# Patient Record
Sex: Female | Born: 1990 | Race: Black or African American | Hispanic: No | Marital: Married | Smoking: Former smoker
Health system: Southern US, Community
[De-identification: ages and names within clinical notes are randomized; demographics above are authoritative.]

## PROBLEM LIST (undated history)

## (undated) DIAGNOSIS — R42 Dizziness and giddiness: Secondary | ICD-10-CM

## (undated) DIAGNOSIS — O165 Unspecified maternal hypertension, complicating the puerperium: Secondary | ICD-10-CM

## (undated) DIAGNOSIS — A599 Trichomoniasis, unspecified: Secondary | ICD-10-CM

## (undated) DIAGNOSIS — IMO0001 Reserved for inherently not codable concepts without codable children: Secondary | ICD-10-CM

## (undated) DIAGNOSIS — Z87442 Personal history of urinary calculi: Secondary | ICD-10-CM

## (undated) DIAGNOSIS — R87629 Unspecified abnormal cytological findings in specimens from vagina: Secondary | ICD-10-CM

## (undated) DIAGNOSIS — F329 Major depressive disorder, single episode, unspecified: Secondary | ICD-10-CM

## (undated) DIAGNOSIS — K219 Gastro-esophageal reflux disease without esophagitis: Secondary | ICD-10-CM

## (undated) DIAGNOSIS — O23592 Infection of other part of genital tract in pregnancy, second trimester: Secondary | ICD-10-CM

## (undated) DIAGNOSIS — J45909 Unspecified asthma, uncomplicated: Secondary | ICD-10-CM

## (undated) DIAGNOSIS — D649 Anemia, unspecified: Secondary | ICD-10-CM

## (undated) DIAGNOSIS — O1405 Mild to moderate pre-eclampsia, complicating the puerperium: Secondary | ICD-10-CM

## (undated) DIAGNOSIS — R110 Nausea: Secondary | ICD-10-CM

## (undated) DIAGNOSIS — Z8619 Personal history of other infectious and parasitic diseases: Secondary | ICD-10-CM

## (undated) DIAGNOSIS — F17200 Nicotine dependence, unspecified, uncomplicated: Secondary | ICD-10-CM

## (undated) DIAGNOSIS — F32A Depression, unspecified: Secondary | ICD-10-CM

## (undated) DIAGNOSIS — A5901 Trichomonal vulvovaginitis: Secondary | ICD-10-CM

## (undated) DIAGNOSIS — I10 Essential (primary) hypertension: Secondary | ICD-10-CM

## (undated) HISTORY — DX: Unspecified maternal hypertension, complicating the puerperium: O16.5

## (undated) HISTORY — PX: ESOPHAGOGASTRODUODENOSCOPY ENDOSCOPY: SHX5814

## (undated) HISTORY — DX: Nicotine dependence, unspecified, uncomplicated: F17.200

## (undated) HISTORY — PX: DILATION AND CURETTAGE OF UTERUS: SHX78

## (undated) HISTORY — DX: Nausea: R11.0

## (undated) HISTORY — DX: Essential (primary) hypertension: I10

---
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
## 2008-06-05 ENCOUNTER — Emergency Department (HOSPITAL_COMMUNITY): Admission: EM | Admit: 2008-06-05 | Discharge: 2008-06-05 | Payer: Self-pay | Admitting: Emergency Medicine

## 2008-09-13 ENCOUNTER — Emergency Department (HOSPITAL_COMMUNITY): Admission: EM | Admit: 2008-09-13 | Discharge: 2008-09-13 | Payer: Self-pay | Admitting: Emergency Medicine

## 2008-11-13 ENCOUNTER — Emergency Department (HOSPITAL_COMMUNITY): Admission: EM | Admit: 2008-11-13 | Discharge: 2008-11-13 | Payer: Self-pay | Admitting: Emergency Medicine

## 2009-04-29 ENCOUNTER — Emergency Department (HOSPITAL_COMMUNITY): Admission: EM | Admit: 2009-04-29 | Discharge: 2009-04-30 | Payer: Self-pay | Admitting: Emergency Medicine

## 2009-07-18 ENCOUNTER — Emergency Department (HOSPITAL_COMMUNITY): Admission: EM | Admit: 2009-07-18 | Discharge: 2009-07-18 | Payer: Self-pay | Admitting: Emergency Medicine

## 2009-09-10 ENCOUNTER — Emergency Department (HOSPITAL_COMMUNITY): Admission: EM | Admit: 2009-09-10 | Discharge: 2009-09-11 | Payer: Self-pay | Admitting: Emergency Medicine

## 2010-01-09 ENCOUNTER — Emergency Department (HOSPITAL_COMMUNITY): Admission: EM | Admit: 2010-01-09 | Discharge: 2010-01-09 | Payer: Self-pay | Admitting: Emergency Medicine

## 2010-04-15 ENCOUNTER — Emergency Department (HOSPITAL_COMMUNITY): Admission: EM | Admit: 2010-04-15 | Discharge: 2010-04-15 | Payer: Self-pay | Admitting: Emergency Medicine

## 2010-04-22 ENCOUNTER — Emergency Department (HOSPITAL_COMMUNITY): Admission: EM | Admit: 2010-04-22 | Discharge: 2010-04-23 | Payer: Self-pay | Admitting: Emergency Medicine

## 2010-07-01 ENCOUNTER — Emergency Department (HOSPITAL_COMMUNITY): Admission: EM | Admit: 2010-07-01 | Discharge: 2010-07-01 | Payer: Self-pay | Admitting: Emergency Medicine

## 2010-08-12 ENCOUNTER — Emergency Department (HOSPITAL_COMMUNITY): Admission: EM | Admit: 2010-08-12 | Discharge: 2010-08-13 | Payer: Self-pay | Admitting: Emergency Medicine

## 2010-09-19 ENCOUNTER — Emergency Department (HOSPITAL_COMMUNITY)
Admission: EM | Admit: 2010-09-19 | Discharge: 2010-09-19 | Payer: Self-pay | Source: Home / Self Care | Admitting: Emergency Medicine

## 2010-10-29 ENCOUNTER — Emergency Department (HOSPITAL_COMMUNITY)
Admission: EM | Admit: 2010-10-29 | Discharge: 2010-10-29 | Payer: Self-pay | Source: Home / Self Care | Admitting: Emergency Medicine

## 2010-12-19 LAB — URINALYSIS, ROUTINE W REFLEX MICROSCOPIC
Hgb urine dipstick: NEGATIVE
Ketones, ur: NEGATIVE mg/dL
Nitrite: NEGATIVE
Specific Gravity, Urine: >1.03 — ABNORMAL HIGH (ref 1.005–1.030)
Urobilinogen, UA: 1 mg/dL (ref 0.0–1.0)
pH: 6.5 (ref 5.0–8.0)

## 2010-12-21 LAB — PREGNANCY, URINE: Preg Test, Ur: NEGATIVE

## 2010-12-21 LAB — URINE MICROSCOPIC-ADD ON

## 2010-12-21 LAB — URINALYSIS, ROUTINE W REFLEX MICROSCOPIC: Bilirubin Urine: NEGATIVE

## 2010-12-25 LAB — WET PREP, GENITAL: Yeast Wet Prep HPF POC: NONE SEEN

## 2010-12-25 LAB — URINALYSIS, ROUTINE W REFLEX MICROSCOPIC
Hgb urine dipstick: NEGATIVE
Protein, ur: NEGATIVE mg/dL
Urobilinogen, UA: 0.2 mg/dL (ref 0.0–1.0)

## 2010-12-25 LAB — GC/CHLAMYDIA PROBE AMP, GENITAL
Chlamydia, DNA Probe: NEGATIVE
GC Probe Amp, Genital: NEGATIVE

## 2010-12-28 ENCOUNTER — Emergency Department (HOSPITAL_COMMUNITY): Payer: Medicaid Other

## 2010-12-28 ENCOUNTER — Emergency Department (HOSPITAL_COMMUNITY)
Admission: EM | Admit: 2010-12-28 | Discharge: 2010-12-28 | Disposition: A | Discharge status: Home / Self Care | Payer: Medicaid Other | Attending: Emergency Medicine | Admitting: Emergency Medicine

## 2010-12-28 DIAGNOSIS — R51 Headache: Secondary | ICD-10-CM | POA: Insufficient documentation

## 2010-12-28 DIAGNOSIS — S0990XA Unspecified injury of head, initial encounter: Secondary | ICD-10-CM | POA: Insufficient documentation

## 2010-12-28 DIAGNOSIS — R071 Chest pain on breathing: Secondary | ICD-10-CM | POA: Insufficient documentation

## 2011-01-12 LAB — BASIC METABOLIC PANEL
BUN: 7 mg/dL (ref 6–23)
CO2: 27 mEq/L (ref 19–32)
Glucose, Bld: 94 mg/dL (ref 70–99)
Sodium: 141 mEq/L (ref 135–145)

## 2011-01-12 LAB — CBC
HCT: 24.3 % — ABNORMAL LOW (ref 36.0–46.0)
MCHC: 34.7 g/dL (ref 30.0–36.0)
Platelets: 279 10*3/uL (ref 150–400)
RDW: 14.9 % (ref 11.5–15.5)

## 2011-01-12 LAB — URINALYSIS, ROUTINE W REFLEX MICROSCOPIC
Ketones, ur: NEGATIVE mg/dL
Protein, ur: NEGATIVE mg/dL
Specific Gravity, Urine: 1.015 (ref 1.005–1.030)
Urobilinogen, UA: 1 mg/dL (ref 0.0–1.0)
pH: 7 (ref 5.0–8.0)

## 2011-01-12 LAB — DIFFERENTIAL
Basophils Absolute: 0 10*3/uL (ref 0.0–0.1)
Basophils Relative: 0 % (ref 0–1)
Eosinophils Absolute: 0 10*3/uL (ref 0.0–0.7)
Eosinophils Relative: 1 % (ref 0–5)
Monocytes Absolute: 0.5 10*3/uL (ref 0.1–1.0)

## 2011-01-21 LAB — CBC
Platelets: 197 10*3/uL (ref 150–400)
RDW: 14.1 % (ref 11.5–15.5)
WBC: 7.2 10*3/uL (ref 4.0–10.5)

## 2011-01-21 LAB — URINALYSIS, ROUTINE W REFLEX MICROSCOPIC
Ketones, ur: NEGATIVE mg/dL
Nitrite: NEGATIVE
Protein, ur: NEGATIVE mg/dL
Urobilinogen, UA: 0.2 mg/dL (ref 0.0–1.0)
pH: 6 (ref 5.0–8.0)

## 2011-01-21 LAB — DIFFERENTIAL
Basophils Absolute: 0 10*3/uL (ref 0.0–0.1)
Lymphocytes Relative: 11 % — ABNORMAL LOW (ref 12–46)
Lymphs Abs: 0.8 10*3/uL (ref 0.7–4.0)
Neutro Abs: 5.9 10*3/uL (ref 1.7–7.7)
Neutrophils Relative %: 82 % — ABNORMAL HIGH (ref 43–77)

## 2011-01-21 LAB — BASIC METABOLIC PANEL
BUN: 6 mg/dL (ref 6–23)
Calcium: 9.1 mg/dL (ref 8.4–10.5)
Creatinine, Ser: 0.47 mg/dL (ref 0.4–1.2)
GFR calc non Af Amer: >60 mL/min (ref >60)
Glucose, Bld: 88 mg/dL (ref 70–99)
Potassium: 3.4 mEq/L — ABNORMAL LOW (ref 3.5–5.1)

## 2011-02-20 ENCOUNTER — Ambulatory Visit (INDEPENDENT_AMBULATORY_CARE_PROVIDER_SITE_OTHER): Payer: Medicaid Other | Admitting: Gastroenterology

## 2011-02-20 ENCOUNTER — Encounter: Payer: Self-pay | Admitting: Gastroenterology

## 2011-02-20 DIAGNOSIS — K920 Hematemesis: Secondary | ICD-10-CM

## 2011-02-20 DIAGNOSIS — R1013 Epigastric pain: Secondary | ICD-10-CM

## 2011-02-20 MED ORDER — ESOMEPRAZOLE MAGNESIUM 40 MG PO CPDR: 40.0000 mg | DELAYED_RELEASE_CAPSULE | Freq: Every day | ORAL | Status: DC

## 2011-02-20 NOTE — Progress Notes (Signed)
Primary Care Physician:  Purcell Nails, MD  Primary Gastroenterologist:  Roetta Sessions, MD  Chief Complaint  Patient presents with  . Hematemesis    HPI:  Monica Cox is a 20 y.o. female here for further evaluation of intermittent hematemesis and epigastric pain. She states she had N/V throughout pregnancy and for four months afterwards. Son turns two in July. Last episode of hematemesis was 3 weeks ago. Moderate volume bright red. Epigastric burning frequently and worse with spicey foods. No dysphagia. BM about 1-2 per week (chronically). No melena, brbpr. Nexium for three days seemed to help stomach.  Current Outpatient Prescriptions  Medication Sig Dispense Refill  . Albuterol Sulfate (VENTOLIN HFA IN) Inhale into the lungs as needed.        Marland Kitchen amoxicillin (AMOXIL) 875 MG tablet Take 875 mg by mouth 2 (two) times daily. 875-125 mg       . Fluticasone-Salmeterol (ADVAIR DISKUS) 250-50 MCG/DOSE AEPB Inhale 1 puff into the lungs as needed.        Marland Kitchen esomeprazole (NEXIUM) 40 MG capsule Take 1 capsule (40 mg total) by mouth daily before breakfast.  30 capsule  3      Allergies as of 02/20/2011  . (No Known Allergies)    Past Medical History  Diagnosis Date  . Asthma     Past Surgical History  Procedure Date  . None     Family History  Problem Relation Age of Onset  . Ulcers Father   . Ulcers Paternal Grandmother   . Ulcers Paternal Aunt   . Liver disease Neg Hx   . Colon cancer Neg Hx     History   Social History  . Marital Status: Married    Spouse Name: N/A    Number of Children: 1  . Years of Education: N/A   Occupational History  . [employer]     respiratory therapy   Social History Main Topics  . Smoking status: Never Smoker   . Smokeless tobacco: Not on file  . Alcohol Use: No  . Drug Use: No  . Sexually Active: Not on file      ROS:  General: Negative for anorexia, weight loss, fever, chills, fatigue, weakness. Eyes: Negative for vision  changes.  ENT: Negative for hoarseness, difficulty swallowing , nasal congestion. CV: Negative for chest pain, angina, palpitations, dyspnea on exertion, peripheral edema.  Respiratory: Negative for dyspnea at rest, dyspnea on exertion, cough, sputum, wheezing.  GI: See history of present illness. GU:  Negative for dysuria, hematuria, urinary incontinence, urinary frequency, nocturnal urination.  MS: Negative for joint pain, low back pain.  Derm: Negative for rash or itching.  Neuro: Negative for weakness, abnormal sensation, seizure, frequent headaches, memory loss, confusion.  Psych: Negative for anxiety, depression, suicidal ideation, hallucinations.  Endo: Negative for unusual weight change.  Heme: Negative for bruising or bleeding. Allergy: Negative for rash or hives.    Physical Examination:  BP 111/72  Pulse 81  Temp(Src) 98.4 F (36.9 C) (Temporal)  Ht 5\' 3"  (1.6 m)  Wt 168 lb (76.204 kg)  BMI 29.76 kg/m2   General: Well-nourished, well-developed in no acute distress.  Head: Normocephalic, atraumatic.   Eyes: Conjunctiva pink, no icterus. Mouth: Oropharyngeal mucosa moist and pink , no lesions erythema or exudate. Neck: Supple without thyromegaly, masses, or lymphadenopathy.  Lungs: Clear to auscultation bilaterally.  Heart: Regular rate and rhythm, no murmurs rubs or gallops.  Abdomen: Bowel sounds are normal, moderate epigastric tenderness, nondistended, no hepatosplenomegaly or  masses, no abdominal bruits or    hernia , no rebound or guarding.   Extremities: No lower extremity edema.  Neuro: Alert and oriented x 4 , grossly normal neurologically.  Skin: Warm and dry, no rash or jaundice.   Psych: Alert and cooperative, normal mood and affect.

## 2011-02-20 NOTE — Progress Notes (Signed)
Cc to PCP 

## 2011-02-20 NOTE — Assessment & Plan Note (Signed)
Moderate volume hematemesis intermittently. Last time 3 weeks ago. Patient states her current hemoglobin was 12.8. She complains of epigastric pain associated with vomiting related to foods. Worse with spicy or greasy foods. Denies typical heartburn. Her symptoms are likely due to this resolved her reflux disease, gastritis, less likely peptic ulcer disease. Likely has hematemesis due to Mallory-Weiss tear. Cannot exclude underlying gallbladder disease as a cause of her intermittent nausea and vomiting however.   Recommend EGD for further evaluation. I have discussed the risks, alternatives, benefits with regards to but not limited to the risk of reaction to medication, bleeding, infection, perforation and the patient is agreeable to proceed. Written consent to be obtained. Will begin Nexium 40 mg daily. #20 samples provided as well as a prescription was sent to [facility].  Will request most recent labs done through Dr. Isidoro Donning office.

## 2011-02-21 NOTE — Op Note (Signed)
NAMEJYRA, LAGARES              ACCOUNT NO.:  [id number]   MEDICAL RECORD NO.:  [id number]          PATIENT TYPE:  AMB   LOCATION:  DAY                           FACILITY:  [facility]   PHYSICIAN:  Lazaro Arms, M.D.   DATE OF BIRTH:  12/07/1990   DATE OF PROCEDURE:  11/26/2006  DATE OF DISCHARGE:  11/26/2006                               OPERATIVE REPORT   PREOPERATIVE DIAGNOSIS:  1. Intrauterine pregnancy at nine weeks.  2. Missed abortion.   POSTOPERATIVE DIAGNOSIS:  1. Intrauterine pregnancy at nine weeks.  2. Missed abortion.   PROCEDURE:  Cervical dilation with uterine evacuation of a nine week  missed abortion.   SURGEON:  Lazaro Arms, M.D.   ANESTHESIA:  General endotracheal anesthesia.   FINDINGS:  The patient was seen in the office, had a nine weeks size  intrauterine pole, had had positive fetal heart activity before, but no  fetal heart rate today by ultrasound.  She requested a D&C.   DESCRIPTION OF PROCEDURE:  The patient was taken to the operating room  and placed in the supine position where she underwent general  endotracheal anesthesia.  She was then placed in the dorsal lithotomy  position and prepped and draped in the usual sterile fashion.  A  speculum was placed, the cervix was grasped, it was dilated serially to  allow passes of 9 curved suction curet.  Several passes were made.  A  good uterine cry was obtained.  All tissue was removed.  Bleeding was  appropriate.  She was awakened from anesthesia and taken to the recovery  room in good, stable condition.  All counts were correct x3.      Lazaro Arms, M.D.  Electronically Signed     LHE/MEDQ  D:  12/29/2006  T:  12/29/2006  Job:  [id number]

## 2011-02-25 ENCOUNTER — Ambulatory Visit (HOSPITAL_COMMUNITY)
Admission: RE | Admit: 2011-02-25 | Discharge: 2011-02-25 | Disposition: A | Discharge status: Home / Self Care | Payer: Medicaid Other | Source: Ambulatory Visit | Attending: Internal Medicine | Admitting: Internal Medicine

## 2011-02-25 ENCOUNTER — Encounter: Payer: Medicaid Other | Admitting: Internal Medicine

## 2011-02-25 DIAGNOSIS — K920 Hematemesis: Secondary | ICD-10-CM | POA: Insufficient documentation

## 2011-02-25 DIAGNOSIS — Z331 Pregnant state, incidental: Secondary | ICD-10-CM

## 2011-02-25 DIAGNOSIS — R1013 Epigastric pain: Secondary | ICD-10-CM | POA: Insufficient documentation

## 2011-02-25 LAB — PREGNANCY, URINE: Preg Test, Ur: NEGATIVE

## 2011-02-28 NOTE — Op Note (Signed)
  Monica Cox, Monica Cox             ACCOUNT NO.:  [id number]  MEDICAL RECORD NO.:  [id number]           PATIENT TYPE:  O  LOCATION:  DAYP                          FACILITY:  [facility]  PHYSICIAN:  R. Roetta Sessions, M.D. DATE OF BIRTH:  02/14/1991  DATE OF PROCEDURE:  02/25/2011 DATE OF DISCHARGE:                              OPERATIVE REPORT   INDICATIONS FOR PROCEDURE:  A 20 year old lady with intermittent epigastric pain and hematemesis and nausea and vomiting in pregnancy several months afterward and has continued intermittently, not much in the way being typical reflux symptoms.  She was started on Nexium 40 mg orally daily recently which has been associated with resolution of her symptoms.  She had normal hemoglobin recently.  No dysphagia, no nausea, vomiting, no abdominal pain whatsoever.  No odynophagia.  She feels well.  EGD is now being done to further evaluate her reported hematemesis.  Risks, benefits, limitations, alternatives, and imponderables have been reviewed and questions have been answered. Please see the documentation in the medical record.  PROCEDURE NOTE:  O2 saturation, blood pressure, pulse, and respirations were monitored throughout the entirety of the procedure.  CONSCIOUS SEDATION:  Versed 7 mg IV, Demerol 100 mg IV in divided doses.  INSTRUMENT:  Pentax video chip system.  FINDINGS:  Examination of the tubular esophagus revealed normal mucosa. EG junction easily traversed. Stomach:  Gastric cavity was emptied and insufflated well with air. Thorough examination of the gastric mucosa including retroflexed proximal stomach, esophagogastric junction demonstrated no abnormalities.  Pylorus was patent, easily traversed.  Examination of the bulb second, third portion revealed no abnormalities.  THERAPEUTIC/DIAGNOSTIC MANEUVERS PERFORMED:  None.  The patient tolerated the procedure well, was reactive in endoscopy.  IMPRESSION: 1. Normal esophagus and  stomach, D1 through D3. 2. Suspect trivial hematemesis.  Symptoms have resolved, at least temporally associated with a course of acid suppression therapy empirically.  I suspect her recent symptoms have been a somewhat atypical manifestation of GERD.  At any rate, findings on today's exam are very, very reassuring.  RECOMMENDATIONS: 1. Reflux literature provided Mr. Karl Ito. 2. Continue Nexium 40 mg orally daily for the next 3 months and then     attempt to taper.  No further GI evaluation warranted at this time.     Jonathon Bellows, M.D.     RMR/MEDQ  D:  02/25/2011  T:  02/26/2011  Job:  [id number]  cc:   Purcell Nails, MD Fax: [phone]  Electronically Signed by Lorrin Goodell M.D. on 02/28/2011 01:48:15 PM

## 2011-03-07 NOTE — Progress Notes (Signed)
agree

## 2011-03-10 LAB — CBC
HCT: 39 %
WBC: 6.1
platelet count: 266

## 2011-03-26 ENCOUNTER — Telehealth: Payer: Self-pay | Admitting: Gastroenterology

## 2011-03-26 NOTE — Telephone Encounter (Signed)
LMOM to call.

## 2011-03-26 NOTE — Telephone Encounter (Signed)
Pt informed

## 2011-03-26 NOTE — Telephone Encounter (Signed)
Message copied by Tiffany Kocher on Wed Mar 26, 2011  8:58 AM ------      Message from: Lavena Bullion      Created: Tue Mar 25, 2011  3:29 PM       Verlon Au, pt called and i informed of results and was going to forward to Crystal to schedule EGD (pt said it had not been scheduled). Then i saw she was seen here on 5/17/. Just she just need triage now? thx

## 2011-03-26 NOTE — Telephone Encounter (Signed)
I'm sorry. She already had her EGD on 02/25/2011. Per RMR, no further w/u needed. Continue Nexium.

## 2011-06-30 LAB — PREGNANCY, URINE: Preg Test, Ur: NEGATIVE

## 2011-07-10 LAB — URINALYSIS, ROUTINE W REFLEX MICROSCOPIC
Nitrite: NEGATIVE
Specific Gravity, Urine: >1.03 — ABNORMAL HIGH (ref 1.005–1.030)
Urobilinogen, UA: 0.2 mg/dL (ref 0.0–1.0)
pH: 5.5 (ref 5.0–8.0)

## 2011-07-10 LAB — CBC
MCHC: 34.4 g/dL (ref 30.0–36.0)
MCV: 88.3 fL (ref 78.0–100.0)
Platelets: 231 10*3/uL (ref 150–400)
RBC: 4.22 MIL/uL (ref 3.87–5.11)

## 2011-07-10 LAB — ABO/RH: ABO/RH(D): O NEG

## 2011-07-10 LAB — DIFFERENTIAL
Basophils Absolute: 0 10*3/uL (ref 0.0–0.1)
Basophils Relative: 1 % (ref 0–1)
Eosinophils Absolute: 0 10*3/uL (ref 0.0–0.7)
Neutro Abs: 3.6 10*3/uL (ref 1.7–7.7)
Neutrophils Relative %: 71 % (ref 43–77)

## 2011-07-10 LAB — RH IMMUNE GLOBULIN WORKUP (NOT WOMEN'S HOSP)
ABO/RH(D): O NEG
Antibody Screen: NEGATIVE

## 2011-07-10 LAB — URINE MICROSCOPIC-ADD ON

## 2011-08-13 ENCOUNTER — Emergency Department (HOSPITAL_COMMUNITY)
Admission: EM | Admit: 2011-08-13 | Discharge: 2011-08-13 | Disposition: A | Discharge status: Home Health | Payer: Medicaid Other | Attending: Emergency Medicine | Admitting: Emergency Medicine

## 2011-08-13 ENCOUNTER — Encounter (HOSPITAL_COMMUNITY): Payer: Self-pay | Admitting: Emergency Medicine

## 2011-08-13 DIAGNOSIS — R112 Nausea with vomiting, unspecified: Secondary | ICD-10-CM | POA: Insufficient documentation

## 2011-08-13 DIAGNOSIS — R05 Cough: Secondary | ICD-10-CM | POA: Insufficient documentation

## 2011-08-13 DIAGNOSIS — R059 Cough, unspecified: Secondary | ICD-10-CM | POA: Insufficient documentation

## 2011-08-13 DIAGNOSIS — R197 Diarrhea, unspecified: Secondary | ICD-10-CM | POA: Insufficient documentation

## 2011-08-13 MED ORDER — MORPHINE SULFATE 4 MG/ML IJ SOLN
4.0000 mg | Freq: Once | INTRAMUSCULAR | Status: AC
  Administered 2011-08-13: 4 mg via INTRAVENOUS
  Filled 2011-08-13: qty 1

## 2011-08-13 MED ORDER — SODIUM CHLORIDE 0.9 % IV BOLUS (SEPSIS)
1000.0000 mL | Freq: Once | INTRAVENOUS | Status: AC
  Administered 2011-08-13: 1000 mL via INTRAVENOUS

## 2011-08-13 MED ORDER — OXYCODONE-ACETAMINOPHEN 5-325 MG PO TABS
1.0000 | ORAL_TABLET | Freq: Once | ORAL | Status: AC
  Administered 2011-08-13: 1 via ORAL
  Filled 2011-08-13: qty 1

## 2011-08-13 MED ORDER — ONDANSETRON 8 MG PO TBDP
8.0000 mg | ORAL_TABLET | Freq: Once | ORAL | Status: AC
  Administered 2011-08-13: 8 mg via ORAL
  Filled 2011-08-13: qty 1

## 2011-08-13 MED ORDER — ONDANSETRON 8 MG PO TBDP: 8.0000 mg | ORAL_TABLET | Freq: Three times a day (TID) | ORAL | Status: AC | PRN

## 2011-08-13 MED ORDER — ONDANSETRON HCL 4 MG/2ML IJ SOLN
4.0000 mg | Freq: Once | INTRAMUSCULAR | Status: AC
  Administered 2011-08-13: 4 mg via INTRAVENOUS
  Filled 2011-08-13: qty 2

## 2011-08-13 NOTE — ED Notes (Signed)
Pt c/o pain in upper mid and RUQ since this am with n/v/d.

## 2011-08-13 NOTE — ED Notes (Signed)
Pt c/o flu-like sx since 0800 this am.

## 2011-08-13 NOTE — ED Provider Notes (Signed)
Scribed for Monica Gaskins, MD, the patient was seen in room [facility]. This chart was scribed by [employer]. The patient's care started at 18:03  CSN: [id number] Arrival date & time: 08/13/2011  5:57 PM   First MD Initiated Contact with Patient 08/13/11 1803      Chief Complaint  Patient presents with  . Weakness  . Chills  . Emesis  . Diarrhea   Patient is a 20 y.o. female presenting with vomiting and diarrhea. The history is provided by the patient.  Emesis  This is a new problem. The current episode started 6 to 12 hours ago. The problem occurs 2 to 4 times per day. The problem has been gradually improving. The emesis has an appearance of stomach contents. The maximum temperature recorded prior to her arrival was 102 to 102.9 F. Associated symptoms include abdominal pain, chills, cough, diarrhea, a fever and myalgias.  Diarrhea The primary symptoms include fever, abdominal pain, vomiting, diarrhea and myalgias.  The illness is also significant for chills.   Monica Cox is a 20 y.o. female who presents to the Emergency Department complaining of Emesis and diarrhea with associated weakness and chills, onset 08:00 today. Patient states that she woke up this morning, had diarrhea, vomiting and cold chills. Reports 6 episodes of vomiting with 4 episodes of diarrhea. Patient reports "a dime sized" amount of blood in her vomit. Patient reports abdominal cramping. Denies any long trips. She also reports some cough this morning and rhinorrhea. She denies taking any tylenol or Ibuprofen for alleviation of symptoms.  No recent abx   Past Medical History  Diagnosis Date  . Asthma     Past Surgical History  Procedure Date  . None     Family History  Problem Relation Age of Onset  . Ulcers Father   . Ulcers Paternal Grandmother   . Ulcers Paternal Aunt   . Liver disease Neg Hx   . Colon cancer Neg Hx     History  Substance Use Topics  . Smoking status: Never Smoker     . Smokeless tobacco: Not on file  . Alcohol Use: No    OB History    Grav Para Term Preterm Abortions TAB SAB Ect Mult Living                  Review of Systems  Constitutional: Positive for fever and chills.  Respiratory: Positive for cough.   Gastrointestinal: Positive for vomiting, abdominal pain and diarrhea.  Musculoskeletal: Positive for myalgias.  All other systems reviewed and are negative.    Allergies  Review of patient's allergies indicates no known allergies.  Home Medications   Current Outpatient Rx  Name Route Sig Dispense Refill  . VENTOLIN HFA IN Inhalation Inhale into the lungs as needed.      . AMOXICILLIN 875 MG PO TABS Oral Take 875 mg by mouth 2 (two) times daily. 875-125 mg     . ESOMEPRAZOLE MAGNESIUM 40 MG PO CPDR Oral Take 1 capsule (40 mg total) by mouth daily before breakfast. 30 capsule 3  . FLUTICASONE-SALMETEROL 250-50 MCG/DOSE IN AEPB Inhalation Inhale 1 puff into the lungs as needed.        BP 115/59  Pulse 118  Temp(Src) 98.7 F (37.1 C) (Oral)  Resp 20  Ht 5\' 3"  (1.6 m)  Wt 185 lb (83.915 kg)  BMI 32.77 kg/m2  SpO2 100%  LMP 06/30/2011  Physical Exam CONSTITUTIONAL: Well developed/well nourished HEAD AND FACE: Normocephalic/atraumatic  EYES: EOMI/PERRL ENMT: Mucous membranes moist NECK: supple no meningeal signs CV: S1/S2 noted, no murmurs/rubs/gallops noted LUNGS: Lungs are clear to auscultation bilaterally, no apparent distress ABDOMEN: soft, nontender, no rebound or guarding GU:no cva tenderness NEURO: Pt is awake/alert, moves all extremitiesx4 EXTREMITIES: pulses normal, full ROM SKIN: warm, color normal PSYCH: no abnormalities of mood noted   ED Course  Procedures  DIAGNOSTIC STUDIES: Oxygen Saturation is 100% on room air, normal by my interpretation.    COORDINATION OF CARE: 18:12 - EDP examined patient at bedside and ordered the following  Pt improved after IV fluids, talking on phone, no distress, taking  PO Stable for d/c Suspicion for acute abd process is low      MDM: Nursing notes reviewed and considered in documentation    Scribe Attestation I personally performed the services described in this documentation, which was scribed in my presence. The recorded information has been reviewed and considered.        Monica Gaskins, MD 08/14/11 Lyda Jester

## 2011-08-13 NOTE — ED Notes (Signed)
Pt states nausea is better Pain is in head and R upper quad

## 2011-08-13 NOTE — ED Notes (Signed)
Received report; Upon assessment pt reports vomiting at 1850 along with diarrhea episode Will notify MD

## 2011-09-15 ENCOUNTER — Encounter (HOSPITAL_COMMUNITY): Payer: Self-pay | Admitting: *Deleted

## 2011-09-15 ENCOUNTER — Emergency Department (HOSPITAL_COMMUNITY)
Admission: EM | Admit: 2011-09-15 | Discharge: 2011-09-15 | Disposition: A | Discharge status: Home / Self Care | Payer: Medicaid Other | Attending: Emergency Medicine | Admitting: Emergency Medicine

## 2011-09-15 ENCOUNTER — Other Ambulatory Visit: Payer: Self-pay

## 2011-09-15 ENCOUNTER — Emergency Department (HOSPITAL_COMMUNITY): Payer: Medicaid Other

## 2011-09-15 DIAGNOSIS — J45909 Unspecified asthma, uncomplicated: Secondary | ICD-10-CM | POA: Insufficient documentation

## 2011-09-15 MED ORDER — OXYCODONE-ACETAMINOPHEN 5-325 MG PO TABS
2.0000 | ORAL_TABLET | Freq: Once | ORAL | Status: AC
  Administered 2011-09-15: 2 via ORAL
  Filled 2011-09-15: qty 2

## 2011-09-15 MED ORDER — ALBUTEROL SULFATE (5 MG/ML) 0.5% IN NEBU
5.0000 mg | INHALATION_SOLUTION | Freq: Once | RESPIRATORY_TRACT | Status: AC
  Administered 2011-09-15: 5 mg via RESPIRATORY_TRACT
  Filled 2011-09-15: qty 1

## 2011-09-15 MED ORDER — IPRATROPIUM BROMIDE 0.02 % IN SOLN
0.5000 mg | Freq: Once | RESPIRATORY_TRACT | Status: AC
  Administered 2011-09-15: 0.5 mg via RESPIRATORY_TRACT
  Filled 2011-09-15: qty 2.5

## 2011-09-15 NOTE — ED Notes (Signed)
Pt stable at discharge with no pain

## 2011-09-15 NOTE — ED Provider Notes (Signed)
History     CSN: [id number] Arrival date & time: 09/15/2011  2:05 AM   First MD Initiated Contact with Patient 09/15/11 [phone]      Chief Complaint  Patient presents with  . Shortness of Breath     Patient is a 20 y.o. female presenting with shortness of breath. The history is provided by the patient.  Shortness of Breath  Associated symptoms include shortness of breath.   the patient reports she became short of breath with chest tightness right immediately after a severe argument with her boyfriend.  She continues to complain of chest tightness at this time.  She does have an albuterol inhaler at home which she has had for bronchitis before in the past report she tried to take this without improvement and thus was brought to the emergency department for evaluation.  Prior history of DVT or pulmonary embolus.  No recent travel or surgery.  She denies smoking.  She denies estrogen use.  She was tearful in triage.  There is a family history of what sounds like reactive airway disease and the patient does have a history of eczema.  She is otherwise a healthy 20 year old female.  She's had no recent cough or congestion.  She denies fever and chills.  She denies abdominal pain nausea vomiting diarrhea.  Nothing worsens her symptoms.  Nothing improves her symptoms.  Her symptoms are constant  Past Medical History  Diagnosis Date  . Asthma     Past Surgical History  Procedure Date  . None     Family History  Problem Relation Age of Onset  . Ulcers Father   . Ulcers Paternal Grandmother   . Ulcers Paternal Aunt   . Liver disease Neg Hx   . Colon cancer Neg Hx     History  Substance Use Topics  . Smoking status: Never Smoker   . Smokeless tobacco: Not on file  . Alcohol Use: No    OB History    Grav Para Term Preterm Abortions TAB SAB Ect Mult Living                  Review of Systems  Respiratory: Positive for shortness of breath.   All other systems reviewed and are  negative.    Allergies  Review of patient's allergies indicates no known allergies.  Home Medications   Current Outpatient Rx  Name Route Sig Dispense Refill  . VENTOLIN HFA IN Inhalation Inhale 2 puffs into the lungs 2 (two) times daily as needed. For shortness of breath    . ESOMEPRAZOLE MAGNESIUM 40 MG PO CPDR Oral Take 1 capsule (40 mg total) by mouth daily before breakfast. 30 capsule 3  . FLUTICASONE-SALMETEROL 250-50 MCG/DOSE IN AEPB Inhalation Inhale 1 puff into the lungs as needed.        BP 116/74  Pulse 99  Temp 98.4 F (36.9 C)  Resp 28  Ht 5\' 3"  (1.6 m)  Wt 180 lb (81.647 kg)  BMI 31.89 kg/m2  SpO2 99%  LMP 09/15/2011  Physical Exam  Nursing note and vitals reviewed. Constitutional: She is oriented to person, place, and time. She appears well-developed and well-nourished. No distress.  HENT:  Head: Normocephalic and atraumatic.  Eyes: EOM are normal.  Neck: Normal range of motion.  Cardiovascular: Normal rate, regular rhythm and normal heart sounds.   Pulmonary/Chest: Effort normal.       Mild decreased breath sounds bilaterally without overt wheezing  Abdominal: Soft. She exhibits no distension.  There is no tenderness.  Musculoskeletal: Normal range of motion.  Neurological: She is alert and oriented to person, place, and time.  Skin: Skin is warm and dry.  Psychiatric: She has a normal mood and affect. Judgment normal.    ED Course  Procedures (including critical care time)   Date: 09/15/2011  Rate: 81  Rhythm: normal sinus rhythm  QRS Axis: normal  Intervals: normal  ST/T Wave abnormalities: normal  Conduction Disutrbances:none  Narrative Interpretation:   Old EKG Reviewed: No significant changes noted     Labs Reviewed - No data to display Dg Chest 2 View  09/15/2011  *RADIOLOGY REPORT*  Clinical Data: Chest pain and shortness of breath  CHEST - 2 VIEW  Comparison: 12/28/2010  Findings: The heart size and pulmonary vascularity are  normal. The lungs appear clear and expanded without focal air space disease or consolidation. No blunting of the costophrenic angles.  Scattered calcified granulomas.  No pneumothorax.  No significant change since prior study.  IMPRESSION: No  evidence of active pulmonary disease.  Original Report Authenticated By: Marlon Pel, M.D.     1. Reactive airway disease       MDM  EKG and chest x-ray to be obtained.  We'll give the patient a dose of albuterol nebulized to see if that helps.  Her symptoms sound more suggestive of reactive airway disease and negative straight panic attack  6:06 AM The patient feels much better at this time.  She has albuterol at home which she will use 2 puffs every 4 hours x2 days and every 4 hours when necessary        Lyanne Co, MD 09/15/11 [phone]

## 2011-09-15 NOTE — ED Notes (Signed)
Pt was arguing with a friend and began feeling like she was having a panic attack. Pt tearful and sob in triage resp 28

## 2011-09-15 NOTE — ED Notes (Signed)
Tremors noted; pt teary eyed

## 2011-10-22 ENCOUNTER — Encounter (HOSPITAL_COMMUNITY): Payer: Self-pay | Admitting: *Deleted

## 2011-10-22 ENCOUNTER — Emergency Department (HOSPITAL_COMMUNITY)
Admission: EM | Admit: 2011-10-22 | Discharge: 2011-10-22 | Disposition: A | Discharge status: Home / Self Care | Payer: Medicaid Other | Attending: Emergency Medicine | Admitting: Emergency Medicine

## 2011-10-22 DIAGNOSIS — R220 Localized swelling, mass and lump, head: Secondary | ICD-10-CM | POA: Insufficient documentation

## 2011-10-22 DIAGNOSIS — J3489 Other specified disorders of nose and nasal sinuses: Secondary | ICD-10-CM | POA: Insufficient documentation

## 2011-10-22 DIAGNOSIS — R070 Pain in throat: Secondary | ICD-10-CM | POA: Insufficient documentation

## 2011-10-22 DIAGNOSIS — J019 Acute sinusitis, unspecified: Secondary | ICD-10-CM | POA: Insufficient documentation

## 2011-10-22 DIAGNOSIS — R040 Epistaxis: Secondary | ICD-10-CM | POA: Insufficient documentation

## 2011-10-22 DIAGNOSIS — J45909 Unspecified asthma, uncomplicated: Secondary | ICD-10-CM | POA: Insufficient documentation

## 2011-10-22 DIAGNOSIS — K219 Gastro-esophageal reflux disease without esophagitis: Secondary | ICD-10-CM | POA: Insufficient documentation

## 2011-10-22 DIAGNOSIS — R51 Headache: Secondary | ICD-10-CM | POA: Insufficient documentation

## 2011-10-22 DIAGNOSIS — R221 Localized swelling, mass and lump, neck: Secondary | ICD-10-CM | POA: Insufficient documentation

## 2011-10-22 HISTORY — DX: Reserved for inherently not codable concepts without codable children: IMO0001

## 2011-10-22 HISTORY — DX: Gastro-esophageal reflux disease without esophagitis: K21.9

## 2011-10-22 MED ORDER — AMOXICILLIN 500 MG PO CAPS: 500.0000 mg | ORAL_CAPSULE | Freq: Three times a day (TID) | ORAL | Status: AC

## 2011-10-22 MED ORDER — AMOXICILLIN 250 MG PO CAPS
500.0000 mg | ORAL_CAPSULE | Freq: Once | ORAL | Status: AC
  Administered 2011-10-22: 500 mg via ORAL
  Filled 2011-10-22: qty 2

## 2011-10-22 MED ORDER — PSEUDOEPHEDRINE HCL 60 MG PO TABS: 60.0000 mg | ORAL_TABLET | ORAL | Status: AC | PRN

## 2011-10-22 NOTE — ED Notes (Signed)
Sore throat ,fever,Seen by Dr Fransico Him today and dx with ?viral infection

## 2011-10-22 NOTE — ED Notes (Signed)
Pt states woke with sore throat this morning, has had fever throughout the day and pain in her rt temple.  Pt reports seeing Dr Fransico Him this afternoon and was prescribed a steroid. Pt states did not feel better after taking 1 dose of steroids and "the family aunt is a Engineer, civil (consulting) and told me steroids was not appropriate for my symptoms so I decided to come here". Pt's 23 yr old son has same symptoms with a drs appt tomorrow. Pt denies having a flu shot.

## 2011-10-23 NOTE — ED Provider Notes (Signed)
History     CSN: [id number]  Arrival date & time 10/22/11  1953   First MD Initiated Contact with Patient 10/22/11 2036      Chief Complaint  Patient presents with  . Sore Throat    (Consider location/radiation/quality/duration/timing/severity/associated sxs/prior treatment) Patient is a 21 y.o. female presenting with URI. The history is provided by the patient.  URI The primary symptoms include headaches and sore throat. Primary symptoms do not include fever, cough, abdominal pain, nausea, arthralgias or rash. Primary symptoms comment: nasal congestion,  bloody nasal discharge and facial pain.  Also reports her cheeks were swollen earlier today The current episode started yesterday. This is a new problem. The problem has not changed (Patient saw her pcp today and was placed on prednisone 10 mg daily,  has taken todays dose with no improvementt) since onset. The headache is not associated with weakness.  The sore throat is not accompanied by trouble swallowing.  Associated with: Has been around several childen with colds. Symptoms associated with the illness include congestion and rhinorrhea. The following treatments were addressed: Acetaminophen was not tried. A decongestant was not tried. Aspirin was not tried.    Past Medical History  Diagnosis Date  . Asthma   . Reflux     Past Surgical History  Procedure Date  . None     Family History  Problem Relation Age of Onset  . Ulcers Father   . Ulcers Paternal Grandmother   . Ulcers Paternal Aunt   . Liver disease Neg Hx   . Colon cancer Neg Hx     History  Substance Use Topics  . Smoking status: Never Smoker   . Smokeless tobacco: Not on file  . Alcohol Use: No    OB History    Grav Para Term Preterm Abortions TAB SAB Ect Mult Living                  Review of Systems  Constitutional: Negative for fever.  HENT: Positive for nosebleeds, congestion, sore throat, facial swelling and rhinorrhea. Negative for  trouble swallowing and neck pain.   Eyes: Negative.   Respiratory: Negative for cough, chest tightness and shortness of breath.   Cardiovascular: Negative for chest pain.  Gastrointestinal: Negative for nausea and abdominal pain.  Genitourinary: Negative.   Musculoskeletal: Negative for joint swelling and arthralgias.  Skin: Negative.  Negative for rash and wound.  Neurological: Positive for headaches. Negative for dizziness, weakness, light-headedness and numbness.  Hematological: Negative.   Psychiatric/Behavioral: Negative.     Allergies  Review of patient's allergies indicates no known allergies.  Home Medications   Current Outpatient Rx  Name Route Sig Dispense Refill  . VENTOLIN HFA IN Inhalation Inhale 2 puffs into the lungs 2 (two) times daily as needed. For shortness of breath    . ESOMEPRAZOLE MAGNESIUM 40 MG PO CPDR Oral Take 1 capsule (40 mg total) by mouth daily before breakfast. 30 capsule 3  . FLUTICASONE-SALMETEROL 250-50 MCG/DOSE IN AEPB Inhalation Inhale 1 puff into the lungs as needed.      . AMOXICILLIN 500 MG PO CAPS Oral Take 1 capsule (500 mg total) by mouth 3 (three) times daily. 30 capsule 0  . PSEUDOEPHEDRINE HCL 60 MG PO TABS Oral Take 1 tablet (60 mg total) by mouth every 4 (four) hours as needed for congestion. 30 tablet 0    BP 117/59  Pulse 106  Temp 100.1 F (37.8 C)  Resp 18  Wt 179 lb (81.194  kg)  SpO2 100%  LMP 10/16/2011  Physical Exam  Nursing note and vitals reviewed. Constitutional: She is oriented to person, place, and time. She appears well-developed and well-nourished.  HENT:  Head: Normocephalic and atraumatic.  Right Ear: External ear normal.  Left Ear: External ear normal.  Nose: Mucosal edema and rhinorrhea present. Right sinus exhibits maxillary sinus tenderness. Left sinus exhibits maxillary sinus tenderness.  Eyes: Conjunctivae are normal.  Neck: Normal range of motion. No thyromegaly present.  Cardiovascular: Normal rate,  regular rhythm, normal heart sounds and intact distal pulses.   Pulmonary/Chest: Effort normal and breath sounds normal. No stridor. She has no wheezes.  Abdominal: Soft. Bowel sounds are normal. There is no tenderness.  Musculoskeletal: Normal range of motion.  Lymphadenopathy:    She has no cervical adenopathy.  Neurological: She is alert and oriented to person, place, and time.  Skin: Skin is warm and dry.  Psychiatric: She has a normal mood and affect.    ED Course  Procedures (including critical care time)  Labs Reviewed - No data to display No results found.   1. Sinusitis acute       MDM          Candis Musa, PA 10/23/11 1212

## 2011-10-23 NOTE — ED Provider Notes (Signed)
Medical screening examination/treatment/procedure(s) were performed by non-physician practitioner and as supervising physician I was immediately available for consultation/collaboration.   Stephanie Mcglone, MD 10/23/11 1222 

## 2012-01-18 ENCOUNTER — Encounter (HOSPITAL_COMMUNITY): Payer: Self-pay

## 2012-01-18 ENCOUNTER — Emergency Department (HOSPITAL_COMMUNITY)
Admission: EM | Admit: 2012-01-18 | Discharge: 2012-01-18 | Disposition: A | Discharge status: Home / Self Care | Payer: Medicaid Other | Attending: Emergency Medicine | Admitting: Emergency Medicine

## 2012-01-18 ENCOUNTER — Emergency Department (HOSPITAL_COMMUNITY): Payer: Medicaid Other

## 2012-01-18 DIAGNOSIS — R509 Fever, unspecified: Secondary | ICD-10-CM | POA: Insufficient documentation

## 2012-01-18 DIAGNOSIS — R51 Headache: Secondary | ICD-10-CM | POA: Insufficient documentation

## 2012-01-18 DIAGNOSIS — IMO0001 Reserved for inherently not codable concepts without codable children: Secondary | ICD-10-CM | POA: Insufficient documentation

## 2012-01-18 DIAGNOSIS — J40 Bronchitis, not specified as acute or chronic: Secondary | ICD-10-CM

## 2012-01-18 DIAGNOSIS — J029 Acute pharyngitis, unspecified: Secondary | ICD-10-CM | POA: Insufficient documentation

## 2012-01-18 DIAGNOSIS — R059 Cough, unspecified: Secondary | ICD-10-CM | POA: Insufficient documentation

## 2012-01-18 DIAGNOSIS — J45909 Unspecified asthma, uncomplicated: Secondary | ICD-10-CM | POA: Insufficient documentation

## 2012-01-18 DIAGNOSIS — J3489 Other specified disorders of nose and nasal sinuses: Secondary | ICD-10-CM | POA: Insufficient documentation

## 2012-01-18 DIAGNOSIS — R1110 Vomiting, unspecified: Secondary | ICD-10-CM | POA: Insufficient documentation

## 2012-01-18 DIAGNOSIS — R05 Cough: Secondary | ICD-10-CM | POA: Insufficient documentation

## 2012-01-18 MED ORDER — ALBUTEROL SULFATE HFA 108 (90 BASE) MCG/ACT IN AERS: 2.0000 | INHALATION_SPRAY | RESPIRATORY_TRACT | Status: DC | PRN

## 2012-01-18 MED ORDER — IBUPROFEN 800 MG PO TABS
800.0000 mg | ORAL_TABLET | Freq: Once | ORAL | Status: AC
  Administered 2012-01-18: 800 mg via ORAL
  Filled 2012-01-18: qty 1

## 2012-01-18 MED ORDER — AZITHROMYCIN 250 MG PO TABS: 250.0000 mg | ORAL_TABLET | Freq: Every day | ORAL | Status: AC

## 2012-01-18 NOTE — ED Notes (Signed)
Pt presents with fever, sore throat, headache, sweating, and pt vomited x 1 yesterday.

## 2012-01-18 NOTE — Discharge Instructions (Signed)

## 2012-01-18 NOTE — ED Provider Notes (Signed)
History   This chart was scribed for Glynn Octave, MD scribed by Magnus Sinning. The patient was seen in room APA10/[facility]10 seen at 13:47.     CSN: [id number]  Arrival date & time 01/18/12  1316   First MD Initiated Contact with Patient 01/18/12 1337      Chief Complaint  Patient presents with  . Fever  . Sore Throat  . Headache  . Nasal Congestion    (Consider location/radiation/quality/duration/timing/severity/associated sxs/prior treatment) HPI Monica Cox is a 21 y.o. female who presents to the Emergency Department complaining of constant moderate subjective fever with associated mylagias, ST,vomiting, productive cough, onset three days. Also notes prior abd pain, but says it has been resolved. Reports that she has not eaten today.Took Nyquil two nights ago with mild improvement. Sick contact exposures at home with recent viral infections. Denies CP, rash, or any other medical problems  Past Medical History  Diagnosis Date  . Asthma   . Reflux     Past Surgical History  Procedure Date  . None     Family History  Problem Relation Age of Onset  . Ulcers Father   . Ulcers Paternal Grandmother   . Ulcers Paternal Aunt   . Liver disease Neg Hx   . Colon cancer Neg Hx     History  Substance Use Topics  . Smoking status: Never Smoker   . Smokeless tobacco: Not on file  . Alcohol Use: No   Review of Systems  All other systems reviewed and are negative.   10 Systems reviewed and are negative for acute change except as noted in the HPI. Allergies  Review of patient's allergies indicates no known allergies.  Home Medications   Current Outpatient Rx  Name Route Sig Dispense Refill  . VENTOLIN HFA IN Inhalation Inhale 2 puffs into the lungs 2 (two) times daily as needed. For shortness of breath    . FLUTICASONE-SALMETEROL 250-50 MCG/DOSE IN AEPB Inhalation Inhale 1 puff into the lungs daily as needed. FOR SHORTNESS OF BREATH    .  PSEUDOEPH-DOXYLAMINE-DM-APAP 60-7.03-04-999 MG/30ML PO LIQD Oral Take 30 mLs by mouth at bedtime as needed. FOR COLD SYMPTOMS    . ALBUTEROL SULFATE HFA 108 (90 BASE) MCG/ACT IN AERS Inhalation Inhale 2 puffs into the lungs every 4 (four) hours as needed for wheezing. 1 Inhaler 0  . AZITHROMYCIN 250 MG PO TABS Oral Take 1 tablet (250 mg total) by mouth daily. Take first 2 tablets together, then 1 every day until finished. 6 tablet 0    BP 111/60  Pulse 86  Temp(Src) 97.6 F (36.4 C) (Oral)  Resp 20  Ht 5\' 3"  (1.6 m)  Wt 178 lb (80.74 kg)  BMI 31.53 kg/m2  SpO2 100%  LMP 01/13/2012  Physical Exam  Nursing note and vitals reviewed. Constitutional: She is oriented to person, place, and time. She appears well-developed and well-nourished. No distress.  HENT:  Head: Normocephalic and atraumatic.       Mild oropharyngeal erythema  No sinus tenderness  Eyes: EOM are normal. Pupils are equal, round, and reactive to light.  Neck: Neck supple. No tracheal deviation present.       No meningismus     Cardiovascular: Normal rate.   Pulmonary/Chest: Effort normal. No respiratory distress. She has no wheezes. She has no rales.  Abdominal: Soft. She exhibits no distension.  Musculoskeletal: Normal range of motion. She exhibits no edema.  Neurological: She is alert and oriented to person, place, and time.  No sensory deficit.  Skin: Skin is warm and dry.  Psychiatric: She has a normal mood and affect. Her behavior is normal.    ED Course  Procedures (including critical care time) DIAGNOSTIC STUDIES: Oxygen Saturation is 100% on room air, normal by my interpretation.    COORDINATION OF CARE: Medication Orders 1400:ADVIL tablet 800 mg Once    Labs Reviewed  RAPID STREP SCREEN   Dg Chest 2 View  01/18/2012  *RADIOLOGY REPORT*  Clinical Data: Cough, fever and congestion.  CHEST - 2 VIEW  Comparison: 09/15/2011  Findings: The cardiomediastinal silhouette is unremarkable. The lungs are  clear. There is no evidence of focal airspace disease, pulmonary edema, suspicious pulmonary nodule/mass, pleural effusion, or pneumothorax. No acute bony abnormalities are identified.  IMPRESSION: No evidence of active cardiopulmonary disease.  Original Report Authenticated By: Rosendo Gros, M.D.     1. Bronchitis       MDM  Subjective fevers, cough, sore throat, headache, body aches. Vitals stable, lungs clear.  Chest x-ray clear. Treat for bronchitis followup with PCP  Medical screening examination/treatment/procedure(s) were performed by non-physician practitioner and as supervising physician I was immediately available for consultation/collaboration.       Glynn Octave, MD 01/18/12 [phone]

## 2012-01-18 NOTE — ED Notes (Signed)
Water given  

## 2012-09-27 ENCOUNTER — Emergency Department (HOSPITAL_COMMUNITY)
Admission: EM | Admit: 2012-09-27 | Discharge: 2012-09-28 | Disposition: A | Discharge status: Home / Self Care | Payer: Medicaid Other | Attending: Emergency Medicine | Admitting: Emergency Medicine

## 2012-09-27 ENCOUNTER — Encounter (HOSPITAL_COMMUNITY): Payer: Self-pay | Admitting: *Deleted

## 2012-09-27 DIAGNOSIS — R112 Nausea with vomiting, unspecified: Secondary | ICD-10-CM | POA: Insufficient documentation

## 2012-09-27 DIAGNOSIS — J45909 Unspecified asthma, uncomplicated: Secondary | ICD-10-CM | POA: Insufficient documentation

## 2012-09-27 DIAGNOSIS — Z8719 Personal history of other diseases of the digestive system: Secondary | ICD-10-CM | POA: Insufficient documentation

## 2012-09-27 DIAGNOSIS — Z79899 Other long term (current) drug therapy: Secondary | ICD-10-CM | POA: Insufficient documentation

## 2012-09-27 MED ORDER — PREDNISONE 50 MG PO TABS
60.0000 mg | ORAL_TABLET | Freq: Once | ORAL | Status: AC
  Administered 2012-09-27: 60 mg via ORAL
  Filled 2012-09-27: qty 1

## 2012-09-27 MED ORDER — IPRATROPIUM BROMIDE 0.02 % IN SOLN
0.5000 mg | Freq: Once | RESPIRATORY_TRACT | Status: AC
  Administered 2012-09-27: 0.5 mg via RESPIRATORY_TRACT
  Filled 2012-09-27: qty 2.5

## 2012-09-27 MED ORDER — ALBUTEROL SULFATE (5 MG/ML) 0.5% IN NEBU
2.5000 mg | INHALATION_SOLUTION | Freq: Once | RESPIRATORY_TRACT | Status: AC
  Administered 2012-09-27: 2.5 mg via RESPIRATORY_TRACT
  Filled 2012-09-27: qty 0.5

## 2012-09-27 MED ORDER — ALBUTEROL SULFATE (5 MG/ML) 0.5% IN NEBU
5.0000 mg | INHALATION_SOLUTION | Freq: Once | RESPIRATORY_TRACT | Status: AC
  Administered 2012-09-27: 5 mg via RESPIRATORY_TRACT
  Filled 2012-09-27: qty 1

## 2012-09-27 MED ORDER — ONDANSETRON 8 MG PO TBDP
8.0000 mg | ORAL_TABLET | Freq: Once | ORAL | Status: AC
  Administered 2012-09-27: 8 mg via ORAL
  Filled 2012-09-27: qty 1

## 2012-09-27 NOTE — ED Notes (Signed)
Patient states she is feeling better after receiving nebulizer treatments.

## 2012-09-27 NOTE — ED Provider Notes (Signed)
History    This chart was scribed for Ward Givens, MD, MD by Smitty Pluck, ED Scribe. The patient was seen in room APA14 and the patient's care was started at 9:44 PM.   CSN: [id number]  Arrival date & time 09/27/12  1949      Chief Complaint  Patient presents with  . Cough    (Consider location/radiation/quality/duration/timing/severity/associated sxs/prior treatment) Patient is a 21 y.o. female presenting with cough and vomiting. The history is provided by the patient. No language interpreter was used.  Cough This is a new problem. The current episode started yesterday. The problem occurs constantly. The problem has not changed since onset.The cough is non-productive. There has been no fever.  Emesis  This is a new problem. The current episode started 2 days ago. The problem occurs 2 to 4 times per day. The problem has not changed since onset.The emesis has an appearance of stomach contents. Associated symptoms include cough.   Monica Cox is a 21 y.o. female who presents to the Emergency Department complaining of constant, moderate emesis and nausea onset 2 days ago. She states that she has vomited 1x/day yesterday and today.however she has constant nausea. She denies abdominal pain but states her ribs are sore from coughing. Pt reports having non productive cough onset today. She reports having sore throat.  She reports that her son was seen in the ED 4 days ago for bronchiolitis. She denies wheezing, nasal congestion, fever, rhinorrhea, diarrhea, abdominal painand any other symptoms. She states she has an inhaler which has not helped. In ED she has had 1 breathing treatment without relief. Pt denies smoking cigarettes.   PCP is Dr. Felecia Shelling  Past Medical History  Diagnosis Date  . Asthma   . Reflux     Past Surgical History  Procedure Date  . None     Family History  Problem Relation Age of Onset  . Ulcers Father   . Ulcers Paternal Grandmother   . Ulcers Paternal  Aunt   . Liver disease Neg Hx   . Colon cancer Neg Hx     History  Substance Use Topics  . Smoking status: Never Smoker   . Smokeless tobacco: Not on file  . Alcohol Use: No  unemployed  OB History    Grav Para Term Preterm Abortions TAB SAB Ect Mult Living                  Review of Systems  Respiratory: Positive for cough.   Gastrointestinal: Positive for nausea and vomiting.  All other systems reviewed and are negative.    Allergies  Review of patient's allergies indicates no known allergies.  Home Medications   Current Outpatient Rx  Name  Route  Sig  Dispense  Refill  . ALBUTEROL SULFATE HFA 108 (90 BASE) MCG/ACT IN AERS   Inhalation   Inhale 2 puffs into the lungs every 4 (four) hours as needed for wheezing.   1 Inhaler   0   . FLUTICASONE-SALMETEROL 250-50 MCG/DOSE IN AEPB   Inhalation   Inhale 1 puff into the lungs daily as needed. FOR SHORTNESS OF BREATH         . MEDROXYPROGESTERONE ACETATE 150 MG/ML IM SUSP   Intramuscular   Inject 150 mg into the muscle every 3 (three) months.           BP 111/85  Pulse 104  Temp 98.8 F (37.1 C) (Oral)  Resp 18  Ht 5' 3.5" (1.613  m)  Wt 174 lb (78.926 kg)  BMI 30.34 kg/m2  SpO2 100%  LMP 09/06/2012  Vital signs normal except mild tachycardia   Physical Exam  Nursing note and vitals reviewed. Constitutional: She is oriented to person, place, and time. She appears well-developed and well-nourished.  Non-toxic appearance. She does not appear ill. No distress.  HENT:  Head: Normocephalic and atraumatic.  Right Ear: External ear normal.  Left Ear: External ear normal.  Nose: Nose normal. No mucosal edema or rhinorrhea.  Mouth/Throat: Oropharynx is clear and moist and mucous membranes are normal. No dental abscesses or uvula swelling.  Eyes: Conjunctivae normal and EOM are normal. Pupils are equal, round, and reactive to light.  Neck: Normal range of motion and full passive range of motion without  pain. Neck supple.  Cardiovascular: Normal rate, regular rhythm and normal heart sounds.  Exam reveals no gallop and no friction rub.   No murmur heard. Pulmonary/Chest: Effort normal. No respiratory distress. She has no wheezes. She has no rhonchi. She has no rales. She exhibits no tenderness and no crepitus.       Diffuse diminished breath sounds   Abdominal: Soft. Normal appearance and bowel sounds are normal. She exhibits no distension. There is no tenderness. There is no rebound and no guarding.  Musculoskeletal: Normal range of motion. She exhibits no edema and no tenderness.       Moves all extremities well.   Neurological: She is alert and oriented to person, place, and time. She has normal strength. No cranial nerve deficit.  Skin: Skin is warm, dry and intact. No rash noted. No erythema. No pallor.  Psychiatric: She has a normal mood and affect. Her speech is normal and behavior is normal. Her mood appears not anxious.    ED Course  Procedures (including critical care time) DIAGNOSTIC STUDIES: Oxygen Saturation is 100% on room air, normal by my interpretation.    COORDINATION OF CARE: 9:47 PM Discussed ED treatment with pt  9:48 PM Ordered:   Medications  albuterol (PROVENTIL) (5 MG/ML) 0.5% nebulizer solution 2.5 mg (2.5 mg Nebulization Given 09/27/12 2132)  ipratropium (ATROVENT) nebulizer solution 0.5 mg (0.5 mg Nebulization Given 09/27/12 2132)  predniSONE (DELTASONE) tablet 60 mg (60 mg Oral Given 09/27/12 2159)  albuterol (PROVENTIL) (5 MG/ML) 0.5% nebulizer solution 5 mg (5 mg Nebulization Given 09/27/12 2200)  ipratropium (ATROVENT) nebulizer solution 0.5 mg (0.5 mg Nebulization Given 09/27/12 2200)  ondansetron (ZOFRAN-ODT) disintegrating tablet 8 mg (8 mg Oral Given 09/27/12 2159)   At time of my exam patient had just finished a nebulizer treatment. She reports it helped but she still felt short of breath. A second nebulizer was ordered and also  prednisone.  Recheck after second nebulizer. Patient states she feels much better. She has improved air movement. There is no wheezes or rhonchi heard.  Patient does not appear dehydrated. She was not given IV fluids. She has only vomited once yesterday and once today.    1. Asthma   2. Nausea and vomiting     New Prescriptions   ALBUTEROL (PROVENTIL HFA;VENTOLIN HFA) 108 (90 BASE) MCG/ACT INHALER    Inhale 2 puffs into the lungs every 4 (four) hours as needed for wheezing.   PREDNISONE (DELTASONE) 20 MG TABLET    Take 3 po QD x 2d starting tomorrow, then 2 po QD x 3d then 1 po QD x 3d   PROMETHAZINE (PHENERGAN) 25 MG TABLET    Take 1 tablet (25 mg total) by  mouth every 6 (six) hours as needed for nausea.    Plan discharge  Devoria Albe, MD, FACEP   MDM    I personally performed the services described in this documentation, which was scribed in my presence. The recorded information has been reviewed and considered.  Devoria Albe, MD, FACEP    Ward Givens, MD 09/28/12 [phone]

## 2012-09-27 NOTE — ED Notes (Signed)
Cough, vomiting for 2 days, no fever.   No diarrhea

## 2012-09-28 MED ORDER — ALBUTEROL SULFATE HFA 108 (90 BASE) MCG/ACT IN AERS: 2.0000 | INHALATION_SPRAY | RESPIRATORY_TRACT | Status: DC | PRN

## 2012-09-28 MED ORDER — PROMETHAZINE HCL 25 MG PO TABS: 25.0000 mg | ORAL_TABLET | Freq: Four times a day (QID) | ORAL | Status: DC | PRN

## 2012-09-28 MED ORDER — PREDNISONE 20 MG PO TABS: ORAL_TABLET | ORAL | Status: DC

## 2012-09-28 NOTE — ED Notes (Signed)
Discharge instructions given and reviewed with patient.  Prescriptions given for Prednisone taper, Albuterol MDI, and Phenergan; effects and use explained for each.  Patient verbalized understanding to take medications as directed and possible sedating effects of Phenergan.  Patient ambulatory with steady gait; discharged home in good condition.

## 2013-03-01 ENCOUNTER — Encounter (HOSPITAL_COMMUNITY): Payer: Self-pay | Admitting: Emergency Medicine

## 2013-03-01 ENCOUNTER — Emergency Department (HOSPITAL_COMMUNITY)
Admission: EM | Admit: 2013-03-01 | Discharge: 2013-03-01 | Disposition: A | Discharge status: Home / Self Care | Payer: No Typology Code available for payment source | Attending: Emergency Medicine | Admitting: Emergency Medicine

## 2013-03-01 ENCOUNTER — Emergency Department (HOSPITAL_COMMUNITY): Payer: No Typology Code available for payment source

## 2013-03-01 DIAGNOSIS — IMO0002 Reserved for concepts with insufficient information to code with codable children: Secondary | ICD-10-CM | POA: Insufficient documentation

## 2013-03-01 DIAGNOSIS — Z79899 Other long term (current) drug therapy: Secondary | ICD-10-CM | POA: Insufficient documentation

## 2013-03-01 DIAGNOSIS — K219 Gastro-esophageal reflux disease without esophagitis: Secondary | ICD-10-CM | POA: Insufficient documentation

## 2013-03-01 DIAGNOSIS — S161XXA Strain of muscle, fascia and tendon at neck level, initial encounter: Secondary | ICD-10-CM

## 2013-03-01 DIAGNOSIS — J45909 Unspecified asthma, uncomplicated: Secondary | ICD-10-CM | POA: Insufficient documentation

## 2013-03-01 DIAGNOSIS — S139XXA Sprain of joints and ligaments of unspecified parts of neck, initial encounter: Secondary | ICD-10-CM | POA: Insufficient documentation

## 2013-03-01 DIAGNOSIS — Y9389 Activity, other specified: Secondary | ICD-10-CM | POA: Insufficient documentation

## 2013-03-01 DIAGNOSIS — Y92410 Unspecified street and highway as the place of occurrence of the external cause: Secondary | ICD-10-CM | POA: Insufficient documentation

## 2013-03-01 LAB — URINALYSIS, ROUTINE W REFLEX MICROSCOPIC
Glucose, UA: NEGATIVE mg/dL
Hgb urine dipstick: NEGATIVE
Specific Gravity, Urine: 1.035 — ABNORMAL HIGH (ref 1.005–1.030)

## 2013-03-01 LAB — URINE MICROSCOPIC-ADD ON

## 2013-03-01 MED ORDER — OXYCODONE-ACETAMINOPHEN 5-325 MG PO TABS
2.0000 | ORAL_TABLET | Freq: Once | ORAL | Status: AC
  Administered 2013-03-01: 2 via ORAL
  Filled 2013-03-01: qty 2

## 2013-03-01 NOTE — ED Notes (Signed)
Bed:WA04<BR> Expected date:<BR> Expected time:<BR> Means of arrival:<BR> Comments:<BR> EMS
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
## 2013-03-02 NOTE — ED Provider Notes (Signed)
Medical screening examination/treatment/procedure(s) were performed by non-physician practitioner and as supervising physician I was immediately available for consultation/collaboration.   Gwyneth Sprout, MD 03/02/13 [phone]

## 2013-04-05 ENCOUNTER — Emergency Department (HOSPITAL_COMMUNITY)
Admission: EM | Admit: 2013-04-05 | Discharge: 2013-04-05 | Disposition: A | Discharge status: Home / Self Care | Payer: Medicaid Other | Attending: Emergency Medicine | Admitting: Emergency Medicine

## 2013-04-05 ENCOUNTER — Emergency Department (HOSPITAL_COMMUNITY): Payer: Medicaid Other

## 2013-04-05 ENCOUNTER — Encounter (HOSPITAL_COMMUNITY): Payer: Self-pay | Admitting: *Deleted

## 2013-04-05 DIAGNOSIS — S61509A Unspecified open wound of unspecified wrist, initial encounter: Secondary | ICD-10-CM | POA: Insufficient documentation

## 2013-04-05 DIAGNOSIS — J45909 Unspecified asthma, uncomplicated: Secondary | ICD-10-CM | POA: Insufficient documentation

## 2013-04-05 DIAGNOSIS — S61511A Laceration without foreign body of right wrist, initial encounter: Secondary | ICD-10-CM

## 2013-04-05 DIAGNOSIS — Z3202 Encounter for pregnancy test, result negative: Secondary | ICD-10-CM | POA: Insufficient documentation

## 2013-04-05 DIAGNOSIS — S60311A Abrasion of right thumb, initial encounter: Secondary | ICD-10-CM

## 2013-04-05 DIAGNOSIS — M79641 Pain in right hand: Secondary | ICD-10-CM

## 2013-04-05 DIAGNOSIS — W268XXA Contact with other sharp object(s), not elsewhere classified, initial encounter: Secondary | ICD-10-CM | POA: Insufficient documentation

## 2013-04-05 DIAGNOSIS — IMO0002 Reserved for concepts with insufficient information to code with codable children: Secondary | ICD-10-CM | POA: Insufficient documentation

## 2013-04-05 DIAGNOSIS — Y939 Activity, unspecified: Secondary | ICD-10-CM | POA: Insufficient documentation

## 2013-04-05 DIAGNOSIS — Z23 Encounter for immunization: Secondary | ICD-10-CM | POA: Insufficient documentation

## 2013-04-05 DIAGNOSIS — R062 Wheezing: Secondary | ICD-10-CM | POA: Insufficient documentation

## 2013-04-05 DIAGNOSIS — Y929 Unspecified place or not applicable: Secondary | ICD-10-CM | POA: Insufficient documentation

## 2013-04-05 DIAGNOSIS — Z8719 Personal history of other diseases of the digestive system: Secondary | ICD-10-CM | POA: Insufficient documentation

## 2013-04-05 DIAGNOSIS — Z79899 Other long term (current) drug therapy: Secondary | ICD-10-CM | POA: Insufficient documentation

## 2013-04-05 MED ORDER — BACITRACIN-NEOMYCIN-POLYMYXIN 400-5-5000 EX OINT
TOPICAL_OINTMENT | CUTANEOUS | Status: AC
  Filled 2013-04-05: qty 1

## 2013-04-05 MED ORDER — TETANUS-DIPHTH-ACELL PERTUSSIS 5-2.5-18.5 LF-MCG/0.5 IM SUSP
0.5000 mL | Freq: Once | INTRAMUSCULAR | Status: AC
  Administered 2013-04-05: 0.5 mL via INTRAMUSCULAR
  Filled 2013-04-05: qty 0.5
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
## 2013-04-08 NOTE — ED Provider Notes (Signed)
Medical screening examination/treatment/procedure(s) were performed by non-physician practitioner and as supervising physician I was immediately available for consultation/collaboration.   Eldrick Penick L Renna Kilmer, MD 04/08/13 1346 

## 2013-05-02 ENCOUNTER — Encounter (HOSPITAL_COMMUNITY): Payer: Self-pay

## 2013-05-02 ENCOUNTER — Emergency Department (HOSPITAL_COMMUNITY)
Admission: EM | Admit: 2013-05-02 | Discharge: 2013-05-02 | Disposition: A | Discharge status: Home / Self Care | Payer: Medicaid Other | Attending: Emergency Medicine | Admitting: Emergency Medicine

## 2013-05-02 DIAGNOSIS — J029 Acute pharyngitis, unspecified: Secondary | ICD-10-CM | POA: Insufficient documentation

## 2013-05-02 DIAGNOSIS — J45901 Unspecified asthma with (acute) exacerbation: Secondary | ICD-10-CM | POA: Insufficient documentation

## 2013-05-02 DIAGNOSIS — J40 Bronchitis, not specified as acute or chronic: Secondary | ICD-10-CM

## 2013-05-02 DIAGNOSIS — Z79899 Other long term (current) drug therapy: Secondary | ICD-10-CM | POA: Insufficient documentation

## 2013-05-02 DIAGNOSIS — J3489 Other specified disorders of nose and nasal sinuses: Secondary | ICD-10-CM | POA: Insufficient documentation

## 2013-05-02 DIAGNOSIS — J069 Acute upper respiratory infection, unspecified: Secondary | ICD-10-CM | POA: Insufficient documentation

## 2013-05-02 DIAGNOSIS — Z8719 Personal history of other diseases of the digestive system: Secondary | ICD-10-CM | POA: Insufficient documentation

## 2013-05-02 MED ORDER — PREDNISONE (PAK) 10 MG PO TABS: ORAL_TABLET | ORAL | Status: DC

## 2013-05-02 MED ORDER — PSEUDOEPHEDRINE-CODEINE-GG 30-10-100 MG/5ML PO SOLN: 5.0000 mL | ORAL | Status: DC | PRN

## 2013-05-02 MED ORDER — HYDROCOD POLST-CHLORPHEN POLST 10-8 MG/5ML PO LQCR
5.0000 mL | Freq: Once | ORAL | Status: AC
  Administered 2013-05-02: 5 mL via ORAL
  Filled 2013-05-02: qty 5

## 2013-05-02 MED ORDER — PREDNISONE 50 MG PO TABS
60.0000 mg | ORAL_TABLET | Freq: Once | ORAL | Status: AC
  Administered 2013-05-02: 60 mg via ORAL
  Filled 2013-05-02: qty 1

## 2013-05-02 MED ORDER — ALBUTEROL SULFATE HFA 108 (90 BASE) MCG/ACT IN AERS
2.0000 | INHALATION_SPRAY | RESPIRATORY_TRACT | Status: DC
  Administered 2013-05-02: 2 via RESPIRATORY_TRACT
  Filled 2013-05-02: qty 6.7

## 2013-05-02 NOTE — ED Notes (Signed)
Pt reports cough, congestion and sore throat that started 3 weeks ago, sore throat has gotten better, but cont. To have cough, green in color. No fever.

## 2013-05-02 NOTE — ED Provider Notes (Signed)
Medical screening examination/treatment/procedure(s) were performed by non-physician practitioner and as supervising physician I was immediately available for consultation/collaboration. Hymie Gorr, MD, FACEP   Jorden Minchey L Emmalea Treanor, MD 05/02/13 2042 

## 2013-05-02 NOTE — ED Provider Notes (Signed)
CSN: [id number]     Arrival date & time 05/02/13  1728 History     First MD Initiated Contact with Patient 05/02/13 1743     Chief Complaint  Patient presents with  . Cough   (Consider location/radiation/quality/duration/timing/severity/associated sxs/prior Treatment) Patient is a 22 y.o. female presenting with cough. The history is provided by the patient.  Cough Cough characteristics:  Productive Sputum characteristics:  Wallace Cullens and yellow Severity:  Moderate Onset quality:  Gradual Duration:  3 weeks Timing:  Intermittent Progression:  Worsening Chronicity:  New Smoker: no   Context: sick contacts and weather changes   Relieved by:  Nothing Worsened by:  Nothing tried Ineffective treatments: OTC meds. Associated symptoms: sinus congestion, sore throat and wheezing   Associated symptoms: no chest pain, no eye discharge and no shortness of breath   Risk factors: no chemical exposure and no recent travel     Past Medical History  Diagnosis Date  . Asthma   . Reflux    Past Surgical History  Procedure Laterality Date  . None     Family History  Problem Relation Age of Onset  . Ulcers Father   . Ulcers Paternal Grandmother   . Ulcers Paternal Aunt   . Liver disease Neg Hx   . Colon cancer Neg Hx    History  Substance Use Topics  . Smoking status: Never Smoker   . Smokeless tobacco: Not on file  . Alcohol Use: Yes     Comment: occasionally   OB History   Grav Para Term Preterm Abortions TAB SAB Ect Mult Living                 Review of Systems  Constitutional: Negative for activity change.       All ROS Neg except as noted in HPI  HENT: Positive for sore throat. Negative for nosebleeds and neck pain.   Eyes: Negative for photophobia and discharge.  Respiratory: Positive for cough and wheezing. Negative for shortness of breath.   Cardiovascular: Negative for chest pain and palpitations.  Gastrointestinal: Negative for abdominal pain and blood in stool.   Genitourinary: Negative for dysuria, frequency and hematuria.  Musculoskeletal: Negative for back pain and arthralgias.  Skin: Negative.   Neurological: Negative for dizziness, seizures and speech difficulty.  Psychiatric/Behavioral: Negative for hallucinations and confusion.    Allergies  Review of patient's allergies indicates no known allergies.  Home Medications   Current Outpatient Rx  Name  Route  Sig  Dispense  Refill  . albuterol (PROVENTIL HFA;VENTOLIN HFA) 108 (90 BASE) MCG/ACT inhaler   Inhalation   Inhale 2 puffs into the lungs every 4 (four) hours as needed for wheezing.   1 Inhaler   0   . Phenylephrine-Pheniramine-DM (THERAFLU COLD & COUGH) 07-26-19 MG PACK   Oral   Take 1 packet by mouth as needed (for cold and cough).         Marland Kitchen albuterol (PROVENTIL) (2.5 MG/3ML) 0.083% nebulizer solution   Nebulization   Take 2.5 mg by nebulization every 4 (four) hours as needed for wheezing.         . predniSONE (STERAPRED UNI-PAK) 10 MG tablet      6,5,4,3,2,1 - take with food   21 tablet   0   . pseudoephedrine-codeine-guaifenesin (MYTUSSIN DAC) 30-10-100 MG/5ML solution   Oral   Take 5 mLs by mouth every 4 (four) hours as needed for cough.   150 mL   0    BP 114/73  Pulse 74  Temp(Src) 98 F (36.7 C) (Oral)  Resp 20  Ht 5' 3.5" (1.613 m)  Wt 177 lb (80.287 kg)  BMI 30.86 kg/m2  SpO2 96%  LMP 04/28/2013 Physical Exam  Nursing note and vitals reviewed. Constitutional: She is oriented to person, place, and time. She appears well-developed and well-nourished.  Non-toxic appearance.  HENT:  Head: Normocephalic.  Right Ear: Tympanic membrane and external ear normal.  Left Ear: Tympanic membrane and external ear normal.  Eyes: EOM and lids are normal. Pupils are equal, round, and reactive to light.  Neck: Normal range of motion. Neck supple. Carotid bruit is not present.  Mild nasal congestion.  Cardiovascular: Normal rate, regular rhythm, normal heart  sounds, intact distal pulses and normal pulses.   Pulmonary/Chest: Breath sounds normal. No respiratory distress. She has no wheezes.  Course breath sounds.  Abdominal: Soft. Bowel sounds are normal. There is no tenderness. There is no guarding.  Musculoskeletal: Normal range of motion.  Lymphadenopathy:       Head (right side): No submandibular adenopathy present.       Head (left side): No submandibular adenopathy present.    She has no cervical adenopathy.  Neurological: She is alert and oriented to person, place, and time. She has normal strength. No cranial nerve deficit or sensory deficit.  Skin: Skin is warm and dry. No rash noted.  Psychiatric: She has a normal mood and affect. Her speech is normal.    ED Course   Procedures (including critical care time)  Labs Reviewed - No data to display No results found. 1. Bronchitis   2. URI (upper respiratory infection)     MDM  *I have reviewed nursing notes, vital signs, and all appropriate lab and imaging results for this patient.** Pt presents to the ED with 3 weeks of cough, congestion, and intermittent sore throat. No High fevers. Pulse ox 96% on RA. WNL by my interpretation. Rx for Robitussin DAC, and prednisone given to the patient. Albuterol inhaler also given to the patient.  Kathie Dike, PA-C 05/02/13 1830

## 2013-07-12 ENCOUNTER — Encounter (HOSPITAL_COMMUNITY): Payer: Self-pay | Admitting: *Deleted

## 2013-07-12 ENCOUNTER — Emergency Department (HOSPITAL_COMMUNITY)
Admission: EM | Admit: 2013-07-12 | Discharge: 2013-07-12 | Disposition: A | Discharge status: Home / Self Care | Payer: Medicaid Other | Attending: Emergency Medicine | Admitting: Emergency Medicine

## 2013-07-12 DIAGNOSIS — Z8719 Personal history of other diseases of the digestive system: Secondary | ICD-10-CM | POA: Insufficient documentation

## 2013-07-12 DIAGNOSIS — R599 Enlarged lymph nodes, unspecified: Secondary | ICD-10-CM | POA: Insufficient documentation

## 2013-07-12 DIAGNOSIS — Z79899 Other long term (current) drug therapy: Secondary | ICD-10-CM | POA: Insufficient documentation

## 2013-07-12 DIAGNOSIS — K047 Periapical abscess without sinus: Secondary | ICD-10-CM

## 2013-07-12 DIAGNOSIS — K029 Dental caries, unspecified: Secondary | ICD-10-CM | POA: Insufficient documentation

## 2013-07-12 DIAGNOSIS — J45909 Unspecified asthma, uncomplicated: Secondary | ICD-10-CM | POA: Insufficient documentation

## 2013-07-12 MED ORDER — OXYCODONE-ACETAMINOPHEN 5-325 MG PO TABS
1.0000 | ORAL_TABLET | Freq: Once | ORAL | Status: AC
  Administered 2013-07-12: 1 via ORAL
  Filled 2013-07-12: qty 1

## 2013-07-12 MED ORDER — CLINDAMYCIN HCL 150 MG PO CAPS: 150.0000 mg | ORAL_CAPSULE | Freq: Four times a day (QID) | ORAL | Status: DC

## 2013-07-12 MED ORDER — HYDROCODONE-ACETAMINOPHEN 5-325 MG PO TABS: 1.0000 | ORAL_TABLET | ORAL | Status: DC | PRN

## 2013-07-12 MED ORDER — AMOXICILLIN 250 MG PO CAPS
500.0000 mg | ORAL_CAPSULE | Freq: Once | ORAL | Status: AC
  Administered 2013-07-12: 500 mg via ORAL
  Filled 2013-07-12: qty 2

## 2013-07-12 NOTE — ED Notes (Signed)
Pt seen and evaluated by EDNP for initial assessment. 

## 2013-07-12 NOTE — ED Provider Notes (Signed)
CSN: [id number]     Arrival date & time 07/12/13  1737 History   First MD Initiated Contact with Patient 07/12/13 1809     Chief Complaint  Patient presents with  . Dental Pain   (Consider location/radiation/quality/duration/timing/severity/associated sxs/prior Treatment) Patient is a 22 y.o. female presenting with tooth pain. The history is provided by the patient.  Dental Pain Location:  Lower Lower teeth location:  17/LL 3rd molar and 18/LL 2nd molar Quality:  Throbbing Severity:  Severe Onset quality:  Gradual Duration:  1 week Timing:  Constant Progression:  Worsening Chronicity:  New Context: abscess and dental caries   Relieved by:  Nothing Worsened by:  Cold food/drink Ineffective treatments:  Acetaminophen Associated symptoms: facial swelling   Associated symptoms: no fever     Past Medical History  Diagnosis Date  . Asthma   . Reflux    Past Surgical History  Procedure Laterality Date  . None     Family History  Problem Relation Age of Onset  . Ulcers Father   . Ulcers Paternal Grandmother   . Ulcers Paternal Aunt   . Liver disease Neg Hx   . Colon cancer Neg Hx    History  Substance Use Topics  . Smoking status: Never Smoker   . Smokeless tobacco: Not on file  . Alcohol Use: Yes     Comment: occasionally   OB History   Grav Para Term Preterm Abortions TAB SAB Ect Mult Living                 Review of Systems  Constitutional: Negative for fever and chills.  HENT: Positive for facial swelling and dental problem. Negative for ear pain and sore throat.   Respiratory: Negative for shortness of breath.   Gastrointestinal: Negative for nausea and vomiting.  Skin: Negative for rash.  Neurological: Negative for dizziness and syncope.  Psychiatric/Behavioral: The patient is not nervous/anxious.     Allergies  Review of patient's allergies indicates no known allergies.  Home Medications   Current Outpatient Rx  Name  Route  Sig  Dispense  Refill   . EXPIRED: albuterol (PROVENTIL HFA;VENTOLIN HFA) 108 (90 BASE) MCG/ACT inhaler   Inhalation   Inhale 2 puffs into the lungs every 4 (four) hours as needed for wheezing.   1 Inhaler   0   . albuterol (PROVENTIL) (2.5 MG/3ML) 0.083% nebulizer solution   Nebulization   Take 2.5 mg by nebulization every 4 (four) hours as needed for wheezing.         Marland Kitchen Phenylephrine-Pheniramine-DM (THERAFLU COLD & COUGH) 07-26-19 MG PACK   Oral   Take 1 packet by mouth as needed (for cold and cough).         . predniSONE (STERAPRED UNI-PAK) 10 MG tablet      6,5,4,3,2,1 - take with food   21 tablet   0   . pseudoephedrine-codeine-guaifenesin (MYTUSSIN DAC) 30-10-100 MG/5ML solution   Oral   Take 5 mLs by mouth every 4 (four) hours as needed for cough.   150 mL   0    BP 137/110  Pulse 85  Temp(Src) 99.1 F (37.3 C) (Oral)  Resp 20  Ht 5\' 3"  (1.6 m)  Wt 177 lb (80.287 kg)  BMI 31.36 kg/m2  SpO2 99%  LMP 06/24/2013 Physical Exam  Nursing note and vitals reviewed. Constitutional: She is oriented to person, place, and time. She appears well-developed and well-nourished. No distress.  HENT:  Right Ear: Tympanic membrane normal.  Left Ear: Tympanic membrane normal.  Mouth/Throat: Uvula is midline, oropharynx is clear and moist and mucous membranes are normal. Dental abscesses and dental caries present.    There is swelling noted to the left jaw. There is pain and swelling in the area of the 3rd molar that is partially erupted. There is pain in the second molar with decay.  Eyes: EOM are normal.  Neck: Neck supple.  Cardiovascular: Normal rate, regular rhythm and normal heart sounds.   Pulmonary/Chest: Effort normal and breath sounds normal.  Musculoskeletal: Normal range of motion.  Lymphadenopathy:    She has cervical adenopathy (left).  Neurological: She is alert and oriented to person, place, and time. No cranial nerve deficit.  Skin: Skin is warm and dry.  Psychiatric: She has  a normal mood and affect. Her behavior is normal.    ED Course  Procedures   MDM  22 y.o. female with dental abscess Elevated BP on arrival to the ED but repeated and BP 133/91  Pulse 85  Temp(Src) 99.1 F (37.3 C) (Oral)  Resp 20  Ht 5\' 3"  (1.6 m)  Wt 177 lb (80.287 kg)  BMI 31.36 kg/m2  SpO2 99%  LMP 06/24/2013 Patient stable for discharge home without any immediate complications. She is to follow up with the dental clinic ASAP. Will start antibiotics and pain medication tonight.    Medication List    TAKE these medications       clindamycin 150 MG capsule  Commonly known as:  CLEOCIN  Take 1 capsule (150 mg total) by mouth every 6 (six) hours.     HYDROcodone-acetaminophen 5-325 MG per tablet  Commonly known as:  NORCO/VICODIN  Take 1 tablet by mouth every 4 (four) hours as needed.      ASK your doctor about these medications       albuterol (2.5 MG/3ML) 0.083% nebulizer solution  Commonly known as:  PROVENTIL  Take 2.5 mg by nebulization every 4 (four) hours as needed for wheezing.     albuterol 108 (90 BASE) MCG/ACT inhaler  Commonly known as:  PROVENTIL HFA;VENTOLIN HFA  Inhale 2 puffs into the lungs every 4 (four) hours as needed for wheezing.     predniSONE 10 MG tablet  Commonly known as:  STERAPRED UNI-PAK  6,5,4,3,2,1 - take with food     pseudoephedrine-codeine-guaifenesin 30-10-100 MG/5ML solution  Commonly known as:  MYTUSSIN DAC  Take 5 mLs by mouth every 4 (four) hours as needed for cough.     THERAFLU COLD & COUGH 07-26-19 MG Pack  Generic drug:  Phenylephrine-Pheniramine-DM  Take 1 packet by mouth as needed (for cold and cough).         Janne Napoleon, [place] 07/12/13 2055

## 2013-07-12 NOTE — ED Notes (Signed)
Woke w/L lower jaw pain.  States she feels like wisdom tooth is coming in.

## 2013-07-13 NOTE — ED Provider Notes (Signed)
Medical screening examination/treatment/procedure(s) were performed by non-physician practitioner and as supervising physician I was immediately available for consultation/collaboration.  Flint Melter, MD 07/13/13 [phone]

## 2013-08-22 ENCOUNTER — Emergency Department (HOSPITAL_COMMUNITY)
Admission: EM | Admit: 2013-08-22 | Discharge: 2013-08-22 | Disposition: A | Discharge status: Home / Self Care | Payer: Medicaid Other | Attending: Emergency Medicine | Admitting: Emergency Medicine

## 2013-08-22 DIAGNOSIS — Z8719 Personal history of other diseases of the digestive system: Secondary | ICD-10-CM | POA: Insufficient documentation

## 2013-08-22 DIAGNOSIS — IMO0002 Reserved for concepts with insufficient information to code with codable children: Secondary | ICD-10-CM | POA: Insufficient documentation

## 2013-08-22 DIAGNOSIS — Z79899 Other long term (current) drug therapy: Secondary | ICD-10-CM | POA: Insufficient documentation

## 2013-08-22 DIAGNOSIS — R21 Rash and other nonspecific skin eruption: Secondary | ICD-10-CM | POA: Insufficient documentation

## 2013-08-22 DIAGNOSIS — R112 Nausea with vomiting, unspecified: Secondary | ICD-10-CM | POA: Insufficient documentation

## 2013-08-22 DIAGNOSIS — R109 Unspecified abdominal pain: Secondary | ICD-10-CM | POA: Insufficient documentation

## 2013-08-22 DIAGNOSIS — J45909 Unspecified asthma, uncomplicated: Secondary | ICD-10-CM | POA: Insufficient documentation

## 2013-08-22 DIAGNOSIS — Z3202 Encounter for pregnancy test, result negative: Secondary | ICD-10-CM | POA: Insufficient documentation

## 2013-08-22 DIAGNOSIS — R197 Diarrhea, unspecified: Secondary | ICD-10-CM | POA: Insufficient documentation

## 2013-08-22 LAB — URINALYSIS, ROUTINE W REFLEX MICROSCOPIC
Glucose, UA: NEGATIVE mg/dL
Ketones, ur: NEGATIVE mg/dL
Leukocytes, UA: NEGATIVE
pH: 6 (ref 5.0–8.0)

## 2013-08-22 LAB — URINE MICROSCOPIC-ADD ON

## 2013-08-22 MED ORDER — DIPHENHYDRAMINE HCL 25 MG PO CAPS
50.0000 mg | ORAL_CAPSULE | Freq: Once | ORAL | Status: AC
  Administered 2013-08-22: 50 mg via ORAL
  Filled 2013-08-22: qty 2

## 2013-08-22 MED ORDER — DEXAMETHASONE SODIUM PHOSPHATE 10 MG/ML IJ SOLN
10.0000 mg | Freq: Once | INTRAMUSCULAR | Status: AC
  Administered 2013-08-22: 10 mg via INTRAMUSCULAR
  Filled 2013-08-22: qty 1

## 2013-08-22 MED ORDER — ONDANSETRON 8 MG PO TBDP
8.0000 mg | ORAL_TABLET | Freq: Once | ORAL | Status: AC
  Administered 2013-08-22: 8 mg via ORAL
  Filled 2013-08-22: qty 1

## 2013-08-22 NOTE — ED Notes (Signed)
Wednesday morning Pt. Claims she started having episodes of diarrhea, each time the patient ate or drank another episode of diarrhea occurred. Rash started Friday, rash is raised welts that is reddened. Rash is present on arms, breast, abdomen and butt. Pt. Also claims that abdominal cramping is present 9/10. Vomiting episodes began last night, patient claimed she could not keep anything down. Pt. Has only had one vomiting episode since last night. Mother states that patient was vomiting and having diarrhea episodes last week.

## 2013-08-22 NOTE — ED Provider Notes (Signed)
CSN: [id number]     Arrival date & time 08/22/13  4098 History  This chart was scribed for Joya Gaskins, MD by Bennett Scrape, ED Scribe. This patient was seen in room APA12/APA12 and the patient's care was started at 8:45 AM.   Chief Complaint  Patient presents with  . Diarrhea  . Abdominal Cramping  . Rash    Patient is a 22 y.o. female presenting with diarrhea. The history is provided by the patient. No language interpreter was used.  Diarrhea Quality:  Watery Severity:  Moderate Number of episodes:  3 Duration:  6 days Timing:  Constant Progression:  Unchanged Relieved by:  Nothing Ineffective treatments:  None tried Associated symptoms: abdominal pain and vomiting   Associated symptoms: no recent cough and no fever   Risk factors: no recent antibiotic use, no sick contacts and no travel to endemic areas     HPI Comments: MARJIE CHEA is a 22 y.o. female who presents to the Emergency Department complaining of 6 days of diarrhea described as diarrhea with associated waxing and waning, sharp abdominal cramps and emesis that started last night. She reports 3 episodes of diarrhea and one episode of emesis yesterday. No diarrhea or emesis episodes this morning. She denies any noticeable change in the cramps with BMs or emesis. She states that the diarrhea is triggered by eating and drinking and reports that she has been unable to tolerate liquids or solids since the onset of the emesis last night. She has tried imodium for the diarrhea with no improvement. She admits that she had one episode of similar symptoms last month that resolved on its own. She denies any recent travels,  sick contacts with similar symptoms or recent antibiotic use.She denies any known fevers, cough, urinary symptoms, hematochezia or hematemesis. She denies any prior abdominal surgeries.   She has a secondary complaint of a pruritic rash that started 3 days but has since spread. She states that the rash  is now located on her arms, breasts, abdomen and buttocks. She denies any contact with known allergens or sick contacts with similar symptoms. She denies taking any OTC medications including Benadryl for the symptoms.    Past Medical History  Diagnosis Date  . Asthma   . Reflux    Past Surgical History  Procedure Laterality Date  . None     Family History  Problem Relation Age of Onset  . Ulcers Father   . Ulcers Paternal Grandmother   . Ulcers Paternal Aunt   . Liver disease Neg Hx   . Colon cancer Neg Hx    History  Substance Use Topics  . Smoking status: Never Smoker   . Smokeless tobacco: Not on file  . Alcohol Use: Yes     Comment: occasionally   No OB history provided.   Review of Systems  Constitutional: Negative for fever.  Respiratory: Negative for cough.   Gastrointestinal: Positive for nausea, vomiting, abdominal pain and diarrhea. Negative for blood in stool.  Genitourinary: Negative for dysuria and hematuria.  Skin: Positive for rash.  All other systems reviewed and are negative.    Allergies  Review of patient's allergies indicates no known allergies.  Home Medications   Current Outpatient Rx  Name  Route  Sig  Dispense  Refill  . EXPIRED: albuterol (PROVENTIL HFA;VENTOLIN HFA) 108 (90 BASE) MCG/ACT inhaler   Inhalation   Inhale 2 puffs into the lungs every 4 (four) hours as needed for wheezing.   1  Inhaler   0   . albuterol (PROVENTIL) (2.5 MG/3ML) 0.083% nebulizer solution   Nebulization   Take 2.5 mg by nebulization every 4 (four) hours as needed for wheezing.         . clindamycin (CLEOCIN) 150 MG capsule   Oral   Take 1 capsule (150 mg total) by mouth every 6 (six) hours.   28 capsule   0   . HYDROcodone-acetaminophen (NORCO/VICODIN) 5-325 MG per tablet   Oral   Take 1 tablet by mouth every 4 (four) hours as needed.   15 tablet   0   . Phenylephrine-Pheniramine-DM (THERAFLU COLD & COUGH) 07-26-19 MG PACK   Oral   Take 1 packet  by mouth as needed (for cold and cough).         . predniSONE (STERAPRED UNI-PAK) 10 MG tablet      6,5,4,3,2,1 - take with food   21 tablet   0   . pseudoephedrine-codeine-guaifenesin (MYTUSSIN DAC) 30-10-100 MG/5ML solution   Oral   Take 5 mLs by mouth every 4 (four) hours as needed for cough.   150 mL   0    Triage Vitals: BP 120/73  Pulse 88  Temp(Src) 98.4 F (36.9 C) (Oral)  Resp 17  SpO2 95%  Physical Exam  Nursing note and vitals reviewed.  CONSTITUTIONAL: Well developed/well nourished HEAD: Normocephalic/atraumatic EYES: EOMI/PERRL ENMT: Mucous membranes moist, no angioedema noted NECK: supple no meningeal signs CV: S1/S2 noted, no murmurs/rubs/gallops noted LUNGS: Lungs are clear to auscultation bilaterally, no apparent distress ABDOMEN: soft, nontender, no rebound or guarding NEURO: Pt is awake/alert, moves all extremitiesx4 EXTREMITIES: pulses normal, full ROM SKIN: warm, color normal, scattered urticaria to the arms, chest and abdomen  PSYCH: no abnormalities of mood noted  ED Course  Procedures (including critical care time)  Medications  ondansetron (ZOFRAN-ODT) disintegrating tablet 8 mg (not administered)  diphenhydrAMINE (BENADRYL) capsule 50 mg (not administered)  dexamethasone (DECADRON) injection 10 mg (not administered)    DIAGNOSTIC STUDIES: Oxygen Saturation is 95% on room air, adequate by my interpretation.    COORDINATION OF CARE: 8:49 AM-Discussed treatment plan which includes medications, PO challenge and UA with pt at bedside and pt agreed to plan. Advised pt that she will need to f/u with her PCP   Labs Review Labs Reviewed - No data to display Imaging Review No results found.  Pt well appearing, taking PO and no diarrhea Stable for d/c and f/u as outpatient   EKG Interpretation   None       MDM  No diagnosis found. Nursing notes including past medical history and social history reviewed and considered in  documentation Labs/vital reviewed and considered   I personally performed the services described in this documentation, which was scribed in my presence. The recorded information has been reviewed and is accurate.      Joya Gaskins, MD 08/22/13 [phone]

## 2013-08-29 ENCOUNTER — Other Ambulatory Visit (HOSPITAL_COMMUNITY): Payer: Self-pay | Admitting: Internal Medicine

## 2013-08-29 DIAGNOSIS — N63 Unspecified lump in unspecified breast: Secondary | ICD-10-CM

## 2013-08-31 ENCOUNTER — Ambulatory Visit (HOSPITAL_COMMUNITY)
Admission: RE | Admit: 2013-08-31 | Discharge: 2013-08-31 | Disposition: A | Discharge status: Home / Self Care | Payer: Medicaid Other | Source: Ambulatory Visit | Attending: Internal Medicine | Admitting: Internal Medicine

## 2013-08-31 DIAGNOSIS — N63 Unspecified lump in unspecified breast: Secondary | ICD-10-CM | POA: Insufficient documentation

## 2013-09-07 ENCOUNTER — Ambulatory Visit (HOSPITAL_COMMUNITY): Payer: Medicaid Other

## 2013-10-12 ENCOUNTER — Encounter (HOSPITAL_COMMUNITY): Payer: Self-pay | Admitting: Emergency Medicine

## 2013-10-12 ENCOUNTER — Emergency Department (HOSPITAL_COMMUNITY)
Admission: EM | Admit: 2013-10-12 | Discharge: 2013-10-12 | Disposition: A | Discharge status: Home / Self Care | Payer: Medicaid Other | Attending: Emergency Medicine | Admitting: Emergency Medicine

## 2013-10-12 DIAGNOSIS — Z8719 Personal history of other diseases of the digestive system: Secondary | ICD-10-CM | POA: Insufficient documentation

## 2013-10-12 DIAGNOSIS — K047 Periapical abscess without sinus: Secondary | ICD-10-CM | POA: Insufficient documentation

## 2013-10-12 DIAGNOSIS — Z79899 Other long term (current) drug therapy: Secondary | ICD-10-CM | POA: Insufficient documentation

## 2013-10-12 DIAGNOSIS — J069 Acute upper respiratory infection, unspecified: Secondary | ICD-10-CM

## 2013-10-12 DIAGNOSIS — J45909 Unspecified asthma, uncomplicated: Secondary | ICD-10-CM | POA: Insufficient documentation

## 2013-10-12 MED ORDER — AMOXICILLIN 500 MG PO CAPS: 500.0000 mg | ORAL_CAPSULE | Freq: Three times a day (TID) | ORAL | Status: DC

## 2013-10-12 MED ORDER — TRAMADOL HCL 50 MG PO TABS: 50.0000 mg | ORAL_TABLET | Freq: Four times a day (QID) | ORAL | Status: DC | PRN

## 2013-10-12 MED ORDER — LORATADINE-PSEUDOEPHEDRINE ER 5-120 MG PO TB12: 1.0000 | ORAL_TABLET | Freq: Two times a day (BID) | ORAL | Status: DC

## 2013-10-12 NOTE — ED Provider Notes (Signed)
CSN: [id number]     Arrival date & time 10/12/13  1553 History   First MD Initiated Contact with Patient 10/12/13 1709     Chief Complaint  Patient presents with  . Dental Pain  . URI   (Consider location/radiation/quality/duration/timing/severity/associated sxs/prior Treatment) HPI Comments: Monica Cox is a 23 y.o. Female presenting with 2 complaints, the first being dental abscess.  She describes swelling around her left lower wisdom tooth which has been painful, and opened this morning while brushing her teeth causing drainage of pus and blood.  There has been no fevers,  Chills, nausea or vomiting, also no complaint of difficulty swallowing,  Although chewing makes pain worse.  The patient has tried no medicines prior to arrival.  Secondly she has developed a URi which includes nasal congestion with clear rhinorrhea, sore throat, low grade fever and nonproductive cough.  Symptoms due to not include shortness of breath, chest pain,  Nausea, vomiting or diarrhea.  The patient has taken no medicines prior to arrival with no significant improvement in symptoms.   The history is provided by the patient.    Past Medical History  Diagnosis Date  . Asthma   . Reflux    Past Surgical History  Procedure Laterality Date  . None     Family History  Problem Relation Age of Onset  . Ulcers Father   . Ulcers Paternal Grandmother   . Ulcers Paternal Aunt   . Liver disease Neg Hx   . Colon cancer Neg Hx    History  Substance Use Topics  . Smoking status: Never Smoker   . Smokeless tobacco: Not on file  . Alcohol Use: Yes     Comment: occasionally   OB History   Grav Para Term Preterm Abortions TAB SAB Ect Mult Living                 Review of Systems  Constitutional: Positive for fever and chills.  HENT: Positive for congestion, dental problem, rhinorrhea and sore throat. Negative for ear pain, facial swelling, sinus pressure, trouble swallowing and voice change.   Eyes:  Negative for discharge.  Respiratory: Positive for cough. Negative for shortness of breath, wheezing and stridor.   Cardiovascular: Negative for chest pain.  Gastrointestinal: Negative for abdominal pain.  Genitourinary: Negative.   Musculoskeletal: Negative for neck pain and neck stiffness.    Allergies  Review of patient's allergies indicates no known allergies.  Home Medications   Current Outpatient Rx  Name  Route  Sig  Dispense  Refill  . albuterol (PROVENTIL HFA;VENTOLIN HFA) 108 (90 BASE) MCG/ACT inhaler   Inhalation   Inhale 2 puffs into the lungs every 6 (six) hours as needed for wheezing or shortness of breath.         Marland Kitchen albuterol (PROVENTIL) (2.5 MG/3ML) 0.083% nebulizer solution   Nebulization   Take 2.5 mg by nebulization every 4 (four) hours as needed for wheezing.         . naproxen sodium (ALEVE) 220 MG tablet   Oral   Take 220 mg by mouth as needed (for pain).         Marland Kitchen amoxicillin (AMOXIL) 500 MG capsule   Oral   Take 1 capsule (500 mg total) by mouth 3 (three) times daily.   30 capsule   0   . loratadine-pseudoephedrine (CLARITIN-D 12 HOUR) 5-120 MG per tablet   Oral   Take 1 tablet by mouth 2 (two) times daily.   20  tablet   0   . traMADol (ULTRAM) 50 MG tablet   Oral   Take 1 tablet (50 mg total) by mouth every 6 (six) hours as needed.   15 tablet   0    BP 120/76  Pulse 81  Temp(Src) 98.1 F (36.7 C) (Oral)  Resp 20  Ht 5\' 3"  (1.6 m)  Wt 168 lb (76.204 kg)  BMI 29.77 kg/m2  SpO2 99%  LMP 10/11/2013 Physical Exam  Constitutional: She is oriented to person, place, and time. She appears well-developed and well-nourished. No distress.  HENT:  Head: Normocephalic and atraumatic.  Right Ear: Tympanic membrane, external ear and ear canal normal.  Left Ear: Tympanic membrane, external ear and ear canal normal.  Nose: Mucosal edema and rhinorrhea present.  Mouth/Throat: Uvula is midline, oropharynx is clear and moist and mucous  membranes are normal. No oral lesions. No trismus in the jaw. Dental abscesses present. No oropharyngeal exudate, posterior oropharyngeal edema, posterior oropharyngeal erythema or tonsillar abscesses.    Eyes: Conjunctivae are normal.  Neck: Normal range of motion. Neck supple.  Cardiovascular: Normal rate and normal heart sounds.   Pulmonary/Chest: Effort normal. No respiratory distress. She has no wheezes. She has no rales.  Abdominal: Soft. She exhibits no distension. There is no tenderness.  Musculoskeletal: Normal range of motion.  Lymphadenopathy:    She has no cervical adenopathy.  Neurological: She is alert and oriented to person, place, and time.  Skin: Skin is warm and dry. No rash noted. No erythema.  Psychiatric: She has a normal mood and affect.    ED Course  Procedures (including critical care time) Labs Review Labs Reviewed - No data to display Imaging Review No results found.  EKG Interpretation   None       MDM   1. Acute URI   2. Dental abscess     Patient was prescribed amoxicillin, Claritin-D and tramadol.  She was encouraged rest, increase fluid intake.  She may take Tylenol or Motrin additionally for pain relief.  Dental referrals were given.  The patient appears reasonably screened and/or stabilized for discharge and I doubt any other medical condition or other [facility] requiring further screening, evaluation, or treatment in the ED at this time prior to discharge.     Burgess AmorJulie Navjot Loera, PA-C 10/12/13 [phone]

## 2013-10-12 NOTE — ED Notes (Signed)
Pt c/o having a "cold" that has drained into her wisdom teeth

## 2013-10-12 NOTE — Discharge Instructions (Signed)
Abscessed Tooth An abscessed tooth is an infection around your tooth. It may be caused by holes or damage to the tooth (cavity) or a dental disease. An abscessed tooth causes mild to very bad pain in and around the tooth. See your dentist right away if you have tooth or gum pain. HOME CARE  Take your medicine as told. Finish it even if you start to feel better.  Do not drive after taking pain medicine.  Rinse your mouth (gargle) often with salt water ( teaspoon salt in 8 ounces of warm water).  Do not apply heat to the outside of your face. GET HELP RIGHT AWAY IF:   You have a temperature by mouth above 102 F (38.9 C), not controlled by medicine.  You have chills and a very bad headache.  You have problems breathing or swallowing.  Your mouth will not open.  You develop puffiness (swelling) on the neck or around the eye.  Your pain is not helped by medicine.  Your pain is getting worse instead of better. MAKE SURE YOU:   Understand these instructions.  Will watch your condition.  Will get help right away if you are not doing well or get worse. Document Released: 03/10/2008 Document Revised: 12/15/2011 Document Reviewed: 12/31/2010 [facility] Patient Information 2014 [place], [place].  Upper Respiratory Infection, Adult An upper respiratory infection (URI) is also sometimes known as the common cold. The upper respiratory tract includes the nose, sinuses, throat, trachea, and bronchi. Bronchi are the airways leading to the lungs. Most people improve within 1 week, but symptoms can last up to 2 weeks. A residual cough may last even longer.  CAUSES Many different viruses can infect the tissues lining the upper respiratory tract. The tissues become irritated and inflamed and often become very moist. Mucus production is also common. A cold is contagious. You can easily spread the virus to others by oral contact. This includes kissing, sharing a glass, coughing, or sneezing. Touching  your mouth or nose and then touching a surface, which is then touched by another person, can also spread the virus. SYMPTOMS  Symptoms typically develop 1 to 3 days after you come in contact with a cold virus. Symptoms vary from person to person. They may include:  Runny nose.  Sneezing.  Nasal congestion.  Sinus irritation.  Sore throat.  Loss of voice (laryngitis).  Cough.  Fatigue.  Muscle aches.  Loss of appetite.  Headache.  Low-grade fever. DIAGNOSIS  You might diagnose your own cold based on familiar symptoms, since most people get a cold 2 to 3 times a year. Your caregiver can confirm this based on your exam. Most importantly, your caregiver can check that your symptoms are not due to another disease such as strep throat, sinusitis, pneumonia, asthma, or epiglottitis. Blood tests, throat tests, and X-rays are not necessary to diagnose a common cold, but they may sometimes be helpful in excluding other more serious diseases. Your caregiver will decide if any further tests are required. RISKS AND COMPLICATIONS  You may be at risk for a more severe case of the common cold if you smoke cigarettes, have chronic heart disease (such as heart failure) or lung disease (such as asthma), or if you have a weakened immune system. The very young and very old are also at risk for more serious infections. Bacterial sinusitis, middle ear infections, and bacterial pneumonia can complicate the common cold. The common cold can worsen asthma and chronic obstructive pulmonary disease (COPD). Sometimes, these complications  can require emergency medical care and may be life-threatening. PREVENTION  The best way to protect against getting a cold is to practice good hygiene. Avoid oral or hand contact with people with cold symptoms. Wash your hands often if contact occurs. There is no clear evidence that vitamin C, vitamin E, echinacea, or exercise reduces the chance of developing a cold. However, it  is always recommended to get plenty of rest and practice good nutrition. TREATMENT  Treatment is directed at relieving symptoms. There is no cure. Antibiotics are not effective, because the infection is caused by a virus, not by bacteria. Treatment may include:  Increased fluid intake. Sports drinks offer valuable electrolytes, sugars, and fluids.  Breathing heated mist or steam (vaporizer or shower).  Eating chicken soup or other clear broths, and maintaining good nutrition.  Getting plenty of rest.  Using gargles or lozenges for comfort.  Controlling fevers with ibuprofen or acetaminophen as directed by your caregiver.  Increasing usage of your inhaler if you have asthma. Zinc gel and zinc lozenges, taken in the first 24 hours of the common cold, can shorten the duration and lessen the severity of symptoms. Pain medicines may help with fever, muscle aches, and throat pain. A variety of non-prescription medicines are available to treat congestion and runny nose. Your caregiver can make recommendations and may suggest nasal or lung inhalers for other symptoms.  HOME CARE INSTRUCTIONS   Only take over-the-counter or prescription medicines for pain, discomfort, or fever as directed by your caregiver.  Use a warm mist humidifier or inhale steam from a shower to increase air moisture. This may keep secretions moist and make it easier to breathe.  Drink enough water and fluids to keep your urine clear or pale yellow.  Rest as needed.  Return to work when your temperature has returned to normal or as your caregiver advises. You may need to stay home longer to avoid infecting others. You can also use a face mask and careful hand washing to prevent spread of the virus. SEEK MEDICAL CARE IF:   After the first few days, you feel you are getting worse rather than better.  You need your caregiver's advice about medicines to control symptoms.  You develop chills, worsening shortness of breath,  or brown or red sputum. These may be signs of pneumonia.  You develop yellow or brown nasal discharge or pain in the face, especially when you bend forward. These may be signs of sinusitis.  You develop a fever, swollen neck glands, pain with swallowing, or white areas in the back of your throat. These may be signs of strep throat. SEEK IMMEDIATE MEDICAL CARE IF:   You have a fever.  You develop severe or persistent headache, ear pain, sinus pain, or chest pain.  You develop wheezing, a prolonged cough, cough up blood, or have a change in your usual mucus (if you have chronic lung disease).  You develop sore muscles or a stiff neck. Document Released: 03/18/2001 Document Revised: 12/15/2011 Document Reviewed: 01/24/2011 [facility] Patient Information 2014 [place]  .Marland Kitchen

## 2013-10-12 NOTE — ED Notes (Signed)
Pt c/o left side dental pain and nasal congestion. Pt reports "bloody pus drainage" from mouth.

## 2013-10-13 NOTE — ED Provider Notes (Signed)
Medical screening examination/treatment/procedure(s) were performed by non-physician practitioner and as supervising physician I was immediately available for consultation/collaboration.  EKG Interpretation   None       Devoria AlbeIva Thuy Atilano, MD, Armando GangFACEP   Ward GivensIva L Tevan Marian, MD 10/13/13 [phone]

## 2014-01-03 ENCOUNTER — Emergency Department (HOSPITAL_COMMUNITY)
Admission: EM | Admit: 2014-01-03 | Discharge: 2014-01-03 | Disposition: A | Discharge status: Home / Self Care | Payer: Medicaid Other | Attending: Emergency Medicine | Admitting: Emergency Medicine

## 2014-01-03 ENCOUNTER — Emergency Department (HOSPITAL_COMMUNITY): Payer: Medicaid Other

## 2014-01-03 ENCOUNTER — Encounter (HOSPITAL_COMMUNITY): Payer: Self-pay | Admitting: Emergency Medicine

## 2014-01-03 DIAGNOSIS — B9789 Other viral agents as the cause of diseases classified elsewhere: Secondary | ICD-10-CM | POA: Insufficient documentation

## 2014-01-03 DIAGNOSIS — Z79899 Other long term (current) drug therapy: Secondary | ICD-10-CM | POA: Insufficient documentation

## 2014-01-03 DIAGNOSIS — Z3202 Encounter for pregnancy test, result negative: Secondary | ICD-10-CM | POA: Insufficient documentation

## 2014-01-03 DIAGNOSIS — R509 Fever, unspecified: Secondary | ICD-10-CM

## 2014-01-03 DIAGNOSIS — B349 Viral infection, unspecified: Secondary | ICD-10-CM

## 2014-01-03 DIAGNOSIS — J45909 Unspecified asthma, uncomplicated: Secondary | ICD-10-CM | POA: Insufficient documentation

## 2014-01-03 DIAGNOSIS — Z8719 Personal history of other diseases of the digestive system: Secondary | ICD-10-CM | POA: Insufficient documentation

## 2014-01-03 LAB — URINALYSIS, ROUTINE W REFLEX MICROSCOPIC
BILIRUBIN URINE: NEGATIVE
GLUCOSE, UA: NEGATIVE mg/dL
KETONES UR: NEGATIVE mg/dL
LEUKOCYTES UA: NEGATIVE
Nitrite: NEGATIVE
PH: 6 (ref 5.0–8.0)
Protein, ur: NEGATIVE mg/dL
Specific Gravity, Urine: >1.03 — ABNORMAL HIGH (ref 1.005–1.030)
Urobilinogen, UA: 2 mg/dL — ABNORMAL HIGH (ref 0.0–1.0)

## 2014-01-03 LAB — RAPID STREP SCREEN (MED CTR MEBANE ONLY): Streptococcus, Group A Screen (Direct): NEGATIVE

## 2014-01-03 LAB — URINE MICROSCOPIC-ADD ON

## 2014-01-03 LAB — PREGNANCY, URINE: Preg Test, Ur: NEGATIVE

## 2014-01-03 MED ORDER — ACETAMINOPHEN 500 MG PO TABS
1000.0000 mg | ORAL_TABLET | Freq: Once | ORAL | Status: AC
  Administered 2014-01-03: 1000 mg via ORAL
  Filled 2014-01-03: qty 2

## 2014-01-03 MED ORDER — BENZONATATE 100 MG PO CAPS: 100.0000 mg | ORAL_CAPSULE | Freq: Three times a day (TID) | ORAL | Status: DC | PRN

## 2014-01-03 MED ORDER — ONDANSETRON HCL 4 MG PO TABS: 4.0000 mg | ORAL_TABLET | Freq: Three times a day (TID) | ORAL | Status: DC | PRN

## 2014-01-03 NOTE — Discharge Instructions (Signed)
°Emergency Department Resource Guide °1) Find a Doctor and Pay Out of Pocket °Although you won't have to find out who is covered by your insurance plan, it is a good idea to ask around and get recommendations. You will then need to call the office and see if the doctor you have chosen will accept you as a new patient and what types of options they offer for patients who are self-pay. Some doctors offer discounts or will set up payment plans for their patients who do not have insurance, but you will need to ask so you aren't surprised when you get to your appointment. ° °2) Contact Your Local Health Department °Not all health departments have doctors that can see patients for sick visits, but many do, so it is worth a call to see if yours does. If you don't know where your local health department is, you can check in your phone book. The CDC also has a tool to help you locate your state's health department, and many state websites also have listings of all of their local health departments. ° °3) Find a Walk-in Clinic °If your illness is not likely to be very severe or complicated, you may want to try a walk in clinic. These are popping up all over the country in pharmacies, drugstores, and shopping centers. They're usually staffed by nurse practitioners or physician assistants that have been trained to treat common illnesses and complaints. They're usually fairly quick and inexpensive. However, if you have serious medical issues or chronic medical problems, these are probably not your best option. ° °No Primary Care Doctor: °- Call [facility] at  832-8000 - they can help you locate a primary care doctor that  accepts your insurance, provides certain services, etc. °- Physician Referral Service- 1-800-533-3463 ° °Chronic Pain Problems: °Organization         Address  Phone   Notes  °[facility]  (336) [phone] Patients need to be referred by their primary care doctor.  ° °Medication  Assistance: °Organization         Address  Phone   Notes  °[place] [facility] [address] °[place], [place] [address] (336) 641-8030 --Must be a resident of [place] °-- Must have NO insurance coverage whatsoever (no Medicaid/ Medicare, etc.) °-- The pt. MUST have a primary care doctor that directs their care regularly and follows them in the community °  °[facility]  (866) 331-1348   °[employer]  (888) 892-1162   ° °Agencies that provide inexpensive medical care: °Organization         Address  Phone   Notes  °[facility]  (336) 832-8035   °[facility]    (336) 832-7272   °[facility] [address] °[place], [place] [address] (336) 832-4777   °[facility] [address], °[place] (336) 271-4999   °[facility]    (336) 373-0678   °[facility]    (336) 272-1050   °[facility] ° [address], [place] Phone:  (336) 832-4444, Fax:  (336) 832-4440 Hours of Operation:  9 am - 6 pm, M-F.  Also accepts Medicaid/Medicare and self-pay.  °[facility] ° [address], [place] Phone: (336) [phone], Fax: (336) [phone]. Hours of Operation:  8:30 am - 5:30 pm, M-F.  Also accepts Medicaid and self-pay.  °[facility] [place] [address], [place] Phone: (336) 878-6027   °[facility] [address], [place], [place] (336)723-1848, Ext. 123 Mondays & Thursdays: 7-9 AM.  First 15 patients are seen on a first come, first serve basis. °  ° °Medicaid-accepting [place] Providers: ° °Organization         Address  Phone   Notes  °[facility] [address], [place] (336) 641-2100 Also accepts self-pay patients.  °[facility] [address], [place] ° (336) 856-9996   °[facility] [address], [place]  (336) 288-8857   °[facility] [address] [place] [address], [place] (336) 299-7000   °Veita Bland [address], [place]  ° (336) 373-1557 Only accepts [place] Access Medicaid patients after they have their name applied to their card.  ° °Self-Pay (no insurance) in [place]: ° °Organization         Address  Phone   Notes  °Sickle Cell Patients, [facility] [address], [place] (336) 832-1970   °[facility] [address], [place] (336) 832-4400   °[facility] ° [address], [place] (336) 992-4800   °[facility]/Dr. Osei-Bonsu ° [address] [place] [address], [place] or [address], [place] (336) [phone] Phone number for both [place] and [place] locations is the same.  °[facility] [address], [place] (336) 299-0000   °[facility] [address] [place] [address], [place] or [address] (336) 852-7530 °(336) 878-2260   °[facility] [address], [place] (336) 350-1642, phone; (336) 294-5005, fax Sees patients 1st and 3rd Saturday of every month.  Must not qualify for public or private insurance (i.e. Medicaid, Medicare, [place] Health Choice, Veterans' Benefits) • Household income should be no more than 200% of the poverty level •The clinic cannot treat you if you are pregnant or think you are pregnant • Sexually transmitted diseases are not treated at the clinic.  ° ° °Dental Care: °Organization         Address  Phone  Notes  °[place] [facility] [address], [place] (336) 641-6152 Accepts children up to age 21 who are enrolled in Medicaid or [place] Health Choice; pregnant women with a Medicaid card; and children who have applied for Medicaid or [place] Health Choice, but were declined, whose parents can pay a reduced fee at time of service.  °[place]  [facility] [place]  [address], [place] (336) 641-7733 Accepts children up to age 21 who are enrolled in Medicaid or [place] Health Choice; pregnant women with a Medicaid card; and children who have applied for Medicaid or [place] Health Choice, but were declined, whose parents can pay a reduced fee at time of service.  °[facility] ° [address], [place] (336) 641-4533 Patients are seen by appointment only. Walk-ins are not accepted. [facility] will see patients 18 years of age and older. °Monday - Tuesday (8am-5pm) °Most Wednesdays (8:30-5pm) °$30 per visit, cash only  °[facility] ° [address], [place] (336) 641-4533 Patients are seen by appointment only. Walk-ins are not accepted. [facility] will see patients 18 years of age and older. °One   Wednesday Evening (Monthly: Volunteer Based).  $30 per visit, cash only  °[facility]  (919) 537-3737 for adults; Children under age 4, call Graduate Pediatric Dentistry at ([phone]) [phone]. Children aged 4-14, please call ([phone]) [phone] to request a pediatric application. ° Dental services are provided in all areas of dental care including fillings, crowns and bridges, complete and partial dentures, implants, gum treatment, root canals, and extractions. Preventive care is also provided. Treatment is provided to both adults and children. °Patients are selected via a lottery and there is often a waiting list. °  °[facility] [address], °[place] ° (336) 763-8833 [email or web address] °  °[facility] [address], [place], [place] (336)723-1848, Ext. 123 Second and Fourth Thursday of each month, opens at 6:30 AM; Clinic ends at 9 AM.  Patients are seen on a first-come first-served basis, and a limited number are seen during each clinic.  ° °[facility] ° [address], [place], [place] (336) 723-7904    Eligibility Requirements °You must have lived in [place], [place], or [place] counties for at least the last three months. °  You cannot be eligible for state or federal sponsored healthcare insurance, including Veterans Administration, Medicaid, or Medicare. °  You generally cannot be eligible for healthcare insurance through your employer.  °  How to apply: °Eligibility screenings are held every Tuesday and Wednesday afternoon from 1:00 pm until 4:00 pm. You do not need an appointment for the interview!  °[facility] [address], [place], [place] 336-631-2330   °[facility]  336-342-8273   °[facility]  336-703-3100   °[facility]  336-570-6415   ° °Behavioral Health Resources in the Community: °Intensive Outpatient Programs °Organization         Address  Phone  Notes  °[facility] [address], [place], [place] 336-878-6098   °[facility] [address], [place], [place] 336-832-9800   °[facility] [address], [place], [place] ° 336-882-2125   °[facility] [address],  °[place], [place] 1-800-853-5163 or 336-641-4981   °Substance Abuse Resources °Organization         Address  Phone  Notes  °[facility]  336-882-2125   °[facility]  336-784-9470   °[facility]  336-285-9073   °[facility]  336-845-3988   °Residential & Outpatient Substance Abuse Program  1-800-659-3381   °Psychological Services °Organization         Address  Phone  Notes  °[facility]  336- 832-9600   °[facility]  336- 378-7881   °[facility] [address], [place] 1-800-853-5163 or 336-641-4981   ° °Mobile Crisis Teams °Organization         Address  Phone  Notes  °[facility], Mobile Crisis Care Unit  1-877-626-1772   °Assertive °[facility] ° [address]  [place], [place] 336-834-9664   °Sharon DeEsch [address] °[place] 336-554-5454   ° °Self-Help/Support Groups °Organization         Address  Phone             Notes  °[facility] of  - variety of support groups  336- 373-1402 Call for more information  °Narcotics Anonymous (NA), [facility] [address], °[place]  2 meetings at this location  ° °  Residential Treatment Programs Organization         Address  Phone  Notes  [facility] [address],    [place]  [phone]   [facility]  [address], [place] [phone], [place], [place] [phone]) [phone]   [facility] [address], [place] [phone]) [phone] Admissions: 8am-3pm M-F  [facility] [address]    [place], [place] [phone])[phone]   [facility] [address], [place], [place] [phone]   [facility] [address]  [place], [place] [phone]   [facility] Intensive Outpatient [address], [place], [place] [phone]   [facility] ([facility] [address]  [place], [place] [phone] or [phone]   [facility] ([facility] [address] [place], [place] [phone] Accepts Medicaid  [facility] [address]  [place] [phone] Substance Abuse/Addiction Treatment   [facility] Organization         Address  Phone  Notes  [facility]  (918)687-5485(888) ([phone]   Angie FavaJulie Brannon, PhD [address], [address] [place], [place]   ([phone](336) [phone] or [phone](336) [phone]   [facility]    [address] [place], [place] [phone]) [phone]   [facility] [address], [place], [place] [phone](336) [phone] Insurance/Medicaid/sponsorship through [facility] [address]                                    [place], [place] [phone](336) [phone] Therapy/tele-psych/case    [facility] [address]   [place], [place] [phone](336) [phone]    Dr. Lolly MustacheArfeen  [phone]) [phone]   [facility]  [employer] [facility] 1) [address], [place] 2) [address], [place] 3)  [address], [place] [phone]) [phone](336) [phone]  [phone](336) [phone]   [facility] Child Abuse Hot[facility] 302-825-7865(336) [phone] or ([phone](336) [phone] (After Hours)       Take over the counter tylenol and ibuprofen, as directed on packaging, as needed for discomfort.  Gargle with warm water several times per day to help with discomfort.  May also use over the counter sore throat pain medicines such as chloraseptic or sucrets, as directed on packaging, as needed for discomfort. Take the prescriptions as directed. Increase your fluid intake (ie:  Gatoraide) for the next few days.  Eat a bland diet and advance to your regular diet slowly as you can tolerate it. Call your regular medical doctor tomorrow to schedule a follow up appointment within the next 2 days.  Return to the Emergency Department immediately if worsening.

## 2014-01-03 NOTE — ED Notes (Signed)
Pt given a coke and crackers 

## 2014-01-03 NOTE — ED Provider Notes (Signed)
CSN: [id number]     Arrival date & time 01/03/14  1648 History   First MD Initiated Contact with Patient 01/03/14 1820     Chief Complaint  Patient presents with  . Sore Throat  . Cough  . Fever  . Emesis      HPI Pt was seen at 1840.  Per pt, c/o gradual onset and persistence of constant sore throat, runny/stuffy nose, sinus congestion, and cough for the past 2 days. Has been associated with subjective home fevers/chills, several intermittent episodes of N/V, as well as generalized body aches/fatigue. Denies rash, no CP/SOB, no diarrhea, no abd pain, no black or blood in stools or emesis.     Past Medical History  Diagnosis Date  . Asthma   . Reflux    Past Surgical History  Procedure Laterality Date  . None    . Esophagogastroduodenoscopy endoscopy     Family History  Problem Relation Age of Onset  . Ulcers Father   . Ulcers Paternal Grandmother   . Ulcers Paternal Aunt   . Liver disease Neg Hx   . Colon cancer Neg Hx    History  Substance Use Topics  . Smoking status: Never Smoker   . Smokeless tobacco: Not on file  . Alcohol Use: Yes     Comment: occasionally    Review of Systems ROS: Statement: All systems negative except as marked or noted in the HPI; Constitutional: +fever and chills, generalized body aches/fatigue.. ; ; Eyes: Negative for eye pain, redness and discharge. ; ; ENMT: Negative for ear pain, hoarseness, +nasal congestion, sinus pressure and sore throat. ; ; Cardiovascular: Negative for chest pain, palpitations, diaphoresis, dyspnea and peripheral edema. ; ; Respiratory: +cough. Negative for wheezing and stridor. ; ; Gastrointestinal: +N/V. Negative for diarrhea, abdominal pain, blood in stool, hematemesis, jaundice and rectal bleeding. . ; ; Genitourinary: Negative for dysuria, flank pain and hematuria. ; ; Musculoskeletal: Negative for back pain and neck pain. Negative for swelling and trauma.; ; Skin: Negative for pruritus, rash, abrasions, blisters,  bruising and skin lesion.; ; Neuro: Negative for headache, lightheadedness and neck stiffness. Negative for weakness, altered level of consciousness , altered mental status, extremity weakness, paresthesias, involuntary movement, seizure and syncope.        Allergies  Review of patient's allergies indicates no known allergies.  Home Medications   Current Outpatient Rx  Name  Route  Sig  Dispense  Refill  . albuterol (PROVENTIL HFA;VENTOLIN HFA) 108 (90 BASE) MCG/ACT inhaler   Inhalation   Inhale 2 puffs into the lungs every 6 (six) hours as needed for wheezing or shortness of breath.         Marland Kitchen albuterol (PROVENTIL) (2.5 MG/3ML) 0.083% nebulizer solution   Nebulization   Take 2.5 mg by nebulization every 4 (four) hours as needed for wheezing.         Marland Kitchen Phenyleph-Doxylamine-DM-APAP (COLD/FLU RELIEF DAY/NIGHT PO)   Oral   Take 5-10 mLs by mouth daily as needed (for cold symptoms).          BP 108/67  Pulse 88  Temp(Src) 101.1 F (38.4 C) (Oral)  Resp 18  Ht 5\' 3"  (1.6 m)  Wt 156 lb (70.761 kg)  BMI 27.64 kg/m2  SpO2 100%  LMP 01/02/2014 Physical Exam 1845: Physical examination:  Nursing notes reviewed; Vital signs and O2 SAT reviewed;  Constitutional: Well developed, Well nourished, Well hydrated, In no acute distress. Talking on ED wall phone, watching TV, and texting on  cellphone on my arrival to ED exam room. ; Head:  Normocephalic, atraumatic; Eyes: EOMI, PERRL, No scleral icterus; ENMT: TM's clear bilat. +edemetous nasal turbinates bilat with clear rhinorrhea. Mouth and pharynx without lesions. No tonsillar exudates. No intra-oral edema. No submandibular or sublingual edema. No hoarse voice, no drooling, no stridor. No pain with manipulation of larynx. No trismus. Mouth and pharynx normal, Mucous membranes moist; Neck: Supple, no meningeal signs. Full range of motion, No lymphadenopathy; Cardiovascular: Regular rate and rhythm, No murmur, rub, or gallop; Respiratory:  Breath sounds clear & equal bilaterally, No rales, rhonchi, wheezes.  Speaking full sentences with ease, Normal respiratory effort/excursion; Chest: Nontender, Movement normal; Abdomen: Soft, Nontender, Nondistended, Normal bowel sounds; Genitourinary: No CVA tenderness; Extremities: Pulses normal, No tenderness, No edema, No calf edema or asymmetry.; Neuro: AA&Ox3, Major CN grossly intact.  Speech clear. No gross focal motor or sensory deficits in extremities. Climbs on and off stretcher easily by herself. Gait steady.; Skin: Color normal, Warm, Dry.   ED Course  Procedures     EKG Interpretation None      MDM  MDM Reviewed: previous chart, nursing note and vitals Interpretation: labs and x-ray     Results for orders placed during the hospital encounter of 01/03/14  RAPID STREP SCREEN      Result Value Ref Range   Streptococcus, Group A Screen (Direct) NEGATIVE  NEGATIVE  URINALYSIS, ROUTINE W REFLEX MICROSCOPIC      Result Value Ref Range   Color, Urine YELLOW  YELLOW   APPearance CLEAR  CLEAR   Specific Gravity, Urine >1.030 (*) 1.005 - 1.030   pH 6.0  5.0 - 8.0   Glucose, UA NEGATIVE  NEGATIVE mg/dL   Hgb urine dipstick TRACE (*) NEGATIVE   Bilirubin Urine NEGATIVE  NEGATIVE   Ketones, ur NEGATIVE  NEGATIVE mg/dL   Protein, ur NEGATIVE  NEGATIVE mg/dL   Urobilinogen, UA 2.0 (*) 0.0 - 1.0 mg/dL   Nitrite NEGATIVE  NEGATIVE   Leukocytes, UA NEGATIVE  NEGATIVE  PREGNANCY, URINE      Result Value Ref Range   Preg Test, Ur NEGATIVE  NEGATIVE  URINE MICROSCOPIC-ADD ON      Result Value Ref Range   Squamous Epithelial / LPF RARE  RARE   WBC, UA 0-2  <3 WBC/hpf   RBC / HPF 0-2  <3 RBC/hpf   Bacteria, UA RARE  RARE   Dg Chest 2 View 01/03/2014   CLINICAL DATA:  Cough, congestion, fever for several days  EXAM: CHEST  2 VIEW  COMPARISON:  None.  FINDINGS: The heart size and mediastinal contours are within normal limits. Both lungs are clear. The visualized skeletal structures  are unremarkable.  IMPRESSION: No active cardiopulmonary disease.   Electronically Signed   By: Esperanza Heiraymond  Rubner M.D.   On: 01/03/2014 19:23    1957:  APAP given for fever. Pt has tol PO well while in the ED without N/V.  No stooling while in the ED.  Abd remains benign, VSS. Feels better and wants to go home now. Will tx symptomatically at this time. Dx and testing d/w pt and family.  Questions answered.  Verb understanding, agreeable to d/c home with outpt f/u.     Laray AngerKathleen M Ary Lavine, DO 01/04/14 2130

## 2014-01-03 NOTE — ED Notes (Signed)
N/V,  Chills, body aches, cough, sore throat.

## 2014-01-06 LAB — CULTURE, GROUP A STREP

## 2014-04-03 ENCOUNTER — Other Ambulatory Visit (HOSPITAL_COMMUNITY): Payer: Medicaid Other

## 2014-04-03 ENCOUNTER — Emergency Department (HOSPITAL_COMMUNITY): Payer: Medicaid Other

## 2014-04-03 ENCOUNTER — Emergency Department (HOSPITAL_COMMUNITY)
Admission: EM | Admit: 2014-04-03 | Discharge: 2014-04-03 | Disposition: A | Discharge status: Home / Self Care | Payer: Medicaid Other | Attending: Emergency Medicine | Admitting: Emergency Medicine

## 2014-04-03 ENCOUNTER — Encounter (HOSPITAL_COMMUNITY): Payer: Self-pay | Admitting: Emergency Medicine

## 2014-04-03 DIAGNOSIS — Z3490 Encounter for supervision of normal pregnancy, unspecified, unspecified trimester: Secondary | ICD-10-CM

## 2014-04-03 DIAGNOSIS — R1032 Left lower quadrant pain: Secondary | ICD-10-CM | POA: Insufficient documentation

## 2014-04-03 DIAGNOSIS — O200 Threatened abortion: Secondary | ICD-10-CM | POA: Insufficient documentation

## 2014-04-03 DIAGNOSIS — J45909 Unspecified asthma, uncomplicated: Secondary | ICD-10-CM | POA: Insufficient documentation

## 2014-04-03 DIAGNOSIS — Z8719 Personal history of other diseases of the digestive system: Secondary | ICD-10-CM | POA: Insufficient documentation

## 2014-04-03 DIAGNOSIS — O9989 Other specified diseases and conditions complicating pregnancy, childbirth and the puerperium: Secondary | ICD-10-CM | POA: Insufficient documentation

## 2014-04-03 DIAGNOSIS — Z79899 Other long term (current) drug therapy: Secondary | ICD-10-CM | POA: Insufficient documentation

## 2014-04-03 LAB — URINALYSIS, ROUTINE W REFLEX MICROSCOPIC
Bilirubin Urine: NEGATIVE
Glucose, UA: NEGATIVE mg/dL
Hgb urine dipstick: NEGATIVE
Ketones, ur: NEGATIVE mg/dL
LEUKOCYTES UA: NEGATIVE
Nitrite: NEGATIVE
PROTEIN: NEGATIVE mg/dL
Urobilinogen, UA: 0.2 mg/dL (ref 0.0–1.0)
pH: 5.5 (ref 5.0–8.0)

## 2014-04-03 LAB — HCG, QUANTITATIVE, PREGNANCY: hCG, Beta Chain, Quant, S: 13960 m[IU]/mL — ABNORMAL HIGH (ref <5)

## 2014-04-03 LAB — PREGNANCY, URINE: PREG TEST UR: POSITIVE — AB

## 2014-04-03 MED ORDER — PRENATAL VITAMINS 0.8 MG PO TABS: 1.0000 | ORAL_TABLET | Freq: Every day | ORAL | Status: DC

## 2014-04-03 NOTE — ED Notes (Signed)
Pt reports to the ED with N/V for past 3 days. Pt reports being able to drink fluids but is unable to eat without vomiting.

## 2014-04-03 NOTE — ED Notes (Signed)
Apologized to pt for the wait. Informed the pt that the EDP would be in to see her as soon as possible. Pt verbalized understanding.

## 2014-04-03 NOTE — ED Provider Notes (Signed)
CSN: [id number]     Arrival date & time 04/03/14  4098 History  This chart was scribed for Vanetta Mulders, MD by Ardelia Mems, ED Scribe. This patient was seen in room APA09/APA09 and the patient's care was started at 12:27 PM.   Chief Complaint  Patient presents with  . Emesis    Patient is a 23 y.o. female presenting with vomiting. The history is provided by the patient. No language interpreter was used.  Emesis Severity:  Moderate Duration:  3 days Timing:  Intermittent Number of daily episodes:  1 episode yesterday and 1 episode 2 days ago, no episodes today Emesis appearance: non-bloody. Able to tolerate:  Liquids Progression:  Improving Chronicity:  New Recent urination:  Normal Context: not post-tussive and not self-induced   Relieved by:  None tried Worsened by:  Nothing tried Ineffective treatments:  None tried Associated symptoms: abdominal pain   Associated symptoms: no chills, no diarrhea, no headaches and no sore throat   Risk factors: no sick contacts     HPI Comments: Monica Cox is a 23 y.o. female who presents to the Emergency Department complaining of nausea with occasional episodes of emesis yesterday and 2 days ago. She reports associated intermittent, sharp LLQ abdominal pain over the past 2 days. She states that this pain does not radiate to her back. She states that there are no modifying factors for the pain. She states that she has only been able to keep down fluids. She denies any prior history of similar pain. She denies any sick contacts. LMP was May 24th. She states that she has been pregnant 2 times in the past- one resulted in a miscarriage, the other in a healthy, live birth.  PCP- Dr. Felecia Shelling   Past Medical History  Diagnosis Date  . Asthma   . Reflux    Past Surgical History  Procedure Laterality Date  . None    . Esophagogastroduodenoscopy endoscopy     Family History  Problem Relation Age of Onset  . Ulcers Father   . Ulcers  Paternal Grandmother   . Ulcers Paternal Aunt   . Liver disease Neg Hx   . Colon cancer Neg Hx    History  Substance Use Topics  . Smoking status: Never Smoker   . Smokeless tobacco: Not on file  . Alcohol Use: Yes     Comment: occasionally   OB History   Grav Para Term Preterm Abortions TAB SAB Ect Mult Living                 Review of Systems  Constitutional: Negative for fever and chills.  HENT: Negative for rhinorrhea and sore throat.   Eyes: Negative for visual disturbance.  Respiratory: Negative for cough and shortness of breath.   Cardiovascular: Negative for chest pain and leg swelling.  Gastrointestinal: Positive for nausea, vomiting and abdominal pain. Negative for diarrhea.  Genitourinary: Negative for dysuria.  Musculoskeletal: Negative for back pain and neck pain.  Skin: Negative for rash.  Neurological: Negative for headaches.  Hematological: Does not bruise/bleed easily.  Psychiatric/Behavioral: Negative for confusion.    Allergies  Review of patient's allergies indicates no known allergies.  Home Medications   Prior to Admission medications   Medication Sig Start Date End Date Taking? Authorizing Provider  albuterol (PROVENTIL HFA;VENTOLIN HFA) 108 (90 BASE) MCG/ACT inhaler Inhale 2 puffs into the lungs every 6 (six) hours as needed for wheezing or shortness of breath.   Yes Historical Provider, MD  Prenatal Multivit-Min-Fe-FA (PRENATAL VITAMINS) 0.8 MG tablet Take 1 tablet by mouth daily. 04/03/14   Vanetta MuldersScott Zackowski, MD   Triage Vitals: BP 119/86  Pulse 94  Temp(Src) 98.4 F (36.9 C) (Oral)  Resp 16  Ht 5\' 3"  (1.6 m)  Wt 159 lb 7 oz (72.32 kg)  BMI 28.25 kg/m2  SpO2 100%  LMP 02/26/2014  Physical Exam  Nursing note and vitals reviewed. Constitutional: She is oriented to person, place, and time. She appears well-developed and well-nourished. No distress.  HENT:  Head: Normocephalic and atraumatic.  Eyes: Conjunctivae and EOM are normal.  Neck:  Neck supple. No tracheal deviation present.  Cardiovascular: Normal rate, regular rhythm and normal heart sounds.   No murmur heard. Pulmonary/Chest: Effort normal and breath sounds normal. No respiratory distress. She has no wheezes. She has no rales.  Lungs CTA bilaterally  Abdominal: Soft. Bowel sounds are normal. There is tenderness (mild, LLQ).  Musculoskeletal: Normal range of motion. She exhibits no edema.  No swelling in ankles  Neurological: She is alert and oriented to person, place, and time.  Skin: Skin is warm and dry.  Psychiatric: She has a normal mood and affect. Her behavior is normal.    ED Course  Procedures (including critical care time)  DIAGNOSTIC STUDIES: Oxygen Saturation is 100% on RA, normal by my interpretation.    COORDINATION OF CARE: 12:31 PM- Discussed positive urine pregnancy result. Will perform US. Pt advised of plan for treatment and pt agrees.  Labs Review Labs Reviewed  URINALYSIS, ROUTINE W REFLEX MICROSCOPIC - Abnormal; Notable for the following:    Specific Gravity, Urine >1.030 (*)    All other components within normal limits  PREGNANCY, URINE - Abnormal; Notable for the following:    Preg Test, Ur POSITIVE (*)    All other components within normal limits  HCG, QUANTITATIVE, PREGNANCY - Abnormal; Notable for the following:    hCG, Beta Chain, Quant, S 13960 (*)    All other components within normal limits   Results for orders placed during the hospital encounter of 04/03/14  URINALYSIS, ROUTINE W REFLEX MICROSCOPIC      Result Value Ref Range   Color, Urine YELLOW  YELLOW   APPearance CLEAR  CLEAR   Specific Gravity, Urine >1.030 (*) 1.005 - 1.030   pH 5.5  5.0 - 8.0   Glucose, UA NEGATIVE  NEGATIVE mg/dL   Hgb urine dipstick NEGATIVE  NEGATIVE   Bilirubin Urine NEGATIVE  NEGATIVE   Ketones, ur NEGATIVE  NEGATIVE mg/dL   Protein, ur NEGATIVE  NEGATIVE mg/dL   Urobilinogen, UA 0.2  0.0 - 1.0 mg/dL   Nitrite NEGATIVE  NEGATIVE    Leukocytes, UA NEGATIVE  NEGATIVE  PREGNANCY, URINE      Result Value Ref Range   Preg Test, Ur POSITIVE (*) NEGATIVE  HCG, QUANTITATIVE, PREGNANCY      Result Value Ref Range   hCG, Beta Chain, Quant, S 13960 (*) <5 mIU/mL     Imaging Review [place] Ob Comp Less 14 Wks  04/03/2014   CLINICAL DATA:  Nausea and left lower quadrant pain  EXAM: OBSTETRIC <14 WK US AND TRANSVAGINAL OB US  TECHNIQUE: Both transabdominal and transvaginal ultrasound examinations were performed for complete evaluation of the gestation as well as the maternal uterus, adnexal regions, and pelvic cul-de-sac. Transvaginal technique was performed to assess early pregnancy.  COMPARISON:  None.  FINDINGS: Intrauterine gestational sac:  single/normal in shape.  Yolk sac:  Present  Embryo:  Probable  Cardiac Activity: None visualized  Heart Rate:  n/a bpm  CRL:   2.6  cm   5 w 6 d                  US EDC: November 28, 2014  Maternal uterus/adnexaKoreae: No abnormalities demonstrated  IMPRESSION: An early IUP is demonstrated. It is likely nonviable in that no cardiac activity could be demonstrated, but this will merit confirmation via serial follow-up ultrasound examinations as well as beta HCG determinations.   Electronically Signed   By: David  [place]   On: 04/03/2014 14:47   [place] Ob Transvaginal  04/03/2014   CLINICAL DATA:  Nausea and left lower quadrant pain  EXAM: OBSTETRIC <14 WK US AND TRANSVAGINAL OB US  TECHNIQUE: Both transabdominal and transvaginal ultrasound examinations were performed for complete evaluation of the gestation as well as the maternal uterus, adnexal regions, and pelvic cul-de-sac. Transvaginal technique was performed to assess early pregnancy.  COMPARISON:  None.  FINDINGS: Intrauterine gestational sac:  single/normal in shape.  Yolk sac:  Present  Embryo:  Probable  Cardiac Activity: None visualized  Heart Rate:  n/a bpm  CRL:   2.6  cm   5 w 6 d                  US EDC: November 28, 2014  Maternal uterus/adnexae: No  abnormalities demonstrated  IMPRESSION: An early IUP is demonstrated. It is likely nonviable in that no cardiac activity could be demonstrated, but this will merit confirmation via serial follow-up ultrasound examinations as well as beta HCG determinations.   Electronically Signed   By: David  [place]   On: 04/03/2014 14:47     EKG Interpretation None      MDM   Final diagnoses:  Pregnancy  Threatened miscarriage in early pregnancy    Ultrasound shows early IUP about 5 weeks. No fetal heart activity this may represent concern for threatened miscarriage. Patient informed. No evidence of ectopic. Patient given OB/GYN followup and started on prenatal vitamins. Patient can have hCG rechecked in 2-3 days and this will give information about live pregnancy is progressing properly. Patient nontoxic no acute distress. No significant abdominal pain. Vomiting is rare no persistent vomiting.  I personally performed the services described in this documentation, which was scribed in my presence. The recorded information has been reviewed and is accurate.    Vanetta MuldersScott Zackowski, MD 04/03/14 1504

## 2014-04-03 NOTE — Discharge Instructions (Signed)
Followup with OB/GYN referral information provided. Start taking prenatal vitamins. Return for significant vaginal bleeding or any newer worse abdominal pain or persistent vomiting or fever. Due to the fact that her having pain early on in pregnancy this may represent a threatened miscarriage repeat pregnancy hormone number and a the next 2-3 days will give an indication of pregnancy is continued to develop properly.

## 2014-04-06 ENCOUNTER — Encounter (HOSPITAL_COMMUNITY): Payer: Self-pay | Admitting: Emergency Medicine

## 2014-04-06 ENCOUNTER — Emergency Department (HOSPITAL_COMMUNITY)
Admission: EM | Admit: 2014-04-06 | Discharge: 2014-04-06 | Disposition: A | Discharge status: Home / Self Care | Payer: Medicaid Other | Attending: Emergency Medicine | Admitting: Emergency Medicine

## 2014-04-06 DIAGNOSIS — O9989 Other specified diseases and conditions complicating pregnancy, childbirth and the puerperium: Secondary | ICD-10-CM | POA: Insufficient documentation

## 2014-04-06 DIAGNOSIS — R109 Unspecified abdominal pain: Secondary | ICD-10-CM | POA: Insufficient documentation

## 2014-04-06 DIAGNOSIS — Z79899 Other long term (current) drug therapy: Secondary | ICD-10-CM | POA: Insufficient documentation

## 2014-04-06 DIAGNOSIS — Z8719 Personal history of other diseases of the digestive system: Secondary | ICD-10-CM | POA: Insufficient documentation

## 2014-04-06 DIAGNOSIS — Z3490 Encounter for supervision of normal pregnancy, unspecified, unspecified trimester: Secondary | ICD-10-CM

## 2014-04-06 DIAGNOSIS — J45909 Unspecified asthma, uncomplicated: Secondary | ICD-10-CM | POA: Insufficient documentation

## 2014-04-06 LAB — HCG, QUANTITATIVE, PREGNANCY: HCG, BETA CHAIN, QUANT, S: 32256 m[IU]/mL — AB (ref <5)

## 2014-04-06 NOTE — ED Provider Notes (Signed)
CSN: [id number]     Arrival date & time 04/06/14  1704 History   First MD Initiated Contact with Patient 04/06/14 1723     Chief Complaint  Patient presents with  . Abdominal Pain     (Consider location/radiation/quality/duration/timing/severity/associated sxs/prior Treatment) Patient is a 23 y.o. female presenting with abdominal pain. The history is provided by the patient.  Abdominal Pain Associated symptoms: no chest pain, no dysuria, no fever, no shortness of breath and no vaginal bleeding    patient seen by me June 29 the with the diagnosis of pregnancy. Patient was having some lower quadrant abdominal discomfort at that time. Ultrasound showed an IUP but no definitive fetal heart beats. Patient quantitative hCG done at that time and was told to followup with her OB/GYN that in the to 3 days quantitative hCG to be repeated and if it was increasing and this probably was not a threatened miscarriage. Patient returns today with no new symptoms. Still has the same discomfort she had before. No vaginal bleeding.  Past Medical History  Diagnosis Date  . Asthma   . Reflux   . Pregnant    Past Surgical History  Procedure Laterality Date  . None    . Esophagogastroduodenoscopy endoscopy     Family History  Problem Relation Age of Onset  . Ulcers Father   . Ulcers Paternal Grandmother   . Ulcers Paternal Aunt   . Liver disease Neg Hx   . Colon cancer Neg Hx    History  Substance Use Topics  . Smoking status: Never Smoker   . Smokeless tobacco: Not on file  . Alcohol Use: Yes     Comment: occasionally   OB History   Grav Para Term Preterm Abortions TAB SAB Ect Mult Living                 Review of Systems  Constitutional: Negative for fever.  HENT: Negative for congestion.   Eyes: Negative for visual disturbance.  Respiratory: Negative for shortness of breath.   Cardiovascular: Negative for chest pain.  Gastrointestinal: Positive for abdominal pain.  Genitourinary:  Negative for dysuria and vaginal bleeding.  Musculoskeletal: Negative for back pain.  Skin: Positive for rash.  Neurological: Negative for headaches.  Hematological: Does not bruise/bleed easily.  Psychiatric/Behavioral: Negative for confusion.      Allergies  Review of patient's allergies indicates no known allergies.  Home Medications   Prior to Admission medications   Medication Sig Start Date End Date Taking? Authorizing Provider  albuterol (PROVENTIL HFA;VENTOLIN HFA) 108 (90 BASE) MCG/ACT inhaler Inhale 2 puffs into the lungs every 6 (six) hours as needed for wheezing or shortness of breath.    Historical Provider, MD  Prenatal Multivit-Min-Fe-FA (PRENATAL VITAMINS) 0.8 MG tablet Take 1 tablet by mouth daily. 04/03/14   Vanetta MuldersScott Chaneka Trefz, MD   BP 117/72  Pulse 97  Temp(Src) 98.5 F (36.9 C)  Resp 20  Ht 5' 3.5" (1.613 m)  Wt 159 lb (72.122 kg)  BMI 27.72 kg/m2  SpO2 100%  LMP 02/26/2014 Physical Exam  Nursing note and vitals reviewed. Constitutional: She is oriented to person, place, and time. She appears well-developed and well-nourished. No distress.  HENT:  Head: Normocephalic and atraumatic.  Eyes: Conjunctivae and EOM are normal. Pupils are equal, round, and reactive to light.  Neck: Normal range of motion.  Cardiovascular: Normal rate, regular rhythm and normal heart sounds.   Pulmonary/Chest: Effort normal and breath sounds normal. No respiratory distress.  Abdominal: Soft.  Bowel sounds are normal. There is no tenderness.  Musculoskeletal: Normal range of motion. She exhibits no edema.  Neurological: She is alert and oriented to person, place, and time. No cranial nerve deficit. She exhibits normal muscle tone. Coordination normal.  Skin: Skin is warm. No rash noted.    ED Course  Procedures (including critical care time) Labs Review Labs Reviewed  HCG, QUANTITATIVE, PREGNANCY - Abnormal; Notable for the following:    hCG, Beta Chain, Quant, S 32256 (*)     All other components within normal limits    Imaging Review No results found.   EKG Interpretation None      MDM   Final diagnoses:  Pregnancy    Patient seen by me on June 29. The diagnosis of pregnancy IUP but no fetal heart tones was question whether was a threatened miscarriage. Patient presents again today for a repeat quantitative hCG. No change in her symptoms no vaginal bleeding. Still with some mild lower quadrant abdominal discomfort. Patient did not start prenatal vitamins. Patient does have an OB/GYN Dr. in the area. Today's quantitative hCG shows a marked increase in the level consistent with a developing pregnancy. Patient will follow up with her OB/GYN patient will start her prenatal vitamins.    Vanetta MuldersScott [place], MD 04/06/14 [phone]

## 2014-04-06 NOTE — ED Notes (Addendum)
Seen here 6/29 and found out she was pregnant, Told to return to check labs.  Has low abd pain.

## 2014-04-06 NOTE — Discharge Instructions (Signed)
Follow[place] with [place] in [place]. Start taking your prenatal vitamins. Today's quantitative hCG shows that the pregnancy is developing appropriately at this point in time. Return for any new or worse symptoms.

## 2014-04-14 ENCOUNTER — Other Ambulatory Visit: Payer: Self-pay | Admitting: Obstetrics and Gynecology

## 2014-04-14 DIAGNOSIS — O3680X0 Pregnancy with inconclusive fetal viability, not applicable or unspecified: Secondary | ICD-10-CM

## 2014-04-18 ENCOUNTER — Other Ambulatory Visit: Payer: Self-pay | Admitting: Obstetrics and Gynecology

## 2014-04-18 ENCOUNTER — Ambulatory Visit (INDEPENDENT_AMBULATORY_CARE_PROVIDER_SITE_OTHER): Payer: Medicaid Other

## 2014-04-18 DIAGNOSIS — O09299 Supervision of pregnancy with other poor reproductive or obstetric history, unspecified trimester: Secondary | ICD-10-CM

## 2014-04-18 DIAGNOSIS — O3680X0 Pregnancy with inconclusive fetal viability, not applicable or unspecified: Secondary | ICD-10-CM

## 2014-04-18 NOTE — Progress Notes (Signed)
U/S(7+2wks)-single IUP with +FCA noted FHR-153 bpm, CRL c/w LMP dates, cx appears closed, bilateral adnexa appears WNL

## 2014-04-25 ENCOUNTER — Encounter: Payer: Medicaid Other | Admitting: Women's Health

## 2014-04-26 ENCOUNTER — Other Ambulatory Visit (HOSPITAL_COMMUNITY)
Admission: RE | Admit: 2014-04-26 | Discharge: 2014-04-26 | Disposition: A | Discharge status: Home / Self Care | Payer: Medicaid Other | Source: Ambulatory Visit | Attending: Obstetrics & Gynecology | Admitting: Obstetrics & Gynecology

## 2014-04-26 ENCOUNTER — Ambulatory Visit (INDEPENDENT_AMBULATORY_CARE_PROVIDER_SITE_OTHER): Payer: Medicaid Other | Admitting: Women's Health

## 2014-04-26 ENCOUNTER — Encounter: Payer: Self-pay | Admitting: Women's Health

## 2014-04-26 VITALS — BP 104/66 | Wt 156.0 lb

## 2014-04-26 DIAGNOSIS — Z01419 Encounter for gynecological examination (general) (routine) without abnormal findings: Secondary | ICD-10-CM | POA: Insufficient documentation

## 2014-04-26 DIAGNOSIS — Z3481 Encounter for supervision of other normal pregnancy, first trimester: Secondary | ICD-10-CM

## 2014-04-26 DIAGNOSIS — Z113 Encounter for screening for infections with a predominantly sexual mode of transmission: Secondary | ICD-10-CM | POA: Diagnosis present

## 2014-04-26 DIAGNOSIS — Z3480 Encounter for supervision of other normal pregnancy, unspecified trimester: Secondary | ICD-10-CM

## 2014-04-26 DIAGNOSIS — Z331 Pregnant state, incidental: Secondary | ICD-10-CM

## 2014-04-26 DIAGNOSIS — Z1389 Encounter for screening for other disorder: Secondary | ICD-10-CM

## 2014-04-26 LAB — POCT URINALYSIS DIPSTICK
Blood, UA: NEGATIVE
Glucose, UA: NEGATIVE
KETONES UA: NEGATIVE
Leukocytes, UA: NEGATIVE
Nitrite, UA: NEGATIVE

## 2014-04-26 MED ORDER — OB COMPLETE PETITE 35-5-1-200 MG PO CAPS: 1.0000 | ORAL_CAPSULE | Freq: Every day | ORAL | Status: DC

## 2014-04-26 MED ORDER — DOXYLAMINE-PYRIDOXINE 10-10 MG PO TBEC: 10.0000 mg | DELAYED_RELEASE_TABLET | ORAL | Status: DC

## 2014-04-26 MED ORDER — PANTOPRAZOLE SODIUM 20 MG PO TBEC: 20.0000 mg | DELAYED_RELEASE_TABLET | Freq: Every day | ORAL | Status: DC

## 2014-04-26 NOTE — Progress Notes (Addendum)
  Subjective:  Monica Cox is a 23 y.o. [id number] African American female at 7529w3d by LMP c/w 7wk u/s, being seen today for her first obstetrical visit.  Her obstetrical history is significant for term SVB x 1 w/ immediate pp HTN- not sure if pre-e or just HTN per pt report.  Has GERD, not taking meds now, but feels like she needs them- requests rx. Requests rx for pnv that has coating-easier to swallow. Pregnancy history fully reviewed.  Patient reports n/v, postnasal drainage. Denies vb, cramping, uti s/s, abnormal/malodorous vag d/c, or vulvovaginal itching/irritation.  BP 104/66  Wt 156 lb (70.761 kg)  LMP 02/26/2014  HISTORY: OB History  Gravida Para Term Preterm AB SAB TAB Ectopic Multiple Living  3 1 0 0 1 1 0 0 0 1     # Outcome Date GA Lbr Len/2nd Weight Sex Delivery Anes PTL Lv  3 CUR           2 PAR 04/24/09    M SVD   Y  1 SAB              Past Medical History  Diagnosis Date  . Asthma   . Reflux   . Pregnant    Past Surgical History  Procedure Laterality Date  . None    . Esophagogastroduodenoscopy endoscopy    . Dilation and curettage of uterus      MAB Dr. Despina HiddenEure performed   Family History  Problem Relation Age of Onset  . Ulcers Father   . Ulcers Paternal Grandmother   . Ulcers Paternal Aunt   . Liver disease Neg Hx   . Colon cancer Neg Hx     Exam   System:     General: Well developed & nourished, no acute distress   Skin: Warm & dry, normal coloration and turgor, no rashes   Neurologic: Alert & oriented, normal mood   Cardiovascular: Regular rate & rhythm   Respiratory: Effort & rate normal, LCTAB, acyanotic   Abdomen: Soft, non tender   Extremities: normal strength, tone   Pelvic Exam:    Perineum: Normal perineum   Vulva: Normal, no lesions   Vagina:  Normal mucosa, normal discharge   Cervix: Normal, bulbous, appears closed   Uterus: Normal size/shape/contour for GA   Thin prep pap smear obtained w/ reflex high risk HPV FHR: 160 via  informal transabdominal u/s   Assessment:   Pregnancy: [id number] Patient Active Problem List   Diagnosis Date Noted  . Supervision of other normal pregnancy 04/26/2014    Priority: High  . Hematemesis 02/20/2011  . Epigastric pain 02/20/2011    329w3d G3P0011 New OB visit N/V of pregnancy H/O pp HTN GERD  Plan:  Initial labs drawn Continue prenatal vitamins Problem list reviewed and updated Reviewed n/v relief measures and warning s/s to report Rx diclegis, prior auth sent Rx protonix 20mg  daily Rx ob complete petite Reviewed recommended weight gain based on pre-gravid BMI Encouraged well-balanced diet Genetic Screening discussed Integrated Screen: requested Cystic fibrosis screening discussed requested Ultrasound discussed; fetal survey: requested Follow up in 4 weeks for 1st it/nt and lrob [facility]NC completed [facility]  [name], Wasil Wolke Randall CNM, [facility] NorthWHNP-BC 04/26/2014 2:29 PM

## 2014-04-26 NOTE — Patient Instructions (Addendum)
Nausea & Vomiting  Have saltine crackers or pretzels by your bed and eat a few bites before you raise your head out of bed in the morning  Eat small frequent meals throughout the day instead of large meals  Drink plenty of fluids throughout the day to stay hydrated, just don't drink a lot of fluids with your meals.  This can make your stomach fill up faster making you feel sick  Do not brush your teeth right after you eat  Products with real ginger are good for nausea, like ginger ale and ginger hard candy Make sure it says made with real ginger!  Sucking on sour candy like lemon heads is also good for nausea  If your prenatal vitamins make you nauseated, take them at night so you will sleep through the nausea  If you feel like you need medicine for the nausea & vomiting please let us know  If you are unable to keep any fluids or food down please let us know  [employer]  Pregnancy - First Trimester During sexual intercourse, millions of sperm go into the vagina. Only 1 sperm will penetrate and fertilize the female egg while it is in the Fallopian tube. One week later, the fertilized egg implants into the wall of the uterus. An embryo begins to develop into a baby. At 6 to 8 weeks, the eyes and face are formed and the heartbeat can be seen on ultrasound. At the end of 12 weeks (first trimester), all the baby's organs are formed. Now that you are pregnant, you will want to do everything you can to have a healthy baby. Two of the most important things are to get good prenatal care and follow your caregiver's instructions. Prenatal care is all the medical care you receive before the baby's birth. It is given to prevent, find, and treat problems during the pregnancy and childbirth. PRENATAL EXAMS  During prenatal visits, your weight, blood pressure, and urine are checked. This is done to make sure you are healthy and progressing normally during the pregnancy.  A pregnant woman should gain 25 to  35 pounds during the pregnancy. However, if you are overweight or underweight, your caregiver will advise you regarding your weight.  Your caregiver will ask and answer questions for you.  Blood work, cervical cultures, other necessary tests, and a Pap test are done during your prenatal exams. These tests are done to check on your health and the probable health of your baby. Tests are strongly recommended and done for HIV with your permission. This is the virus that causes AIDS. These tests are done because medicines can be given to help prevent your baby from being born with this infection should you have been infected without knowing it. Blood work is also used to find out your blood type, previous infections, and follow your blood levels (hemoglobin).  Low hemoglobin (anemia) is common during pregnancy. Iron and vitamins are given to help prevent this. Later in the pregnancy, blood tests for diabetes will be done along with any other tests if any problems develop.  You may need other tests to make sure you and the baby are doing well. CHANGES DURING THE FIRST TRIMESTER  Your body goes through many changes during pregnancy. They vary from person to person. Talk to your caregiver about changes you notice and are concerned about. Changes can include:  Your menstrual period stops.  The egg and sperm carry the genes that determine what you look like. Genes from   you and your partner are forming a baby. The female genes determine whether the baby is a boy or a girl.  Your body increases in girth and you may feel bloated.  Feeling sick to your stomach (nauseous) and throwing up (vomiting). If the vomiting is uncontrollable, call your caregiver.  Your breasts will begin to enlarge and become tender.  Your nipples may stick out more and become darker.  The need to urinate more. Painful urination may mean you have a bladder infection.  Tiring easily.  Loss of appetite.  Cravings for certain kinds  of food.  At first, you may gain or lose a couple of pounds.  You may have changes in your emotions from day to day (excited to be pregnant or concerned something may go wrong with the pregnancy and baby).  You may have more vivid and strange dreams. HOME CARE INSTRUCTIONS   It is very important to avoid all smoking, alcohol and non-prescribed drugs during your pregnancy. These affect the formation and growth of the baby. Avoid chemicals while pregnant to ensure the delivery of a healthy infant.  Start your prenatal visits by the 12th week of pregnancy. They are usually scheduled monthly at first, then more often in the last 2 months before delivery. Keep your caregiver's appointments. Follow your caregiver's instructions regarding medicine use, blood and lab tests, exercise, and diet.  During pregnancy, you are providing food for you and your baby. Eat regular, well-balanced meals. Choose foods such as meat, fish, milk and other low fat dairy products, vegetables, fruits, and whole-grain breads and cereals. Your caregiver will tell you of the ideal weight gain.  You can help morning sickness by keeping soda crackers at the bedside. Eat a couple before arising in the morning. You may want to use the crackers without salt on them.  Eating 4 to 5 small meals rather than 3 large meals a day also may help the nausea and vomiting.  Drinking liquids between meals instead of during meals also seems to help nausea and vomiting.  A physical sexual relationship may be continued throughout pregnancy if there are no other problems. Problems may be early (premature) leaking of amniotic fluid from the membranes, vaginal bleeding, or belly (abdominal) pain.  Exercise regularly if there are no restrictions. Check with your caregiver or physical therapist if you are unsure of the safety of some of your exercises. Greater weight gain will occur in the last 2 trimesters of pregnancy. Exercising will  help:  Control your weight.  Keep you in shape.  Prepare you for labor and delivery.  Help you lose your pregnancy weight after you deliver your baby.  Wear a good support or jogging bra for breast tenderness during pregnancy. This may help if worn during sleep too.  Ask when prenatal classes are available. Begin classes when they are offered.  Do not use hot tubs, steam rooms, or saunas.  Wear your seat belt when driving. This protects you and your baby if you are in an accident.  Avoid raw meat, uncooked cheese, cat litter boxes, and soil used by cats throughout the pregnancy. These carry germs that can cause birth defects in the baby.  The first trimester is a good time to visit your dentist for your dental health. Getting your teeth cleaned is okay. Use a softer toothbrush and brush gently during pregnancy.  Ask for help if you have financial, counseling, or nutritional needs during pregnancy. Your caregiver will be able to offer counseling   for these needs as well as refer you for other special needs.  Do not take any medicines or herbs unless told by your caregiver.  Inform your caregiver if there is any mental or physical domestic violence.  Make a list of emergency phone numbers of family, friends, hospital, and police and fire departments.  Write down your questions. Take them to your prenatal visit.  Do not douche.  Do not cross your legs.  If you have to stand for long periods of time, rotate you feet or take small steps in a circle.  You may have more vaginal secretions that may require a sanitary pad. Do not use tampons or scented sanitary pads. MEDICINES AND DRUG USE IN PREGNANCY  Take prenatal vitamins as directed. The vitamin should contain 1 milligram of folic acid. Keep all vitamins out of reach of children. Only a couple vitamins or tablets containing iron may be fatal to a baby or young child when ingested.  Avoid use of all medicines, including herbs,  over-the-counter medicines, not prescribed or suggested by your caregiver. Only take over-the-counter or prescription medicines for pain, discomfort, or fever as directed by your caregiver. Do not use aspirin, ibuprofen, or naproxen unless directed by your caregiver.  Let your caregiver also know about herbs you may be using.  Alcohol is related to a number of birth defects. This includes fetal alcohol syndrome. All alcohol, in any form, should be avoided completely. Smoking will cause low birth rate and premature babies.  Street or illegal drugs are very harmful to the baby. They are absolutely forbidden. A baby born to an addicted mother will be addicted at birth. The baby will go through the same withdrawal an adult does.  Let your caregiver know about any medicines that you have to take and for what reason you take them. SEEK MEDICAL CARE IF:  You have any concerns or worries during your pregnancy. It is better to call with your questions if you feel they cannot wait, rather than worry about them. SEEK IMMEDIATE MEDICAL CARE IF:   An unexplained oral temperature above 102 F (38.9 C) develops, or as your caregiver suggests.  You have leaking of fluid from the vagina (birth canal). If leaking membranes are suspected, take your temperature and inform your caregiver of this when you call.  There is vaginal spotting or bleeding. Notify your caregiver of the amount and how many pads are used.  You develop a bad smelling vaginal discharge with a change in the color.  You continue to feel sick to your stomach (nauseated) and have no relief from remedies suggested. You vomit blood or coffee ground-like materials.  You lose more than 2 pounds of weight in 1 week.  You gain more than 2 pounds of weight in 1 week and you notice swelling of your face, hands, feet, or legs.  You gain 5 pounds or more in 1 week (even if you do not have swelling of your hands, face, legs, or feet).  You get  exposed to German measles and have never had them.  You are exposed to fifth disease or chickenpox.  You develop belly (abdominal) pain. Round ligament discomfort is a common non-cancerous (benign) cause of abdominal pain in pregnancy. Your caregiver still must evaluate this.  You develop headache, fever, diarrhea, pain with urination, or shortness of breath.  You fall or are in a car accident or have any kind of trauma.  There is mental or physical violence in your home.   Document Released: 09/16/2001 Document Revised: 06/16/2012 Document Reviewed: 08/02/2013 [employer] Patient Information 2015 [employer]. This information is not intended to replace advice given to you by your health care provider. Make sure you discuss any questions you have with your health care provider.     

## 2014-04-27 LAB — CBC
HCT: 35.6 % — ABNORMAL LOW (ref 36.0–46.0)
HEMOGLOBIN: 12 g/dL (ref 12.0–15.0)
MCH: 30.6 pg (ref 26.0–34.0)
MCHC: 33.7 g/dL (ref 30.0–36.0)
MCV: 90.8 fL (ref 78.0–100.0)
Platelets: 271 10*3/uL (ref 150–400)
RBC: 3.92 MIL/uL (ref 3.87–5.11)
RDW: 13.2 % (ref 11.5–15.5)
WBC: 4.6 10*3/uL (ref 4.0–10.5)

## 2014-04-27 LAB — URINALYSIS, MICROSCOPIC ONLY
BACTERIA UA: NONE SEEN
CASTS: NONE SEEN
Crystals: NONE SEEN

## 2014-04-27 LAB — VARICELLA ZOSTER ANTIBODY, IGG: VARICELLA IGG: 890.6 {index} — AB (ref <135.00)

## 2014-04-27 LAB — ANTIBODY SCREEN: Antibody Screen: NEGATIVE

## 2014-04-27 LAB — HEPATITIS B SURFACE ANTIGEN: Hepatitis B Surface Ag: NEGATIVE

## 2014-04-27 LAB — DRUG SCREEN, URINE, NO CONFIRMATION
Amphetamine Screen, Ur: NEGATIVE
Barbiturate Quant, Ur: NEGATIVE
Benzodiazepines.: NEGATIVE
COCAINE METABOLITES: NEGATIVE
Creatinine,U: 286 mg/dL
Marijuana Metabolite: NEGATIVE
Methadone: NEGATIVE
Opiate Screen, Urine: NEGATIVE
PROPOXYPHENE: NEGATIVE
Phencyclidine (PCP): NEGATIVE

## 2014-04-27 LAB — URINALYSIS, ROUTINE W REFLEX MICROSCOPIC
Bilirubin Urine: NEGATIVE
Glucose, UA: NEGATIVE mg/dL
HGB URINE DIPSTICK: NEGATIVE
Ketones, ur: NEGATIVE mg/dL
NITRITE: NEGATIVE
Protein, ur: NEGATIVE mg/dL
Specific Gravity, Urine: 1.023 (ref 1.005–1.030)
Urobilinogen, UA: 1 mg/dL (ref 0.0–1.0)
pH: 6 (ref 5.0–8.0)

## 2014-04-27 LAB — RPR

## 2014-04-27 LAB — OXYCODONE SCREEN, UA, RFLX CONFIRM: OXYCODONE SCRN UR: NEGATIVE ng/mL

## 2014-04-27 LAB — SICKLE CELL SCREEN: SICKLE CELL SCREEN: NEGATIVE

## 2014-04-27 LAB — ABO AND RH: RH TYPE: NEGATIVE

## 2014-04-27 LAB — HIV ANTIBODY (ROUTINE TESTING W REFLEX): HIV 1&2 Ab, 4th Generation: NONREACTIVE

## 2014-04-27 LAB — RUBELLA SCREEN: Rubella: 1.37 Index — ABNORMAL HIGH (ref <0.90)

## 2014-04-28 LAB — CYTOLOGY - PAP

## 2014-04-28 LAB — URINE CULTURE: Colony Count: 4000

## 2014-04-28 LAB — CYSTIC FIBROSIS DIAGNOSTIC STUDY

## 2014-05-01 ENCOUNTER — Encounter: Payer: Self-pay | Admitting: Women's Health

## 2014-05-01 ENCOUNTER — Telehealth: Payer: Self-pay | Admitting: Women's Health

## 2014-05-01 DIAGNOSIS — Z3481 Encounter for supervision of other normal pregnancy, first trimester: Secondary | ICD-10-CM

## 2014-05-01 DIAGNOSIS — A749 Chlamydial infection, unspecified: Secondary | ICD-10-CM | POA: Insufficient documentation

## 2014-05-01 DIAGNOSIS — O98811 Other maternal infectious and parasitic diseases complicating pregnancy, first trimester: Secondary | ICD-10-CM

## 2014-05-01 DIAGNOSIS — O26899 Other specified pregnancy related conditions, unspecified trimester: Secondary | ICD-10-CM

## 2014-05-01 DIAGNOSIS — Z6791 Unspecified blood type, Rh negative: Secondary | ICD-10-CM | POA: Insufficient documentation

## 2014-05-01 NOTE — Telephone Encounter (Signed)
Trying to contact pt to notify her of +CT. VM box not set up yet- unable to leave message, will continue trying to call.  Cheral MarkerKimberly R. Kmya Placide, CNM, [facility]NP-BC 05/01/2014 1:37 PM

## 2014-05-02 ENCOUNTER — Telehealth: Payer: Self-pay | Admitting: Women's Health

## 2014-05-02 DIAGNOSIS — O98811 Other maternal infectious and parasitic diseases complicating pregnancy, first trimester: Principal | ICD-10-CM

## 2014-05-02 DIAGNOSIS — A749 Chlamydial infection, unspecified: Secondary | ICD-10-CM

## 2014-05-02 MED ORDER — AZITHROMYCIN 500 MG PO TABS: 1000.0000 mg | ORAL_TABLET | Freq: Once | ORAL | Status: DC

## 2014-05-02 NOTE — Telephone Encounter (Signed)
Notified pt of +CT, rx at her pharmacy. States she is not currently w/ anyone sexually. Advised her to notify sexual partners she has had w/in last 6 months so they can be treated. Reviewed all other normal pn1 results as well.  Cheral MarkerKimberly R. Candie Gintz, CNM, [facility] 05/02/2014 1:27 PM

## 2014-05-17 ENCOUNTER — Other Ambulatory Visit: Payer: Self-pay | Admitting: Women's Health

## 2014-05-17 ENCOUNTER — Other Ambulatory Visit: Payer: Self-pay | Admitting: Obstetrics and Gynecology

## 2014-05-17 DIAGNOSIS — Z36 Encounter for antenatal screening of mother: Secondary | ICD-10-CM

## 2014-05-22 ENCOUNTER — Telehealth: Payer: Self-pay | Admitting: Women's Health

## 2014-05-22 DIAGNOSIS — Z3481 Encounter for supervision of other normal pregnancy, first trimester: Secondary | ICD-10-CM

## 2014-05-22 DIAGNOSIS — O98811 Other maternal infectious and parasitic diseases complicating pregnancy, first trimester: Secondary | ICD-10-CM

## 2014-05-22 DIAGNOSIS — O360111 Maternal care for anti-D [Rh] antibodies, first trimester, fetus 1: Secondary | ICD-10-CM

## 2014-05-22 DIAGNOSIS — A749 Chlamydial infection, unspecified: Secondary | ICD-10-CM

## 2014-05-22 NOTE — Telephone Encounter (Signed)
Pt states that she has little red bumps on her face that itch. Pt thinks may have come from lemons she ate. Pt was advised to try benadryl and if that doesn't help to call us back. Pt has an appointment Wednesday at our office.

## 2014-05-24 ENCOUNTER — Encounter: Payer: Self-pay | Admitting: Women's Health

## 2014-05-24 ENCOUNTER — Ambulatory Visit (INDEPENDENT_AMBULATORY_CARE_PROVIDER_SITE_OTHER): Payer: Medicaid Other

## 2014-05-24 ENCOUNTER — Ambulatory Visit (INDEPENDENT_AMBULATORY_CARE_PROVIDER_SITE_OTHER): Payer: Self-pay | Admitting: Women's Health

## 2014-05-24 VITALS — BP 98/62 | Wt 159.0 lb

## 2014-05-24 DIAGNOSIS — Z331 Pregnant state, incidental: Secondary | ICD-10-CM

## 2014-05-24 DIAGNOSIS — Z3480 Encounter for supervision of other normal pregnancy, unspecified trimester: Secondary | ICD-10-CM

## 2014-05-24 DIAGNOSIS — Z36 Encounter for antenatal screening of mother: Secondary | ICD-10-CM

## 2014-05-24 DIAGNOSIS — Z1389 Encounter for screening for other disorder: Secondary | ICD-10-CM

## 2014-05-24 DIAGNOSIS — Z3481 Encounter for supervision of other normal pregnancy, first trimester: Secondary | ICD-10-CM

## 2014-05-24 LAB — POCT URINALYSIS DIPSTICK
Blood, UA: NEGATIVE
Glucose, UA: NEGATIVE
Ketones, UA: NEGATIVE
Leukocytes, UA: NEGATIVE
Nitrite, UA: NEGATIVE
Protein, UA: NEGATIVE

## 2014-05-24 MED ORDER — TRIAMCINOLONE ACETONIDE 0.025 % EX CREA: 1.0000 "application " | TOPICAL_CREAM | Freq: Every day | CUTANEOUS | Status: DC | PRN

## 2014-05-24 NOTE — Progress Notes (Signed)
U/S(12+3wks)-single IUP with +FCA noted FHR-161 bpm, CRL c/w dates, cx appears closed (4.3cm), bilateral adnexa appears WNL, NB present, NT-1.914mm, Rt lateral Gr 0 placenta

## 2014-05-24 NOTE — Patient Instructions (Addendum)
First Trimester of Pregnancy The first trimester of pregnancy is from week 1 until the end of week 12 (months 1 through 3). A week after a sperm fertilizes an egg, the egg will implant on the wall of the uterus. This embryo will begin to develop into a baby. Genes from you and your partner are forming the baby. The female genes determine whether the baby is a boy or a girl. At 6-8 weeks, the eyes and face are formed, and the heartbeat can be seen on ultrasound. At the end of 12 weeks, all the baby's organs are formed.  Now that you are pregnant, you will want to do everything you can to have a healthy baby. Two of the most important things are to get good prenatal care and to follow your health care provider's instructions. Prenatal care is all the medical care you receive before the baby's birth. This care will help prevent, find, and treat any problems during the pregnancy and childbirth. BODY CHANGES Your body goes through many changes during pregnancy. The changes vary from woman to woman.   You may gain or lose a couple of pounds at first.  You may feel sick to your stomach (nauseous) and throw up (vomit). If the vomiting is uncontrollable, call your health care provider.  You may tire easily.  You may develop headaches that can be relieved by medicines approved by your health care provider.  You may urinate more often. Painful urination may mean you have a bladder infection.  You may develop heartburn as a result of your pregnancy.  You may develop constipation because certain hormones are causing the muscles that push waste through your intestines to slow down.  You may develop hemorrhoids or swollen, bulging veins (varicose veins).  Your breasts may begin to grow larger and become tender. Your nipples may stick out more, and the tissue that surrounds them (areola) may become darker.  Your gums may bleed and may be sensitive to brushing and flossing.  Dark spots or blotches (chloasma,  mask of pregnancy) may develop on your face. This will likely fade after the baby is born.  Your menstrual periods will stop.  You may have a loss of appetite.  You may develop cravings for certain kinds of food.  You may have changes in your emotions from day to day, such as being excited to be pregnant or being concerned that something may go wrong with the pregnancy and baby.  You may have more vivid and strange dreams.  You may have changes in your hair. These can include thickening of your hair, rapid growth, and changes in texture. Some women also have hair loss during or after pregnancy, or hair that feels dry or thin. Your hair will most likely return to normal after your baby is born. WHAT TO EXPECT AT YOUR PRENATAL VISITS During a routine prenatal visit:  You will be weighed to make sure you and the baby are growing normally.  Your blood pressure will be taken.  Your abdomen will be measured to track your baby's growth.  The fetal heartbeat will be listened to starting around week 10 or 12 of your pregnancy.  Test results from any previous visits will be discussed. Your health care provider may ask you:  How you are feeling.  If you are feeling the baby move.  If you have had any abnormal symptoms, such as leaking fluid, bleeding, severe headaches, or abdominal cramping.  If you have any questions. Other tests   that may be performed during your first trimester include:  Blood tests to find your blood type and to check for the presence of any previous infections. They will also be used to check for low iron levels (anemia) and Rh antibodies. Later in the pregnancy, blood tests for diabetes will be done along with other tests if problems develop.  Urine tests to check for infections, diabetes, or protein in the urine.  An ultrasound to confirm the proper growth and development of the baby.  An amniocentesis to check for possible genetic problems.  Fetal screens for  spina bifida and Down syndrome.  You may need other tests to make sure you and the baby are doing well. HOME CARE INSTRUCTIONS  Medicines  Follow your health care provider's instructions regarding medicine use. Specific medicines may be either safe or unsafe to take during pregnancy.  Take your prenatal vitamins as directed.  If you develop constipation, try taking a stool softener if your health care provider approves. Diet  Eat regular, well-balanced meals. Choose a variety of foods, such as meat or vegetable-based protein, fish, milk and low-fat dairy products, vegetables, fruits, and whole grain breads and cereals. Your health care provider will help you determine the amount of weight gain that is right for you.  Avoid raw meat and uncooked cheese. These carry germs that can cause birth defects in the baby.  Eating four or five small meals rather than three large meals a day may help relieve nausea and vomiting. If you start to feel nauseous, eating a few soda crackers can be helpful. Drinking liquids between meals instead of during meals also seems to help nausea and vomiting.  If you develop constipation, eat more high-fiber foods, such as fresh vegetables or fruit and whole grains. Drink enough fluids to keep your urine clear or pale yellow. Activity and Exercise  Exercise only as directed by your health care provider. Exercising will help you:  Control your weight.  Stay in shape.  Be prepared for labor and delivery.  Experiencing pain or cramping in the lower abdomen or low back is a good sign that you should stop exercising. Check with your health care provider before continuing normal exercises.  Try to avoid standing for long periods of time. Move your legs often if you must stand in one place for a long time.  Avoid heavy lifting.  Wear low-heeled shoes, and practice good posture.  You may continue to have sex unless your health care provider directs you  otherwise. Relief of Pain or Discomfort  Wear a good support bra for breast tenderness.   Take warm sitz baths to soothe any pain or discomfort caused by hemorrhoids. Use hemorrhoid cream if your health care provider approves.   Rest with your legs elevated if you have leg cramps or low back pain.  If you develop varicose veins in your legs, wear support hose. Elevate your feet for 15 minutes, 3-4 times a day. Limit salt in your diet. Prenatal Care  Schedule your prenatal visits by the twelfth week of pregnancy. They are usually scheduled monthly at first, then more often in the last 2 months before delivery.  Write down your questions. Take them to your prenatal visits.  Keep all your prenatal visits as directed by your health care provider. Safety  Wear your seat belt at all times when driving.  Make a list of emergency phone numbers, including numbers for family, friends, the hospital, and police and fire departments. General Tips    Ask your health care provider for a referral to a local prenatal education class. Begin classes no later than at the beginning of month 6 of your pregnancy.  Ask for help if you have counseling or nutritional needs during pregnancy. Your health care provider can offer advice or refer you to specialists for help with various needs.  Do not use hot tubs, steam rooms, or saunas.  Do not douche or use tampons or scented sanitary pads.  Do not cross your legs for long periods of time.  Avoid cat litter boxes and soil used by cats. These carry germs that can cause birth defects in the baby and possibly loss of the fetus by miscarriage or stillbirth.  Avoid all smoking, herbs, alcohol, and medicines not prescribed by your health care provider. Chemicals in these affect the formation and growth of the baby.  Schedule a dentist appointment. At home, brush your teeth with a soft toothbrush and be gentle when you floss. SEEK MEDICAL CARE IF:   You have  dizziness.  You have mild pelvic cramps, pelvic pressure, or nagging pain in the abdominal area.  You have persistent nausea, vomiting, or diarrhea.  You have a bad smelling vaginal discharge.  You have pain with urination.  You notice increased swelling in your face, hands, legs, or ankles. SEEK IMMEDIATE MEDICAL CARE IF:   You have a fever.  You are leaking fluid from your vagina.  You have spotting or bleeding from your vagina.  You have severe abdominal cramping or pain.  You have rapid weight gain or loss.  You vomit blood or material that looks like coffee grounds.  You are exposed to [place] and have never had them.  You are exposed to fifth disease or chickenpox.  You develop a severe headache.  You have shortness of breath.  You have any kind of trauma, such as from a fall or a car accident. Document Released: 09/16/2001 Document Revised: 02/06/2014 Document Reviewed: 08/02/2013 [facility] Patient Information 2015 [place], [place]. This information is not intended to replace advice given to you by your health care provider. Make sure you discuss any questions you have with your health care provider.  Eczema Eczema, also called atopic dermatitis, is a skin disorder that causes inflammation of the skin. It causes a red rash and dry, scaly skin. The skin becomes very itchy. Eczema is generally worse during the cooler winter months and often improves with the warmth of summer. Eczema usually starts showing signs in infancy. Some children outgrow eczema, but it may last through adulthood.  CAUSES  The exact cause of eczema is not known, but it appears to run in families. People with eczema often have a family history of eczema, allergies, asthma, or hay fever. Eczema is not contagious. Flare-ups of the condition may be caused by:   Contact with something you are sensitive or allergic to.   Stress. SIGNS AND SYMPTOMS  Dry, scaly skin.   Red, itchy rash.    Itchiness. This may occur before the skin rash and may be very intense.  DIAGNOSIS  The diagnosis of eczema is usually made based on symptoms and medical history. TREATMENT  Eczema cannot be cured, but symptoms usually can be controlled with treatment and other strategies. A treatment plan might include:  Controlling the itching and scratching.   Use over-the-counter antihistamines as directed for itching. This is especially useful at night when the itching tends to be worse.   Use over-the-counter steroid creams as directed  for itching.   Avoid scratching. Scratching makes the rash and itching worse. It may also result in a skin infection (impetigo) due to a break in the skin caused by scratching.   Keeping the skin well moisturized with creams every day. This will seal in moisture and help prevent dryness. Lotions that contain alcohol and water should be avoided because they can dry the skin.   Limiting exposure to things that you are sensitive or allergic to (allergens).   Recognizing situations that cause stress.   Developing a plan to manage stress.  HOME CARE INSTRUCTIONS   Only take over-the-counter or prescription medicines as directed by your health care provider.   Do not use anything on the skin without checking with your health care provider.   Keep baths or showers short (5 minutes) in warm (not hot) water. Use mild cleansers for bathing. These should be unscented. You may add nonperfumed bath oil to the bath water. It is best to avoid soap and bubble bath.   Immediately after a bath or shower, when the skin is still damp, apply a moisturizing ointment to the entire body. This ointment should be a petroleum ointment. This will seal in moisture and help prevent dryness. The thicker the ointment, the better. These should be unscented.   Keep fingernails cut short. Children with eczema may need to wear soft gloves or mittens at night after applying an  ointment.   Dress in clothes made of cotton or cotton blends. Dress lightly, because heat increases itching.   A child with eczema should stay away from anyone with fever blisters or cold sores. The virus that causes fever blisters (herpes simplex) can cause a serious skin infection in children with eczema. SEEK MEDICAL CARE IF:   Your itching interferes with sleep.   Your rash gets worse or is not better within 1 week after starting treatment.   You see pus or soft yellow scabs in the rash area.   You have a fever.   You have a rash flare-up after contact with someone who has fever blisters.  Document Released: 09/19/2000 Document Revised: 07/13/2013 Document Reviewed: 04/25/2013 [facility] Patient Information 2015 [place], [place]. This information is not intended to replace advice given to you by your health care provider. Make sure you discuss any questions you have with your health care provider.

## 2014-05-24 NOTE — Progress Notes (Signed)
Low-risk OB appointment [id number] [id number] Estimated Date of Delivery: 12/03/14 LMP 02/26/2014  BP, weight, and urine reviewed.  Refer to obstetrical flow sheet for FH & FHR.  No fm yet. Denies cramping, lof, vb, or uti s/s. Itchy red bumps on face and back/buttocks, has h/o eczema.  Rx kenalog 0.025% cream daily prn Reviewed today's normal nt u/s, warning s/s to report. Plan:  Continue routine obstetrical care  F/U in 4wks for OB appointment, 2nd IT, and CT POC 1st it/nt today

## 2014-05-31 LAB — MATERNAL SCREEN, INTEGRATED #1

## 2014-06-21 ENCOUNTER — Ambulatory Visit (INDEPENDENT_AMBULATORY_CARE_PROVIDER_SITE_OTHER): Payer: Medicaid Other | Admitting: Women's Health

## 2014-06-21 ENCOUNTER — Encounter: Payer: Self-pay | Admitting: Women's Health

## 2014-06-21 VITALS — BP 112/60 | Wt 159.0 lb

## 2014-06-21 DIAGNOSIS — O98811 Other maternal infectious and parasitic diseases complicating pregnancy, first trimester: Secondary | ICD-10-CM

## 2014-06-21 DIAGNOSIS — Z3482 Encounter for supervision of other normal pregnancy, second trimester: Secondary | ICD-10-CM

## 2014-06-21 DIAGNOSIS — Z3480 Encounter for supervision of other normal pregnancy, unspecified trimester: Secondary | ICD-10-CM

## 2014-06-21 DIAGNOSIS — A749 Chlamydial infection, unspecified: Secondary | ICD-10-CM

## 2014-06-21 DIAGNOSIS — F32A Depression, unspecified: Secondary | ICD-10-CM | POA: Insufficient documentation

## 2014-06-21 DIAGNOSIS — Z36 Encounter for antenatal screening of mother: Secondary | ICD-10-CM

## 2014-06-21 DIAGNOSIS — F329 Major depressive disorder, single episode, unspecified: Secondary | ICD-10-CM

## 2014-06-21 DIAGNOSIS — Z1389 Encounter for screening for other disorder: Secondary | ICD-10-CM

## 2014-06-21 DIAGNOSIS — Z331 Pregnant state, incidental: Secondary | ICD-10-CM

## 2014-06-21 DIAGNOSIS — O99340 Other mental disorders complicating pregnancy, unspecified trimester: Secondary | ICD-10-CM

## 2014-06-21 LAB — POCT URINALYSIS DIPSTICK
Blood, UA: NEGATIVE
Glucose, UA: NEGATIVE
Ketones, UA: NEGATIVE
Leukocytes, UA: NEGATIVE
Nitrite, UA: NEGATIVE

## 2014-06-21 MED ORDER — ESCITALOPRAM OXALATE 10 MG PO TABS: 10.0000 mg | ORAL_TABLET | Freq: Every day | ORAL | Status: DC

## 2014-06-21 NOTE — Progress Notes (Signed)
Low-risk OB appointment [id number] [redacted]w[redacted]d Estimated Date of Delivery: 12/03/14 BP 112/60  Wt 159 lb (72.122 kg)  LMP 02/26/2014  BP, weight, and urine reviewed.  Refer to obstetrical flow sheet for FH & FHR.  Feeling some fm.  Denies regular uc's, lof, vb, or uti s/s. 'Down' all the time, doesn't feel like going out of house or doing anything she used to find joy in. Doesn't enjoy spending time w/ other child. Sleeps a lot, doesn't get dressed. Eats well. Denies SI/HI, did have some PPD after last baby- otherwise no hx. Mom w/ pt, states she is not herself. Discussed options, wants to try SSRI/counseling, knows not to expect immediate results w/ SSRI. To seek care if SI/HI.  Reviewed warning s/s to report. Plan:  Continue routine obstetrical care, Rx lexapro  daily, [facility] referral sent F/U in 4wks for OB appointment and anatomy u/s

## 2014-06-21 NOTE — Patient Instructions (Signed)
Second Trimester of Pregnancy The second trimester is from week 13 through week 28, months 4 through 6. The second trimester is often a time when you feel your best. Your body has also adjusted to being pregnant, and you begin to feel better physically. Usually, morning sickness has lessened or quit completely, you may have more energy, and you may have an increase in appetite. The second trimester is also a time when the fetus is growing rapidly. At the end of the sixth month, the fetus is about 9 inches long and weighs about 1 pounds. You will likely begin to feel the baby move (quickening) between 18 and 20 weeks of the pregnancy. BODY CHANGES Your body goes through many changes during pregnancy. The changes vary from woman to woman.   Your weight will continue to increase. You will notice your lower abdomen bulging out.  You may begin to get stretch marks on your hips, abdomen, and breasts.  You may develop headaches that can be relieved by medicines approved by your health care provider.  You may urinate more often because the fetus is pressing on your bladder.  You may develop or continue to have heartburn as a result of your pregnancy.  You may develop constipation because certain hormones are causing the muscles that push waste through your intestines to slow down.  You may develop hemorrhoids or swollen, bulging veins (varicose veins).  You may have back pain because of the weight gain and pregnancy hormones relaxing your joints between the bones in your pelvis and as a result of a shift in weight and the muscles that support your balance.  Your breasts will continue to grow and be tender.  Your gums may bleed and may be sensitive to brushing and flossing.  Dark spots or blotches (chloasma, mask of pregnancy) may develop on your face. This will likely fade after the baby is born.  A dark line from your belly button to the pubic area (linea nigra) may appear. This will likely fade  after the baby is born.  You may have changes in your hair. These can include thickening of your hair, rapid growth, and changes in texture. Some women also have hair loss during or after pregnancy, or hair that feels dry or thin. Your hair will most likely return to normal after your baby is born. WHAT TO EXPECT AT YOUR PRENATAL VISITS During a routine prenatal visit:  You will be weighed to make sure you and the fetus are growing normally.  Your blood pressure will be taken.  Your abdomen will be measured to track your baby's growth.  The fetal heartbeat will be listened to.  Any test results from the previous visit will be discussed. Your health care provider may ask you:  How you are feeling.  If you are feeling the baby move.  If you have had any abnormal symptoms, such as leaking fluid, bleeding, severe headaches, or abdominal cramping.  If you have any questions. Other tests that may be performed during your second trimester include:  Blood tests that check for:  Low iron levels (anemia).  Gestational diabetes (between 24 and 28 weeks).  Rh antibodies.  Urine tests to check for infections, diabetes, or protein in the urine.  An ultrasound to confirm the proper growth and development of the baby.  An amniocentesis to check for possible genetic problems.  Fetal screens for spina bifida and Down syndrome. HOME CARE INSTRUCTIONS   Avoid all smoking, herbs, alcohol, and unprescribed   drugs. These chemicals affect the formation and growth of the baby.  Follow your health care provider's instructions regarding medicine use. There are medicines that are either safe or unsafe to take during pregnancy.  Exercise only as directed by your health care provider. Experiencing uterine cramps is a good sign to stop exercising.  Continue to eat regular, healthy meals.  Wear a good support bra for breast tenderness.  Do not use hot tubs, steam rooms, or saunas.  Wear your  seat belt at all times when driving.  Avoid raw meat, uncooked cheese, cat litter boxes, and soil used by cats. These carry germs that can cause birth defects in the baby.  Take your prenatal vitamins.  Try taking a stool softener (if your health care provider approves) if you develop constipation. Eat more high-fiber foods, such as fresh vegetables or fruit and whole grains. Drink plenty of fluids to keep your urine clear or pale yellow.  Take warm sitz baths to soothe any pain or discomfort caused by hemorrhoids. Use hemorrhoid cream if your health care provider approves.  If you develop varicose veins, wear support hose. Elevate your feet for 15 minutes, 3-4 times a day. Limit salt in your diet.  Avoid heavy lifting, wear low heel shoes, and practice good posture.  Rest with your legs elevated if you have leg cramps or low back pain.  Visit your dentist if you have not gone yet during your pregnancy. Use a soft toothbrush to brush your teeth and be gentle when you floss.  A sexual relationship may be continued unless your health care provider directs you otherwise.  Continue to go to all your prenatal visits as directed by your health care provider. SEEK MEDICAL CARE IF:   You have dizziness.  You have mild pelvic cramps, pelvic pressure, or nagging pain in the abdominal area.  You have persistent nausea, vomiting, or diarrhea.  You have a bad smelling vaginal discharge.  You have pain with urination. SEEK IMMEDIATE MEDICAL CARE IF:   You have a fever.  You are leaking fluid from your vagina.  You have spotting or bleeding from your vagina.  You have severe abdominal cramping or pain.  You have rapid weight gain or loss.  You have shortness of breath with chest pain.  You notice sudden or extreme swelling of your face, hands, ankles, feet, or legs.  You have not felt your baby move in over an hour.  You have severe headaches that do not go away with  medicine.  You have vision changes. Document Released: 09/16/2001 Document Revised: 09/27/2013 Document Reviewed: 11/23/2012 [facility] Patient Information 2015 [place], [place]. This information is not intended to replace advice given to you by your health care provider. Make sure you discuss any questions you have with your health care provider.  Depression Depression refers to feeling sad, low, down in the dumps, blue, gloomy, or empty. In general, there are two kinds of depression: 1. Normal sadness or normal grief. This kind of depression is one that we all feel from time to time after upsetting life experiences, such as the loss of a job or the ending of a relationship. This kind of depression is considered normal, is short lived, and resolves within a few days to 2 weeks. Depression experienced after the loss of a loved one (bereavement) often lasts longer than 2 weeks but normally gets better with time. 2. Clinical depression. This kind of depression lasts longer than normal sadness or normal grief or  interferes with your ability to function at home, at work, and in school. It also interferes with your personal relationships. It affects almost every aspect of your life. Clinical depression is an illness. Symptoms of depression can also be caused by conditions other than those mentioned above, such as:  Physical illness. Some physical illnesses, including underactive thyroid gland (hypothyroidism), severe anemia, specific types of cancer, diabetes, uncontrolled seizures, heart and lung problems, strokes, and chronic pain are commonly associated with symptoms of depression.  Side effects of some prescription medicine. In some people, certain types of medicine can cause symptoms of depression.  Substance abuse. Abuse of alcohol and illicit drugs can cause symptoms of depression. SYMPTOMS Symptoms of normal sadness and normal grief include the following:  Feeling sad or crying for short periods  of time.  Not caring about anything (apathy).  Difficulty sleeping or sleeping too much.  No longer able to enjoy the things you used to enjoy.  Desire to be by oneself all the time (social isolation).  Lack of energy or motivation.  Difficulty concentrating or remembering.  Change in appetite or weight.  Restlessness or agitation. Symptoms of clinical depression include the same symptoms of normal sadness or normal grief and also the following symptoms:  Feeling sad or crying all the time.  Feelings of guilt or worthlessness.  Feelings of hopelessness or helplessness.  Thoughts of suicide or the desire to harm yourself (suicidal ideation).  Loss of touch with reality (psychotic symptoms). Seeing or hearing things that are not real (hallucinations) or having false beliefs about your life or the people around you (delusions and paranoia). DIAGNOSIS  The diagnosis of clinical depression is usually based on how bad the symptoms are and how long they have lasted. Your health care provider will also ask you questions about your medical history and substance use to find out if physical illness, use of prescription medicine, or substance abuse is causing your depression. Your health care provider may also order blood tests. TREATMENT  Often, normal sadness and normal grief do not require treatment. However, sometimes antidepressant medicine is given for bereavement to ease the depressive symptoms until they resolve. The treatment for clinical depression depends on how bad the symptoms are but often includes antidepressant medicine, counseling with a mental health professional, or both. Your health care provider will help to determine what treatment is best for you. Depression caused by physical illness usually goes away with appropriate medical treatment of the illness. If prescription medicine is causing depression, talk with your health care provider about stopping the medicine,  decreasing the dose, or changing to another medicine. Depression caused by the abuse of alcohol or illicit drugs goes away when you stop using these substances. Some adults need professional help in order to stop drinking or using drugs. SEEK IMMEDIATE MEDICAL CARE IF:  You have thoughts about hurting yourself or others.  You lose touch with reality (have psychotic symptoms).  You are taking medicine for depression and have a serious side effect. FOR MORE INFORMATION  National [employer] on Mental Illness: www.nami.[employer] of Mental Health: http://www.maynard.net/ Document Released: 09/19/2000 Document Revised: 02/06/2014 Document Reviewed: 12/22/2011 [facility] Patient Information 2015 [place], [place]. This information is not intended to replace advice given to you by your health care provider. Make sure you discuss any questions you have with your health care provider.

## 2014-06-22 LAB — GC/CHLAMYDIA PROBE AMP
CT Probe RNA: NEGATIVE
GC Probe RNA: NEGATIVE

## 2014-06-24 LAB — MATERNAL SCREEN, INTEGRATED #2
AFP MOM MAT SCREEN: 1.78
AFP, Serum: 72.7 ng/mL
Calculated Gestational Age: 17
Crown Rump Length: 69.5 mm
ESTRIOL FREE MAT SCREEN: 1.2 ng/mL
Estriol Mom: 1.17
INHIBIN A DIMERIC MAT SCREEN: 267 pg/mL
Inhibin A MoM: 1.69
MSS Down Syndrome: <1:5000 {titer}
NT MoM: 0.91
NUCHAL TRANSLUCENCY MAT SCREEN 2: 1.4 mm
Number of fetuses: 1
PAPP-A MAT SCREEN: 2388 ng/mL
PAPP-A MoM: 2.2
Rish for ONTD: 1:1300 {titer}
hCG MoM: 1.55
hCG, Serum: 51.7 IU/mL

## 2014-06-26 ENCOUNTER — Encounter: Payer: Self-pay | Admitting: Women's Health

## 2014-07-14 ENCOUNTER — Ambulatory Visit (INDEPENDENT_AMBULATORY_CARE_PROVIDER_SITE_OTHER): Payer: Medicaid Other | Admitting: Obstetrics & Gynecology

## 2014-07-14 ENCOUNTER — Other Ambulatory Visit: Payer: Self-pay | Admitting: Women's Health

## 2014-07-14 ENCOUNTER — Ambulatory Visit (INDEPENDENT_AMBULATORY_CARE_PROVIDER_SITE_OTHER): Payer: Medicaid Other

## 2014-07-14 ENCOUNTER — Encounter: Payer: Self-pay | Admitting: Obstetrics & Gynecology

## 2014-07-14 VITALS — BP 110/70 | Wt 162.0 lb

## 2014-07-14 DIAGNOSIS — Z3482 Encounter for supervision of other normal pregnancy, second trimester: Secondary | ICD-10-CM

## 2014-07-14 DIAGNOSIS — O09292 Supervision of pregnancy with other poor reproductive or obstetric history, second trimester: Secondary | ICD-10-CM

## 2014-07-14 DIAGNOSIS — Z1389 Encounter for screening for other disorder: Secondary | ICD-10-CM

## 2014-07-14 LAB — POCT URINALYSIS DIPSTICK
Glucose, UA: NEGATIVE
KETONES UA: NEGATIVE
Leukocytes, UA: NEGATIVE
Nitrite, UA: NEGATIVE
PROTEIN UA: NEGATIVE
RBC UA: NEGATIVE

## 2014-07-14 NOTE — Addendum Note (Signed)
Addended by: Colen DarlingYOUNG, Ladene Allocca S on: 07/14/2014 01:26 PM   Modules accepted: Orders

## 2014-07-14 NOTE — Progress Notes (Signed)
U/S(19+5wks)-active fetus,meas c/w dates, fluid WNL, Rt lateral/ anterior Gr 0 placenta, FHR-145 bpm, cx appears closed (3.2cm), bilateral adnexa appears WNL, no major abnl noted, female fetus

## 2014-07-14 NOTE — Progress Notes (Signed)
[id number] Estimated Date of Delivery: 12/03/14  Blood pressure 110/70, weight 162 lb (73.483 kg), last menstrual period 02/26/2014.   BP weight and urine results all reviewed and noted.  Please refer to the obstetrical flow sheet for the fundal height and fetal heart rate documentation:  Patient reports good fetal movement, denies any bleeding and no rupture of membranes symptoms or regular contractions. Patient is without complaints. All questions were answered.  Plan:  Continued routine obstetrical care,   Follow up in 4 weeks for OB appointment, routine

## 2014-07-15 ENCOUNTER — Encounter: Payer: Self-pay | Admitting: Women's Health

## 2014-08-07 ENCOUNTER — Encounter: Payer: Self-pay | Admitting: Obstetrics & Gynecology

## 2014-08-08 ENCOUNTER — Ambulatory Visit (INDEPENDENT_AMBULATORY_CARE_PROVIDER_SITE_OTHER): Payer: Medicaid Other | Admitting: Women's Health

## 2014-08-08 ENCOUNTER — Encounter: Payer: Self-pay | Admitting: Women's Health

## 2014-08-08 VITALS — BP 104/60 | Wt 171.0 lb

## 2014-08-08 DIAGNOSIS — Z1389 Encounter for screening for other disorder: Secondary | ICD-10-CM

## 2014-08-08 DIAGNOSIS — A5901 Trichomonal vulvovaginitis: Secondary | ICD-10-CM | POA: Insufficient documentation

## 2014-08-08 DIAGNOSIS — F32A Depression, unspecified: Secondary | ICD-10-CM

## 2014-08-08 DIAGNOSIS — N898 Other specified noninflammatory disorders of vagina: Secondary | ICD-10-CM

## 2014-08-08 DIAGNOSIS — A599 Trichomoniasis, unspecified: Secondary | ICD-10-CM

## 2014-08-08 DIAGNOSIS — O99340 Other mental disorders complicating pregnancy, unspecified trimester: Secondary | ICD-10-CM

## 2014-08-08 DIAGNOSIS — F329 Major depressive disorder, single episode, unspecified: Secondary | ICD-10-CM

## 2014-08-08 DIAGNOSIS — R300 Dysuria: Secondary | ICD-10-CM

## 2014-08-08 DIAGNOSIS — L298 Other pruritus: Secondary | ICD-10-CM

## 2014-08-08 DIAGNOSIS — Z3492 Encounter for supervision of normal pregnancy, unspecified, second trimester: Secondary | ICD-10-CM

## 2014-08-08 DIAGNOSIS — O23592 Infection of other part of genital tract in pregnancy, second trimester: Secondary | ICD-10-CM

## 2014-08-08 DIAGNOSIS — O26892 Other specified pregnancy related conditions, second trimester: Secondary | ICD-10-CM

## 2014-08-08 DIAGNOSIS — Z331 Pregnant state, incidental: Secondary | ICD-10-CM

## 2014-08-08 HISTORY — DX: Trichomoniasis, unspecified: A59.9

## 2014-08-08 HISTORY — DX: Infection of other part of genital tract in pregnancy, second trimester: O23.592

## 2014-08-08 HISTORY — DX: Trichomonal vulvovaginitis: A59.01

## 2014-08-08 LAB — POCT URINALYSIS DIPSTICK
Glucose, UA: NEGATIVE
Ketones, UA: NEGATIVE
Nitrite, UA: NEGATIVE

## 2014-08-08 LAB — POCT WET PREP (WET MOUNT): Clue Cells Wet Prep Whiff POC: NEGATIVE

## 2014-08-08 MED ORDER — METRONIDAZOLE 500 MG PO TABS: 2000.0000 mg | ORAL_TABLET | Freq: Once | ORAL | Status: DC

## 2014-08-08 NOTE — Patient Instructions (Signed)
You will have your sugar test next visit.  Please do not eat or drink anything after midnight the night before you come, not even water.  You will be here for at least two hours.     Call the office ([phone]) or go to [facility] if:  You begin to have strong, frequent contractions  Your water breaks.  Sometimes it is a big gush of fluid, sometimes it is just a trickle that keeps getting your panties wet or running down your legs  You have vaginal bleeding.  It is normal to have a small amount of spotting if your cervix was checked.   You don't feel your baby moving like normal.  If you don't, get you something to eat and drink and lay down and focus on feeling your baby move.  If your baby is still not moving like normal, you should call the office or go to [facility].    Second Trimester of Pregnancy The second trimester is from week 13 through week 28, months 4 through 6. The second trimester is often a time when you feel your best. Your body has also adjusted to being pregnant, and you begin to feel better physically. Usually, morning sickness has lessened or quit completely, you may have more energy, and you may have an increase in appetite. The second trimester is also a time when the fetus is growing rapidly. At the end of the sixth month, the fetus is about 9 inches long and weighs about 1 pounds. You will likely begin to feel the baby move (quickening) between 18 and 20 weeks of the pregnancy. BODY CHANGES Your body goes through many changes during pregnancy. The changes vary from woman to woman.  5. Your weight will continue to increase. You will notice your lower abdomen bulging out. 6. You may begin to get stretch marks on your hips, abdomen, and breasts. 7. You may develop headaches that can be relieved by medicines approved by your health care provider. 8. You may urinate more often because the fetus is pressing on your bladder. 9. You may develop or continue to have  heartburn as a result of your pregnancy. 10. You may develop constipation because certain hormones are causing the muscles that push waste through your intestines to slow down. 11. You may develop hemorrhoids or swollen, bulging veins (varicose veins). 12. You may have back pain because of the weight gain and pregnancy hormones relaxing your joints between the bones in your pelvis and as a result of a shift in weight and the muscles that support your balance. 13. Your breasts will continue to grow and be tender. 14. Your gums may bleed and may be sensitive to brushing and flossing. 15. Dark spots or blotches (chloasma, mask of pregnancy) may develop on your face. This will likely fade after the baby is born. 16. A dark line from your belly button to the pubic area (linea nigra) may appear. This will likely fade after the baby is born. 17. You may have changes in your hair. These can include thickening of your hair, rapid growth, and changes in texture. Some women also have hair loss during or after pregnancy, or hair that feels dry or thin. Your hair will most likely return to normal after your baby is born. WHAT TO EXPECT AT YOUR PRENATAL VISITS During a routine prenatal visit:  You will be weighed to make sure you and the fetus are growing normally.  Your blood pressure will be taken.    taken.  Your abdomen will be measured to track your baby's growth.  The fetal heartbeat will be listened to.  Any test results from the previous visit will be discussed. Your health care provider may ask you:  How you are feeling.  If you are feeling the baby move.  If you have had any abnormal symptoms, such as leaking fluid, bleeding, severe headaches, or abdominal cramping.  If you have any questions. Other tests that may be performed during your second trimester include:  Blood tests that check for:  Low iron levels (anemia).  Gestational diabetes (between 24 and 28 weeks).  Rh antibodies.  Urine  tests to check for infections, diabetes, or protein in the urine.  An ultrasound to confirm the proper growth and development of the baby.  An amniocentesis to check for possible genetic problems.  Fetal screens for spina bifida and Down syndrome. HOME CARE INSTRUCTIONS   Avoid all smoking, herbs, alcohol, and unprescribed drugs. These chemicals affect the formation and growth of the baby.  Follow your health care provider's instructions regarding medicine use. There are medicines that are either safe or unsafe to take during pregnancy.  Exercise only as directed by your health care provider. Experiencing uterine cramps is a good sign to stop exercising.  Continue to eat regular, healthy meals.  Wear a good support bra for breast tenderness.  Do not use hot tubs, steam rooms, or saunas.  Wear your seat belt at all times when driving.  Avoid raw meat, uncooked cheese, cat litter boxes, and soil used by cats. These carry germs that can cause birth defects in the baby.  Take your prenatal vitamins.  Try taking a stool softener (if your health care provider approves) if you develop constipation. Eat more high-fiber foods, such as fresh vegetables or fruit and whole grains. Drink plenty of fluids to keep your urine clear or pale yellow.  Take warm sitz baths to soothe any pain or discomfort caused by hemorrhoids. Use hemorrhoid cream if your health care provider approves.  If you develop varicose veins, wear support hose. Elevate your feet for 15 minutes, 3-4 times a day. Limit salt in your diet.  Avoid heavy lifting, wear low heel shoes, and practice good posture.  Rest with your legs elevated if you have leg cramps or low back pain.  Visit your dentist if you have not gone yet during your pregnancy. Use a soft toothbrush to brush your teeth and be gentle when you floss.  A sexual relationship may be continued unless your health care provider directs you otherwise.  Continue to  go to all your prenatal visits as directed by your health care provider. SEEK MEDICAL CARE IF:   You have dizziness.  You have mild pelvic cramps, pelvic pressure, or nagging pain in the abdominal area.  You have persistent nausea, vomiting, or diarrhea.  You have a bad smelling vaginal discharge.  You have pain with urination. SEEK IMMEDIATE MEDICAL CARE IF:   You have a fever.  You are leaking fluid from your vagina.  You have spotting or bleeding from your vagina.  You have severe abdominal cramping or pain.  You have rapid weight gain or loss.  You have shortness of breath with chest pain.  You notice sudden or extreme swelling of your face, hands, ankles, feet, or legs.  You have not felt your baby move in over an hour.  You have severe headaches that do not go away with medicine.  You have  Document Released: 09/16/2001 Document Revised: 09/27/2013 Document Reviewed: 11/23/2012 [employer] Patient Information 2015 [employer]. This information is not intended to replace advice given to you by your health care provider. Make sure you discuss any questions you have with your health care provider.  

## 2014-08-08 NOTE — Progress Notes (Signed)
Low-risk OB appointment [id number] Estimated Date of Delivery: 12/03/14 BP 104/60 mmHg  Wt 171 lb (77.565 kg)  LMP 02/26/2014  BP, weight, and urine reviewed.  Refer to obstetrical flow sheet for FH & FHR.  Reports good fm.  Denies regular uc's, lof, vb. Dysuria that stopped few days ago. Vaginal itching x few weeks, no abnormal d/c or odor.  Never started lexapro or [facility] for depression, started talking to friends who have helped, and she is feeling much better- mother who is present agrees.  Has 'bump' Rt groin area that sometimes enlarges and is tender- was told last visit was lymph node. Unable to palpate today.  Spec exam: cx visually closed, small amount thin white slightly malodorous d/c Wet prep: many wbc's, Pos trichomonas->rx flagyl 2gm po x 1, will also send urine for gc/ct and culture.  Reviewed ptl s/s, fkc. Plan:  Continue routine obstetrical care  F/U in 4wks for OB appointment and pn2

## 2014-08-09 LAB — URINE CULTURE

## 2014-08-09 LAB — GC/CHLAMYDIA PROBE AMP
CT Probe RNA: NEGATIVE
GC PROBE AMP APTIMA: NEGATIVE

## 2014-09-05 ENCOUNTER — Other Ambulatory Visit: Payer: Medicaid Other

## 2014-09-05 ENCOUNTER — Encounter: Payer: Self-pay | Admitting: Women's Health

## 2014-09-05 ENCOUNTER — Ambulatory Visit (INDEPENDENT_AMBULATORY_CARE_PROVIDER_SITE_OTHER): Payer: Medicaid Other | Admitting: Women's Health

## 2014-09-05 VITALS — BP 118/76 | Wt 180.0 lb

## 2014-09-05 DIAGNOSIS — Z3482 Encounter for supervision of other normal pregnancy, second trimester: Secondary | ICD-10-CM

## 2014-09-05 DIAGNOSIS — Z113 Encounter for screening for infections with a predominantly sexual mode of transmission: Secondary | ICD-10-CM

## 2014-09-05 DIAGNOSIS — Z0184 Encounter for antibody response examination: Secondary | ICD-10-CM

## 2014-09-05 DIAGNOSIS — A5901 Trichomonal vulvovaginitis: Secondary | ICD-10-CM

## 2014-09-05 DIAGNOSIS — Z1389 Encounter for screening for other disorder: Secondary | ICD-10-CM

## 2014-09-05 DIAGNOSIS — Z131 Encounter for screening for diabetes mellitus: Secondary | ICD-10-CM

## 2014-09-05 DIAGNOSIS — Z331 Pregnant state, incidental: Secondary | ICD-10-CM

## 2014-09-05 DIAGNOSIS — O23592 Infection of other part of genital tract in pregnancy, second trimester: Secondary | ICD-10-CM

## 2014-09-05 DIAGNOSIS — Z114 Encounter for screening for human immunodeficiency virus [HIV]: Secondary | ICD-10-CM

## 2014-09-05 LAB — POCT URINALYSIS DIPSTICK
Glucose, UA: NEGATIVE
KETONES UA: NEGATIVE
Leukocytes, UA: NEGATIVE
Nitrite, UA: NEGATIVE
PROTEIN UA: NEGATIVE
RBC UA: NEGATIVE

## 2014-09-05 LAB — CBC
HCT: 30.4 % — ABNORMAL LOW (ref 36.0–46.0)
Hemoglobin: 10.2 g/dL — ABNORMAL LOW (ref 12.0–15.0)
MCH: 31.1 pg (ref 26.0–34.0)
MCHC: 33.6 g/dL (ref 30.0–36.0)
MCV: 92.7 fL (ref 78.0–100.0)
MPV: 11.7 fL (ref 9.4–12.4)
PLATELETS: 200 10*3/uL (ref 150–400)
RBC: 3.28 MIL/uL — AB (ref 3.87–5.11)
RDW: 13.6 % (ref 11.5–15.5)
WBC: 6.3 10*3/uL (ref 4.0–10.5)

## 2014-09-05 LAB — POCT WET PREP (WET MOUNT): CLUE CELLS WET PREP WHIFF POC: NEGATIVE

## 2014-09-05 NOTE — Progress Notes (Signed)
Low-risk OB appointment [id number] 9022w2d Estimated Date of Delivery: 12/03/14 BP 118/76 mmHg  Wt 180 lb (81.647 kg)  LMP 02/26/2014  BP, weight, and urine reviewed.  Refer to obstetrical flow sheet for FH & FHR.  Reports good fm.  Denies regular uc's, lof, vb, or uti s/s.  Some LBP- discussed relief measures.  Trich POC: spec exam- mod amt white nonodorous d/c, cx visually closed, wet prep: some yeast hyphae, neg trich Pt w/o sx of yeast, so will not tx at this time Reviewed ptl s/s, fkc. Recommended Tdap at HD/PCP per CDC guidelines.  Plan:  Continue routine obstetrical care  F/U in 4wks for OB appointment  PN2 today

## 2014-09-05 NOTE — Patient Instructions (Signed)
For your lower back pain you may:  Purchase a pregnancy belt from [employer], [employer], [employer], etc and wear it while you are up and about  Take warm baths  Use a heating pad to your lower back for no longer than 20 minutes at a time, and do not place near abdomen  Take tylenol as needed. Please follow directions on the bottle   Call the office [phone]) or go to [facility] if:  You begin to have strong, frequent contractions  Your water breaks.  Sometimes it is a big gush of fluid, sometimes it is just a trickle that keeps getting your panties wet or running down your legs  You have vaginal bleeding.  It is normal to have a small amount of spotting if your cervix was checked.   You don't feel your baby moving like normal.  If you don't, get you something to eat and drink and lay down and focus on feeling your baby move.  You should feel at least 10 movements in 2 hours.  If you don't, you should call the office or go to [facility].    Tdap Vaccine  It is recommended that you get the Tdap vaccine during the third trimester of EACH pregnancy to help protect your baby from getting pertussis (whooping cough)  27-36 weeks is the BEST time to do this so that you can pass the protection on to your baby. During pregnancy is better than after pregnancy, but if you are unable to get it during pregnancy it will be offered at the hospital.   You can get this vaccine at the health department or your family doctor  Everyone who will be around your baby should also be up-to-date on their vaccines. Adults (who are not pregnant) only need 1 dose of Tdap during adulthood.    Third Trimester of Pregnancy The third trimester is from week 29 through week 42, months 7 through 9. The third trimester is a time when the fetus is growing rapidly. At the end of the ninth month, the fetus is about 20 inches in length and weighs 6-10 pounds.  BODY CHANGES Your body goes through many  changes during pregnancy. The changes vary from woman to woman.   Your weight will continue to increase. You can expect to gain 25-35 pounds (11-16 kg) by the end of the pregnancy.  You may begin to get stretch marks on your hips, abdomen, and breasts.  You may urinate more often because the fetus is moving lower into your pelvis and pressing on your bladder.  You may develop or continue to have heartburn as a result of your pregnancy.  You may develop constipation because certain hormones are causing the muscles that push waste through your intestines to slow down.  You may develop hemorrhoids or swollen, bulging veins (varicose veins).  You may have pelvic pain because of the weight gain and pregnancy hormones relaxing your joints between the bones in your pelvis. Backaches may result from overexertion of the muscles supporting your posture.  You may have changes in your hair. These can include thickening of your hair, rapid growth, and changes in texture. Some women also have hair loss during or after pregnancy, or hair that feels dry or thin. Your hair will most likely return to normal after your baby is born.  Your breasts will continue to grow and be tender. A yellow discharge may leak from your breasts called colostrum.  Your belly button may  out.  You may feel short of breath because of your expanding uterus.  You may notice the fetus "dropping," or moving lower in your abdomen.  You may have a bloody mucus discharge. This usually occurs a few days to a week before labor begins.  Your cervix becomes thin and soft (effaced) near your due date. WHAT TO EXPECT AT YOUR PRENATAL EXAMS  You will have prenatal exams every 2 weeks until week 36. Then, you will have weekly prenatal exams. During a routine prenatal visit:  You will be weighed to make sure you and the fetus are growing normally.  Your blood pressure is taken.  Your abdomen will be measured to track your baby's  growth.  The fetal heartbeat will be listened to.  Any test results from the previous visit will be discussed.  You may have a cervical check near your due date to see if you have effaced. At around 36 weeks, your caregiver will check your cervix. At the same time, your caregiver will also perform a test on the secretions of the vaginal tissue. This test is to determine if a type of bacteria, Group B streptococcus, is present. Your caregiver will explain this further. Your caregiver may ask you:  What your birth plan is.  How you are feeling.  If you are feeling the baby move.  If you have had any abnormal symptoms, such as leaking fluid, bleeding, severe headaches, or abdominal cramping.  If you have any questions. Other tests or screenings that may be performed during your third trimester include:  Blood tests that check for low iron levels (anemia).  Fetal testing to check the health, activity level, and growth of the fetus. Testing is done if you have certain medical conditions or if there are problems during the pregnancy. FALSE LABOR You may feel small, irregular contractions that eventually go away. These are called Braxton Hicks contractions, or false labor. Contractions may last for hours, days, or even weeks before true labor sets in. If contractions come at regular intervals, intensify, or become painful, it is best to be seen by your caregiver.  SIGNS OF LABOR   Menstrual-like cramps.  Contractions that are 5 minutes apart or less.  Contractions that start on the top of the uterus and spread down to the lower abdomen and back.  A sense of increased pelvic pressure or back pain.  A watery or bloody mucus discharge that comes from the vagina. If you have any of these signs before the 37th week of pregnancy, call your caregiver right away. You need to go to the hospital to get checked immediately. HOME CARE INSTRUCTIONS   Avoid all smoking, herbs, alcohol, and  unprescribed drugs. These chemicals affect the formation and growth of the baby.  Follow your caregiver's instructions regarding medicine use. There are medicines that are either safe or unsafe to take during pregnancy.  Exercise only as directed by your caregiver. Experiencing uterine cramps is a good sign to stop exercising.  Continue to eat regular, healthy meals.  Wear a good support bra for breast tenderness.  Do not use hot tubs, steam rooms, or saunas.  Wear your seat belt at all times when driving.  Avoid raw meat, uncooked cheese, cat litter boxes, and soil used by cats. These carry germs that can cause birth defects in the baby.  Take your prenatal vitamins.  Try taking a stool softener (if your caregiver approves) if you develop constipation. Eat more high-fiber foods, such as fresh   fresh vegetables or fruit and whole grains. Drink plenty of fluids to keep your urine clear or pale yellow.  Take warm sitz baths to soothe any pain or discomfort caused by hemorrhoids. Use hemorrhoid cream if your caregiver approves.  If you develop varicose veins, wear support hose. Elevate your feet for 15 minutes, 3-4 times a day. Limit salt in your diet.  Avoid heavy lifting, wear low heal shoes, and practice good posture.  Rest a lot with your legs elevated if you have leg cramps or low back pain.  Visit your dentist if you have not gone during your pregnancy. Use a soft toothbrush to brush your teeth and be gentle when you floss.  A sexual relationship may be continued unless your caregiver directs you otherwise.  Do not travel far distances unless it is absolutely necessary and only with the approval of your caregiver.  Take prenatal classes to understand, practice, and ask questions about the labor and delivery.  Make a trial run to the hospital.  Pack your hospital bag.  Prepare the baby's nursery.  Continue to go to all your prenatal visits as directed by your caregiver. SEEK  MEDICAL CARE IF:  You are unsure if you are in labor or if your water has broken.  You have dizziness.  You have mild pelvic cramps, pelvic pressure, or nagging pain in your abdominal area.  You have persistent nausea, vomiting, or diarrhea.  You have a bad smelling vaginal discharge.  You have pain with urination. SEEK IMMEDIATE MEDICAL CARE IF:   You have a fever.  You are leaking fluid from your vagina.  You have spotting or bleeding from your vagina.  You have severe abdominal cramping or pain.  You have rapid weight loss or gain.  You have shortness of breath with chest pain.  You notice sudden or extreme swelling of your face, hands, ankles, feet, or legs.  You have not felt your baby move in over an hour.  You have severe headaches that do not go away with medicine.  You have vision changes. Document Released: 09/16/2001 Document Revised: 09/27/2013 Document Reviewed: 11/23/2012 [facility] Patient Information 2015 [employer] [place]. This information is not intended to replace advice given to you by your health care provider. Make sure you discuss any questions you have with your health care provider.

## 2014-09-06 LAB — ANTIBODY SCREEN: Antibody Screen: NEGATIVE

## 2014-09-06 LAB — HIV ANTIBODY (ROUTINE TESTING W REFLEX): HIV 1&2 Ab, 4th Generation: NONREACTIVE

## 2014-09-06 LAB — GLUCOSE TOLERANCE, 2 HOURS W/ 1HR
GLUCOSE, 2 HOUR: 114 mg/dL (ref 70–139)
GLUCOSE, FASTING: 67 mg/dL — AB (ref 70–99)
GLUCOSE: 108 mg/dL (ref 70–170)

## 2014-09-06 LAB — HSV 2 ANTIBODY, IGG

## 2014-09-06 LAB — RPR

## 2014-09-11 ENCOUNTER — Telehealth: Payer: Self-pay | Admitting: *Deleted

## 2014-09-11 NOTE — Telephone Encounter (Signed)
Pt informed of normal lab results other than slightly low hemoglobin, advised her to make sure she is taking her PNV and encouraged her to eat iron rich foods.  Pt verbalized understanding.

## 2014-09-20 ENCOUNTER — Emergency Department (HOSPITAL_COMMUNITY)
Admission: EM | Admit: 2014-09-20 | Discharge: 2014-09-21 | Disposition: A | Discharge status: Home / Self Care | Payer: Medicaid Other | Attending: Emergency Medicine | Admitting: Emergency Medicine

## 2014-09-20 ENCOUNTER — Encounter (HOSPITAL_COMMUNITY): Payer: Self-pay | Admitting: Emergency Medicine

## 2014-09-20 DIAGNOSIS — K219 Gastro-esophageal reflux disease without esophagitis: Secondary | ICD-10-CM | POA: Insufficient documentation

## 2014-09-20 DIAGNOSIS — O9989 Other specified diseases and conditions complicating pregnancy, childbirth and the puerperium: Secondary | ICD-10-CM | POA: Insufficient documentation

## 2014-09-20 DIAGNOSIS — J45909 Unspecified asthma, uncomplicated: Secondary | ICD-10-CM | POA: Diagnosis not present

## 2014-09-20 DIAGNOSIS — O99613 Diseases of the digestive system complicating pregnancy, third trimester: Secondary | ICD-10-CM | POA: Diagnosis not present

## 2014-09-20 DIAGNOSIS — Z79899 Other long term (current) drug therapy: Secondary | ICD-10-CM | POA: Insufficient documentation

## 2014-09-20 DIAGNOSIS — O99513 Diseases of the respiratory system complicating pregnancy, third trimester: Secondary | ICD-10-CM | POA: Insufficient documentation

## 2014-09-20 DIAGNOSIS — R55 Syncope and collapse: Secondary | ICD-10-CM | POA: Diagnosis not present

## 2014-09-20 DIAGNOSIS — Z3A00 Weeks of gestation of pregnancy not specified: Secondary | ICD-10-CM | POA: Diagnosis not present

## 2014-09-20 LAB — URINE MICROSCOPIC-ADD ON

## 2014-09-20 LAB — CBC WITH DIFFERENTIAL/PLATELET
BASOS ABS: 0 10*3/uL (ref 0.0–0.1)
Basophils Relative: 0 % (ref 0–1)
Eosinophils Absolute: 0 10*3/uL (ref 0.0–0.7)
Eosinophils Relative: 1 % (ref 0–5)
HCT: 32.3 % — ABNORMAL LOW (ref 36.0–46.0)
Hemoglobin: 10.9 g/dL — ABNORMAL LOW (ref 12.0–15.0)
LYMPHS PCT: 12 % (ref 12–46)
Lymphs Abs: 0.8 10*3/uL (ref 0.7–4.0)
MCH: 31.4 pg (ref 26.0–34.0)
MCHC: 33.7 g/dL (ref 30.0–36.0)
MCV: 93.1 fL (ref 78.0–100.0)
MONO ABS: 0.5 10*3/uL (ref 0.1–1.0)
Monocytes Relative: 7 % (ref 3–12)
Neutro Abs: 5.5 10*3/uL (ref 1.7–7.7)
Neutrophils Relative %: 80 % — ABNORMAL HIGH (ref 43–77)
Platelets: 208 10*3/uL (ref 150–400)
RBC: 3.47 MIL/uL — ABNORMAL LOW (ref 3.87–5.11)
RDW: 13.2 % (ref 11.5–15.5)
WBC: 6.8 10*3/uL (ref 4.0–10.5)

## 2014-09-20 LAB — BASIC METABOLIC PANEL
ANION GAP: 11 (ref 5–15)
BUN: 7 mg/dL (ref 6–23)
CALCIUM: 9.3 mg/dL (ref 8.4–10.5)
CO2: 24 mEq/L (ref 19–32)
Chloride: 103 mEq/L (ref 96–112)
Creatinine, Ser: 0.67 mg/dL (ref 0.50–1.10)
GFR calc Af Amer: >90 mL/min (ref >90)
GFR calc non Af Amer: >90 mL/min (ref >90)
Glucose, Bld: 75 mg/dL (ref 70–99)
Potassium: 3.7 mEq/L (ref 3.7–5.3)
Sodium: 138 mEq/L (ref 137–147)

## 2014-09-20 LAB — URINALYSIS, ROUTINE W REFLEX MICROSCOPIC
Bilirubin Urine: NEGATIVE
GLUCOSE, UA: NEGATIVE mg/dL
HGB URINE DIPSTICK: NEGATIVE
Nitrite: POSITIVE — AB
Protein, ur: NEGATIVE mg/dL
Specific Gravity, Urine: 1.025 (ref 1.005–1.030)
Urobilinogen, UA: 1 mg/dL (ref 0.0–1.0)
pH: 6 (ref 5.0–8.0)

## 2014-09-20 NOTE — ED Provider Notes (Signed)
CSN: [id number]     Arrival date & time 09/20/14  2118 History  This chart was scribed for Monica SeamenJohn L Kristell Wooding, MD by Annye AsaAnna Dorsett, ED Scribe. This patient was seen in room APA10/APA10 and the patient's care was started at 11:10 PM.    Chief Complaint  Patient presents with  . Dizziness   HPI   HPI Comments: Monica Cox is a pregnant 23 y.o. female who presents to the Emergency Department complaining of gradually worsening, intermittent spells of lightheadedness with dimming of vision, hyperventilation and paresthesias in her hands. Symptoms only come on when ambulating, at which time she normally takes a seat until her symptoms improve. Just prior to arrival she had another episode but her symptoms did not improve as quickly as she expected. This prompted her to come to the ED. She denies LOC. She denies nausea, vomiting, chest pain or heart palpitations. She denies prior experience with these symptoms; these symptoms only began with pregnancy.   [facility] She is [redacted] weeks pregnant at this time.   Past Medical History  Diagnosis Date  . Asthma   . Reflux   . Pregnant    Past Surgical History  Procedure Laterality Date  . None    . Esophagogastroduodenoscopy endoscopy    . Dilation and curettage of uterus      [facility] Dr. Despina HiddenEure performed   Family History  Problem Relation Age of Onset  . Ulcers Father   . Ulcers Paternal Grandmother   . Diabetes Paternal Grandmother   . Ulcers Paternal Aunt   . Liver disease Neg Hx   . Colon cancer Neg Hx    History  Substance Use Topics  . Smoking status: Never Smoker   . Smokeless tobacco: Not on file  . Alcohol Use: No     Comment: occasionally   OB History    Gravida Para Term Preterm AB TAB SAB Ectopic Multiple Living   3 1 1  0 1 0 1 0 0 1     Review of Systems A complete 10 system review of systems was obtained and all systems are negative except as noted in the HPI and PMH.   Allergies  Review of patient's allergies  indicates no known allergies.  Home Medications   Prior to Admission medications   Medication Sig Start Date End Date Taking? Authorizing Provider  pantoprazole (PROTONIX) 20 MG tablet Take 1 tablet (20 mg total) by mouth daily. 04/26/14  Yes Marge DuncansKimberly Randall Booker, CNM  Prenatal Multivit-Min-Fe-FA (PRENATAL VITAMINS) 0.8 MG tablet Take 1 tablet by mouth daily. 04/03/14  Yes Vanetta MuldersScott Zackowski, MD  acetaminophen (TYLENOL) 325 MG tablet Take 650 mg by mouth every 6 (six) hours as needed (pain).     Historical Provider, MD  albuterol (PROVENTIL HFA;VENTOLIN HFA) 108 (90 BASE) MCG/ACT inhaler Inhale 2 puffs into the lungs every 6 (six) hours as needed for wheezing or shortness of breath.    Historical Provider, MD  Doxylamine-Pyridoxine (DICLEGIS) 10-10 MG TBEC Take 10 mg by mouth See admin instructions. Patient not taking: Reported on 09/05/2014 04/26/14   Marge DuncansKimberly Randall Booker, CNM  escitalopram (LEXAPRO) 10 MG tablet Take 1 tablet (10 mg total) by mouth daily. Patient not taking: Reported on 09/05/2014 06/21/14   Marge DuncansKimberly Randall Booker, CNM  metroNIDAZOLE (FLAGYL) 500 MG tablet Take 4 tablets (2,000 mg total) by mouth once. Do not drink alcohol while taking medicine! Patient not taking: Reported on 09/05/2014 08/08/14   Marge DuncansKimberly Randall Booker, CNM  Prenat-FeCbn-FeAspGl-FA-Omega (OB COMPLETE PETITE) 35-5-1-200 MG CAPS Take 1 capsule by mouth daily. Patient not taking: Reported on 09/05/2014 04/26/14   Marge DuncansKimberly Randall Booker, CNM  triamcinolone (KENALOG) 0.025 % cream Apply 1 application topically daily as needed. 05/24/14   Marge DuncansKimberly Randall Booker, CNM   BP 112/81 mmHg  Pulse 84  Temp(Src) 98.5 F (36.9 C) (Oral)  Resp 17  Ht 5' 3.5" (1.613 m)  Wt 179 lb 8 oz (81.421 kg)  BMI 31.29 kg/m2  SpO2 100%  LMP 02/26/2014   Physical Exam  Nursing note and vitals reviewed. General: Well-developed, well-nourished female in no acute distress; appearance consistent with age of record HENT:  normocephalic; atraumatic Eyes: pupils equal, round and reactive to light; extraocular muscles intact Neck: supple Heart: regular rate and rhythm; systolic murmur loudest at the LUSB Lungs: clear to auscultation bilaterally Abdomen: soft; gravid, consistent with dates; nontender; bowel sounds present Extremities: No deformity; full range of motion; pulses normal Neurologic: Awake, alert and oriented; motor function intact in all extremities and symmetric; no facial droop Skin: Warm and dry Psychiatric: Normal mood and affect  ED Course  Procedures   DIAGNOSTIC STUDIES: Oxygen Saturation is 100% on RA, normal by my interpretation.    COORDINATION OF CARE: 11:16 PM Discussed treatment plan with pt at bedside and pt agreed to plan.   EKG Interpretation  Date/Time:  Wednesday September 20 2014 23:31:01 EST Ventricular Rate:  88 PR Interval:  157 QRS Duration: 75 QT Interval:  352 QTC Calculation: 426 R Axis:   63 Text Interpretation:  Sinus rhythm Normal ECG No significant change was found Confirmed by Nataleigh Griffin  MD, Jonny RuizJOHN ([id number]) on 09/20/2014 11:36:31 PM      MDM   Final diagnoses:  None   Results for orders placed or performed during the hospital encounter of 09/20/14 (from the past 24 hour(s))  CBC with Differential     Status: Abnormal   Collection Time: 09/20/14 11:25 PM  Result Value Ref Range   WBC 6.8 4.0 - 10.5 K/uL   RBC 3.47 (L) 3.87 - 5.11 MIL/uL   Hemoglobin 10.9 (L) 12.0 - 15.0 g/dL   HCT 60.432.3 (L) 54.036.0 - 98.146.0 %   MCV 93.1 78.0 - 100.0 fL   MCH 31.4 26.0 - 34.0 pg   MCHC 33.7 30.0 - 36.0 g/dL   RDW 19.113.2 47.811.5 - 29.515.5 %   Platelets 208 150 - 400 K/uL   Neutrophils Relative % 80 (H) 43 - 77 %   Neutro Abs 5.5 1.7 - 7.7 K/uL   Lymphocytes Relative 12 12 - 46 %   Lymphs Abs 0.8 0.7 - 4.0 K/uL   Monocytes Relative 7 3 - 12 %   Monocytes Absolute 0.5 0.1 - 1.0 K/uL   Eosinophils Relative 1 0 - 5 %   Eosinophils Absolute 0.0 0.0 - 0.7 K/uL   Basophils Relative 0  0 - 1 %   Basophils Absolute 0.0 0.0 - 0.1 K/uL  Basic metabolic panel     Status: None   Collection Time: 09/20/14 11:25 PM  Result Value Ref Range   Sodium 138 137 - 147 mEq/L   Potassium 3.7 3.7 - 5.3 mEq/L   Chloride 103 96 - 112 mEq/L   CO2 24 19 - 32 mEq/L   Glucose, Bld 75 70 - 99 mg/dL   BUN 7 6 - 23 mg/dL   Creatinine, Ser 6.210.67 0.50 - 1.10 mg/dL   Calcium 9.3 8.4 - 30.810.5 mg/dL   GFR calc  non Af Amer >90 >90 mL/min   GFR calc Af Amer >90 >90 mL/min   Anion gap 11 5 - 15  Urinalysis, Routine w reflex microscopic     Status: Abnormal   Collection Time: 09/20/14 11:28 PM  Result Value Ref Range   Color, Urine YELLOW YELLOW   APPearance CLEAR CLEAR   Specific Gravity, Urine 1.025 1.005 - 1.030   pH 6.0 5.0 - 8.0   Glucose, UA NEGATIVE NEGATIVE mg/dL   Hgb urine dipstick NEGATIVE NEGATIVE   Bilirubin Urine NEGATIVE NEGATIVE   Ketones, ur TRACE (A) NEGATIVE mg/dL   Protein, ur NEGATIVE NEGATIVE mg/dL   Urobilinogen, UA 1.0 0.0 - 1.0 mg/dL   Nitrite POSITIVE (A) NEGATIVE   Leukocytes, UA TRACE (A) NEGATIVE  Urine microscopic-add on     Status: Abnormal   Collection Time: 09/20/14 11:28 PM  Result Value Ref Range   Squamous Epithelial / LPF MANY (A) RARE   WBC, UA 3-6 <3 WBC/hpf   RBC / HPF 0-2 <3 RBC/hpf   Bacteria, UA MANY (A) RARE   12:35 AM No objective orthostasis. Patient still feels a mild headache and general weakness. She was advised to follow-up with her OB/GYN later today.  I personally performed the services described in this documentation, which was scribed in my presence. The recorded information has been reviewed and is accurate.     Monica Seamen, MD 09/21/14 [phone]

## 2014-09-20 NOTE — ED Notes (Signed)
Pt states she has been getting dizzy for about a week but that usually when she sits down, her vision clears. Now, she states when she sits down she still is dizzy and her field of vision stays black.

## 2014-09-21 ENCOUNTER — Ambulatory Visit (INDEPENDENT_AMBULATORY_CARE_PROVIDER_SITE_OTHER): Payer: Medicaid Other | Admitting: Advanced Practice Midwife

## 2014-09-21 VITALS — BP 104/60 | Wt 179.0 lb

## 2014-09-21 DIAGNOSIS — Z331 Pregnant state, incidental: Secondary | ICD-10-CM

## 2014-09-21 DIAGNOSIS — Z3483 Encounter for supervision of other normal pregnancy, third trimester: Secondary | ICD-10-CM

## 2014-09-21 DIAGNOSIS — R42 Dizziness and giddiness: Secondary | ICD-10-CM

## 2014-09-21 DIAGNOSIS — R011 Cardiac murmur, unspecified: Secondary | ICD-10-CM

## 2014-09-21 DIAGNOSIS — Z1389 Encounter for screening for other disorder: Secondary | ICD-10-CM

## 2014-09-21 HISTORY — DX: Dizziness and giddiness: R42

## 2014-09-21 LAB — POCT URINALYSIS DIPSTICK
Blood, UA: NEGATIVE
Glucose, UA: NEGATIVE
KETONES UA: NEGATIVE
Leukocytes, UA: NEGATIVE
Nitrite, UA: NEGATIVE
Protein, UA: NEGATIVE

## 2014-09-21 MED ORDER — MECLIZINE HCL 32 MG PO TABS: 32.0000 mg | ORAL_TABLET | Freq: Three times a day (TID) | ORAL | Status: DC | PRN

## 2014-09-21 MED ORDER — FERROUS SULFATE 325 (65 FE) MG PO TABS: 325.0000 mg | ORAL_TABLET | Freq: Every day | ORAL | Status: DC

## 2014-09-21 MED ORDER — ACETAMINOPHEN 325 MG PO TABS
650.0000 mg | ORAL_TABLET | Freq: Once | ORAL | Status: AC
  Administered 2014-09-21: 650 mg via ORAL
  Filled 2014-09-21: qty 2

## 2014-09-21 NOTE — Discharge Instructions (Signed)
Near-Syncope Near-syncope (commonly known as near fainting) is sudden weakness, dizziness, or feeling like you might pass out. During an episode of near-syncope, you may also develop pale skin, have tunnel vision, or feel sick to your stomach (nauseous). Near-syncope may occur when getting up after sitting or while standing for a long time. It is caused by a sudden decrease in blood flow to the brain. This decrease can result from various causes or triggers, most of which are not serious. However, because near-syncope can sometimes be a sign of something serious, a medical evaluation is required. The specific cause is often not determined. HOME CARE INSTRUCTIONS  Monitor your condition for any changes. The following actions may help to alleviate any discomfort you are experiencing:  Have someone stay with you until you feel stable.  Lie down right away and prop your feet up if you start feeling like you might faint. Breathe deeply and steadily. Wait until all the symptoms have passed. Most of these episodes last only a few minutes. You may feel tired for several hours.   Drink enough fluids to keep your urine clear or pale yellow.   If you are taking blood pressure or heart medicine, get up slowly when seated or lying down. Take several minutes to sit and then stand. This can reduce dizziness.  Follow up with your health care provider as directed. SEEK IMMEDIATE MEDICAL CARE IF:   You have a severe headache.   You have unusual pain in the chest, abdomen, or back.   You are bleeding from the mouth or rectum, or you have black or tarry stool.   You have an irregular or very fast heartbeat.   You have repeated fainting or have seizure-like jerking during an episode.   You faint when sitting or lying down.   You have confusion.   You have difficulty walking.   You have severe weakness.   You have vision problems.  MAKE SURE YOU:   Understand these instructions.  Will  watch your condition.  Will get help right away if you are not doing well or get worse. Document Released: 09/22/2005 Document Revised: 09/27/2013 Document Reviewed: 02/25/2013 [employer] Patient Information 2015 [employer]. This information is not intended to replace advice given to you by your health care provider. Make sure you discuss any questions you have with your health care provider.  

## 2014-09-21 NOTE — Progress Notes (Signed)
[id number] Estimated Date of Delivery: 12/03/14  Blood pressure 104/60, weight 179 lb (81.194 kg), last menstrual period 02/26/2014.   WORK IN: went to ED for epidsode of dizziness.  Happens even when supine, episode getting more frequent.  Sometimes has vision changes and hands and feet tingle.  BP weight and urine results all reviewed and noted   Plan:  Continued routine obstetrical care, Try antivert. Add FeSO4 (hgb 10.2), eat and drink frequently  Follow up as scheduled for OB appointment,

## 2014-09-22 LAB — URINE CULTURE

## 2014-10-03 ENCOUNTER — Encounter: Payer: Medicaid Other | Admitting: Advanced Practice Midwife

## 2014-10-06 NOTE — L&D Delivery Note (Signed)
Delivery Note At 11:13 AM a viable and healthy female was delivered via Vaginal, Spontaneous Delivery (Presentation: ; Occiput Anterior).  APGAR: 9, 9; weight pending .   Placenta status: Intact, Spontaneous.  Cord: 3 vessels with the following complications: Loose nuchal.  Cord pH: n/a  Anesthesia: Epidural  Episiotomy: None Lacerations: 2nd degree;Perineal Suture Repair: 3.0 vicryl rapide Est. Blood Loss (mL): 100  Mom to postpartum.  Baby to Couplet care / Skin to Skin.  Delivery and repair supervised by Fredirick LatheKristy Acosta, MD  Felton Clintonoss,Lisa Wynne 11/19/2014, 11:41 AM   `````Attestation of Attending Supervision of Advanced Practitioner: Evaluation and management procedures were performed by the PA/NP/CNM/OB Fellow under my supervision/collaboration. Chart reviewed and agree with management and plan.  Tilda BurrowFERGUSON,Sheina Mcleish V 11/19/2014 5:34 PM

## 2014-10-09 ENCOUNTER — Ambulatory Visit (INDEPENDENT_AMBULATORY_CARE_PROVIDER_SITE_OTHER): Payer: Medicaid Other | Admitting: Obstetrics and Gynecology

## 2014-10-09 ENCOUNTER — Encounter: Payer: Medicaid Other | Admitting: Obstetrics and Gynecology

## 2014-10-09 ENCOUNTER — Encounter: Payer: Self-pay | Admitting: Obstetrics and Gynecology

## 2014-10-09 VITALS — BP 108/68 | Wt 183.0 lb

## 2014-10-09 DIAGNOSIS — O360131 Maternal care for anti-D [Rh] antibodies, third trimester, fetus 1: Secondary | ICD-10-CM

## 2014-10-09 DIAGNOSIS — Z331 Pregnant state, incidental: Secondary | ICD-10-CM

## 2014-10-09 DIAGNOSIS — Z1389 Encounter for screening for other disorder: Secondary | ICD-10-CM

## 2014-10-09 LAB — POCT URINALYSIS DIPSTICK
Glucose, UA: NEGATIVE
Ketones, UA: NEGATIVE
Leukocytes, UA: NEGATIVE
NITRITE UA: NEGATIVE
PROTEIN UA: NEGATIVE
RBC UA: NEGATIVE

## 2014-10-09 MED ORDER — RHO D IMMUNE GLOBULIN 1500 UNIT/2ML IJ SOSY
300.0000 ug | PREFILLED_SYRINGE | Freq: Once | INTRAMUSCULAR | Status: AC
  Administered 2014-10-09: 300 ug via INTRAMUSCULAR

## 2014-10-09 NOTE — Progress Notes (Signed)
Patient ID: Monica Cox, female   DOB: 04-19-1991, 24 y.o.   MRN: [id number] [redacted]w[redacted]d Estimated Date of Delivery: 12/03/14  Blood pressure 108/68, weight 183 lb (83.008 kg), last menstrual period 02/26/2014.   refer to the ob flow sheet for FH and FHR, also BP, Wt, Urine results: negative  Patient reports +good fetal movement, denies any bleeding and no rupture of membranes symptoms or regular contractions. Patient complaints: She does not have any complaints at this time.  She was given a shot of Rhogam at today's visit.    FHR - 137 FH - 34cm   Questions were answered. Assessment: [redacted]w[redacted]d, G3P1011  Routine prenatal care Plan:  Continued routine obstetrical care  F/u in 2 weeks   This chart was scribed for Tilda Burrow, MD by Carl Best, ED Scribe. This patient was seen in Room 1 and the patient's care was started at 11:34 AM.

## 2014-10-09 NOTE — Progress Notes (Signed)
Pt denies any problems or concerns at this time. Pt given Rhogam shot today!

## 2014-10-25 ENCOUNTER — Ambulatory Visit (INDEPENDENT_AMBULATORY_CARE_PROVIDER_SITE_OTHER): Payer: Medicaid Other | Admitting: Women's Health

## 2014-10-25 VITALS — BP 124/62 | Wt 192.0 lb

## 2014-10-25 DIAGNOSIS — Z3483 Encounter for supervision of other normal pregnancy, third trimester: Secondary | ICD-10-CM

## 2014-10-25 DIAGNOSIS — Z331 Pregnant state, incidental: Secondary | ICD-10-CM

## 2014-10-25 DIAGNOSIS — O360131 Maternal care for anti-D [Rh] antibodies, third trimester, fetus 1: Secondary | ICD-10-CM

## 2014-10-25 DIAGNOSIS — Z1389 Encounter for screening for other disorder: Secondary | ICD-10-CM

## 2014-10-25 MED ORDER — MECLIZINE HCL 32 MG PO TABS: 32.0000 mg | ORAL_TABLET | Freq: Three times a day (TID) | ORAL | Status: DC | PRN

## 2014-10-25 NOTE — Patient Instructions (Signed)
Call the office ([phone]) or go to [facility] if:  You begin to have strong, frequent contractions  Your water breaks.  Sometimes it is a big gush of fluid, sometimes it is just a trickle that keeps getting your panties wet or running down your legs  You have vaginal bleeding.  It is normal to have a small amount of spotting if your cervix was checked.   You don't feel your baby moving like normal.  If you don't, get you something to eat and drink and lay down and focus on feeling your baby move.  You should feel at least 10 movements in 2 hours.  If you don't, you should call the office or go to [facility].    Preterm Labor Information Preterm labor is when labor starts at less than 37 weeks of pregnancy. The normal length of a pregnancy is 39 to 41 weeks. CAUSES Often, there is no identifiable underlying cause as to why a woman goes into preterm labor. One of the most common known causes of preterm labor is infection. Infections of the uterus, cervix, vagina, amniotic sac, bladder, kidney, or even the lungs (pneumonia) can cause labor to start. Other suspected causes of preterm labor include:   Urogenital infections, such as yeast infections and bacterial vaginosis.   Uterine abnormalities (uterine shape, uterine septum, fibroids, or bleeding from the placenta).   A cervix that has been operated on (it may fail to stay closed).   Malformations in the fetus.   Multiple gestations (twins, triplets, and so on).   Breakage of the amniotic sac.  RISK FACTORS  Having a previous history of preterm labor.   Having premature rupture of membranes (PROM).   Having a placenta that covers the opening of the cervix (placenta previa).   Having a placenta that separates from the uterus (placental abruption).   Having a cervix that is too weak to hold the fetus in the uterus (incompetent cervix).   Having too much fluid in the amniotic sac (polyhydramnios).   Taking  illegal drugs or smoking while pregnant.   Not gaining enough weight while pregnant.   Being younger than 18 and older than 26 years old.   Having a low socioeconomic status.   Being African American. SYMPTOMS Signs and symptoms of preterm labor include:   Menstrual-like cramps, abdominal pain, or back pain.  Uterine contractions that are regular, as frequent as six in an hour, regardless of their intensity (may be mild or painful).  Contractions that start on the top of the uterus and spread down to the lower abdomen and back.   A sense of increased pelvic pressure.   A watery or bloody mucus discharge that comes from the vagina.  TREATMENT Depending on the length of the pregnancy and other circumstances, your health care provider may suggest bed rest. If necessary, there are medicines that can be given to stop contractions and to mature the fetal lungs. If labor happens before 34 weeks of pregnancy, a prolonged hospital stay may be recommended. Treatment depends on the condition of both you and the fetus.  WHAT SHOULD YOU DO IF YOU THINK YOU ARE IN PRETERM LABOR? Call your health care provider right away. You will need to go to the hospital to get checked immediately. HOW CAN YOU PREVENT PRETERM LABOR IN FUTURE PREGNANCIES? You should:   Stop smoking if you smoke.  Maintain healthy weight gain and avoid chemicals and drugs that are not necessary.  Be watchful for   any type of infection.  Inform your health care provider if you have a known history of preterm labor. Document Released: 12/13/2003 Document Revised: 05/25/2013 Document Reviewed: 10/25/2012 ExitCare Patient Information 2015 [employer]. This information is not intended to replace advice given to you by your health care provider. Make sure you discuss any questions you have with your health care provider.  

## 2014-10-25 NOTE — Progress Notes (Signed)
Low-risk OB appointment G3P1011 4310w3d Estimated Date of Delivery: 12/03/14 BP 124/62 mmHg  Wt 192 lb (87.091 kg)  LMP 02/26/2014  BP, weight, and urine reviewed.  Refer to obstetrical flow sheet for FH & FHR.  Reports good fm.  Denies regular uc's, vb, or uti s/s. Wet liquid in underwear every time after going to br and cleaning then going to sit down.  SSE: cx visually closed, mod amount thick clumpy d/c- denies itching/irritation, no pooling of fluid, fern neg- no wet prep done since pt asymptomatic for yeast  Reviewed ptl s/s, fkc. Plan:  Continue routine obstetrical care  F/U in 2wks for OB appointment

## 2014-11-08 ENCOUNTER — Encounter: Payer: Medicaid Other | Admitting: Advanced Practice Midwife

## 2014-11-08 ENCOUNTER — Encounter: Payer: Self-pay | Admitting: Women's Health

## 2014-11-08 ENCOUNTER — Ambulatory Visit (INDEPENDENT_AMBULATORY_CARE_PROVIDER_SITE_OTHER): Payer: Medicaid Other | Admitting: Women's Health

## 2014-11-08 VITALS — BP 120/62 | Wt 192.0 lb

## 2014-11-08 DIAGNOSIS — Z3483 Encounter for supervision of other normal pregnancy, third trimester: Secondary | ICD-10-CM

## 2014-11-08 DIAGNOSIS — Z331 Pregnant state, incidental: Secondary | ICD-10-CM

## 2014-11-08 DIAGNOSIS — Z1389 Encounter for screening for other disorder: Secondary | ICD-10-CM

## 2014-11-08 LAB — POCT URINALYSIS DIPSTICK
Blood, UA: NEGATIVE
GLUCOSE UA: NEGATIVE
Glucose, UA: NEGATIVE
Ketones, UA: NEGATIVE
Nitrite, UA: NEGATIVE
PROTEIN UA: NEGATIVE
PROTEIN UA: NEGATIVE

## 2014-11-08 NOTE — Patient Instructions (Signed)
Call the office ([phone]) or go to [facility] if:  You begin to have strong, frequent contractions  Your water breaks.  Sometimes it is a big gush of fluid, sometimes it is just a trickle that keeps getting your panties wet or running down your legs  You have vaginal bleeding.  It is normal to have a small amount of spotting if your cervix was checked.   You don't feel your baby moving like normal.  If you don't, get you something to eat and drink and lay down and focus on feeling your baby move.  You should feel at least 10 movements in 2 hours.  If you don't, you should call the office or go to [facility].    Braxton Hicks Contractions Contractions of the uterus can occur throughout pregnancy. Contractions are not always a sign that you are in labor.  WHAT ARE BRAXTON HICKS CONTRACTIONS?  Contractions that occur before labor are called Braxton Hicks contractions, or false labor. Toward the end of pregnancy (32-34 weeks), these contractions can develop more often and may become more forceful. This is not true labor because these contractions do not result in opening (dilatation) and thinning of the cervix. They are sometimes difficult to tell apart from true labor because these contractions can be forceful and people have different pain tolerances. You should not feel embarrassed if you go to the hospital with false labor. Sometimes, the only way to tell if you are in true labor is for your health care provider to look for changes in the cervix. If there are no prenatal problems or other health problems associated with the pregnancy, it is completely safe to be sent home with false labor and await the onset of true labor. HOW CAN YOU TELL THE DIFFERENCE BETWEEN TRUE AND FALSE LABOR? False Labor  The contractions of false labor are usually shorter and not as hard as those of true labor.   The contractions are usually irregular.   The contractions are often felt in the front of  the lower abdomen and in the groin.   The contractions may go away when you walk around or change positions while lying down.   The contractions get weaker and are shorter lasting as time goes on.   The contractions do not usually become progressively stronger, regular, and closer together as with true labor.  True Labor  Contractions in true labor last 30-70 seconds, become very regular, usually become more intense, and increase in frequency.   The contractions do not go away with walking.   The discomfort is usually felt in the top of the uterus and spreads to the lower abdomen and low back.   True labor can be determined by your health care provider with an exam. This will show that the cervix is dilating and getting thinner.  WHAT TO REMEMBER  Keep up with your usual exercises and follow other instructions given by your health care provider.   Take medicines as directed by your health care provider.   Keep your regular prenatal appointments.   Eat and drink lightly if you think you are going into labor.   If Braxton Hicks contractions are making you uncomfortable:   Change your position from lying down or resting to walking, or from walking to resting.   Sit and rest in a tub of warm water.   Drink 2-3 glasses of water. Dehydration may cause these contractions.   Do slow and deep breathing several times an hour.    WHEN SHOULD I SEEK IMMEDIATE MEDICAL CARE? Seek immediate medical care if:  Your contractions become stronger, more regular, and closer together.   You have fluid leaking or gushing from your vagina.   You have a fever.   You pass blood-tinged mucus.   You have vaginal bleeding.   You have continuous abdominal pain.   You have low back pain that you never had before.   You feel your baby's head pushing down and causing pelvic pressure.   Your baby is not moving as much as it used to.  Document Released: 09/22/2005 Document  Revised: 09/27/2013 Document Reviewed: 07/04/2013 ExitCare Patient Information 2015 [employer]. This information is not intended to replace advice given to you by your health care provider. Make sure you discuss any questions you have with your health care provider.  

## 2014-11-08 NOTE — Progress Notes (Signed)
Low-risk OB appointment G3P1011 8142w3d Estimated Date of Delivery: 12/03/14 BP 120/62 mmHg  Wt 192 lb (87.091 kg)  LMP 02/26/2014  BP, weight, and urine reviewed.  Refer to obstetrical flow sheet for FH & FHR.  Reports good fm.  Denies regular uc's, lof, vb, or uti s/s. No complaints. Requests SVE: 1/th/-3, vtx Reviewed ptl s/s, fkc. Plan:  Continue routine obstetrical care  F/U in 1wk for OB appointment and gbs

## 2014-11-14 ENCOUNTER — Ambulatory Visit (INDEPENDENT_AMBULATORY_CARE_PROVIDER_SITE_OTHER): Payer: Medicaid Other | Admitting: Women's Health

## 2014-11-14 VITALS — BP 118/72 | Wt 195.0 lb

## 2014-11-14 DIAGNOSIS — Z3483 Encounter for supervision of other normal pregnancy, third trimester: Secondary | ICD-10-CM

## 2014-11-14 DIAGNOSIS — Z1389 Encounter for screening for other disorder: Secondary | ICD-10-CM

## 2014-11-14 DIAGNOSIS — Z3685 Encounter for antenatal screening for Streptococcus B: Secondary | ICD-10-CM

## 2014-11-14 DIAGNOSIS — Z1159 Encounter for screening for other viral diseases: Secondary | ICD-10-CM

## 2014-11-14 DIAGNOSIS — Z118 Encounter for screening for other infectious and parasitic diseases: Secondary | ICD-10-CM

## 2014-11-14 DIAGNOSIS — Z331 Pregnant state, incidental: Secondary | ICD-10-CM

## 2014-11-14 LAB — POCT URINALYSIS DIPSTICK
Blood, UA: NEGATIVE
Glucose, UA: NEGATIVE
Ketones, UA: NEGATIVE
NITRITE UA: NEGATIVE

## 2014-11-14 NOTE — Progress Notes (Signed)
Low-risk OB appointment G3P1011 365w2d Estimated Date of Delivery: 12/03/14 BP 118/72 mmHg  Wt 195 lb (88.451 kg)  LMP 02/26/2014  BP, weight, and urine reviewed.  Refer to obstetrical flow sheet for FH & FHR.  Reports good fm.  Denies regular uc's, lof, vb, or uti s/s. No complaints. Lost mucous plug last week. GBS collected SVE per request: 1.5/50/-2, vtx Reviewed labor s/s, fkc. Plan:  Continue routine obstetrical care  F/U in 1wk for OB appointment

## 2014-11-14 NOTE — Patient Instructions (Signed)
Call the office ([phone]) or go to [facility] if:  You begin to have strong, frequent contractions  Your water breaks.  Sometimes it is a big gush of fluid, sometimes it is just a trickle that keeps getting your panties wet or running down your legs  You have vaginal bleeding.  It is normal to have a small amount of spotting if your cervix was checked.   You don't feel your baby moving like normal.  If you don't, get you something to eat and drink and lay down and focus on feeling your baby move.  You should feel at least 10 movements in 2 hours.  If you don't, you should call the office or go to [facility].    Braxton Hicks Contractions Contractions of the uterus can occur throughout pregnancy. Contractions are not always a sign that you are in labor.  WHAT ARE BRAXTON HICKS CONTRACTIONS?  Contractions that occur before labor are called Braxton Hicks contractions, or false labor. Toward the end of pregnancy (32-34 weeks), these contractions can develop more often and may become more forceful. This is not true labor because these contractions do not result in opening (dilatation) and thinning of the cervix. They are sometimes difficult to tell apart from true labor because these contractions can be forceful and people have different pain tolerances. You should not feel embarrassed if you go to the hospital with false labor. Sometimes, the only way to tell if you are in true labor is for your health care provider to look for changes in the cervix. If there are no prenatal problems or other health problems associated with the pregnancy, it is completely safe to be sent home with false labor and await the onset of true labor. HOW CAN YOU TELL THE DIFFERENCE BETWEEN TRUE AND FALSE LABOR? False Labor  The contractions of false labor are usually shorter and not as hard as those of true labor.   The contractions are usually irregular.   The contractions are often felt in the front of  the lower abdomen and in the groin.   The contractions may go away when you walk around or change positions while lying down.   The contractions get weaker and are shorter lasting as time goes on.   The contractions do not usually become progressively stronger, regular, and closer together as with true labor.  True Labor  Contractions in true labor last 30-70 seconds, become very regular, usually become more intense, and increase in frequency.   The contractions do not go away with walking.   The discomfort is usually felt in the top of the uterus and spreads to the lower abdomen and low back.   True labor can be determined by your health care provider with an exam. This will show that the cervix is dilating and getting thinner.  WHAT TO REMEMBER  Keep up with your usual exercises and follow other instructions given by your health care provider.   Take medicines as directed by your health care provider.   Keep your regular prenatal appointments.   Eat and drink lightly if you think you are going into labor.   If Braxton Hicks contractions are making you uncomfortable:   Change your position from lying down or resting to walking, or from walking to resting.   Sit and rest in a tub of warm water.   Drink 2-3 glasses of water. Dehydration may cause these contractions.   Do slow and deep breathing several times an hour.    WHEN SHOULD I SEEK IMMEDIATE MEDICAL CARE? Seek immediate medical care if:  Your contractions become stronger, more regular, and closer together.   You have fluid leaking or gushing from your vagina.   You have a fever.   You pass blood-tinged mucus.   You have vaginal bleeding.   You have continuous abdominal pain.   You have low back pain that you never had before.   You feel your baby's head pushing down and causing pelvic pressure.   Your baby is not moving as much as it used to.  Document Released: 09/22/2005 Document  Revised: 09/27/2013 Document Reviewed: 07/04/2013 ExitCare Patient Information 2015 [employer]. This information is not intended to replace advice given to you by your health care provider. Make sure you discuss any questions you have with your health care provider.  

## 2014-11-15 ENCOUNTER — Encounter: Payer: Self-pay | Admitting: Women's Health

## 2014-11-15 ENCOUNTER — Encounter (HOSPITAL_COMMUNITY): Payer: Self-pay | Admitting: *Deleted

## 2014-11-15 ENCOUNTER — Ambulatory Visit (INDEPENDENT_AMBULATORY_CARE_PROVIDER_SITE_OTHER): Payer: Medicaid Other | Admitting: Women's Health

## 2014-11-15 ENCOUNTER — Inpatient Hospital Stay (HOSPITAL_COMMUNITY)
Admission: AD | Admit: 2014-11-15 | Discharge: 2014-11-15 | Disposition: A | Discharge status: Home / Self Care | Payer: Medicaid Other | Source: Ambulatory Visit | Attending: Obstetrics & Gynecology | Admitting: Obstetrics & Gynecology

## 2014-11-15 VITALS — BP 120/68 | Wt 194.0 lb

## 2014-11-15 DIAGNOSIS — Z1389 Encounter for screening for other disorder: Secondary | ICD-10-CM

## 2014-11-15 DIAGNOSIS — O471 False labor at or after 37 completed weeks of gestation: Secondary | ICD-10-CM | POA: Diagnosis present

## 2014-11-15 DIAGNOSIS — Z3A37 37 weeks gestation of pregnancy: Secondary | ICD-10-CM | POA: Insufficient documentation

## 2014-11-15 DIAGNOSIS — Z3483 Encounter for supervision of other normal pregnancy, third trimester: Secondary | ICD-10-CM

## 2014-11-15 DIAGNOSIS — Z331 Pregnant state, incidental: Secondary | ICD-10-CM

## 2014-11-15 DIAGNOSIS — O479 False labor, unspecified: Secondary | ICD-10-CM

## 2014-11-15 HISTORY — DX: Unspecified abnormal cytological findings in specimens from vagina: R87.629

## 2014-11-15 HISTORY — DX: Trichomoniasis, unspecified: A59.9

## 2014-11-15 HISTORY — DX: Anemia, unspecified: D64.9

## 2014-11-15 LAB — POCT URINALYSIS DIPSTICK
Blood, UA: NEGATIVE
Glucose, UA: NEGATIVE
Ketones, UA: NEGATIVE
LEUKOCYTES UA: NEGATIVE
Nitrite, UA: NEGATIVE

## 2014-11-15 LAB — GROUP B STREP BY PCR: Group B strep by PCR: NEGATIVE

## 2014-11-15 LAB — OB RESULTS CONSOLE GBS: STREP GROUP B AG: NEGATIVE

## 2014-11-15 NOTE — Progress Notes (Signed)
[facility]  Low-risk OB appointment [id number] 8551w3d Estimated Date of Delivery: 12/03/14 LMP 02/26/2014  BP, weight, and urine reviewed.  Refer to obstetrical flow sheet for FH & FHR.  Reports good fm.  Regular uc's since last night. Denies lof, vb, or uti s/s. [facility] and [facility] to 4[facility] and was d/c'd. Called 1hr after d/c wanting to be seen here for cervical check before driving back to [place].  SVE: 4/70/-2, no change since d/c'd from [facility] Reviewed labor s/s, fkc. Plan:  Continue routine obstetrical care  F/U in 1wk for OB appointment if still pregnant

## 2014-11-15 NOTE — Discharge Instructions (Signed)

## 2014-11-15 NOTE — MAU Note (Signed)
Pt states she has been having uc's since 2300 last night, have become progressively stronger.  Denies bleeding or LOF.

## 2014-11-15 NOTE — Patient Instructions (Signed)
Call the office ([phone]) or go to [facility] if:  You begin to have strong, frequent contractions  Your water breaks.  Sometimes it is a big gush of fluid, sometimes it is just a trickle that keeps getting your panties wet or running down your legs  You have vaginal bleeding.  It is normal to have a small amount of spotting if your cervix was checked.   You don't feel your baby moving like normal.  If you don't, get you something to eat and drink and lay down and focus on feeling your baby move.  You should feel at least 10 movements in 2 hours.  If you don't, you should call the office or go to [facility].    Braxton Hicks Contractions Contractions of the uterus can occur throughout pregnancy. Contractions are not always a sign that you are in labor.  WHAT ARE BRAXTON HICKS CONTRACTIONS?  Contractions that occur before labor are called Braxton Hicks contractions, or false labor. Toward the end of pregnancy (32-34 weeks), these contractions can develop more often and may become more forceful. This is not true labor because these contractions do not result in opening (dilatation) and thinning of the cervix. They are sometimes difficult to tell apart from true labor because these contractions can be forceful and people have different pain tolerances. You should not feel embarrassed if you go to the hospital with false labor. Sometimes, the only way to tell if you are in true labor is for your health care provider to look for changes in the cervix. If there are no prenatal problems or other health problems associated with the pregnancy, it is completely safe to be sent home with false labor and await the onset of true labor. HOW CAN YOU TELL THE DIFFERENCE BETWEEN TRUE AND FALSE LABOR? False Labor  The contractions of false labor are usually shorter and not as hard as those of true labor.   The contractions are usually irregular.   The contractions are often felt in the front of  the lower abdomen and in the groin.   The contractions may go away when you walk around or change positions while lying down.   The contractions get weaker and are shorter lasting as time goes on.   The contractions do not usually become progressively stronger, regular, and closer together as with true labor.  True Labor  Contractions in true labor last 30-70 seconds, become very regular, usually become more intense, and increase in frequency.   The contractions do not go away with walking.   The discomfort is usually felt in the top of the uterus and spreads to the lower abdomen and low back.   True labor can be determined by your health care provider with an exam. This will show that the cervix is dilating and getting thinner.  WHAT TO REMEMBER  Keep up with your usual exercises and follow other instructions given by your health care provider.   Take medicines as directed by your health care provider.   Keep your regular prenatal appointments.   Eat and drink lightly if you think you are going into labor.   If Braxton Hicks contractions are making you uncomfortable:   Change your position from lying down or resting to walking, or from walking to resting.   Sit and rest in a tub of warm water.   Drink 2-3 glasses of water. Dehydration may cause these contractions.   Do slow and deep breathing several times an hour.    WHEN SHOULD I SEEK IMMEDIATE MEDICAL CARE? Seek immediate medical care if:  Your contractions become stronger, more regular, and closer together.   You have fluid leaking or gushing from your vagina.   You have a fever.   You pass blood-tinged mucus.   You have vaginal bleeding.   You have continuous abdominal pain.   You have low back pain that you never had before.   You feel your baby's head pushing down and causing pelvic pressure.   Your baby is not moving as much as it used to.  Document Released: 09/22/2005 Document  Revised: 09/27/2013 Document Reviewed: 07/04/2013 ExitCare Patient Information 2015 [employer]. This information is not intended to replace advice given to you by your health care provider. Make sure you discuss any questions you have with your health care provider.  

## 2014-11-15 NOTE — MAU Note (Signed)
Pt was here in MAU earlier today.  Reports U/C's are 1.5-6 minutes since she left this morning.  Denies vaginal bleeding or ROM.  Good FM.

## 2014-11-16 LAB — GC/CHLAMYDIA PROBE AMP
Chlamydia trachomatis, NAA: NEGATIVE
Neisseria gonorrhoeae by PCR: NEGATIVE

## 2014-11-18 ENCOUNTER — Inpatient Hospital Stay (HOSPITAL_COMMUNITY)
Admission: AD | Admit: 2014-11-18 | Discharge: 2014-11-21 | DRG: 775 | Disposition: A | Discharge status: Home / Self Care | Payer: Medicaid Other | Source: Ambulatory Visit | Attending: Obstetrics and Gynecology | Admitting: Obstetrics and Gynecology

## 2014-11-18 ENCOUNTER — Encounter (HOSPITAL_COMMUNITY): Payer: Self-pay | Admitting: *Deleted

## 2014-11-18 DIAGNOSIS — O9962 Diseases of the digestive system complicating childbirth: Secondary | ICD-10-CM | POA: Diagnosis present

## 2014-11-18 DIAGNOSIS — J45909 Unspecified asthma, uncomplicated: Secondary | ICD-10-CM | POA: Diagnosis present

## 2014-11-18 DIAGNOSIS — O9952 Diseases of the respiratory system complicating childbirth: Secondary | ICD-10-CM | POA: Diagnosis present

## 2014-11-18 DIAGNOSIS — Z3A38 38 weeks gestation of pregnancy: Secondary | ICD-10-CM | POA: Diagnosis present

## 2014-11-18 DIAGNOSIS — Z3483 Encounter for supervision of other normal pregnancy, third trimester: Secondary | ICD-10-CM

## 2014-11-18 DIAGNOSIS — O4292 Full-term premature rupture of membranes, unspecified as to length of time between rupture and onset of labor: Secondary | ICD-10-CM

## 2014-11-18 DIAGNOSIS — O4290 Premature rupture of membranes, unspecified as to length of time between rupture and onset of labor, unspecified weeks of gestation: Secondary | ICD-10-CM | POA: Diagnosis present

## 2014-11-18 DIAGNOSIS — Z833 Family history of diabetes mellitus: Secondary | ICD-10-CM

## 2014-11-18 DIAGNOSIS — O360131 Maternal care for anti-D [Rh] antibodies, third trimester, fetus 1: Secondary | ICD-10-CM

## 2014-11-18 DIAGNOSIS — O98811 Other maternal infectious and parasitic diseases complicating pregnancy, first trimester: Secondary | ICD-10-CM

## 2014-11-18 DIAGNOSIS — R011 Cardiac murmur, unspecified: Secondary | ICD-10-CM

## 2014-11-18 DIAGNOSIS — A749 Chlamydial infection, unspecified: Secondary | ICD-10-CM

## 2014-11-18 DIAGNOSIS — K219 Gastro-esophageal reflux disease without esophagitis: Secondary | ICD-10-CM | POA: Diagnosis present

## 2014-11-18 DIAGNOSIS — R42 Dizziness and giddiness: Secondary | ICD-10-CM

## 2014-11-18 LAB — CULTURE, BETA STREP (GROUP B ONLY): STREP GP B CULTURE: NEGATIVE

## 2014-11-18 LAB — POCT FERN TEST: POCT Fern Test: POSITIVE

## 2014-11-18 NOTE — MAU Note (Addendum)
Leaking fld since 2145. Some yellow tint to it. Some contractions. Was 4cm on Weds.

## 2014-11-19 ENCOUNTER — Inpatient Hospital Stay (HOSPITAL_COMMUNITY): Payer: Medicaid Other | Admitting: Anesthesiology

## 2014-11-19 ENCOUNTER — Encounter (HOSPITAL_COMMUNITY): Payer: Self-pay | Admitting: *Deleted

## 2014-11-19 DIAGNOSIS — O471 False labor at or after 37 completed weeks of gestation: Secondary | ICD-10-CM | POA: Diagnosis present

## 2014-11-19 DIAGNOSIS — O9962 Diseases of the digestive system complicating childbirth: Secondary | ICD-10-CM | POA: Diagnosis present

## 2014-11-19 DIAGNOSIS — O4290 Premature rupture of membranes, unspecified as to length of time between rupture and onset of labor, unspecified weeks of gestation: Secondary | ICD-10-CM | POA: Diagnosis present

## 2014-11-19 DIAGNOSIS — Z833 Family history of diabetes mellitus: Secondary | ICD-10-CM | POA: Diagnosis not present

## 2014-11-19 DIAGNOSIS — Z3A38 38 weeks gestation of pregnancy: Secondary | ICD-10-CM | POA: Diagnosis present

## 2014-11-19 DIAGNOSIS — J45909 Unspecified asthma, uncomplicated: Secondary | ICD-10-CM | POA: Diagnosis present

## 2014-11-19 DIAGNOSIS — K219 Gastro-esophageal reflux disease without esophagitis: Secondary | ICD-10-CM | POA: Diagnosis present

## 2014-11-19 DIAGNOSIS — O1490 Unspecified pre-eclampsia, unspecified trimester: Secondary | ICD-10-CM

## 2014-11-19 DIAGNOSIS — O9952 Diseases of the respiratory system complicating childbirth: Secondary | ICD-10-CM | POA: Diagnosis present

## 2014-11-19 LAB — TYPE AND SCREEN
ABO/RH(D): O NEG
ANTIBODY SCREEN: POSITIVE
DAT, IgG: NEGATIVE

## 2014-11-19 LAB — CBC
HCT: 29.9 % — ABNORMAL LOW (ref 36.0–46.0)
HEMOGLOBIN: 10.5 g/dL — AB (ref 12.0–15.0)
MCH: 31.9 pg (ref 26.0–34.0)
MCHC: 35.1 g/dL (ref 30.0–36.0)
MCV: 90.9 fL (ref 78.0–100.0)
Platelets: 206 10*3/uL (ref 150–400)
RBC: 3.29 MIL/uL — ABNORMAL LOW (ref 3.87–5.11)
RDW: 13.4 % (ref 11.5–15.5)
WBC: 6.6 10*3/uL (ref 4.0–10.5)

## 2014-11-19 MED ORDER — LIDOCAINE HCL (PF) 1 % IJ SOLN
30.0000 mL | INTRAMUSCULAR | Status: DC | PRN
Start: 2014-11-19 — End: 2014-11-19
  Filled 2014-11-19: qty 30

## 2014-11-19 MED ORDER — LACTATED RINGERS IV SOLN: 500.0000 mL | INTRAVENOUS | Status: DC | PRN

## 2014-11-19 MED ORDER — PNEUMOCOCCAL VAC POLYVALENT 25 MCG/0.5ML IJ INJ
0.5000 mL | INJECTION | INTRAMUSCULAR | Status: AC
  Administered 2014-11-20: 0.5 mL via INTRAMUSCULAR
  Filled 2014-11-19: qty 0.5

## 2014-11-19 MED ORDER — DIBUCAINE 1 % RE OINT: 1.0000 "application " | TOPICAL_OINTMENT | RECTAL | Status: DC | PRN

## 2014-11-19 MED ORDER — KETOROLAC TROMETHAMINE 30 MG/ML IJ SOLN
30.0000 mg | Freq: Four times a day (QID) | INTRAMUSCULAR | Status: DC | PRN
  Administered 2014-11-19: 30 mg via INTRAVENOUS
  Filled 2014-11-19: qty 1

## 2014-11-19 MED ORDER — IBUPROFEN 600 MG PO TABS
600.0000 mg | ORAL_TABLET | Freq: Four times a day (QID) | ORAL | Status: DC
  Administered 2014-11-19 – 2014-11-21 (×8): 600 mg via ORAL
  Filled 2014-11-19 (×9): qty 1

## 2014-11-19 MED ORDER — OXYTOCIN 40 UNITS IN LACTATED RINGERS INFUSION - SIMPLE MED
1.0000 m[IU]/min | INTRAVENOUS | Status: DC
  Administered 2014-11-19: 2 m[IU]/min via INTRAVENOUS
  Filled 2014-11-19: qty 1000

## 2014-11-19 MED ORDER — ACETAMINOPHEN 325 MG PO TABS
650.0000 mg | ORAL_TABLET | Freq: Once | ORAL | Status: AC
  Administered 2014-11-19: 650 mg via ORAL
  Filled 2014-11-19: qty 2

## 2014-11-19 MED ORDER — OXYTOCIN 40 UNITS IN LACTATED RINGERS INFUSION - SIMPLE MED
62.5000 mL/h | INTRAVENOUS | Status: DC
Start: 2014-11-19 — End: 2014-11-19

## 2014-11-19 MED ORDER — DIPHENHYDRAMINE HCL 50 MG/ML IJ SOLN: 12.5000 mg | INTRAMUSCULAR | Status: DC | PRN

## 2014-11-19 MED ORDER — LACTATED RINGERS IV SOLN
500.0000 mL | Freq: Once | INTRAVENOUS | Status: AC
  Administered 2014-11-19: 500 mL via INTRAVENOUS

## 2014-11-19 MED ORDER — ONDANSETRON HCL 4 MG/2ML IJ SOLN
4.0000 mg | Freq: Four times a day (QID) | INTRAMUSCULAR | Status: DC | PRN
  Administered 2014-11-19: 4 mg via INTRAVENOUS
  Filled 2014-11-19: qty 2

## 2014-11-19 MED ORDER — FENTANYL 2.5 MCG/ML BUPIVACAINE 1/10 % EPIDURAL INFUSION (WH - ANES)
14.0000 mL/h | INTRAMUSCULAR | Status: DC | PRN
  Filled 2014-11-19 (×2): qty 125

## 2014-11-19 MED ORDER — FENTANYL 2.5 MCG/ML BUPIVACAINE 1/10 % EPIDURAL INFUSION (WH - ANES)
INTRAMUSCULAR | Status: DC | PRN
  Administered 2014-11-19: 06:00:00
  Administered 2014-11-19: 14 mL/h via EPIDURAL

## 2014-11-19 MED ORDER — OXYCODONE-ACETAMINOPHEN 5-325 MG PO TABS
1.0000 | ORAL_TABLET | Freq: Four times a day (QID) | ORAL | Status: DC | PRN
  Administered 2014-11-19 – 2014-11-21 (×6): 2 via ORAL
  Filled 2014-11-19 (×6): qty 2

## 2014-11-19 MED ORDER — LANOLIN HYDROUS EX OINT: TOPICAL_OINTMENT | CUTANEOUS | Status: DC | PRN

## 2014-11-19 MED ORDER — CITRIC ACID-SODIUM CITRATE 334-500 MG/5ML PO SOLN: 30.0000 mL | ORAL | Status: DC | PRN

## 2014-11-19 MED ORDER — LIDOCAINE HCL (PF) 1 % IJ SOLN
INTRAMUSCULAR | Status: DC | PRN
  Administered 2014-11-19: 5 mL
  Administered 2014-11-19: 3 mL
  Administered 2014-11-19: 5 mL

## 2014-11-19 MED ORDER — LACTATED RINGERS IV SOLN
INTRAVENOUS | Status: DC
  Administered 2014-11-19 (×2): via INTRAVENOUS

## 2014-11-19 MED ORDER — PRENATAL MULTIVITAMIN CH
1.0000 | ORAL_TABLET | Freq: Every day | ORAL | Status: DC
  Administered 2014-11-20 – 2014-11-21 (×2): 1 via ORAL
  Filled 2014-11-19 (×2): qty 1

## 2014-11-19 MED ORDER — SIMETHICONE 80 MG PO CHEW: 80.0000 mg | CHEWABLE_TABLET | ORAL | Status: DC | PRN

## 2014-11-19 MED ORDER — DIPHENHYDRAMINE HCL 25 MG PO CAPS: 25.0000 mg | ORAL_CAPSULE | Freq: Four times a day (QID) | ORAL | Status: DC | PRN

## 2014-11-19 MED ORDER — INFLUENZA VAC SPLIT QUAD 0.5 ML IM SUSY
0.5000 mL | PREFILLED_SYRINGE | INTRAMUSCULAR | Status: AC
  Administered 2014-11-20: 0.5 mL via INTRAMUSCULAR
  Filled 2014-11-19: qty 0.5

## 2014-11-19 MED ORDER — TETANUS-DIPHTH-ACELL PERTUSSIS 5-2.5-18.5 LF-MCG/0.5 IM SUSP
0.5000 mL | Freq: Once | INTRAMUSCULAR | Status: AC
  Administered 2014-11-20: 0.5 mL via INTRAMUSCULAR
  Filled 2014-11-19: qty 0.5

## 2014-11-19 MED ORDER — BENZOCAINE-MENTHOL 20-0.5 % EX AERO
1.0000 | INHALATION_SPRAY | CUTANEOUS | Status: DC | PRN
Start: 2014-11-19 — End: 2014-11-21
  Administered 2014-11-19 – 2014-11-21 (×2): 1 via TOPICAL
  Filled 2014-11-19 (×2): qty 56

## 2014-11-19 MED ORDER — OXYCODONE-ACETAMINOPHEN 5-325 MG PO TABS: 2.0000 | ORAL_TABLET | ORAL | Status: DC | PRN

## 2014-11-19 MED ORDER — PHENYLEPHRINE 40 MCG/ML (10ML) SYRINGE FOR IV PUSH (FOR BLOOD PRESSURE SUPPORT)
80.0000 ug | PREFILLED_SYRINGE | INTRAVENOUS | Status: DC | PRN
  Filled 2014-11-19: qty 2

## 2014-11-19 MED ORDER — WITCH HAZEL-GLYCERIN EX PADS: 1.0000 "application " | MEDICATED_PAD | CUTANEOUS | Status: DC | PRN

## 2014-11-19 MED ORDER — ZOLPIDEM TARTRATE 5 MG PO TABS
5.0000 mg | ORAL_TABLET | Freq: Every evening | ORAL | Status: DC | PRN
Start: 2014-11-19 — End: 2014-11-21

## 2014-11-19 MED ORDER — ONDANSETRON HCL 4 MG PO TABS: 4.0000 mg | ORAL_TABLET | ORAL | Status: DC | PRN

## 2014-11-19 MED ORDER — EPHEDRINE 5 MG/ML INJ
10.0000 mg | INTRAVENOUS | Status: DC | PRN
  Filled 2014-11-19: qty 2

## 2014-11-19 MED ORDER — ACETAMINOPHEN 325 MG PO TABS: 650.0000 mg | ORAL_TABLET | ORAL | Status: DC | PRN

## 2014-11-19 MED ORDER — OXYCODONE-ACETAMINOPHEN 5-325 MG PO TABS: 1.0000 | ORAL_TABLET | ORAL | Status: DC | PRN

## 2014-11-19 MED ORDER — TERBUTALINE SULFATE 1 MG/ML IJ SOLN
0.2500 mg | Freq: Once | INTRAMUSCULAR | Status: DC | PRN
  Filled 2014-11-19: qty 1

## 2014-11-19 MED ORDER — SENNOSIDES-DOCUSATE SODIUM 8.6-50 MG PO TABS
2.0000 | ORAL_TABLET | ORAL | Status: DC
  Administered 2014-11-20 (×2): 2 via ORAL
  Filled 2014-11-19 (×2): qty 2

## 2014-11-19 MED ORDER — ONDANSETRON HCL 4 MG/2ML IJ SOLN: 4.0000 mg | INTRAMUSCULAR | Status: DC | PRN

## 2014-11-19 MED ORDER — OXYTOCIN 40 UNITS IN LACTATED RINGERS INFUSION - SIMPLE MED: 62.5000 mL/h | INTRAVENOUS | Status: DC | PRN

## 2014-11-19 MED ORDER — PHENYLEPHRINE 40 MCG/ML (10ML) SYRINGE FOR IV PUSH (FOR BLOOD PRESSURE SUPPORT)
80.0000 ug | PREFILLED_SYRINGE | INTRAVENOUS | Status: DC | PRN
  Filled 2014-11-19: qty 20
  Filled 2014-11-19: qty 2

## 2014-11-19 MED ORDER — OXYTOCIN BOLUS FROM INFUSION: 500.0000 mL | INTRAVENOUS | Status: DC

## 2014-11-19 NOTE — Anesthesia Preprocedure Evaluation (Signed)
Anesthesia Evaluation  Patient identified by MRN, date of birth, ID band Patient awake    Reviewed: Allergy & Precautions, H&P , NPO status , Patient's Chart, lab work & pertinent test results  History of Anesthesia Complications Negative for: history of anesthetic complications  Airway Mallampati: II  TM Distance: >3 FB Neck ROM: full    Dental no notable dental hx. (+) Teeth Intact   Pulmonary asthma ,  breath sounds clear to auscultation  Pulmonary exam normal       Cardiovascular negative cardio ROS  Rhythm:regular Rate:Normal     Neuro/Psych negative neurological ROS  negative psych ROS   GI/Hepatic negative GI ROS, Neg liver ROS,   Endo/Other  negative endocrine ROS  Renal/GU negative Renal ROS  negative genitourinary   Musculoskeletal   Abdominal Normal abdominal exam  (+)   Peds  Hematology negative hematology ROS (+)   Anesthesia Other Findings   Reproductive/Obstetrics (+) Pregnancy                             Anesthesia Physical Anesthesia Plan  ASA: II  Anesthesia Plan: Epidural   Post-op Pain Management:    Induction:   Airway Management Planned:   Additional Equipment:   Intra-op Plan:   Post-operative Plan:   Informed Consent: I have reviewed the patients History and Physical, chart, labs and discussed the procedure including the risks, benefits and alternatives for the proposed anesthesia with the patient or authorized representative who has indicated his/her understanding and acceptance.     Plan Discussed with:   Anesthesia Plan Comments:         Anesthesia Quick Evaluation

## 2014-11-19 NOTE — Progress Notes (Signed)
Patient ID: Monica Cox, female   DOB: 26-Feb-1991, 26 y.o.   MRN: [id number] Monica Cox is a 24 y.o. G3P1011 at 3346w0d admitted for active labor, rupture of membranes  Subjective: Comfortable w/ epidural, no complaints  Objective: BP 108/62 mmHg  Pulse 78  Temp(Src) 98.3 F (36.8 C) (Oral)  Resp 18  Ht 5' 3.5" (1.613 m)  Wt 90.992 kg (200 lb 9.6 oz)  BMI 34.97 kg/m2  LMP 02/26/2014    FHT:  FHR: 115 bpm, variability: moderate,  accelerations:  Present,  decelerations:  Present earlies & occ variables UC:   q 1-674mins  SVE:   Dilation: 6 Effacement (%): 100 Station: 0 Exam by:: Monica Cox, SNM  Labs: Lab Results  Component Value Date   WBC 6.6 11/19/2014   HGB 10.5* 11/19/2014   HCT 29.9* 11/19/2014   MCV 90.9 11/19/2014   PLT 206 11/19/2014    Assessment / Plan: spontaneous labor, now w/o change since last exam, will start pitocin per protocol for augmentation as needed  Labor: now on pitocin Fetal Wellbeing:  Category II Pain Control:  Epidural Pre-eclampsia: n/a I/D:  n/a Anticipated MOD:  NSVD  [name], [name] CNM, WHNP-BC 11/19/2014, 8:14 AM

## 2014-11-19 NOTE — Progress Notes (Signed)
Monica Cox is a 24 y.o. G3P1011 at 5793w0d admitted for rupture of membranes  Subjective: More comfortable with epidural.  Objective: BP 117/69 mmHg  Pulse 53  Temp(Src) 98.4 F (36.9 C) (Oral)  Resp 20  Ht 5' 3.5" (1.613 m)  Wt 90.992 kg (200 lb 9.6 oz)  BMI 34.97 kg/m2  LMP 02/26/2014    FHT:  FHR: 120 bpm, variability: moderate,  accelerations:  Present,  decelerations:  Present variables UC:   regular, every 2-3 minutes  SVE:   Dilation: 4.5 Effacement (%): 100 Station: -2 Exam by:: Monica Cox, SNM   Labs: Lab Results  Component Value Date   WBC 6.6 11/19/2014   HGB 10.5* 11/19/2014   HCT 29.9* 11/19/2014   MCV 90.9 11/19/2014   PLT 206 11/19/2014    Assessment / Plan: Spontaneous labor, progressing normally  Labor: Progressing normally Fetal Wellbeing:  Category II Pain Control:  Epidural Pre-eclampsia: no s/s I/D:  GBS negative Anticipated MOD:  NSVD  Monica Cox SNM 11/19/2014, 3:46 AM

## 2014-11-19 NOTE — Anesthesia Procedure Notes (Signed)
Epidural Patient location during procedure: OB  Staffing Anesthesiologist: Yeshua Stryker Performed by: anesthesiologist   Preanesthetic Checklist Completed: patient identified, site marked, surgical consent, pre-op evaluation, timeout performed, IV checked, risks and benefits discussed and monitors and equipment checked  Epidural Patient position: sitting Prep: ChloraPrep Patient monitoring: heart rate, continuous pulse ox and blood pressure Approach: right paramedian Location: L3-L4 Injection technique: LOR saline  Needle:  Needle type: Tuohy  Needle gauge: 17 G Needle length: 9 cm and 9 Needle insertion depth: 5 cm Catheter type: closed end flexible Catheter size: 20 Guage Catheter at skin depth: 10 cm Test dose: negative  Assessment Events: blood not aspirated, injection not painful, no injection resistance, negative IV test and no paresthesia  Additional Notes   Patient tolerated the insertion well without complications.   

## 2014-11-19 NOTE — Anesthesia Postprocedure Evaluation (Signed)
  Anesthesia Post-op Note  Patient: Monica Cox  Procedure(s) Performed: * No procedures listed *  Patient Location: Mother/Baby  Anesthesia Type:Epidural  Level of Consciousness: awake  Airway and Oxygen Therapy: Patient Spontanous Breathing  Post-op Pain: mild  Post-op Assessment: Patient's Cardiovascular Status Stable and Respiratory Function Stable  Post-op Vital Signs: stable  Last Vitals:  Filed Vitals:   11/19/14 1425  BP: 111/65  Pulse: 75  Temp: 37.3 C  Resp: 18    Complications: No apparent anesthesia complications

## 2014-11-19 NOTE — Progress Notes (Signed)
Called [facility] to transfer pt - asked to wait until admitted other pt - the rn will call when ready

## 2014-11-19 NOTE — H&P (Signed)
Monica Cox is a 24 y.o. G35P1011 female at [redacted]w[redacted]d by LMP and 7 week u/s presenting with srom at 2145.   Reports active fetal movement, contractions: occasional/mild, vaginal bleeding: none, membranes: ruptured. Initiated prenatal care at [facility] at 7 wks.   Most recent u/s10/06/2014, normal female. Pregnancy complicated by chlamydia infection 1st trimester, trichomonas 2nd trimester, and vertigo.  H/O asthma, reflux  Past Medical History: Past Medical History  Diagnosis Date  . Asthma   . Reflux   . Pregnant   . Vaginal Pap smear, abnormal   . Anemia   . Trichomonas infection 08-08-2014    Past Surgical History: Past Surgical History  Procedure Laterality Date  . None    . Esophagogastroduodenoscopy endoscopy    . Dilation and curettage of uterus      MAB Dr. Despina Hidden performed    Obstetrical History: OB History    Gravida Para Term Preterm AB TAB SAB Ectopic Multiple Living   0 1 0 1 0 0 1      Social History: History   Social History  . Marital Status: Single    Spouse Name: N/A  . Number of Children: 1  . Years of Education: N/A   Occupational History  . [employer]     respiratory therapy   Social History Main Topics  . Smoking status: Never Smoker   . Smokeless tobacco: Never Used  . Alcohol Use: No     Comment: occasionally  . Drug Use: No  . Sexual Activity: Not Currently    Birth Control/ Protection: None   Other Topics Concern  . None   Social History Narrative    Family History: Family History  Problem Relation Age of Onset  . Ulcers Father   . Ulcers Paternal Grandmother   . Diabetes Paternal Grandmother   . Ulcers Paternal Aunt   . Liver disease Neg Hx   . Colon cancer Neg Hx     Allergies: No Known Allergies  Prescriptions prior to admission  Medication Sig Dispense Refill Last Dose  . meclizine (ANTIVERT) 32 MG tablet Take 1 tablet (32 mg total) by mouth 3 (three) times daily as needed. 30 tablet 0 Past Week at Unknown time   . pantoprazole (PROTONIX) 20 MG tablet Take 1 tablet (20 mg total) by mouth daily. 30 tablet 6 11/18/2014 at Unknown time  . Prenatal Multivit-Min-Fe-FA (PRENATAL VITAMINS) 0.8 MG tablet Take 1 tablet by mouth daily. 30 tablet 3 11/18/2014 at Unknown time  . acetaminophen (TYLENOL) 325 MG tablet Take 650 mg by mouth every 6 (six) hours as needed (pain).    Unknown at Unknown time  . albuterol (PROVENTIL HFA;VENTOLIN HFA) 108 (90 BASE) MCG/ACT inhaler Inhale 2 puffs into the lungs every 6 (six) hours as needed for wheezing or shortness of breath.   More than a month at Unknown time     Review of Systems  Pertinent pos/neg as indicated in HPI    Blood pressure 129/77, pulse 79, temperature 98.3 F (36.8 C), temperature source Oral, resp. rate 20, height 5' 3.5" (1.613 m), weight 90.992 kg (200 lb 9.6 oz), last menstrual period 02/26/2014. General appearance: alert, cooperative and no distress Lungs: clear to auscultation bilaterally Heart: regular rate and rhythm Abdomen: gravid, soft, non-tender Extremities: no edema  Fetal monitoring: FHR: 125 bpm, variability: moderate,  Accelerations: Present,  decelerations:  Absent Uterine activity: mild/irregular, per patient  Dilation: 4.5 Effacement (%): 80 Station: -1 Exam by:: K.Wilson,RN Presentation: cephalic  Prenatal labs: ABO, Rh: O/NEG/-- (07/22 1450) Antibody: NEG (12/01 0919) Rubella:   RPR: NON REAC (12/01 0919)  HBsAg: NEGATIVE (07/22 1450)  HIV: NONREACTIVE (12/01 0919)  GBS: Negative (02/10 0000)   2 hr Glucola: 67/108/114 Genetic screening:  neg Anatomy US: normal female  Results for orders placed or performed during the hospital encounter of 11/18/14 (from the past 24 hour(s))  Fern Test   Collection Time: 11/18/14 11:56 PM  Result Value Ref Range   POCT Fern Test Positive = ruptured amniotic membanes      Assessment:  8139w0d SIUP  G3P1011  SROM, early labor  Cat 1 FHR  GBS Negative (02/10  0000)  Plan:  Admit to BS  IV pain meds/epidural prn active labor  Will re-check in 2-3 hours & augment labor as needed  Anticipate NSVD   Plans to breast/bottle feed  Contraception: unsure  Circumcision: n/a  Ross,Lisa Wynne SNM 11/19/2014, 12:45 AM   I spoke with and examined patient and agree with resident/PA/SNM's note and plan of care.  Cheral MarkerKimberly R. Mindel Friscia, CNM, [facility]NP-BC 11/19/2014 6:56 AM

## 2014-11-19 NOTE — Progress Notes (Signed)
Monica PeckBriana N Cox is a 24 y.o. G3P1011 at 1723w0d admitted for spontaneous rupture of membranes. Subjective: Comfortable with epidural.  Objective: BP 112/49 mmHg  Pulse 81  Temp(Src) 98.6 F (37 C) (Oral)  Resp 20  Ht 5' 3.5" (1.613 m)  Wt 90.992 kg (200 lb 9.6 oz)  BMI 34.97 kg/m2  LMP 02/26/2014    FHT:  FHR: 115 bpm, variability: moderate,  accelerations:  Abscent,  decelerations:  Present early UC:   regular, every 3 minutes  SVE:   Dilation: 6 Effacement (%): 100 Station: 0 Exam by:: Monica StanleyLisa, SNM  Labs: Lab Results  Component Value Date   WBC 6.6 11/19/2014   HGB 10.5* 11/19/2014   HCT 29.9* 11/19/2014   MCV 90.9 11/19/2014   PLT 206 11/19/2014    Assessment / Plan: Spontaneous labor, progressing normally  Labor: Progressing normally Fetal Wellbeing:  Category I Pain Control:  Epidural Pre-eclampsia: no s/s I/D:  GBS negative Anticipated MOD:  NSVD  Monica Cox SNM 11/19/2014, 6:14 AM

## 2014-11-20 LAB — RPR: RPR: NONREACTIVE

## 2014-11-20 LAB — HIV ANTIBODY (ROUTINE TESTING W REFLEX): HIV SCREEN 4TH GENERATION: NONREACTIVE

## 2014-11-20 MED ORDER — IBUPROFEN 600 MG PO TABS: 600.0000 mg | ORAL_TABLET | Freq: Four times a day (QID) | ORAL | Status: DC | PRN

## 2014-11-20 MED ORDER — SERTRALINE HCL 50 MG PO TABS: 50.0000 mg | ORAL_TABLET | Freq: Every day | ORAL | Status: DC

## 2014-11-20 MED ORDER — RHO D IMMUNE GLOBULIN 1500 UNIT/2ML IJ SOSY
300.0000 ug | PREFILLED_SYRINGE | Freq: Once | INTRAMUSCULAR | Status: AC
  Administered 2014-11-20: 300 ug via INTRAVENOUS
  Filled 2014-11-20: qty 2

## 2014-11-20 NOTE — Progress Notes (Signed)
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 rate 18, height 5' 3.5" (1.613 m), weight 90.992 kg (200 lb 9.6 oz), last menstrual period 02/26/2014, SpO2 100 %, unknown if currently breastfeeding.  Physical Exam:  General: alert, cooperative, appears stated age, no distress and normal mood Lochia: appropriate Uterine Fundus: firm Incision: NA DVT Evaluation: Negative Homan's sign.   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 Not likely to become so severe or last as long if Tx'd w/ counseling, meds. Pt agreeable to counseling referral. Uncertain about antidepressant Rx. Worried that it could be used against her if [facility]OB tried to get custody. Reassured her that receiving care for and treating PPD shows evidence that she is taking care of herself and trying to be a good mother. Pt will accept Rx and decide if she needs to start. Informed her that it takes 4-6 weeks to take full effect.    LOS: 1 day   Dorathy KinsmanSMITH, Rosabelle Jupin 11/20/2014, 9:29 AM

## 2014-11-20 NOTE — Lactation Note (Signed)
This note was copied from the chart of Monica Cox. Lactation Consultation Note  Follow up visit made to assist with latch.  Positioned baby in football hold on right breast.  Mom knows how to do hand expression prior to feeding.  One drop visible.  Mom has soft compressible areola.  Baby latched well after a few attempts and nursed actively.  Reviewed basics and answered questions.  Encouraged to call for assist/concerns prn.  Patient Name: Monica Aviva KluverBriana Cox ZOXWR'UToday's Date: 11/20/2014 Reason for consult: Follow-up assessment   Maternal Data    Feeding Feeding Type: Breast Fed Length of feed: 15 min  LATCH Score/Interventions Latch: Grasps breast easily, tongue down, lips flanged, rhythmical sucking. Intervention(s): Adjust position;Assist with latch;Breast massage;Breast compression  Audible Swallowing: A few with stimulation Intervention(s): Hand expression;Alternate breast massage  Type of Nipple: Everted at rest and after stimulation  Comfort (Breast/Nipple): Soft / non-tender     Hold (Positioning): Assistance needed to correctly position infant at breast and maintain latch. Intervention(s): Breastfeeding basics reviewed;Support Pillows;Position options  LATCH Score: 8  Lactation Tools Discussed/Used     Consult Status Consult Status: Follow-up Date: 11/21/14 Follow-up type: In-patient    Huston FoleyMOULDEN, Kiaya [place] S 11/20/2014, 1:58 PM

## 2014-11-20 NOTE — Lactation Note (Signed)
This note was copied from the chart of Monica Cox. Lactation Consultation Note  Baby recently bf for one hour.  Mother has been shown hand expression. Suggest mother take her underwire bra off and wear a nursing bra w.o wires to avoid plugged ducts. Reviewed cluster feeding, STS, deep latch instructions and massage breast during feeding. Mom encouraged to feed baby 8-12 times/24 hours and with feeding cues.  Mom made aware of O/P services, breastfeeding support groups, community resources, and our phone # for post-discharge questions.      Patient Name: Monica Aviva KluverBriana Cox ZOXWR'UToday's Date: 11/20/2014 Reason for consult: Initial assessment   Maternal Data    Feeding    LATCH Score/Interventions                      Lactation Tools Discussed/Used     Consult Status Consult Status: Follow-up Date: 11/21/14 Follow-up type: In-patient    Dahlia ByesBerkelhammer, Ruth [facility] 11/20/2014, 9:15 AM

## 2014-11-20 NOTE — Progress Notes (Signed)
UR chart review completed.  

## 2014-11-20 NOTE — Lactation Note (Signed)
This note was copied from the chart of Monica Cox. Lactation Consultation Note Follow up visit at 35 hours.  Mom called requesting assist with feeding, but baby just needed to burp.  Baby is getting breast and formula in bottles, but mom reports she really wants her milk to come in.  Discussed supply and demand with breast and formula feeding.  Last feeding was 3 hours ago, encouraged mom to wake baby and attempt feeding now with assist, mom agreed.  Baby latched to right breast in football hold with assist to compress breast and latch at the right time.  Mom complains of pain and with repositioning was able to make breastfeeding more comfortable for her.  Observed about 5 minutes of feeding and discussed keeping baby latched deeply. Mom to call for assist as needed.     Patient Name: Monica Cox KGMWN'UToday's Date: 11/20/2014 Reason for consult: Follow-up assessment;Breast/nipple pain;Difficult latch   Maternal Data    Feeding Feeding Type: Breast Fed Length of feed:  (observed 5 minutes)  LATCH Score/Interventions Latch: Repeated attempts needed to sustain latch, nipple held in mouth throughout feeding, stimulation needed to elicit sucking reflex. Intervention(s): Adjust position;Assist with latch;Breast massage;Breast compression  Audible Swallowing: A few with stimulation Intervention(s): Skin to skin;Hand expression  Type of Nipple: Everted at rest and after stimulation  Comfort (Breast/Nipple): Filling, red/small blisters or bruises, mild/mod discomfort  Problem noted: Mild/Moderate discomfort Interventions (Mild/moderate discomfort): Hand expression;Comfort gels  Hold (Positioning): Assistance needed to correctly position infant at breast and maintain latch. Intervention(s): Breastfeeding basics reviewed;Support Pillows;Position options;Skin to skin  LATCH Score: 6  Lactation Tools Discussed/Used     Consult Status Consult Status: Follow-up Date:  11/21/14 Follow-up type: In-patient    Beverely RisenShoptaw, Arvella MerlesJana Lynn 11/20/2014, 10:31 PM

## 2014-11-20 NOTE — Progress Notes (Signed)
Clinical Social Work Department PSYCHOSOCIAL ASSESSMENT - MATERNAL/CHILD 22-Aug-2015  Patient:  Monica Cox, Monica Cox  Account Number:  [id number]  Admit Date:  September 14, 2015  Ardine Eng Name:   Nicanor Alcon   Clinical Social Worker:  Lucita Ferrara, CLINICAL SOCIAL WORKER   Date/Time:  2014/11/02 09:45 AM  Date Referred:  02/23/2015   Referral source  [facility]     Referred reason  Depression/Anxiety   Other referral source:    I:  FAMILY / HOME ENVIRONMENT Child's legal guardian:  PARENT  Guardian - Name Guardian - Age Guardian - Address  Monica Cox [place], [place] [address]   Other household support members/support persons Name Relationship DOB  Carmin [place] 2010   Other support:   [name] reported that her parents are supportive.  She discussed spending 4-6 weeks in their home in order to provide her with additional support as she transitions into the postpartum period.    II  PSYCHOSOCIAL DATA Information Source:  Family Interview  Financial and [employer] Employment:   [name] did not identify/disclose employment status.   Financial resources:  Medicaid If Medicaid - County:  [employer] Other  [place] / Grade:  N/A Music therapist / Child Services Coordination / Early Interventions:   None reported  Cultural issues impacting care:   None reported    III  STRENGTHS Strengths  Adequate Resources  Home prepared for Child (including basic supplies)  Supportive family/friends   Strength comment:  [name] is able to openly engage in conversations regarding her mental health.   IV  RISK FACTORS AND CURRENT PROBLEMS Current Problem:  YES   Risk Factor & Current Problem Patient Issue Family Issue Risk Factor / Current Problem Comment  Mental Illness Y N [name] presents with history of postpartum depression. She endorsed symptoms of depression during the pregnancy (from 3-7 months).    V  SOCIAL WORK ASSESSMENT CSW met with  the [name] due to maternal symptoms of depression during the pregnancy.  [name] presented as agreeable and receptive to the visit.  She was easily engaged and openly discussed her mental health.  [name] was noted to display a full range in affect, presented in a pleasant mood, and was observed to be interacting/bonding with the infant.  She presents with insight and self-awareness related to her mental health needs, and was receptive to exploring how to best care for her mental health as she transitions into the postpartum period.  Of note: [name] provided consent for the MGM to be present during the assessment.   CSW assisted the [name] process her thoughts and feelings as she transitions into the postpartum period.  She smiled as she reflected upon the birthing process, and discussed that it was much "better" than the experience with her first child.  She discussed that it has been an ideal experience and feels "good".  Without much prompting, she did discuss that she does feel overwhelmed as she transitions to parenting two children since she wants to "do what is best" for her children and is concerned about her ability to ensure that she is providing enough attention to both of her children.  CSW validated and normalized her feelings, and [name] presented with insight and awareness that she is currently a "good mother" despite feeling torn and conflicted on how to best divide her attention.  [name] presents with strong desire to be the "best mother" and to do "what is best", but she also recognizes  the role of self-care in order to help her to reach these goals.  She discussed that she has already noted 1-2 episodes of "feeling overwhelmed" at the hospital, but shared that she had the ability and insight to hand the infant to the [facility] in order to provide her with an opportunity to take a few deep breaths and relax.  She was unable to identify a "theme" for events that led to her feeling overwhelmed, but she recognized how her  anxiety has led to her sleeping less and caused her to become more emotional.  She shared that she needs to allow her support system to help, but she also discussed that she does not want to be seen as a "burden".  CSW acknowledged and validated her feelings, and upon further exploration, [name] acknowledged need to let them help her so she can have more intentional time with her children since she will be more rested.  She identified increasing sleep as a goal in the next few days.    The [name] and MGM expressed concern about postpartum depression since the [name] had PPD after her first child was born and she experienced symptoms of depression during the pregnancy.  The [name] and MGM reviewed symptoms that occurred during the pregnancy (feeling detached/numb, decreased motivation, desire to isolate, not responding to phone calls), and acknowledged that these symptoms are congruent with PPD. They acknowledged that the [name] has increased risk for developing symptoms given her history of PPD and her symptoms during the pregnancy.  [name] and MGM presented as easily engaged and receptive to exploring how to best "treat" symptoms.  [name] acknowledged importance of sleep hygiene, healthy foods, and appropriate physical activity.  She also presented with awareness of need to reach out to others if she feels like isolating since isolating may cause symptoms to worsen.  CSW also discussed potential treatment with therapy and medication management. [name] acknowledged rx for medication during the pregnancy, but she shared that she never started the medication since she feared that it would make her "look bad" if she were ever engaged in a custody battle.  She shared that due to this belief, she felt depressed for multiple months during the pregnancy, and noted how it negatively impacted her other child (was more irritable, exhibited less patience).    The [name] presents with insight as she is able to reflect upon negative outcomes of  untreated mental health during the pregnancy, and what may occur in the future if she continues to experience symptoms.  She shared that she is contemplating starting an antidepressant, but is concerned how it may impact her milk production and the potential side effects on the infant.  CSW validated her concerns, and encouraged her to discuss with the [facility] about medications while breastfeeding.  CSW continued to assist the [name] explore positive and negative aspects of starting the medication and not starting the medication.  CSW also explored with the [name] how value exploration may assist her to make a decision.  [name] shared that she is motivated to be the best mother, and discussed a goal of her children being happy.  She shared that she is more strongly considering an antidepressant since she will want to mentally feel good in the postpartum period. [name] presented with awareness of need to start a medication prior to escalation of symptoms since it takes time to reach full therapeutic benefit of medications.  Per [name], her medical provider has already provided her with an rx, and she  just needs to decide whether or not she chooses to start it.   [name] expressed appreciation for the visit, and denied additional questions, concerns, or needs.  She shared belief that the visit had been helpful for her as she attempts to figure out how to best proceed in regards to her mental health as she transitions into the postpartum period.   No barriers to discharge.  VI SOCIAL WORK PLAN Social Work Secretary/administrator Education  Information/Referral to [employer]  No Further Intervention Required / No Barriers to Discharge   Type of pt/family education:   Postpartum depression   If child protective services report - county:  N/A If child protective services report - date:  N/A Information/referral to community resources comment:   CSW provided [name] with list of outpatient mental health referrals in  [place].  [name] has received a rx for medication, and was receptive to exploring the positive and negative components of starting and not starting the medication.   Other social work plan:   CSW to follow up as needed or upon [name] request.

## 2014-11-20 NOTE — Discharge Instructions (Signed)
Postpartum Care After Vaginal Delivery °After you deliver your newborn (postpartum period), the usual stay in the hospital is 24-72 hours. If there were problems with your labor or delivery, or if you have other medical problems, you might be in the hospital longer.  °While you are in the hospital, you will receive help and instructions on how to care for yourself and your newborn during the postpartum period.  °While you are in the hospital: °· Be sure to tell your nurses if you have pain or discomfort, as well as where you feel the pain and what makes the pain worse. °· If you had an incision made near your vagina (episiotomy) or if you had some tearing during delivery, the nurses may put ice packs on your episiotomy or tear. The ice packs may help to reduce the pain and swelling. °· If you are breastfeeding, you may feel uncomfortable contractions of your uterus for a couple of weeks. This is normal. The contractions help your uterus get back to normal size. °· It is normal to have some bleeding after delivery. °· For the first 1-3 days after delivery, the flow is red and the amount may be similar to a period. °· It is common for the flow to start and stop. °· In the first few days, you may pass some small clots. Let your nurses know if you begin to pass large clots or your flow increases. °· Do not  flush blood clots down the toilet before having the nurse look at them. °· During the next 3-10 days after delivery, your flow should become more watery and pink or brown-tinged in color. °· Ten to fourteen days after delivery, your flow should be a small amount of yellowish-white discharge. °· The amount of your flow will decrease over the first few weeks after delivery. Your flow may stop in 6-8 weeks. Most women have had their flow stop by 12 weeks after delivery. °· You should change your sanitary pads frequently. °· Wash your hands thoroughly with soap and water for at least 20 seconds after changing pads, using  the toilet, or before holding or feeding your newborn. °· You should feel like you need to empty your bladder within the first 6-8 hours after delivery. °· In case you become weak, lightheaded, or faint, call your nurse before you get out of bed for the first time and before you take a shower for the first time. °· Within the first few days after delivery, your breasts may begin to feel tender and full. This is called engorgement. Breast tenderness usually goes away within 48-72 hours after engorgement occurs. You may also notice milk leaking from your breasts. If you are not breastfeeding, do not stimulate your breasts. Breast stimulation can make your breasts produce more milk. °· Spending as much time as possible with your newborn is very important. During this time, you and your newborn can feel close and get to know each other. Having your newborn stay in your room (rooming in) will help to strengthen the bond with your newborn.  It will give you time to get to know your newborn and become comfortable caring for your newborn. °· Your hormones change after delivery. Sometimes the hormone changes can temporarily cause you to feel sad or tearful. These feelings should not last more than a few days. If these feelings last longer than that, you should talk to your caregiver. °· If desired, talk to your caregiver about methods of family planning or contraception. °·   Talk to your caregiver about immunizations. Your caregiver may want you to have the following immunizations before leaving the hospital:  Tetanus, diphtheria, and pertussis (Tdap) or tetanus and diphtheria (Td) immunization. It is very important that you and your family (including grandparents) or others caring for your newborn are up-to-date with the Tdap or Td immunizations. The Tdap or Td immunization can help protect your newborn from getting ill.  Rubella immunization.  Varicella (chickenpox) immunization.  Influenza immunization. You should  receive this annual immunization if you did not receive the immunization during your pregnancy. Document Released: 07/20/2007 Document Revised: 06/16/2012 Document Reviewed: 05/19/2012 [facility] Patient Information 2015 [place], [place]. This information is not intended to replace advice given to you by your health care provider. Make sure you discuss any questions you have with your health care provider.   Intrauterine Device Information An intrauterine device (IUD) is inserted into your uterus to prevent pregnancy. There are two types of IUDs available:   Copper IUD--This type of IUD is wrapped in copper wire and is placed inside the uterus. Copper makes the uterus and fallopian tubes produce a fluid that kills sperm. The copper IUD can stay in place for 10 years.  Hormone IUD--This type of IUD contains the hormone progestin (synthetic progesterone). The hormone thickens the cervical mucus and prevents sperm from entering the uterus. It also thins the uterine lining to prevent implantation of a fertilized egg. The hormone can weaken or kill the sperm that get into the uterus. One type of hormone IUD can stay in place for 5 years, and another type can stay in place for 3 years. Your health care provider will make sure you are a good candidate for a contraceptive IUD. Discuss with your health care provider the possible side effects.  ADVANTAGES OF AN INTRAUTERINE DEVICE  IUDs are highly effective, reversible, long acting, and low maintenance.   There are no estrogen-related side effects.   An IUD can be used when breastfeeding.   IUDs are not associated with weight gain.   The copper IUD works immediately after insertion.   The hormone IUD works right away if inserted within 7 days of your period starting. You will need to use a backup method of birth control for 7 days if the hormone IUD is inserted at any other time in your cycle.  The copper IUD does not interfere with your female  hormones.   The hormone IUD can make heavy menstrual periods lighter and decrease cramping.   The hormone IUD can be used for 3 or 5 years.   The copper IUD can be used for 10 years. DISADVANTAGES OF AN INTRAUTERINE DEVICE  The hormone IUD can be associated with irregular bleeding patterns.   The copper IUD can make your menstrual flow heavier and more painful.   You may experience cramping and vaginal bleeding after insertion.  Document Released: 08/26/2004 Document Revised: 05/25/2013 Document Reviewed: 03/13/2013 [facility] Patient Information 2015 [place], [place]. This information is not intended to replace advice given to you by your health care provider. Make sure you discuss any questions you have with your health care provider.  Postpartum Depression and Baby Blues The postpartum period begins right after the birth of a baby. During this time, there is often a great amount of joy and excitement. It is also a time of many changes in the life of the parents. Regardless of how many times a mother gives birth, each child brings new challenges and dynamics to the family.  It is not unusual to have feelings of excitement along with confusing shifts in moods, emotions, and thoughts. All mothers are at risk of developing postpartum depression or the "baby blues." These mood changes can occur right after giving birth, or they may occur many months after giving birth. The baby blues or postpartum depression can be mild or severe. Additionally, postpartum depression can go away rather quickly, or it can be a long-term condition.  CAUSES Raised hormone levels and the rapid drop in those levels are thought to be a main cause of postpartum depression and the baby blues. A number of hormones change during and after pregnancy. Estrogen and progesterone usually decrease right after the delivery of your baby. The levels of thyroid hormone and various cortisol steroids also rapidly drop. Other factors  that play a role in these mood changes include major life events and genetics.  RISK FACTORS If you have any of the following risks for the baby blues or postpartum depression, know what symptoms to watch out for during the postpartum period. Risk factors that may increase the likelihood of getting the baby blues or postpartum depression include:  Having a personal or family history of depression.   Having depression while being pregnant.   Having premenstrual mood issues or mood issues related to oral contraceptives.  Having a lot of life stress.   Having marital conflict.   Lacking a social support network.   Having a baby with special needs.   Having health problems, such as diabetes.  SIGNS AND SYMPTOMS Symptoms of baby blues include:  Brief changes in mood, such as going from extreme happiness to sadness.  Decreased concentration.   Difficulty sleeping.   Crying spells, tearfulness.   Irritability.   Anxiety.  Symptoms of postpartum depression typically begin within the first month after giving birth. These symptoms include:  Difficulty sleeping or excessive sleepiness.   Marked weight loss.   Agitation.   Feelings of worthlessness.   Lack of interest in activity or food.  Postpartum psychosis is a very serious condition and can be dangerous. Fortunately, it is rare. Displaying any of the following symptoms is cause for immediate medical attention. Symptoms of postpartum psychosis include:   Hallucinations and delusions.   Bizarre or disorganized behavior.   Confusion or disorientation.  DIAGNOSIS  A diagnosis is made by an evaluation of your symptoms. There are no medical or lab tests that lead to a diagnosis, but there are various questionnaires that a health care provider may use to identify those with the baby blues, postpartum depression, or psychosis. Often, a screening tool called the [place] Postnatal Depression Scale is used to  diagnose depression in the postpartum period.  TREATMENT The baby blues usually goes away on its own in 1-2 weeks. Social support is often all that is needed. You will be encouraged to get adequate sleep and rest. Occasionally, you may be given medicines to help you sleep.  Postpartum depression requires treatment because it can last several months or longer if it is not treated. Treatment may include individual or group therapy, medicine, or both to address any social, physiological, and psychological factors that may play a role in the depression. Regular exercise, a healthy diet, rest, and social support may also be strongly recommended.  Postpartum psychosis is more serious and needs treatment right away. Hospitalization is often needed. HOME CARE INSTRUCTIONS  Get as much rest as you can. Nap when the baby sleeps.   Exercise regularly. Some women find  yoga and walking to be beneficial.   Eat a balanced and nourishing diet.   Do little things that you enjoy. Have a cup of tea, take a bubble bath, read your favorite magazine, or listen to your favorite music.  Avoid alcohol.   Ask for help with household chores, cooking, grocery shopping, or running errands as needed. Do not try to do everything.   Talk to people close to you about how you are feeling. Get support from your partner, family members, friends, or other new moms.  Try to stay positive in how you think. Think about the things you are grateful for.   Do not spend a lot of time alone.   Only take over-the-counter or prescription medicine as directed by your health care provider.  Keep all your postpartum appointments.   Let your health care provider know if you have any concerns.  SEEK MEDICAL CARE IF: You are having a reaction to or problems with your medicine. SEEK IMMEDIATE MEDICAL CARE IF:  You have suicidal feelings.   You think you may harm the baby or someone else. MAKE SURE YOU:  Understand these  instructions.  Will watch your condition.  Will get help right away if you are not doing well or get worse. Document Released: 06/26/2004 Document Revised: 09/27/2013 Document Reviewed: 07/04/2013 [facility] Patient Information 2015 [place], [place]. This information is not intended to replace advice given to you by your health care provider. Make sure you discuss any questions you have with your health care provider.

## 2014-11-21 ENCOUNTER — Telehealth: Payer: Self-pay | Admitting: Obstetrics and Gynecology

## 2014-11-21 LAB — RH IG WORKUP (INCLUDES ABO/RH)
ABO/RH(D): O NEG
Fetal Screen: NEGATIVE
Gestational Age(Wks): 38
Unit division: 0

## 2014-11-21 NOTE — Lactation Note (Signed)
This note was copied from the chart of Monica Cox. Lactation Consultation Note  Mother and baby resting.  Put baby in crib for safe sleep. Suggest mother call when baby cues for assistance w/ breastfeeding.  Patient Name: Monica Cox AVWUJ'WToday's Date: 11/21/2014     Maternal Data    Feeding Feeding Type: Bottle Fed - Formula Nipple Type: Slow - flow Length of feed: 10 min  LATCH Score/Interventions                      Lactation Tools Discussed/Used     Consult Status      Hardie PulleyBerkelhammer, Laelia Angelo Boschen 11/21/2014, 10:44 AM

## 2014-11-21 NOTE — Telephone Encounter (Signed)
Tramadol 50 mg x 10 tabs called in to [facility].

## 2014-11-21 NOTE — Telephone Encounter (Signed)
Spoke with pt. Pt had a vaginal delivery with tear 11/19/14 and has been discharged. She was getting Percocet and Motrin in the hospital but was discharged home with no pain meds. Pt states she is hurting and is requesting something to help. Please advise. Thanks!! JSY

## 2014-11-21 NOTE — Discharge Summary (Signed)
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 She is up and moving with mild pain that is well controlled with percocet and motrin. She reports scant vaginal bleeding. She has urinated and is passing gas. She is tolerating PO. She has a history of PPD with her first child and depression throughout her most recent pregnancy. She is going to start zoloft on release from the hospital and stay with her mother for a few months. She reports that she gets overwhelmed when her baby does not latch during breastfeeding and "feels like I'm a bad mom or I'm doing something wrong". She is anxious about the possibility of PPD occuring again. She is able to identify her symptoms of depression and says she is looking forward to taking zoloft to better control her PPD. She does not have any thoughts of self harm or harming others.   Physical Exam:  General: alert and no distress Lochia: appropriate Uterine Fundus: firm Incision: N/A DVT Evaluation: No evidence of DVT seen on physical exam. No cords or calf tenderness. No significant calf/ankle edema.  Discharge Diagnoses: Term Pregnancy-delivered  Discharge Information: Date: 11/21/2014 Activity: pelvic rest Diet: routine Medications: Ibuprofen, zoloft Condition: stable Instructions: refer to practice specific booklet Discharge to: home   Newborn Data: Live born female  Birth Weight: 8 lb 9.4 oz (3895 g) APGAR: 9, 9  Home with mother.  Barrett,Stevi M 11/21/2014,  7:24 AM   OB fellow attestation Post Partum Day 2 I have seen and examined this patient and agree with above documentation in the student's note.   [name] is a 24 y.o. [id number] s/p NSVD.  Pt denies problems with ambulating, voiding or po intake. Pain is well controlled.  Plan for birth control is condoms.  Method of Feeding: breast bottle  PE:  BP 118/74 mmHg  Pulse 78  Temp(Src) 98 F (36.7 C) (Oral)  Resp 16  Ht 5' 3.5" (1.613 m)  Wt 200 lb 9.6 oz (90.992 kg)  BMI 34.97 kg/m2  SpO2 100%  LMP 02/26/2014  Breastfeeding? Unknown Fundus firm  Plan for discharge: 2  William DaltonMcEachern, Safwan Tomei, MD 8:26 AM

## 2014-11-21 NOTE — Lactation Note (Signed)
This note was copied from the chart of Monica Jaquila Cox. Lactation Consultation Note  Mother sleepy and baby hungry.  Suggest mother set her alarm for every 3 hours to wake to feed baby or do STS. Repositioned mother to side lying position and assisted w/ latching baby on deep.  Discussed the need to be deep on breast and massage to keep her active. Encouraged longer feedings. Mother asked about fenugreek to boost her milk supply.  Suggest she start post pumping at home w/ her DEBP 4-6 times a day for 15 min both breasts. Give baby back volume pumped at next feeding. Reviewed engorgement care and mastitis symptoms.  Patient Name: Monica Cox ONGEX'BToday's Date: 11/21/2014 Reason for consult: Follow-up assessment;Breast/nipple pain   Maternal Data    Feeding Feeding Type: Breast Fed  LATCH Score/Interventions Latch: Grasps breast easily, tongue down, lips flanged, rhythmical sucking. Intervention(s): Adjust position;Assist with latch;Breast massage  Audible Swallowing: A few with stimulation Intervention(s): Skin to skin  Type of Nipple: Everted at rest and after stimulation  Comfort (Breast/Nipple): Filling, red/small blisters or bruises, mild/mod discomfort  Problem noted: Mild/Moderate discomfort Interventions (Mild/moderate discomfort): Comfort gels;Hand expression  Hold (Positioning): Assistance needed to correctly position infant at breast and maintain latch.  LATCH Score: 7  Lactation Tools Discussed/Used     Consult Status Consult Status: Follow-up Date: 11/22/14 Follow-up type: In-patient    Dahlia ByesBerkelhammer, Nayleah Gamel [facility]os[facility] 11/21/2014, 11:08 AM

## 2014-11-22 ENCOUNTER — Encounter: Payer: Medicaid Other | Admitting: Women's Health

## 2014-11-22 ENCOUNTER — Telehealth: Payer: Self-pay | Admitting: *Deleted

## 2014-11-23 NOTE — Telephone Encounter (Signed)
Pt informed fenugreek is OTC med.

## 2014-11-25 ENCOUNTER — Emergency Department (HOSPITAL_COMMUNITY)
Admission: EM | Admit: 2014-11-25 | Discharge: 2014-11-25 | Disposition: A | Discharge status: Home / Self Care | Payer: Medicaid Other | Source: Home / Self Care | Attending: Emergency Medicine | Admitting: Emergency Medicine

## 2014-11-25 ENCOUNTER — Encounter (HOSPITAL_COMMUNITY): Payer: Self-pay | Admitting: *Deleted

## 2014-11-25 DIAGNOSIS — O9953 Diseases of the respiratory system complicating the puerperium: Secondary | ICD-10-CM | POA: Insufficient documentation

## 2014-11-25 DIAGNOSIS — R112 Nausea with vomiting, unspecified: Secondary | ICD-10-CM | POA: Insufficient documentation

## 2014-11-25 DIAGNOSIS — J45909 Unspecified asthma, uncomplicated: Secondary | ICD-10-CM | POA: Insufficient documentation

## 2014-11-25 DIAGNOSIS — K219 Gastro-esophageal reflux disease without esophagitis: Secondary | ICD-10-CM | POA: Insufficient documentation

## 2014-11-25 DIAGNOSIS — Z79899 Other long term (current) drug therapy: Secondary | ICD-10-CM | POA: Insufficient documentation

## 2014-11-25 DIAGNOSIS — R51 Headache: Principal | ICD-10-CM

## 2014-11-25 DIAGNOSIS — Z862 Personal history of diseases of the blood and blood-forming organs and certain disorders involving the immune mechanism: Secondary | ICD-10-CM | POA: Insufficient documentation

## 2014-11-25 DIAGNOSIS — Z8619 Personal history of other infectious and parasitic diseases: Secondary | ICD-10-CM | POA: Insufficient documentation

## 2014-11-25 DIAGNOSIS — O9963 Diseases of the digestive system complicating the puerperium: Secondary | ICD-10-CM | POA: Insufficient documentation

## 2014-11-25 DIAGNOSIS — R519 Headache, unspecified: Secondary | ICD-10-CM

## 2014-11-25 DIAGNOSIS — O9089 Other complications of the puerperium, not elsewhere classified: Secondary | ICD-10-CM | POA: Insufficient documentation

## 2014-11-25 MED ORDER — TRAMADOL HCL 50 MG PO TABS: 50.0000 mg | ORAL_TABLET | Freq: Four times a day (QID) | ORAL | Status: DC | PRN

## 2014-11-25 MED ORDER — LISINOPRIL 10 MG PO TABS: ORAL_TABLET | ORAL | Status: DC

## 2014-11-25 MED ORDER — DIPHENHYDRAMINE HCL 50 MG/ML IJ SOLN
25.0000 mg | Freq: Once | INTRAMUSCULAR | Status: AC
  Administered 2014-11-25: 25 mg via INTRAVENOUS
  Filled 2014-11-25: qty 1

## 2014-11-25 MED ORDER — KETOROLAC TROMETHAMINE 30 MG/ML IJ SOLN
30.0000 mg | Freq: Once | INTRAMUSCULAR | Status: AC
  Administered 2014-11-25: 30 mg via INTRAVENOUS
  Filled 2014-11-25: qty 1

## 2014-11-25 MED ORDER — METOCLOPRAMIDE HCL 5 MG/ML IJ SOLN
10.0000 mg | Freq: Once | INTRAMUSCULAR | Status: AC
  Administered 2014-11-25: 10 mg via INTRAVENOUS
  Filled 2014-11-25: qty 2

## 2014-11-25 NOTE — ED Provider Notes (Signed)
CSN: [id number]     Arrival date & time 11/25/14  2116 History  This chart was scribed for Benny Lennert, MD by Gwenyth Ober, ED Scribe. This patient was seen in room APA19/APA19 and the patient's care was started at 9:25 PM.    Chief Complaint  Patient presents with  . Headache   Patient is a 24 y.o. female presenting with headaches. The history is provided by the patient. No language interpreter was used.  Headache Pain location:  Generalized Quality:  Unable to specify Radiates to:  Does not radiate Onset quality:  Gradual Duration:  1 day Timing:  Constant Progression:  Unchanged Chronicity:  Recurrent Similar to prior headaches: yes   Relieved by:  None tried Worsened by:  Nothing Ineffective treatments:  None tried Associated symptoms: nausea   Associated symptoms: no abdominal pain, no back pain, no congestion, no cough, no diarrhea, no fatigue, no seizures and no sinus pressure     HPI Comments: Monica Cox is a 24 y.o. female who is 6 days post-partum and presents to the Emergency Department complaining of constant, moderate headache that started earlier today. Pt reports elevated BP at home and nausea as associated symptoms. She has a history of similar HAs which usually resolve on their own. Pt came to the ED for this HA because she is concerned about her elevated BP.   Past Medical History  Diagnosis Date  . Asthma   . Reflux   . Pregnant   . Vaginal Pap smear, abnormal   . Anemia   . Trichomonas infection 08-08-2014   Past Surgical History  Procedure Laterality Date  . None    . Esophagogastroduodenoscopy endoscopy    . Dilation and curettage of uterus      MAB Dr. Despina Hidden performed   Family History  Problem Relation Age of Onset  . Ulcers Father   . Ulcers Paternal Grandmother   . Diabetes Paternal Grandmother   . Ulcers Paternal Aunt   . Liver disease Neg Hx   . Colon cancer Neg Hx    History  Substance Use Topics  . Smoking status: Never  Smoker   . Smokeless tobacco: Never Used  . Alcohol Use: No     Comment: occasionally   OB History    Gravida Para Term Preterm AB TAB SAB Ectopic Multiple Living   0 1 0 1 0 0 2     Review of Systems  Constitutional: Negative for appetite change and fatigue.  HENT: Negative for congestion, ear discharge and sinus pressure.   Eyes: Negative for discharge.  Respiratory: Negative for cough.   Cardiovascular: Negative for chest pain.  Gastrointestinal: Positive for nausea. Negative for abdominal pain and diarrhea.  Genitourinary: Negative for frequency and hematuria.  Musculoskeletal: Negative for back pain.  Skin: Negative for rash.  Neurological: Positive for headaches. Negative for seizures.  Psychiatric/Behavioral: Negative for hallucinations.  All other systems reviewed and are negative.  Allergies  Review of patient's allergies indicates no known allergies.  Home Medications   Prior to Admission medications   Medication Sig Start Date End Date Taking? Authorizing Provider  acetaminophen (TYLENOL) 325 MG tablet Take 650 mg by mouth every 6 (six) hours as needed (pain).     Historical Provider, MD  albuterol (PROVENTIL HFA;VENTOLIN HFA) 108 (90 BASE) MCG/ACT inhaler Inhale 2 puffs into the lungs every 6 (six) hours as needed for wheezing or shortness of breath.    Historical Provider, MD  ibuprofen (ADVIL,MOTRIN) 600 MG tablet Take 1 tablet (600 mg total) by mouth every 6 (six) hours as needed for cramping. 11/20/14   Dorathy KinsmanVirginia Smith, CNM  meclizine (ANTIVERT) 32 MG tablet Take 1 tablet (32 mg total) by mouth 3 (three) times daily as needed. Patient taking differently: Take 32 mg by mouth 3 (three) times daily as needed for dizziness.  10/25/14   Marge DuncansKimberly Randall Booker, CNM  pantoprazole (PROTONIX) 20 MG tablet Take 1 tablet (20 mg total) by mouth daily. 04/26/14   Marge DuncansKimberly Randall Booker, CNM  Prenatal Multivit-Min-Fe-FA (PRENATAL VITAMINS) 0.8 MG tablet Take 1 tablet by  mouth daily. 04/03/14   Vanetta MuldersScott Zackowski, MD  sertraline (ZOLOFT) 50 MG tablet Take 1 tablet (50 mg total) by mouth daily. 11/20/14 11/20/15  Dorathy KinsmanVirginia Smith, CNM   BP 150/111 mmHg  Pulse 71  Temp(Src) 98.9 F (37.2 C) (Oral)  Resp 19  Ht 5\' 3"  (1.6 m)  Wt 195 lb (88.451 kg)  BMI 34.55 kg/m2  SpO2 100%  LMP 02/26/2014 Physical Exam  Constitutional: She is oriented to person, place, and time. She appears well-developed.  HENT:  Head: Normocephalic.  Eyes: Conjunctivae and EOM are normal. No scleral icterus.  Neck: Neck supple. No thyromegaly present.  Cardiovascular: Normal rate and regular rhythm.  Exam reveals no gallop and no friction rub.   No murmur heard. Pulmonary/Chest: No stridor. She has no wheezes. She has no rales. She exhibits no tenderness.  Abdominal: She exhibits no distension. There is no tenderness. There is no rebound.  Musculoskeletal: Normal range of motion. She exhibits no edema.  Lymphadenopathy:    She has no cervical adenopathy.  Neurological: She is oriented to person, place, and time. She exhibits normal muscle tone. Coordination normal.  Skin: No rash noted. No erythema.  Psychiatric: She has a normal mood and affect. Her behavior is normal.  Nursing note and vitals reviewed.   ED Course  Procedures (including critical care time) DIAGNOSTIC STUDIES: Oxygen Saturation is 100% on RA, normal by my interpretation.    COORDINATION OF CARE: 9:29 PM Discussed treatment plan with pt at bedside and pt agreed to plan.  Labs Review Labs Reviewed - No data to display  Imaging Review No results found.   EKG Interpretation None      MDM   Final diagnoses:  None   Headache,  Htn,  tx with lisinopril and ultram   The chart was scribed for me under my direct supervision.  I personally performed the history, physical, and medical decision making and all procedures in the evaluation of this patient.Benny Lennert.   Makalyn Lennox L Syretta Kochel, MD 11/25/14 [phone]

## 2014-11-25 NOTE — ED Notes (Signed)
Pt c/o headache all day and when her BP was taken it registered 134/100; pt states she is 6 days postpartum; pt c/o swelling to feet and ankles

## 2014-11-25 NOTE — Discharge Instructions (Signed)
Follow up with your md next week. °

## 2014-11-26 ENCOUNTER — Inpatient Hospital Stay (HOSPITAL_COMMUNITY)
Admission: AD | Admit: 2014-11-26 | Discharge: 2014-11-29 | DRG: 776 | Disposition: A | Discharge status: Home / Self Care | Payer: Medicaid Other | Source: Ambulatory Visit | Attending: Obstetrics & Gynecology | Admitting: Obstetrics & Gynecology

## 2014-11-26 ENCOUNTER — Encounter (HOSPITAL_COMMUNITY): Payer: Self-pay

## 2014-11-26 DIAGNOSIS — J45909 Unspecified asthma, uncomplicated: Secondary | ICD-10-CM | POA: Diagnosis present

## 2014-11-26 DIAGNOSIS — O1490 Unspecified pre-eclampsia, unspecified trimester: Secondary | ICD-10-CM | POA: Diagnosis present

## 2014-11-26 DIAGNOSIS — K219 Gastro-esophageal reflux disease without esophagitis: Secondary | ICD-10-CM | POA: Diagnosis present

## 2014-11-26 DIAGNOSIS — O9953 Diseases of the respiratory system complicating the puerperium: Secondary | ICD-10-CM | POA: Diagnosis present

## 2014-11-26 DIAGNOSIS — O1495 Unspecified pre-eclampsia, complicating the puerperium: Secondary | ICD-10-CM | POA: Diagnosis present

## 2014-11-26 DIAGNOSIS — O9903 Anemia complicating the puerperium: Secondary | ICD-10-CM | POA: Diagnosis present

## 2014-11-26 DIAGNOSIS — O9963 Diseases of the digestive system complicating the puerperium: Secondary | ICD-10-CM | POA: Diagnosis present

## 2014-11-26 DIAGNOSIS — D649 Anemia, unspecified: Secondary | ICD-10-CM | POA: Diagnosis present

## 2014-11-26 DIAGNOSIS — O9089 Other complications of the puerperium, not elsewhere classified: Principal | ICD-10-CM | POA: Diagnosis present

## 2014-11-26 DIAGNOSIS — R030 Elevated blood-pressure reading, without diagnosis of hypertension: Secondary | ICD-10-CM | POA: Diagnosis not present

## 2014-11-26 DIAGNOSIS — Z79899 Other long term (current) drug therapy: Secondary | ICD-10-CM | POA: Diagnosis not present

## 2014-11-26 LAB — COMPREHENSIVE METABOLIC PANEL
ALBUMIN: 2.8 g/dL — AB (ref 3.5–5.2)
ALT: 49 U/L — AB (ref 0–35)
AST: 34 U/L (ref 0–37)
Alkaline Phosphatase: 100 U/L (ref 39–117)
Anion gap: 2 — ABNORMAL LOW (ref 5–15)
BUN: 14 mg/dL (ref 6–23)
CHLORIDE: 112 mmol/L (ref 96–112)
CO2: 26 mmol/L (ref 19–32)
CREATININE: 0.72 mg/dL (ref 0.50–1.10)
Calcium: 8.3 mg/dL — ABNORMAL LOW (ref 8.4–10.5)
GFR calc Af Amer: >90 mL/min (ref >90)
GFR calc non Af Amer: >90 mL/min (ref >90)
Glucose, Bld: 84 mg/dL (ref 70–99)
Potassium: 4.1 mmol/L (ref 3.5–5.1)
SODIUM: 140 mmol/L (ref 135–145)
Total Bilirubin: 0.4 mg/dL (ref 0.3–1.2)
Total Protein: 6.3 g/dL (ref 6.0–8.3)

## 2014-11-26 LAB — URINALYSIS, ROUTINE W REFLEX MICROSCOPIC
Bilirubin Urine: NEGATIVE
Glucose, UA: NEGATIVE mg/dL
Ketones, ur: NEGATIVE mg/dL
NITRITE: NEGATIVE
PROTEIN: NEGATIVE mg/dL
Specific Gravity, Urine: 1.015 (ref 1.005–1.030)
UROBILINOGEN UA: 4 mg/dL — AB (ref 0.0–1.0)
pH: 7.5 (ref 5.0–8.0)

## 2014-11-26 LAB — CBC
HCT: 30.2 % — ABNORMAL LOW (ref 36.0–46.0)
Hemoglobin: 10.2 g/dL — ABNORMAL LOW (ref 12.0–15.0)
MCH: 31.2 pg (ref 26.0–34.0)
MCHC: 33.8 g/dL (ref 30.0–36.0)
MCV: 92.4 fL (ref 78.0–100.0)
Platelets: 255 10*3/uL (ref 150–400)
RBC: 3.27 MIL/uL — ABNORMAL LOW (ref 3.87–5.11)
RDW: 13.7 % (ref 11.5–15.5)
WBC: 5 10*3/uL (ref 4.0–10.5)

## 2014-11-26 LAB — PROTEIN / CREATININE RATIO, URINE
Creatinine, Urine: 184 mg/dL
Protein Creatinine Ratio: 0.1 (ref 0.00–0.15)
Total Protein, Urine: 19 mg/dL

## 2014-11-26 LAB — URINE MICROSCOPIC-ADD ON

## 2014-11-26 MED ORDER — SIMETHICONE 80 MG PO CHEW: 80.0000 mg | CHEWABLE_TABLET | ORAL | Status: DC | PRN

## 2014-11-26 MED ORDER — TRIAMTERENE-HCTZ 37.5-25 MG PO TABS
2.0000 | ORAL_TABLET | Freq: Once | ORAL | Status: AC
  Administered 2014-11-26: 2 via ORAL
  Filled 2014-11-26: qty 2

## 2014-11-26 MED ORDER — DIPHENHYDRAMINE HCL 25 MG PO CAPS
25.0000 mg | ORAL_CAPSULE | Freq: Four times a day (QID) | ORAL | Status: DC | PRN
  Administered 2014-11-27: 25 mg via ORAL
  Filled 2014-11-26: qty 1

## 2014-11-26 MED ORDER — MAGNESIUM SULFATE 40 G IN LACTATED RINGERS - SIMPLE: 2.0000 g/h | Freq: Once | INTRAVENOUS | Status: DC

## 2014-11-26 MED ORDER — SERTRALINE HCL 50 MG PO TABS
50.0000 mg | ORAL_TABLET | Freq: Every day | ORAL | Status: DC
  Administered 2014-11-27 – 2014-11-29 (×3): 50 mg via ORAL
  Filled 2014-11-26 (×5): qty 1

## 2014-11-26 MED ORDER — MAGNESIUM SULFATE BOLUS VIA INFUSION
4.0000 g | Freq: Once | INTRAVENOUS | Status: AC
  Administered 2014-11-26: 4 g via INTRAVENOUS
  Filled 2014-11-26: qty 500

## 2014-11-26 MED ORDER — ZOLPIDEM TARTRATE 5 MG PO TABS
5.0000 mg | ORAL_TABLET | Freq: Every evening | ORAL | Status: DC | PRN
Start: 2014-11-26 — End: 2014-11-29

## 2014-11-26 MED ORDER — ONDANSETRON HCL 4 MG/2ML IJ SOLN: 4.0000 mg | INTRAMUSCULAR | Status: DC | PRN

## 2014-11-26 MED ORDER — MAGNESIUM SULFATE 40 G IN LACTATED RINGERS - SIMPLE
2.0000 g/h | INTRAVENOUS | Status: DC
  Administered 2014-11-26 – 2014-11-27 (×2): 2 g/h via INTRAVENOUS
  Filled 2014-11-26 (×2): qty 500

## 2014-11-26 MED ORDER — TRIAMTERENE-HCTZ 37.5-25 MG PO TABS
2.0000 | ORAL_TABLET | Freq: Every day | ORAL | Status: DC
  Filled 2014-11-26 (×2): qty 2

## 2014-11-26 MED ORDER — OXYCODONE-ACETAMINOPHEN 5-325 MG PO TABS
2.0000 | ORAL_TABLET | ORAL | Status: DC | PRN
  Administered 2014-11-29: 2 via ORAL
  Filled 2014-11-26: qty 2

## 2014-11-26 MED ORDER — OXYCODONE-ACETAMINOPHEN 5-325 MG PO TABS
2.0000 | ORAL_TABLET | Freq: Once | ORAL | Status: AC
  Administered 2014-11-26: 2 via ORAL
  Filled 2014-11-26: qty 2

## 2014-11-26 MED ORDER — LACTATED RINGERS IV SOLN
INTRAVENOUS | Status: DC
  Administered 2014-11-26 – 2014-11-27 (×3): via INTRAVENOUS

## 2014-11-26 MED ORDER — IBUPROFEN 600 MG PO TABS
600.0000 mg | ORAL_TABLET | Freq: Four times a day (QID) | ORAL | Status: DC
  Administered 2014-11-27 – 2014-11-29 (×11): 600 mg via ORAL
  Filled 2014-11-26 (×11): qty 1

## 2014-11-26 MED ORDER — DIBUCAINE 1 % RE OINT: 1.0000 "application " | TOPICAL_OINTMENT | RECTAL | Status: DC | PRN

## 2014-11-26 MED ORDER — OXYCODONE-ACETAMINOPHEN 5-325 MG PO TABS
1.0000 | ORAL_TABLET | ORAL | Status: DC | PRN
Start: 2014-11-26 — End: 2014-11-29
  Administered 2014-11-26: 1 via ORAL
  Filled 2014-11-26: qty 1

## 2014-11-26 MED ORDER — ONDANSETRON HCL 4 MG PO TABS
4.0000 mg | ORAL_TABLET | ORAL | Status: DC | PRN
  Administered 2014-11-28 – 2014-11-29 (×3): 4 mg via ORAL
  Filled 2014-11-26 (×3): qty 1

## 2014-11-26 MED ORDER — MAGNESIUM SULFATE 4 GM/100ML IV SOLN: 4.0000 g | Freq: Once | INTRAVENOUS | Status: DC

## 2014-11-26 MED ORDER — WITCH HAZEL-GLYCERIN EX PADS: 1.0000 "application " | MEDICATED_PAD | CUTANEOUS | Status: DC | PRN

## 2014-11-26 NOTE — MAU Note (Signed)
Pt presents complaining of a headache and swelling. Denies BP problems in pregnancy. States she checked her BP at home and it was in the 160s/110s. Denies visual changes.

## 2014-11-26 NOTE — H&P (Signed)
Arrival date and time: 11/26/14 1644    First Provider Initiated Contact with Patient 11/26/14 1708          Chief Complaint   Patient presents with   .  Elevated Blood Pressure     HPI 24 y.o. [id number] at 1 week postpartum c/o headache, swelling, elevated BP starting yesterday. Pt went to [facility] ED yesterday, BP 150s-160s/110-100, was given lisinopril and IV meds (benadryl, toradol, reglan) for headache and d/c'd home with rx for lisinopril. Pt states she did not take the lisinopril today. Headache returned today, too BP at home, 160/100, so came to MAU. No hypertension in pregnacy. Pt is not breastfeeding.     Past Medical History   Diagnosis  Date   .  Asthma     .  Reflux     .  Pregnant     .  Vaginal Pap smear, abnormal     .  Anemia     .  Trichomonas infection  08-08-2014       Past Surgical History   Procedure  Laterality  Date   .  None       .  Esophagogastroduodenoscopy endoscopy       .  Dilation and curettage of uterus           [facility] Dr. Despina Hidden performed       Family History   Problem  Relation  Age of Onset   .  Ulcers  Father     .  Ulcers  Paternal Grandmother     .  Diabetes  Paternal Grandmother     .  Ulcers  Paternal Aunt     .  Liver disease  Neg Hx     .  Colon cancer  Neg Hx         History   Substance Use Topics   .  Smoking status:  Never Smoker    .  Smokeless tobacco:  Never Used   .  Alcohol Use:  No         Comment: occasionally      Allergies: No Known Allergies    Prescriptions prior to admission   Medication  Sig  Dispense  Refill  Last Dose   .  ibuprofen (ADVIL,MOTRIN) 600 MG tablet  Take 1 tablet (600 mg total) by mouth every 6 (six) hours as needed for cramping.  30 tablet  1  11/25/2014 at Unknown time   .  meclizine (ANTIVERT) 32 MG tablet  Take 1 tablet (32 mg total) by mouth 3 (three) times daily as needed. (Patient taking differently: Take 32 mg by mouth 3 (three) times daily as needed for dizziness. )  30 tablet  0  two  weeks   .  pantoprazole (PROTONIX) 20 MG tablet  Take 1 tablet (20 mg total) by mouth daily.  30 tablet  6  Past Week at Unknown time   .  Prenatal Multivit-Min-Fe-FA (PRENATAL VITAMINS) 0.8 MG tablet  Take 1 tablet by mouth daily.  30 tablet  3  two weeks   .  sertraline (ZOLOFT) 50 MG tablet  Take 1 tablet (50 mg total) by mouth daily.  30 tablet  2  11/25/2014 at Unknown time   .  traMADol (ULTRAM) 50 MG tablet  Take 1 tablet (50 mg total) by mouth every 6 (six) hours as needed.  10 tablet  0  Past Week at Unknown time   .  albuterol (PROVENTIL HFA;VENTOLIN HFA)  108 (90 BASE) MCG/ACT inhaler  Inhale 2 puffs into the lungs every 6 (six) hours as needed for wheezing or shortness of breath.      rescue   .  lisinopril (PRINIVIL,ZESTRIL) 10 MG tablet  Take one tablet a day (Patient not taking: Reported on 11/26/2014)  30 tablet  0        Review of Systems  Constitutional: Negative.   Eyes: Negative for blurred vision.        Neg visual disturbance  Respiratory: Negative.   Cardiovascular: Negative.   Gastrointestinal: Positive for abdominal pain (RUQ/epigastric). Negative for nausea, vomiting, diarrhea and constipation.  Genitourinary: Negative for dysuria, urgency, frequency, hematuria and flank pain.  Musculoskeletal: Negative.         + BLE edema  Neurological: Positive for headaches.  Psychiatric/Behavioral: Negative.   Physical Exam      Blood pressure 164/98, pulse 56, temperature 98 F (36.7 C), temperature source Oral, resp. rate 18, last menstrual period 02/26/2014, unknown if currently breastfeeding.   Physical Exam  Nursing note and vitals reviewed. Constitutional: She is oriented to person, place, and time. She appears well-developed and well-nourished. No distress.  HENT:   Head: Normocephalic and atraumatic.  Cardiovascular: Normal rate and regular rhythm.   Respiratory: Effort normal.  GI: Soft. She exhibits no mass. There is tenderness (RUQ/epigastric). There is no  rebound and no guarding.  Musculoskeletal: Normal range of motion.  Neurological: She is alert and oriented to person, place, and time.   Reflex Scores:      Patellar reflexes are 3+ on the right side and 3+ on the left side. Neg clonus  Skin: Skin is warm and dry.  Psychiatric: She has a normal mood and affect.     Results for orders placed or performed during the hospital encounter of 11/26/14 (from the past 24 hour(s))  Urinalysis, Routine w reflex microscopic     Status: Abnormal   Collection Time: 11/26/14  4:50 PM  Result Value Ref Range   Color, Urine YELLOW YELLOW   APPearance CLEAR CLEAR   Specific Gravity, Urine 1.015 1.005 - 1.030   pH 7.5 5.0 - 8.0   Glucose, UA NEGATIVE NEGATIVE mg/dL   Hgb urine dipstick LARGE (A) NEGATIVE   Bilirubin Urine NEGATIVE NEGATIVE   Ketones, ur NEGATIVE NEGATIVE mg/dL   Protein, ur NEGATIVE NEGATIVE mg/dL   Urobilinogen, UA 4.0 (H) 0.0 - 1.0 mg/dL   Nitrite NEGATIVE NEGATIVE   Leukocytes, UA MODERATE (A) NEGATIVE  Urine microscopic-add on     Status: None   Collection Time: 11/26/14  4:50 PM  Result Value Ref Range   Squamous Epithelial / LPF RARE RARE   WBC, UA 7-10 <3 WBC/hpf   RBC / HPF 3-6 <3 RBC/hpf   Bacteria, UA RARE RARE   Urine-Other MUCOUS PRESENT   CBC     Status: Abnormal   Collection Time: 11/26/14  5:28 PM  Result Value Ref Range   WBC 5.0 4.0 - 10.5 K/uL   RBC 3.27 (L) 3.87 - 5.11 MIL/uL   Hemoglobin 10.2 (L) 12.0 - 15.0 g/dL   HCT 16.1 (L) 09.6 - 04.5 %   MCV 92.4 78.0 - 100.0 fL   MCH 31.2 26.0 - 34.0 pg   MCHC 33.8 30.0 - 36.0 g/dL   RDW 40.9 81.1 - 91.4 %   Platelets 255 150 - 400 K/uL   Patient Vitals for the past 24 hrs:  BP Temp Temp src Pulse Resp  11/26/14  1733 164/98 mmHg - - (!) 56 -  11/26/14 1731 184/96 mmHg - - (!) 55 -  11/26/14 1716 176/100 mmHg - - (!) 55 -  11/26/14 1659 137/80 mmHg 98 F (36.7 C) Oral (!) 55 18         Assessment and Plan    24 y.o. [id number] at 1 week postpartum w/  severe range BP, headache and RUQ pain Admit to AICU per Dr. Despina HiddenEure for MgSO4 CBC, CMP, UPC ordered Percocet for headache and Maxzide 75/50 for BP in MAU    FRAZIER,NATALIE 11/26/2014, 5:48 PM

## 2014-11-26 NOTE — MAU Provider Note (Signed)
History     CSN: [id number]  Arrival date and time: 11/26/14 1644   First Provider Initiated Contact with Patient 11/26/14 1708      Chief Complaint  Patient presents with  . Elevated Blood Pressure    HPI 24 y.o. [id number] at 1 week postpartum c/o headache, swelling, elevated BP starting yesterday. [facility] went [facility] yesterday, BP 150s-160s/110-100, was given lisinopril and IV meds (benadryl, toradol, reglan) for headache and d/c'd home with rx for lisinopril. Pt states she did not take the lisinopril today. Headache returned today, too BP at home, 160/100, so came to MAU. No hypertension in pregnacy. Pt is not breastfeeding.   Past Medical History  Diagnosis Date  . Asthma   . Reflux   . Pregnant   . Vaginal Pap smear, abnormal   . Anemia   . Trichomonas infection 08-08-2014    Past Surgical History  Procedure Laterality Date  . None    . Esophagogastroduodenoscopy endoscopy    . Dilation and curettage of uterus      [facility] Dr. Despina HiddenEure performed    Family History  Problem Relation Age of Onset  . Ulcers Father   . Ulcers Paternal Grandmother   . Diabetes Paternal Grandmother   . Ulcers Paternal Aunt   . Liver disease Neg Hx   . Colon cancer Neg Hx     History  Substance Use Topics  . Smoking status: Never Smoker   . Smokeless tobacco: Never Used  . Alcohol Use: No     Comment: occasionally    Allergies: No Known Allergies  Prescriptions prior to admission  Medication Sig Dispense Refill Last Dose  . ibuprofen (ADVIL,MOTRIN) 600 MG tablet Take 1 tablet (600 mg total) by mouth every 6 (six) hours as needed for cramping. 30 tablet 1 11/25/2014 at Unknown time  . meclizine (ANTIVERT) 32 MG tablet Take 1 tablet (32 mg total) by mouth 3 (three) times daily as needed. (Patient taking differently: Take 32 mg by mouth 3 (three) times daily as needed for dizziness. ) 30 tablet 0 two weeks  . pantoprazole (PROTONIX) 20 MG tablet Take 1 tablet (20 mg total) by mouth  daily. 30 tablet 6 Past Week at Unknown time  . Prenatal Multivit-Min-Fe-FA (PRENATAL VITAMINS) 0.8 MG tablet Take 1 tablet by mouth daily. 30 tablet 3 two weeks  . sertraline (ZOLOFT) 50 MG tablet Take 1 tablet (50 mg total) by mouth daily. 30 tablet 2 11/25/2014 at Unknown time  . traMADol (ULTRAM) 50 MG tablet Take 1 tablet (50 mg total) by mouth every 6 (six) hours as needed. 10 tablet 0 Past Week at Unknown time  . albuterol (PROVENTIL HFA;VENTOLIN HFA) 108 (90 BASE) MCG/ACT inhaler Inhale 2 puffs into the lungs every 6 (six) hours as needed for wheezing or shortness of breath.   rescue  . lisinopril (PRINIVIL,ZESTRIL) 10 MG tablet Take one tablet a day (Patient not taking: Reported on 11/26/2014) 30 tablet 0     Review of Systems  Constitutional: Negative.   Eyes: Negative for blurred vision.       Neg visual disturbance  Respiratory: Negative.   Cardiovascular: Negative.   Gastrointestinal: Positive for abdominal pain (RUQ/epigastric). Negative for nausea, vomiting, diarrhea and constipation.  Genitourinary: Negative for dysuria, urgency, frequency, hematuria and flank pain.  Musculoskeletal: Negative.        + BLE edema  Neurological: Positive for headaches.  Psychiatric/Behavioral: Negative.    Physical Exam   Blood pressure 164/98, pulse  56, temperature 98 F (36.7 C), temperature source Oral, resp. rate 18, last menstrual period 02/26/2014, unknown if currently breastfeeding.  Physical Exam  Nursing note and vitals reviewed. Constitutional: She is oriented to person, place, and time. She appears well-developed and well-nourished. No distress.  HENT:  Head: Normocephalic and atraumatic.  Cardiovascular: Normal rate and regular rhythm.   Respiratory: Effort normal.  GI: Soft. She exhibits no mass. There is tenderness (RUQ/epigastric). There is no rebound and no guarding.  Musculoskeletal: Normal range of motion.  Neurological: She is alert and oriented to person, place, and  time.  Reflex Scores:      Patellar reflexes are 3+ on the right side and 3+ on the left side. Neg clonus   Skin: Skin is warm and dry.  Psychiatric: She has a normal mood and affect.    MAU Course  Procedures  Results for orders placed or performed during the hospital encounter of 11/26/14 (from the past 24 hour(s))  Urinalysis, Routine w reflex microscopic     Status: Abnormal   Collection Time: 11/26/14  4:50 PM  Result Value Ref Range   Color, Urine YELLOW YELLOW   APPearance CLEAR CLEAR   Specific Gravity, Urine 1.015 1.005 - 1.030   pH 7.5 5.0 - 8.0   Glucose, UA NEGATIVE NEGATIVE mg/dL   Hgb urine dipstick LARGE (A) NEGATIVE   Bilirubin Urine NEGATIVE NEGATIVE   Ketones, ur NEGATIVE NEGATIVE mg/dL   Protein, ur NEGATIVE NEGATIVE mg/dL   Urobilinogen, UA 4.0 (H) 0.0 - 1.0 mg/dL   Nitrite NEGATIVE NEGATIVE   Leukocytes, UA MODERATE (A) NEGATIVE  Urine microscopic-add on     Status: None   Collection Time: 11/26/14  4:50 PM  Result Value Ref Range   Squamous Epithelial / LPF RARE RARE   WBC, UA 7-10 <3 WBC/hpf   RBC / HPF 3-6 <3 RBC/hpf   Bacteria, UA RARE RARE   Urine-Other MUCOUS PRESENT    Patient Vitals for the past 24 hrs:  BP Temp Temp src Pulse Resp  11/26/14 1733 164/98 mmHg - - (!) 56 -  11/26/14 1731 184/96 mmHg - - (!) 55 -  11/26/14 1716 176/100 mmHg - - (!) 55 -  11/26/14 1659 137/80 mmHg 98 F (36.7 C) Oral (!) 55 18     Assessment and Plan  24 y.o. [id number] at 1 week postpartum w/ severe range BP, headache and RUQ pain Admit to AICU per Dr. Despina Hidden for MgSO4 CBC, CMP, UPC ordered Percocet for headache and Maxzide 75/50 for BP in MAU   Maher Shon 11/26/2014, 5:48 PM

## 2014-11-27 ENCOUNTER — Encounter (HOSPITAL_COMMUNITY): Payer: Self-pay | Admitting: *Deleted

## 2014-11-27 LAB — MRSA PCR SCREENING: MRSA by PCR: NEGATIVE

## 2014-11-27 MED ORDER — SODIUM CHLORIDE 0.9 % IJ SOLN
3.0000 mL | Freq: Two times a day (BID) | INTRAMUSCULAR | Status: DC
  Administered 2014-11-27 – 2014-11-28 (×3): 3 mL via INTRAVENOUS

## 2014-11-27 MED ORDER — BUTALBITAL-APAP-CAFFEINE 50-325-40 MG PO TABS
2.0000 | ORAL_TABLET | Freq: Four times a day (QID) | ORAL | Status: DC | PRN
  Administered 2014-11-27 – 2014-11-29 (×7): 2 via ORAL
  Filled 2014-11-27 (×7): qty 2

## 2014-11-27 MED ORDER — SODIUM CHLORIDE 0.9 % IJ SOLN: 3.0000 mL | INTRAMUSCULAR | Status: DC | PRN

## 2014-11-27 NOTE — Progress Notes (Signed)
Patient ID: MIKAELLA ESCALONA, female   DOB: [birth year], 24 y.o.   MRN: [id number] 8 days post partum from vaginal delivery with Post partum pre eclampsia  S:  Having a bitemporal /bifrontal headache Worse on magnesium No scotomata, no other visual disturbances No other complaints  O: Filed Vitals:   11/27/14 0300 11/27/14 0400 11/27/14 0533 11/27/14 0600  BP: 123/75 116/96 140/88 125/79  Pulse: 65 69 60 60  Temp:    97.5 F (36.4 C)  TempSrc:    Oral  Resp: 16   18  Height:      Weight:      SpO2: 98% 98% 96% 100%   Results for orders placed or performed during the hospital encounter of 11/26/14 (from the past 24 hour(s))  Urinalysis, Routine w reflex microscopic     Status: Abnormal   Collection Time: 11/26/14  4:50 PM  Result Value Ref Range   Color, Urine YELLOW YELLOW   APPearance CLEAR CLEAR   Specific Gravity, Urine 1.015 1.005 - 1.030   pH 7.5 5.0 - 8.0   Glucose, UA NEGATIVE NEGATIVE mg/dL   Hgb urine dipstick LARGE (A) NEGATIVE   Bilirubin Urine NEGATIVE NEGATIVE   Ketones, ur NEGATIVE NEGATIVE mg/dL   Protein, ur NEGATIVE NEGATIVE mg/dL   Urobilinogen, UA 4.0 (H) 0.0 - 1.0 mg/dL   Nitrite NEGATIVE NEGATIVE   Leukocytes, UA MODERATE (A) NEGATIVE  Protein / creatinine ratio, urine     Status: None   Collection Time: 11/26/14  4:50 PM  Result Value Ref Range   Creatinine, Urine 184.00 mg/dL   Total Protein, Urine 19 mg/dL   Protein Creatinine Ratio 0.10 0.00 - 0.15  Urine microscopic-add on     Status: None   Collection Time: 11/26/14  4:50 PM  Result Value Ref Range   Squamous Epithelial / LPF RARE RARE   WBC, UA 7-10 <3 WBC/hpf   RBC / HPF 3-6 <3 RBC/hpf   Bacteria, UA RARE RARE   Urine-Other MUCOUS PRESENT   CBC     Status: Abnormal   Collection Time: 11/26/14  5:28 PM  Result Value Ref Range   WBC 5.0 4.0 - 10.5 K/uL   RBC 3.27 (L) 3.87 - 5.11 MIL/uL   Hemoglobin 10.2 (L) 12.0 - 15.0 g/dL   HCT 65.7 (L) 84.6 - 96.2 %   MCV 92.4 78.0 - 100.0 fL   MCH 31.2 26.0 - 34.0 pg   MCHC 33.8 30.0 - 36.0 g/dL   RDW 95.2 84.1 - 32.4 %   Platelets 255 150 - 400 K/uL  Comprehensive metabolic panel     Status: Abnormal   Collection Time: 11/26/14  5:28 PM  Result Value Ref Range   Sodium 140 135 - 145 mmol/L   Potassium 4.1 3.5 - 5.1 mmol/L   Chloride 112 96 - 112 mmol/L   CO2 26 19 - 32 mmol/L   Glucose, Bld 84 70 - 99 mg/dL   BUN 14 6 - 23 mg/dL   Creatinine, Ser 4.01 0.50 - 1.10 mg/dL   Calcium 8.3 (L) 8.4 - 10.5 mg/dL   Total Protein 6.3 6.0 - 8.3 g/dL   Albumin 2.8 (L) 3.5 - 5.2 g/dL   AST 34 0 - 37 U/L   ALT 49 (H) 0 - 35 U/L   Alkaline Phosphatase 100 39 - 117 U/L   Total Bilirubin 0.4 0.3 - 1.2 mg/dL   GFR calc non Af Amer >90 >90 mL/min   GFR calc Af  Amer >90 >90 mL/min   Anion gap 2 (L) 5 - 15   General  WDWN female in NAD Abdomen soft benign Extremities 2+ edema,  DTR 3+ 1 beat of clonus  Labs are essentially normal, no evidence HELLP  A; Post partum pre eclampsia:  BP has stabilized, on maxzide 75/50  P: Mag off tonight at 2000 Fioricet for headache if persists even after magnesium or if visual disturbsance occur consider CT, probably PRES

## 2014-11-28 ENCOUNTER — Inpatient Hospital Stay (HOSPITAL_COMMUNITY): Payer: Medicaid Other

## 2014-11-28 DIAGNOSIS — O1493 Unspecified pre-eclampsia, third trimester: Secondary | ICD-10-CM

## 2014-11-28 MED ORDER — TRIAMTERENE-HCTZ 37.5-25 MG PO TABS
2.0000 | ORAL_TABLET | Freq: Every day | ORAL | Status: DC
  Administered 2014-11-28 – 2014-11-29 (×2): 2 via ORAL
  Filled 2014-11-28 (×3): qty 2

## 2014-11-28 MED ORDER — IOHEXOL 300 MG/ML  SOLN
75.0000 mL | Freq: Once | INTRAMUSCULAR | Status: AC | PRN
  Administered 2014-11-28: 75 mL via INTRAVENOUS

## 2014-11-28 NOTE — Progress Notes (Signed)
UR chart review completed.  

## 2014-11-28 NOTE — Progress Notes (Signed)
Patient ID: [name], female   DOB: 1991/08/11, 24 y.o.   MRN: [id number] 9 days post partum from vaginal delivery with Post partum pre eclampsia  S: Still having a severe bitemporal /bifrontal headache off magnesium No scotomata, no other visual disturbances. No other complaints  O: Filed Vitals:   11/28/14 0400 11/28/14 0628 11/28/14 0637 11/28/14 0744  BP: 139/100 148/100  142/85  Pulse: 61   56  Temp: 98.4 F (36.9 C)   98.8 F (37.1 C)  TempSrc:    Oral  Resp: 18   18  Height:      Weight:   193 lb 0.8 oz (87.567 kg)   SpO2: 97%   99%   02/22 0701 - 02/23 0700 In: 3445 [P.O.:1820; I.V.:1625] Out: 2800 [Urine:2800]  General  WDWN female in NAD Abdomen soft benign Extremities 1+ edema,  DTR 2+   Labs are essentially normal, no evidence HELLP  A; Post partum pre eclampsia:  BP is improved, on maxzide 75/50  P: Will obtain head CT to evaluate for PRES, sinus venous thrombosis, ?bleed.  [facility], the best study recommended was CT head w and w/o contrast. This was ordered Continue Maxzide If CT scan negative, continue analgesia prn and transfer to floor May consider discharge to home later today if stable.   Tereso NewcomerANYANWU,UGONNA A, MD

## 2014-11-29 ENCOUNTER — Encounter: Payer: Self-pay | Admitting: Obstetrics & Gynecology

## 2014-11-29 DIAGNOSIS — O1490 Unspecified pre-eclampsia, unspecified trimester: Secondary | ICD-10-CM

## 2014-11-29 DIAGNOSIS — Z3A38 38 weeks gestation of pregnancy: Secondary | ICD-10-CM

## 2014-11-29 DIAGNOSIS — O152 Eclampsia in the puerperium: Secondary | ICD-10-CM

## 2014-11-29 MED ORDER — TRIAMTERENE-HCTZ 37.5-25 MG PO TABS: 2.0000 | ORAL_TABLET | Freq: Every day | ORAL | Status: DC

## 2014-11-29 MED ORDER — LISINOPRIL 20 MG PO TABS
20.0000 mg | ORAL_TABLET | Freq: Every day | ORAL | Status: DC
  Administered 2014-11-29: 20 mg via ORAL
  Filled 2014-11-29 (×2): qty 1

## 2014-11-29 MED ORDER — BUTALBITAL-APAP-CAFFEINE 50-325-40 MG PO TABS: 2.0000 | ORAL_TABLET | Freq: Four times a day (QID) | ORAL | Status: DC | PRN

## 2014-11-29 MED ORDER — AMLODIPINE BESYLATE 10 MG PO TABS
10.0000 mg | ORAL_TABLET | Freq: Every day | ORAL | Status: DC
  Filled 2014-11-29: qty 1

## 2014-11-29 MED ORDER — SIMETHICONE 80 MG PO CHEW: 80.0000 mg | CHEWABLE_TABLET | ORAL | Status: DC | PRN

## 2014-11-29 MED ORDER — ZOLPIDEM TARTRATE 5 MG PO TABS: 5.0000 mg | ORAL_TABLET | Freq: Every evening | ORAL | Status: DC | PRN

## 2014-11-29 MED ORDER — WITCH HAZEL-GLYCERIN EX PADS: 1.0000 "application " | MEDICATED_PAD | CUTANEOUS | Status: DC | PRN

## 2014-11-29 MED ORDER — OXYCODONE-ACETAMINOPHEN 5-325 MG PO TABS: 2.0000 | ORAL_TABLET | ORAL | Status: DC | PRN

## 2014-11-29 MED ORDER — METOPROLOL TARTRATE 50 MG PO TABS
50.0000 mg | ORAL_TABLET | Freq: Two times a day (BID) | ORAL | Status: DC
  Administered 2014-11-29: 50 mg via ORAL
  Filled 2014-11-29 (×2): qty 1

## 2014-11-29 MED ORDER — OXYCODONE-ACETAMINOPHEN 5-325 MG PO TABS: 1.0000 | ORAL_TABLET | ORAL | Status: DC | PRN

## 2014-11-29 MED ORDER — AMLODIPINE BESYLATE 10 MG PO TABS
10.0000 mg | ORAL_TABLET | Freq: Every day | ORAL | Status: DC
Start: 2014-11-29 — End: 2014-11-29
  Filled 2014-11-29 (×2): qty 1

## 2014-11-29 MED ORDER — LISINOPRIL 20 MG PO TABS: 20.0000 mg | ORAL_TABLET | Freq: Every day | ORAL | Status: DC

## 2014-11-29 MED ORDER — METOPROLOL TARTRATE 100 MG PO TABS
100.0000 mg | ORAL_TABLET | Freq: Two times a day (BID) | ORAL | Status: DC
  Filled 2014-11-29: qty 1

## 2014-11-29 MED ORDER — ONDANSETRON HCL 4 MG PO TABS: 4.0000 mg | ORAL_TABLET | ORAL | Status: DC | PRN

## 2014-11-29 MED ORDER — METOPROLOL TARTRATE 1 MG/ML IV SOLN
5.0000 mg | Freq: Once | INTRAVENOUS | Status: AC
  Administered 2014-11-29: 5 mg via INTRAVENOUS
  Filled 2014-11-29: qty 5

## 2014-11-29 NOTE — Discharge Instructions (Signed)
°Hypertension During Pregnancy °Hypertension is also called high blood pressure. Blood pressure moves blood in your body. Sometimes, the force that moves the blood becomes too strong. When you are pregnant, this condition should be watched carefully. It can cause problems for you and your baby. °HOME CARE  °· Make and keep all of your doctor visits. °· Take medicine as told by your doctor. Tell your doctor about all medicines you take. °· Eat very little salt. °· Exercise regularly. °· Do not drink alcohol. °· Do not smoke. °· Do not have drinks with caffeine. °· Lie on your left side when resting. °· Your health care provider may ask you to take one low-dose aspirin (81mg) each day. °GET HELP RIGHT AWAY IF: °· You have bad belly (abdominal) pain. °· You have sudden puffiness (swelling) in the hands, ankles, or face. °· You gain 4 pounds (1.8 kilograms) or more in 1 week. °· You throw up (vomit) repeatedly. °· You have bleeding from the vagina. °· You do not feel the baby moving as much. °· You have a headache. °· You have blurred or double vision. °· You have muscle twitching or spasms. °· You have shortness of breath. °· You have blue fingernails and lips. °· You have blood in your pee (urine). °MAKE SURE YOU: °· Understand these instructions. °· Will watch your condition. °· Will get help right away if you are not doing well or get worse. °Document Released: 10/25/2010 Document Revised: 02/06/2014 Document Reviewed: 04/21/2013 °ExitCare® Patient Information ©2015 [employer]. This information is not intended to replace advice given to you by your health care provider. Make sure you discuss any questions you have with your health care provider. ° ° °Preeclampsia and Eclampsia °Preeclampsia is a serious condition that develops only during pregnancy. It is also called toxemia of pregnancy. This condition causes high blood pressure along with other symptoms, such as swelling and headaches. These may develop as the  condition gets worse. Preeclampsia may occur 20 weeks or later into your pregnancy.  °Diagnosing and treating preeclampsia early is very important. If not treated early, it can cause serious problems for you and your baby. One problem it can lead to is eclampsia, which is a condition that causes muscle jerking or shaking (convulsions) in the mother. Delivering your baby is the best treatment for preeclampsia or eclampsia.  °RISK FACTORS °The cause of preeclampsia is not known. You may be more likely to develop preeclampsia if you have certain risk factors. These include:  °· Being pregnant for the first time. °· Having preeclampsia in a past pregnancy. °· Having a family history of preeclampsia. °· Having high blood pressure. °· Being pregnant with twins or triplets. °· Being 35 or older. °· Being African American. °· Having kidney disease or diabetes. °· Having medical conditions such as lupus or blood diseases. °· Being very overweight (obese). °SIGNS AND SYMPTOMS  °The earliest signs of preeclampsia are: °· High blood pressure. °· Increased protein in your urine. Your health care provider will check for this at every prenatal visit. °Other symptoms that can develop include:  °· Severe headaches. °· Sudden weight gain. °· Swelling of your hands, face, legs, and feet. °· Feeling sick to your stomach (nauseous) and throwing up (vomiting). °· Vision problems (blurred or double vision). °· Numbness in your face, arms, legs, and feet. °· Dizziness. °· Slurred speech. °· Sensitivity to bright lights. °· Abdominal pain. °DIAGNOSIS  °There are no screening tests for preeclampsia. Your health   care provider will ask you about symptoms and check for signs of preeclampsia during your prenatal visits. You may also have tests, including: °· Urine testing. °· Blood testing. °· Checking your baby's heart rate. °· Checking the health of your baby and your placenta using images created with sound waves (ultrasound). °TREATMENT    °You can work out the best treatment approach together with your health care provider. It is very important to keep all prenatal appointments. If you have an increased risk of preeclampsia, you may need more frequent prenatal exams. °· Your health care provider may prescribe bed rest. °· You may have to eat as little salt as possible. °· You may need to take medicine to lower your blood pressure if the condition does not respond to more conservative measures. °· You may need to stay in the hospital if your condition is severe. There, treatment will focus on controlling your blood pressure and fluid retention. You may also need to take medicine to prevent seizures. °· If the condition gets worse, your baby may need to be delivered early to protect you and the baby. You may have your labor started with medicine (be induced), or you may have a cesarean delivery. °· Preeclampsia usually goes away after the baby is born. °HOME CARE INSTRUCTIONS  °· Only take over-the-counter or prescription medicines as directed by your health care provider. °· Lie on your left side while resting. This keeps pressure off your baby. °· Elevate your feet while resting. °· Get regular exercise. Ask your health care provider what type of exercise is safe for you. °· Avoid caffeine and alcohol. °· Do not smoke. °· Drink 6-8 glasses of water every day. °· Eat a balanced diet that is low in salt. Do not add salt to your food. °· Avoid stressful situations as much as possible. °· Get plenty of rest and sleep. °· Keep all prenatal appointments and tests as scheduled. °SEEK MEDICAL CARE IF: °· You are gaining more weight than expected. °· You have any headaches, abdominal pain, or nausea. °· You are bruising more than usual. °· You feel dizzy or light-headed. °SEEK IMMEDIATE MEDICAL CARE IF:  °· You develop sudden or severe swelling anywhere in your body. This usually happens in the legs. °· You gain 5 lb (2.3 kg) or more in a week. °· You have a  severe headache, dizziness, problems with your vision, or confusion. °· You have severe abdominal pain. °· You have lasting nausea or vomiting. °· You have a seizure. °· You have trouble moving any part of your body. °· You develop numbness in your body. °· You have trouble speaking. °· You have any abnormal bleeding. °· You develop a stiff neck. °· You pass out. °MAKE SURE YOU:  °· Understand these instructions. °· Will watch your condition. °· Will get help right away if you are not doing well or get worse. °Document Released: 09/19/2000 Document Revised: 09/27/2013 Document Reviewed: 07/15/2013 °ExitCare® Patient Information ©2015 [employer]. This information is not intended to replace advice given to you by your health care provider. Make sure you discuss any questions you have with your health care provider. ° °

## 2014-11-29 NOTE — Progress Notes (Signed)
Pt is discharged in the care of Mother with R.N. Escort. Denies any pain or discomfort.  States headaches  Have .improved. No equipment needed for home. Discharge instructions with Rx were given to pt . Questions asked and answered.

## 2014-11-29 NOTE — Discharge Summary (Signed)
Physician Discharge Summary  Patient ID: Monica PeckBriana N Pickard MRN: [id number] DOB/AGE: [birth year] 25 y.o.  Admit date: 11/26/2014 Discharge date: 11/29/2014  Admission Diagnoses:   1.  Preeclampsia in postpartum period Discharge Diagnoses:  Active Problems:   Preeclampsia in postpartum period   Discharged Condition: stable  Hospital Course: Patient was admitted on 2/21 (1 week postpartum) for postpartum preeclampsia with elevated blood pressures and severe unremitting headache. was placed on magnesium sulfate for 24 hours. This was discontinued. The patient was given Fioricet for her headache, which improved it. Patient was transferred out of the AICU on 2/23.  Patient was restarted on her lisinopril and with stable blood pressures sent home.  Consults: None  Significant Diagnostic Studies: none  Treatments: magnesium  Discharge Exam: Blood pressure 161/84, pulse 61, temperature 98.6 F (37 C), temperature source Oral, resp. rate 15, height 5' 3.5" (1.613 m), weight 187 lb 4 oz (84.936 kg), last menstrual period 02/26/2014, SpO2 97 %, unknown if currently breastfeeding. General appearance: alert, cooperative and no distress Head: Normocephalic, without obvious abnormality, atraumatic Resp: clear to auscultation bilaterally Cardio: regular rate and rhythm, S1, S2 normal, no murmur, click, rub or gallop GI: soft, non-tender; bowel sounds normal; no masses,  no organomegaly Extremities: extremities normal, atraumatic, no cyanosis or edema, Homans sign is negative, no sign of DVT and no edema, redness or tenderness in the calves or thighs  Disposition: 01-Home or Self Care     Medication List    ASK your doctor about these medications        albuterol 108 (90 BASE) MCG/ACT inhaler  Commonly known as:  PROVENTIL HFA;VENTOLIN HFA  Inhale 2 puffs into the lungs every 6 (six) hours as needed for wheezing or shortness of breath.     ibuprofen 600 MG tablet  Commonly known as:   ADVIL,MOTRIN  Take 1 tablet (600 mg total) by mouth every 6 (six) hours as needed for cramping.     lisinopril 10 MG tablet  Commonly known as:  PRINIVIL,ZESTRIL  Take one tablet a day     meclizine 32 MG tablet  Commonly known as:  ANTIVERT  Take 1 tablet (32 mg total) by mouth 3 (three) times daily as needed.     pantoprazole 20 MG tablet  Commonly known as:  PROTONIX  Take 1 tablet (20 mg total) by mouth daily.     Prenatal Vitamins 0.8 MG tablet  Take 1 tablet by mouth daily.     sertraline 50 MG tablet  Commonly known as:  ZOLOFT  Take 1 tablet (50 mg total) by mouth daily.     traMADol 50 MG tablet  Commonly known as:  ULTRAM  Take 1 tablet (50 mg total) by mouth every 6 (six) hours as needed.           Follow-up Information    Follow up with [facility] In 2 weeks.   Contact information:   [address] [place] [phone]      Signed: Candelaria CelesteSTINSON, Arletta Lumadue JEHIEL 11/29/2014, 7:53 AM

## 2014-11-29 NOTE — Progress Notes (Signed)
B/p at 0150 171/104, 173/100, 166/135.  Resident notified.  At 0250 Metoprolol 5mg  IV given as ordered with 50mg  po given.  B/p decreased to 161/84.

## 2014-12-18 ENCOUNTER — Ambulatory Visit: Payer: Medicaid Other | Admitting: Women's Health

## 2014-12-19 ENCOUNTER — Ambulatory Visit (INDEPENDENT_AMBULATORY_CARE_PROVIDER_SITE_OTHER): Payer: Medicaid Other | Admitting: Women's Health

## 2014-12-19 ENCOUNTER — Encounter: Payer: Self-pay | Admitting: Women's Health

## 2014-12-19 DIAGNOSIS — Z3202 Encounter for pregnancy test, result negative: Secondary | ICD-10-CM | POA: Diagnosis not present

## 2014-12-19 DIAGNOSIS — Z30011 Encounter for initial prescription of contraceptive pills: Secondary | ICD-10-CM

## 2014-12-19 DIAGNOSIS — F418 Other specified anxiety disorders: Secondary | ICD-10-CM

## 2014-12-19 DIAGNOSIS — L259 Unspecified contact dermatitis, unspecified cause: Secondary | ICD-10-CM

## 2014-12-19 LAB — POCT URINE PREGNANCY: Preg Test, Ur: NEGATIVE

## 2014-12-19 MED ORDER — NORETHIN-ETH ESTRAD-FE BIPHAS 1 MG-10 MCG / 10 MCG PO TABS: 1.0000 | ORAL_TABLET | Freq: Every day | ORAL | Status: DC

## 2014-12-19 MED ORDER — SERTRALINE HCL 50 MG PO TABS: 75.0000 mg | ORAL_TABLET | Freq: Every day | ORAL | Status: DC

## 2014-12-19 NOTE — Progress Notes (Signed)
Patient ID: Monica Cox, female   DOB: [birth year], 24 y.o.   MRN: [id number] Subjective:    Monica Cox is a 24 y.o. 823P2012 African American female who presents for a postpartum visit. She is 4 weeks postpartum following a spontaneous vaginal delivery at 38.0 gestational weeks. Anesthesia: epidural. I have fully reviewed the prenatal and intrapartum course. Postpartum course has been complicated by readmission at 1wk pp for pp pre-e and received mag. She was sent home w/ lisinopril 20mg  daily, and she stopped it on her own 2 days ago. Baby's course has been uncomplicated. Baby is feeding by bottle. Bleeding no bleeding. Bowel function is normal. Bladder function is normal. Patient is not sexually active. Last sexual activity: prior to birth of baby. Contraception method is abstinence and wants COCs. Does not smoke, no h/o HTN, DVT/PE, CVA, MI, or migraines w/ aura. Postpartum depression screening: positive. Score 17.  Is currently taking zoloft 50mg  daily that was rx'd at d/c from hospital when baby was born. States she is mainly just anxious, not depressed at all- zoloft has helped some but still feels anxious a lot. Denies SI/HI/II. Discussed giving current zoloft dosage a little more time, or increasing dosage- she would like to increase dosage. Declines counseling at this time- states she has a great support group. Last pap 04/26/14 and was neg. Itchy bumps on feet x few weeks.   The following portions of the patient's history were reviewed and updated as appropriate: allergies, current medications, past medical history, past surgical history and problem list.  Review of Systems Pertinent items are noted in HPI.   Filed Vitals:   12/19/14 1523  BP: 134/78  Pulse: 72  Weight: 179 lb (81.194 kg)   No LMP recorded.  Objective:   General:  alert, cooperative and no distress   Breasts:  deferred, no complaints  Lungs: clear to auscultation bilaterally  Heart:  regular rate and rhythm   Abdomen: soft, nontender   Vulva: normal  Vagina: normal vagina  Cervix:  closed  Corpus: Well-involuted  Adnexa:  Non-palpable  Rectal Exam: No hemorrhoids       Extremities: few small flesh colored bumps on bilateral feet Assessment:   Postpartum exam 4 wks s/p SVB REadmitted 1wk pp for pp pre-e Anxiety/depression on zoloft Contact dermatitis on bilateral feet bottlefeeding Depression screening Contraception counseling   Plan:  Increase zoloft to 75mg  daily, let us know if not helping Hydrocortisone cream prn feet Contraception: rx lo loestrin w/ 11RF Follow up in: 3 months for coc f/u or earlier if needed  [name], Geanine Vandekamp Randall CNM, [facility] 12/19/2014 3:43 PM

## 2014-12-19 NOTE — Patient Instructions (Signed)
'If We're Honest' by Urbano Heir  Hydrocortisone cream for bumps on feet  Increase zoloft to  daily, let us know if it's not helping   Oral Contraception Use Oral contraceptive pills (OCPs) are medicines taken to prevent pregnancy. OCPs work by preventing the ovaries from releasing eggs. The hormones in OCPs also cause the cervical mucus to thicken, preventing the sperm from entering the uterus. The hormones also cause the uterine lining to become thin, not allowing a fertilized egg to attach to the inside of the uterus. OCPs are highly effective when taken exactly as prescribed. However, OCPs do not prevent sexually transmitted diseases (STDs). Safe sex practices, such as using condoms along with an OCP, can help prevent STDs. Before taking OCPs, you may have a physical exam and Pap test. Your health care provider may also order blood tests if necessary. Your health care provider will make sure you are a good candidate for oral contraception. Discuss with your health care provider the possible side effects of the OCP you may be prescribed. When starting an OCP, it can take 2 to 3 months for the body to adjust to the changes in hormone levels in your body.  HOW TO TAKE ORAL CONTRACEPTIVE PILLS Your health care provider may advise you on how to start taking the first cycle of OCPs. Otherwise, you can:   Start on day 1 of your menstrual period. You will not need any backup contraceptive protection with this start time.   Start on the first Sunday after your menstrual period or the day you get your prescription. In these cases, you will need to use backup contraceptive protection for the first week.   Start the pill at any time of your cycle. If you take the pill within 5 days of the start of your period, you are protected against pregnancy right away. In this case, you will not need a backup form of birth control. If you start at any other time of your menstrual cycle, you will need to  use another form of birth control for 7 days. If your OCP is the type called a minipill, it will protect you from pregnancy after taking it for 2 days (48 hours). After you have started taking OCPs:   If you forget to take 1 pill, take it as soon as you remember. Take the next pill at the regular time.   If you miss 2 or more pills, call your health care provider because different pills have different instructions for missed doses. Use backup birth control until your next menstrual period starts.   If you use a 28-day pack that contains inactive pills and you miss 1 of the last 7 pills (pills with no hormones), it will not matter. Throw away the rest of the non-hormone pills and start a new pill pack.  No matter which day you start the OCP, you will always start a new pack on that same day of the week. Have an extra pack of OCPs and a backup contraceptive method available in case you miss some pills or lose your OCP pack.  HOME CARE INSTRUCTIONS   Do not smoke.   Always use a condom to protect against STDs. OCPs do not protect against STDs.   Use a calendar to mark your menstrual period days.   Read the information and directions that came with your OCP. Talk to your health care provider if you have questions.  SEEK MEDICAL CARE IF:   You develop nausea and  vomiting.   You have abnormal vaginal discharge or bleeding.   You develop a rash.   You miss your menstrual period.   You are losing your hair.   You need treatment for mood swings or depression.   You get dizzy when taking the OCP.   You develop acne from taking the OCP.   You become pregnant.  SEEK IMMEDIATE MEDICAL CARE IF:   You develop chest pain.   You develop shortness of breath.   You have an uncontrolled or severe headache.   You develop numbness or slurred speech.   You develop visual problems.   You develop pain, redness, and swelling in the legs.  Document Released: 09/11/2011  Document Revised: 02/06/2014 Document Reviewed: 03/13/2013 [facility] Patient Information 2015 [employer] [place]. This information is not intended to replace advice given to you by your health care provider. Make sure you discuss any questions you have with your health care provider.

## 2014-12-21 ENCOUNTER — Ambulatory Visit: Payer: Medicaid Other | Admitting: Advanced Practice Midwife

## 2015-01-15 ENCOUNTER — Telehealth: Payer: Self-pay | Admitting: Women's Health

## 2015-01-15 NOTE — Telephone Encounter (Signed)
Pt states she is having irregular periods and is wanting a medication sent to [facility] in [place] to help stop the bleeding.  Pt states she stopped bleeding a 6 wks PP then a about a week later had a heavy period then stopped bleeding for 7 days and then started bleeding again yesterday.  She states the bleeding is heavy, having to change a regular tampon every hour, she states she is anemic and feeling very tired.  She is taking OCP and is about 3 weeks into this pack on pills. Please advise.

## 2015-01-15 NOTE — Telephone Encounter (Signed)
Pt informed of need for office visit and call transferred to front staff for appointment to be made.

## 2015-01-16 ENCOUNTER — Encounter: Payer: Self-pay | Admitting: Advanced Practice Midwife

## 2015-01-16 ENCOUNTER — Ambulatory Visit (INDEPENDENT_AMBULATORY_CARE_PROVIDER_SITE_OTHER): Payer: Medicaid Other | Admitting: Obstetrics and Gynecology

## 2015-01-16 VITALS — BP 128/78 | HR 76 | Ht 63.5 in | Wt 171.2 lb

## 2015-01-16 DIAGNOSIS — N921 Excessive and frequent menstruation with irregular cycle: Secondary | ICD-10-CM

## 2015-01-16 MED ORDER — ESTRADIOL 1 MG PO TABS: 1.0000 mg | ORAL_TABLET | Freq: Two times a day (BID) | ORAL | Status: DC
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
## 2015-02-19 ENCOUNTER — Ambulatory Visit: Payer: Medicaid Other | Admitting: Obstetrics and Gynecology

## 2015-03-21 ENCOUNTER — Ambulatory Visit: Payer: Medicaid Other | Admitting: Women's Health

## 2015-04-02 ENCOUNTER — Ambulatory Visit: Payer: Medicaid Other | Admitting: Women's Health

## 2015-04-03 ENCOUNTER — Encounter: Payer: Self-pay | Admitting: Women's Health

## 2015-04-03 ENCOUNTER — Ambulatory Visit (INDEPENDENT_AMBULATORY_CARE_PROVIDER_SITE_OTHER): Payer: Medicaid Other | Admitting: Women's Health

## 2015-04-03 VITALS — BP 132/80 | Ht 63.5 in | Wt 176.0 lb

## 2015-04-03 DIAGNOSIS — Z113 Encounter for screening for infections with a predominantly sexual mode of transmission: Secondary | ICD-10-CM

## 2015-04-03 NOTE — Progress Notes (Signed)
Patient ID: Monica Cox, female   DOB: 07/26/1991, 24 y.o.   MRN: [id number]   [facility]  Patient name: Monica Cox MRN [id number]  Date of birth: 07/26/1991  CC & HPI:  Monica Cox is a 24 y.o. African American female presenting today for STD screen. Found out partner texting and 'visiting' other women, just wants to be checked to be on safe side. No sx.   Pertinent History Reviewed:  Medical & Surgical Hx:   Past Medical History  Diagnosis Date  . Asthma   . Reflux   . Pregnant   . Vaginal Pap smear, abnormal   . Anemia   . Trichomonas infection 08-08-2014   Past Surgical History  Procedure Laterality Date  . None    . Esophagogastroduodenoscopy endoscopy    . Dilation and curettage of uterus      [facility] Dr. Despina HiddenEure performed   Medications: Reviewed & Updated - see associated section Social History: Reviewed -  reports that she has been smoking Cigars.  She has never used smokeless tobacco.  Objective Findings:  Vitals: BP 132/80 mmHg  Ht 5' 3.5" (1.613 m)  Wt 176 lb (79.833 kg)  BMI 30.68 kg/m2  Breastfeeding? No  Physical Examination: General appearance - alert, well appearing, and in no distress  No results found for this or any previous visit (from the past 24 hour(s)).   Assessment & Plan:  A:   STD screen P:  GC/CT from urine, HIV, RPR, HSV2, HepB&C today   F/U 3mths for physical   [name], Promise Bushong Randall CNM, [facility]NP-BC 04/03/2015 2:56 PM

## 2015-04-03 NOTE — Progress Notes (Signed)
Patient ID: Monica Cox, female   DOB: July 02, 1991, 24 y.o.   MRN: [id number] Pt here today for STD screening. Pt denies any symptoms but wants to be checked.

## 2015-04-04 ENCOUNTER — Telehealth: Payer: Self-pay | Admitting: Women's Health

## 2015-04-04 LAB — HEPATITIS C ANTIBODY

## 2015-04-04 LAB — HEPATITIS B SURFACE ANTIGEN: Hepatitis B Surface Ag: NEGATIVE

## 2015-04-04 LAB — HIV ANTIBODY (ROUTINE TESTING W REFLEX): HIV SCREEN 4TH GENERATION: NONREACTIVE

## 2015-04-04 LAB — RPR: RPR Ser Ql: NONREACTIVE

## 2015-04-04 LAB — HSV 2 ANTIBODY, IGG

## 2015-04-04 NOTE — Telephone Encounter (Signed)
Pt informed of all negative blood results but was informed the GC/CHL was not resulted and after speaking with Willy EddyAshley Travis who was working with Joellyn HaffKim Booker yesterday she believes the urine spec was discarded and not sent to the lab.  The pt was informed she does not need to be seen again just [facility], pt states she can not get by here during their office hours but would like to send a spec in with her mother in the morning, I informed her she could void at home and have her mom take the spec to [facility].  Pt verbalized understanding.

## 2015-04-05 ENCOUNTER — Encounter: Payer: Self-pay | Admitting: Obstetrics and Gynecology

## 2015-04-05 ENCOUNTER — Ambulatory Visit (INDEPENDENT_AMBULATORY_CARE_PROVIDER_SITE_OTHER): Payer: Medicaid Other | Admitting: Obstetrics and Gynecology

## 2015-04-05 VITALS — BP 120/80 | HR 76 | Ht 63.5 in | Wt 175.0 lb

## 2015-04-05 DIAGNOSIS — F419 Anxiety disorder, unspecified: Secondary | ICD-10-CM | POA: Diagnosis not present
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
## 2015-04-06 ENCOUNTER — Telehealth: Payer: Self-pay | Admitting: Obstetrics and Gynecology

## 2015-04-06 LAB — GC/CHLAMYDIA PROBE AMP
Chlamydia trachomatis, NAA: NEGATIVE
NEISSERIA GONORRHOEAE BY PCR: NEGATIVE

## 2015-04-06 NOTE — Telephone Encounter (Signed)
Pt informed GC/CHL still pending.

## 2015-04-10 ENCOUNTER — Telehealth: Payer: Self-pay | Admitting: *Deleted

## 2015-04-10 NOTE — Telephone Encounter (Signed)
Pt informed of negative GC/CHL from 04/05/2015.

## 2015-04-12 ENCOUNTER — Telehealth: Payer: Self-pay | Admitting: *Deleted

## 2015-04-12 NOTE — Telephone Encounter (Signed)
Left message letting pt know all results were normal. JSY

## 2015-06-20 ENCOUNTER — Ambulatory Visit (INDEPENDENT_AMBULATORY_CARE_PROVIDER_SITE_OTHER): Payer: Medicaid Other | Admitting: Women's Health

## 2015-06-20 ENCOUNTER — Encounter: Payer: Self-pay | Admitting: Women's Health

## 2015-06-20 VITALS — BP 120/60 | HR 72 | Wt 170.0 lb

## 2015-06-20 DIAGNOSIS — F430 Acute stress reaction: Principal | ICD-10-CM

## 2015-06-20 DIAGNOSIS — Z113 Encounter for screening for infections with a predominantly sexual mode of transmission: Secondary | ICD-10-CM

## 2015-06-20 DIAGNOSIS — F410 Panic disorder [episodic paroxysmal anxiety] without agoraphobia: Secondary | ICD-10-CM | POA: Diagnosis not present

## 2015-06-20 MED ORDER — ALBUTEROL SULFATE HFA 108 (90 BASE) MCG/ACT IN AERS: 2.0000 | INHALATION_SPRAY | Freq: Four times a day (QID) | RESPIRATORY_TRACT | Status: DC | PRN

## 2015-06-20 NOTE — Progress Notes (Signed)
Patient ID: Monica Cox, female   DOB: [birth year], 24 y.o.   MRN: [id number]   [facility] Visit  Patient name: Monica Cox MRN [id number]  Date of birth: 04/20/1991  CC & HPI:  Monica Cox is a 24 y.o. G59P2012 African American female presenting today for report of panic attacks and wanting std screen.  Saw Dr. Emelda Fear 04/05/15 for panic attacks- recommended counseling [facility]/ [facility]- pt never went- talked to her mother which isn't helping. Panic attacks happen q 5-7days, at times when she feels stressed. States her hands will become numb, she feels like someone is sitting on her chest. Pass on their own. Reports passing out the other day and hitting head on tub. No h/o depression/anxiety other mental health issues other than mild depression earlier pregnancy-she was rx'd lexapro and referral to [facility] sent- but she never did either- and depression resolved on it's own. Has PCP Dr. Felecia Shelling but states 'he never know's what is going on' and wants new PCP.  Requests refill on inhaler, states she uses qod Has new sex partner x 3 months, wants full STD screening. Was just screened in June for all STDs and was neg. Patient's last menstrual period was 06/12/2015. The current method of family planning is OCP (estrogen/progesterone). Last pap 2015  Pertinent History Reviewed:  Medical & Surgical Hx:   Past Medical History  Diagnosis Date  . Asthma   . Reflux   . Pregnant   . Vaginal Pap smear, abnormal   . Anemia   . Trichomonas infection 08-08-2014   Past Surgical History  Procedure Laterality Date  . None    . Esophagogastroduodenoscopy endoscopy    . Dilation and curettage of uterus      MAB Dr. Despina Hidden performed   Medications: Reviewed & Updated - see associated section Social History: Reviewed -  reports that she has been smoking Cigars.  She has never used smokeless tobacco.  Objective Findings:  Vitals: BP 120/60 mmHg  Pulse 72  Wt 170 lb  (77.111 kg)  LMP 06/12/2015 Body mass index is 29.64 kg/(m^2).  Physical Examination: General appearance - alert, well appearing, and in no distress  No results found for this or any previous visit (from the past 24 hour(s)).   Assessment & Plan:  A:   Panic attacks, stress related  STD screen  P:  Advised stress relieving activities, walking/exercising/warm baths/breathing exercises/reading/prayer  Encouraged seeing counselor at [facility] in [facility], agreed- referral faxed  GC/CT from urine, HIV, RPR, HSV2, Hep B today  Condoms always for STD prevention  Refilled inhaler per her request  To find new PCP if she is not satisfied w/ hers  Return in about 1 year (around 06/19/2016) for physical.  Marge Duncans CNM, WHNP-BC 06/20/2015 5:01 PM

## 2015-06-21 LAB — HSV 2 ANTIBODY, IGG: HSV 2 Glycoprotein G Ab, IgG: <0.91 index (ref 0.00–0.90)

## 2015-06-21 LAB — HIV ANTIBODY (ROUTINE TESTING W REFLEX): HIV SCREEN 4TH GENERATION: NONREACTIVE

## 2015-06-21 LAB — HEPATITIS B SURFACE ANTIGEN: Hepatitis B Surface Ag: NEGATIVE

## 2015-06-21 LAB — RPR: RPR Ser Ql: NONREACTIVE

## 2015-06-22 LAB — GC/CHLAMYDIA PROBE AMP
Chlamydia trachomatis, NAA: NEGATIVE
Neisseria gonorrhoeae by PCR: NEGATIVE

## 2015-07-30 ENCOUNTER — Encounter (HOSPITAL_COMMUNITY): Payer: Self-pay | Admitting: *Deleted

## 2015-07-30 ENCOUNTER — Emergency Department (HOSPITAL_COMMUNITY)
Admission: EM | Admit: 2015-07-30 | Discharge: 2015-07-30 | Disposition: A | Discharge status: Home / Self Care | Payer: Medicaid Other | Attending: Emergency Medicine | Admitting: Emergency Medicine

## 2015-07-30 DIAGNOSIS — Z79899 Other long term (current) drug therapy: Secondary | ICD-10-CM | POA: Diagnosis not present

## 2015-07-30 DIAGNOSIS — K0889 Other specified disorders of teeth and supporting structures: Secondary | ICD-10-CM | POA: Insufficient documentation

## 2015-07-30 DIAGNOSIS — Z720 Tobacco use: Secondary | ICD-10-CM | POA: Insufficient documentation

## 2015-07-30 DIAGNOSIS — Z8719 Personal history of other diseases of the digestive system: Secondary | ICD-10-CM | POA: Insufficient documentation

## 2015-07-30 DIAGNOSIS — Z8619 Personal history of other infectious and parasitic diseases: Secondary | ICD-10-CM | POA: Insufficient documentation

## 2015-07-30 DIAGNOSIS — J45909 Unspecified asthma, uncomplicated: Secondary | ICD-10-CM | POA: Insufficient documentation

## 2015-07-30 DIAGNOSIS — Z862 Personal history of diseases of the blood and blood-forming organs and certain disorders involving the immune mechanism: Secondary | ICD-10-CM | POA: Insufficient documentation

## 2015-07-30 MED ORDER — PENICILLIN V POTASSIUM 500 MG PO TABS: 500.0000 mg | ORAL_TABLET | Freq: Four times a day (QID) | ORAL | Status: AC

## 2015-07-30 MED ORDER — PENICILLIN V POTASSIUM 250 MG PO TABS
500.0000 mg | ORAL_TABLET | Freq: Once | ORAL | Status: AC
  Administered 2015-07-30: 500 mg via ORAL
  Filled 2015-07-30 (×2): qty 2

## 2015-07-30 MED ORDER — IBUPROFEN 600 MG PO TABS: 600.0000 mg | ORAL_TABLET | Freq: Four times a day (QID) | ORAL | Status: DC | PRN

## 2015-07-30 MED ORDER — IBUPROFEN 800 MG PO TABS
800.0000 mg | ORAL_TABLET | Freq: Once | ORAL | Status: AC
  Administered 2015-07-30: 800 mg via ORAL
  Filled 2015-07-30: qty 1

## 2015-07-30 NOTE — ED Provider Notes (Signed)
CSN: [id number]     Arrival date & time 07/30/15  1951 History  By signing my name below, I, Lyndel Safe, attest that this documentation has been prepared under the direction and in the presence of Zadie Rhine, MD. Electronically Signed: Lyndel Safe, ED Scribe. 07/30/2015. 8:24 PM.   Chief Complaint  Patient presents with  . Dental Pain   Patient is a 24 y.o. female presenting with tooth pain. The history is provided by the patient. No language interpreter was used.  Dental Pain Location:  Lower Lower teeth location: Left. Quality:  Localized Onset quality:  Sudden Duration:  2 days Timing:  Constant Progression:  Worsening Chronicity:  New Relieved by:  None tried Worsened by:  Jaw movement Ineffective treatments:  None tried Associated symptoms: no fever    HPI Comments: Monica Cox is a 25 y.o. female, with no pertinent PMhx, who presents to the Emergency Department complaining of sudden onset, constant left lower dental pain that began 2 days ago but worsened today with eating. She reports her mother visualized swelling in the affected area. Denies fevers, chills, nausea or vomiting. She is not followed by a dentist.   Past Medical History  Diagnosis Date  . Asthma   . Reflux   . Pregnant   . Vaginal Pap smear, abnormal   . Anemia   . Trichomonas infection 08-08-2014   Past Surgical History  Procedure Laterality Date  . None    . Esophagogastroduodenoscopy endoscopy    . Dilation and curettage of uterus      MAB Dr. Despina Hidden performed   Family History  Problem Relation Age of Onset  . Ulcers Father   . Hypertension Father   . Ulcers Paternal Grandmother   . Diabetes Paternal Grandmother   . Ulcers Paternal Aunt   . Liver disease Neg Hx   . Colon cancer Neg Hx   . Hypertension Mother    Social History  Substance Use Topics  . Smoking status: Current Some Day Smoker    Types: Cigars  . Smokeless tobacco: Never Used  . Alcohol Use: 0.0 oz/week     0 Standard drinks or equivalent per week     Comment: occasionally   OB History    Gravida Para Term Preterm AB TAB SAB Ectopic Multiple Living   0 1 0 1 0 0 2     Review of Systems  Constitutional: Negative for fever and chills.  HENT: Positive for dental problem.    Allergies  Review of patient's allergies indicates no known allergies.  Home Medications   Prior to Admission medications   Medication Sig Start Date End Date Taking? Authorizing Provider  albuterol (PROVENTIL HFA;VENTOLIN HFA) 108 (90 BASE) MCG/ACT inhaler Inhale 2 puffs into the lungs every 6 (six) hours as needed for wheezing or shortness of breath. Patient taking differently: Inhale 1-2 puffs into the lungs every 6 (six) hours as needed for wheezing or shortness of breath.  06/20/15  Yes Cheral Marker, CNM   BP 116/66 mmHg  Pulse 90  Temp(Src) 98.2 F (36.8 C) (Oral)  Resp 20  Ht 5' 3.5" (1.613 m)  Wt 173 lb 8 oz (78.699 kg)  BMI 30.25 kg/m2  SpO2 100%  LMP 07/09/2015 Physical Exam CONSTITUTIONAL: Well developed/well nourished HEAD AND FACE: Normocephalic/atraumatic EYES: EOMI/PERRL ENMT: Mucous membranes moist.  Poor dentition.  No trismus.  No focal abscess noted. Tenderness to left, lower molar.  NECK: supple no meningeal signs CV: S1/S2  noted, no murmurs/rubs/gallops noted LUNGS: Lungs are clear to auscultation bilaterally, no apparent distress ABDOMEN: soft, nontender, no rebound or guarding NEURO: Pt is awake/alert, moves all extremitiesx4 EXTREMITIES:full ROM SKIN: warm, color normal ED Course  Procedures  DIAGNOSTIC STUDIES: Oxygen Saturation is 100% on RA, normal by my interpretation.    COORDINATION OF CARE: 8:21 PM Discussed treatment plan with pt at bedside and pt agreed to plan. Will order and prescribe penicillin and give dental referral.     MDM   Final diagnoses:  Pain, dental    Nursing notes including past medical history and social history reviewed and  considered in documentation   I personally performed the services described in this documentation, which was scribed in my presence. The recorded information has been reviewed and is accurate.       Zadie Rhineonald Wilburn Keir, MD 07/30/15 2038

## 2015-07-30 NOTE — ED Notes (Signed)
Pt c/o left lower dental pain; pt states she thinks it's an abscess

## 2015-08-23 ENCOUNTER — Encounter (HOSPITAL_COMMUNITY): Payer: Self-pay | Admitting: Emergency Medicine

## 2015-08-23 ENCOUNTER — Emergency Department (HOSPITAL_COMMUNITY)
Admission: EM | Admit: 2015-08-23 | Discharge: 2015-08-23 | Disposition: A | Discharge status: Home / Self Care | Payer: Medicaid Other | Attending: Emergency Medicine | Admitting: Emergency Medicine

## 2015-08-23 ENCOUNTER — Emergency Department (HOSPITAL_COMMUNITY): Payer: Medicaid Other

## 2015-08-23 DIAGNOSIS — Z8619 Personal history of other infectious and parasitic diseases: Secondary | ICD-10-CM | POA: Insufficient documentation

## 2015-08-23 DIAGNOSIS — R52 Pain, unspecified: Secondary | ICD-10-CM

## 2015-08-23 DIAGNOSIS — N83201 Unspecified ovarian cyst, right side: Secondary | ICD-10-CM | POA: Diagnosis not present

## 2015-08-23 DIAGNOSIS — N201 Calculus of ureter: Secondary | ICD-10-CM | POA: Insufficient documentation

## 2015-08-23 DIAGNOSIS — N23 Unspecified renal colic: Secondary | ICD-10-CM | POA: Insufficient documentation

## 2015-08-23 DIAGNOSIS — Z3202 Encounter for pregnancy test, result negative: Secondary | ICD-10-CM | POA: Insufficient documentation

## 2015-08-23 DIAGNOSIS — J45909 Unspecified asthma, uncomplicated: Secondary | ICD-10-CM | POA: Insufficient documentation

## 2015-08-23 DIAGNOSIS — B9689 Other specified bacterial agents as the cause of diseases classified elsewhere: Secondary | ICD-10-CM

## 2015-08-23 DIAGNOSIS — F17210 Nicotine dependence, cigarettes, uncomplicated: Secondary | ICD-10-CM | POA: Diagnosis not present

## 2015-08-23 DIAGNOSIS — Z79899 Other long term (current) drug therapy: Secondary | ICD-10-CM | POA: Diagnosis not present

## 2015-08-23 DIAGNOSIS — R102 Pelvic and perineal pain: Secondary | ICD-10-CM

## 2015-08-23 DIAGNOSIS — R109 Unspecified abdominal pain: Secondary | ICD-10-CM | POA: Diagnosis present

## 2015-08-23 DIAGNOSIS — Z862 Personal history of diseases of the blood and blood-forming organs and certain disorders involving the immune mechanism: Secondary | ICD-10-CM | POA: Insufficient documentation

## 2015-08-23 DIAGNOSIS — N760 Acute vaginitis: Secondary | ICD-10-CM | POA: Diagnosis not present

## 2015-08-23 DIAGNOSIS — Z8719 Personal history of other diseases of the digestive system: Secondary | ICD-10-CM | POA: Diagnosis not present

## 2015-08-23 DIAGNOSIS — N83209 Unspecified ovarian cyst, unspecified side: Secondary | ICD-10-CM

## 2015-08-23 LAB — URINE MICROSCOPIC-ADD ON

## 2015-08-23 LAB — URINALYSIS, ROUTINE W REFLEX MICROSCOPIC
Glucose, UA: NEGATIVE mg/dL
KETONES UR: NEGATIVE mg/dL
NITRITE: POSITIVE — AB
Protein, ur: 100 mg/dL — AB
Specific Gravity, Urine: >1.03 — ABNORMAL HIGH (ref 1.005–1.030)
pH: 6.5 (ref 5.0–8.0)

## 2015-08-23 LAB — WET PREP, GENITAL
Sperm: NONE SEEN
Trich, Wet Prep: NONE SEEN
Yeast Wet Prep HPF POC: NONE SEEN

## 2015-08-23 LAB — PREGNANCY, URINE: PREG TEST UR: NEGATIVE

## 2015-08-23 MED ORDER — ONDANSETRON HCL 4 MG PO TABS: 4.0000 mg | ORAL_TABLET | Freq: Three times a day (TID) | ORAL | Status: DC | PRN

## 2015-08-23 MED ORDER — NAPROXEN 250 MG PO TABS: 250.0000 mg | ORAL_TABLET | Freq: Two times a day (BID) | ORAL | Status: DC

## 2015-08-23 MED ORDER — METRONIDAZOLE 500 MG PO TABS: 500.0000 mg | ORAL_TABLET | Freq: Two times a day (BID) | ORAL | Status: DC

## 2015-08-23 MED ORDER — KETOROLAC TROMETHAMINE 60 MG/2ML IM SOLN
60.0000 mg | Freq: Once | INTRAMUSCULAR | Status: AC
  Administered 2015-08-23: 60 mg via INTRAMUSCULAR
  Filled 2015-08-23: qty 2

## 2015-08-23 MED ORDER — HYDROCODONE-ACETAMINOPHEN 5-325 MG PO TABS: ORAL_TABLET | ORAL | Status: DC

## 2015-08-23 NOTE — ED Provider Notes (Signed)
CSN: [id number]     Arrival date & time 08/23/15  1446 History   First MD Initiated Contact with Patient 08/23/15 1835     Chief Complaint  Patient presents with  . Abdominal Pain      HPI  Pt was seen at 1845. Per pt, c/o sudden onset and persistence of constant right sided abd/pelvic "pain" that began at 10am this morning. Describes the pain as "aching." Has been associated with nausea and hematuria. Denies flank pain, no dysuria, no vaginal bleeding/discharge, no vomiting/diarrhea, no black or blood in stools, no CP/SOB, no fevers, no rash.    OB/GYN: Dr. Emelda Fear Past Medical History  Diagnosis Date  . Asthma   . Reflux   . Vaginal Pap smear, abnormal   . Anemia   . Trichomonas infection 08-08-2014   Past Surgical History  Procedure Laterality Date  . Esophagogastroduodenoscopy endoscopy    . Dilation and curettage of uterus      MAB Dr. Despina Hidden performed   Family History  Problem Relation Age of Onset  . Ulcers Father   . Hypertension Father   . Ulcers Paternal Grandmother   . Diabetes Paternal Grandmother   . Ulcers Paternal Aunt   . Liver disease Neg Hx   . Colon cancer Neg Hx   . Hypertension Mother    Social History  Substance Use Topics  . Smoking status: Current Some Day Smoker    Types: Cigars  . Smokeless tobacco: Never Used  . Alcohol Use: 0.0 oz/week    0 Standard drinks or equivalent per week     Comment: occasionally   OB History    Gravida Para Term Preterm AB TAB SAB Ectopic Multiple Living   0 1 0 1 0 0 2     Review of Systems ROS: Statement: All systems negative except as marked or noted in the HPI; Constitutional: Negative for fever and chills. ; ; Eyes: Negative for eye pain, redness and discharge. ; ; ENMT: Negative for ear pain, hoarseness, nasal congestion, sinus pressure and sore throat. ; ; Cardiovascular: Negative for chest pain, palpitations, diaphoresis, dyspnea and peripheral edema. ; ; Respiratory: Negative for cough, wheezing  and stridor. ; ; Gastrointestinal: +abd pain, nausea. Negative for vomiting, diarrhea, blood in stool, hematemesis, jaundice and rectal bleeding. . ; ; Genitourinary: Negative for dysuria, flank pain. +hematuria. ; ; GYN:  No vaginal bleeding, no vaginal discharge, no vulvar pain. ;; Musculoskeletal: Negative for back pain and neck pain. Negative for swelling and trauma.; ; Skin: Negative for pruritus, rash, abrasions, blisters, bruising and skin lesion.; ; Neuro: Negative for headache, lightheadedness and neck stiffness. Negative for weakness, altered level of consciousness , altered mental status, extremity weakness, paresthesias, involuntary movement, seizure and syncope.      Allergies  Review of patient's allergies indicates no known allergies.  Home Medications   Prior to Admission medications   Medication Sig Start Date End Date Taking? Authorizing Provider  albuterol (PROVENTIL HFA;VENTOLIN HFA) 108 (90 BASE) MCG/ACT inhaler Inhale 2 puffs into the lungs every 6 (six) hours as needed for wheezing or shortness of breath. Patient taking differently: Inhale 1-2 puffs into the lungs every 6 (six) hours as needed for wheezing or shortness of breath.  06/20/15   Cheral Marker, CNM  ibuprofen (ADVIL,MOTRIN) 600 MG tablet Take 1 tablet (600 mg total) by mouth every 6 (six) hours as needed. 07/30/15   Zadie Rhine, MD   BP 125/66 mmHg  Pulse 90  Temp(Src) 98.1 F (36.7 C) (Oral)  Resp 16  Ht  (1.6 m)  Wt 173 lb (78.472 kg)  BMI 30.65 kg/m2  SpO2 100%  LMP 08/15/2015 Physical Exam  1850: Physical examination:  Nursing notes reviewed; Vital signs and O2 SAT reviewed;  Constitutional: Well developed, Well nourished, Well hydrated, Uncomfortable appearing.; Head:  Normocephalic, atraumatic; Eyes: EOMI, PERRL, No scleral icterus; ENMT: Mouth and pharynx normal, Mucous membranes moist; Neck: Supple, Full range of motion, No lymphadenopathy; Cardiovascular: Regular rate and rhythm, No  murmur, rub, or gallop; Respiratory: Breath sounds clear & equal bilaterally, No rales, rhonchi, wheezes.  Speaking full sentences with ease, Normal respiratory effort/excursion; Chest: Nontender, Movement normal; Abdomen: Soft, +RLQ tenderness to palp. No rebound or guarding. Nondistended, Normal bowel sounds; Genitourinary: No CVA tenderness. Pelvic exam performed with permission of pt and female ED RN assist during exam.  External genitalia w/o lesions. Vaginal vault with thin white discharge.  Cervix w/o lesions, not friable, GC/chlam and wet prep obtained and sent to lab.  Bimanual exam w/o CMT, uterine or left adnexal tenderness. +right pelvic tenderness.; Spine:  No midline CS, TS, LS tenderness.;; Extremities: Pulses normal, No tenderness, No edema, No calf edema or asymmetry.; Neuro: AA&Ox3, Major CN grossly intact.  Speech clear. No gross focal motor or sensory deficits in extremities.; Skin: Color normal, Warm, Dry.   ED Course  Procedures (including critical care time) Labs Review   Imaging Review  I have personally reviewed and evaluated these images and lab results as part of my medical decision-making.   EKG Interpretation None      MDM  MDM Reviewed: previous chart, nursing note and vitals Reviewed previous: labs Interpretation: labs, ultrasound and CT scan     Results for orders placed or performed during the hospital encounter of 08/23/15  Wet prep, genital  Result Value Ref Range   Yeast Wet Prep HPF POC NONE SEEN NONE SEEN   Trich, Wet Prep NONE SEEN NONE SEEN   Clue Cells Wet Prep HPF POC PRESENT (A) NONE SEEN   WBC, Wet Prep HPF POC FEW (A) NONE SEEN   Sperm NONE SEEN   Urinalysis, Routine w reflex microscopic (not at [facility])  Result Value Ref Range   Color, Urine BROWN (A) YELLOW   APPearance HAZY (A) CLEAR   Specific Gravity, Urine >1.030 (H) 1.005 - 1.030   pH 6.5 5.0 - 8.0   Glucose, UA NEGATIVE NEGATIVE mg/dL   Hgb urine dipstick LARGE (A) NEGATIVE    Bilirubin Urine MODERATE (A) NEGATIVE   Ketones, ur NEGATIVE NEGATIVE mg/dL   Protein, ur 161 (A) NEGATIVE mg/dL   Nitrite POSITIVE (A) NEGATIVE   Leukocytes, UA TRACE (A) NEGATIVE  Pregnancy, urine  Result Value Ref Range   Preg Test, Ur NEGATIVE NEGATIVE  Urine microscopic-add on  Result Value Ref Range   Squamous Epithelial / LPF 0-5 (A) NONE SEEN   WBC, UA 0-5 0 - 5 WBC/hpf   RBC / HPF TOO NUMEROUS TO COUNT 0 - 5 RBC/hpf   Bacteria, UA MANY (A) NONE SEEN   Ct Renal Stone Study 08/23/2015  ADDENDUM REPORT: 08/23/2015 19:00 ADDENDUM: It should be noted the appendix is within normal limits. Electronically Signed   By: Alcide Clever M.D.   On: 08/23/2015 19:00  08/23/2015  CLINICAL DATA:  Right-sided abdominal pain and hematuria EXAM: CT ABDOMEN AND PELVIS WITHOUT CONTRAST TECHNIQUE: Multidetector CT imaging of the abdomen and pelvis was performed following the standard protocol without IV  contrast. COMPARISON:  None. FINDINGS: Lung bases are free of acute infiltrate or sizable effusion. The liver, gallbladder, spleen, adrenal glands and pancreas are within normal limits. The kidneys are well visualized bilaterally. No renal calculi or urinary tract obstructive changes are noted. A tiny 1-2 mm calcification is noted at the right ureterovesical junction best seen on image number 65 of series 2. This is consistent with a partially obstructing distal ureteral stone. This is likely the etiology of the patient's discomfort. The the bladder is mildly distended. A right ovarian cyst is seen which measures approximately 2 cm in greatest dimension. No free pelvic fluid is seen. The osseous structures are within normal limits. IMPRESSION: Tiny distal right ureteral stone without significant obstructive change. Electronically Signed: By: Alcide CleverMark  Lukens M.D. On: 08/23/2015 17:31    [place] Transvaginal Non-ob 08/23/2015  CLINICAL DATA:  Right lower quadrant pain, known distal right ureteral stone and right ovary  cyst on recent CT EXAM: TRANSABDOMINAL AND TRANSVAGINAL ULTRASOUND OF PELVIS DOPPLER ULTRASOUND OF OVARIES TECHNIQUE: Both transabdominal and transvaginal ultrasound examinations of the pelvis were performed. Transabdominal technique was performed for global imaging of the pelvis including uterus, ovaries, adnexal regions, and pelvic cul-de-sac. It was necessary to proceed with endovaginal exam following the transabdominal exam to visualize the ovaries. Color and duplex Doppler ultrasound was utilized to evaluate blood flow to the ovaries. COMPARISON:  CT from earlier in the same day FINDINGS: Uterus Measurements: 8.8 x 4.5 x 6.1 cm. Nabothian cyst is noted. No definitive fibroids are seen. Endometrium Thickness: 8 mm.  No focal abnormality visualized. Right ovary Measurements: 4.8 x 2.1 x 3.5 cm. A 2.2 cm cyst is noted within the right ovary. Some smaller adjacent follicles are noted. Left ovary Measurements: 4.1 x 1.6 x 2.6 cm. Multiple follicular changes are noted. Pulsed Doppler evaluation of both ovaries demonstrates normal low-resistance arterial and venous waveforms. Other findings Minimal free fluid is noted within the pelvis on the right. IMPRESSION: No evidence of ovarian torsion Right ovarian cystic change similar to that seen on recent CT. Minimal free pelvic fluid. Electronically Signed   By: Alcide CleverMark  Lukens M.D.   On: 08/23/2015 21:42     2155:  Tx for BV and symptomatically for renal calculi/colic. Pt states she is ready to go home now. Dx and testing d/w pt and family.  Questions answered.  Verb understanding, agreeable to d/c home with outpt f/u.        Samuel JesterKathleen Jannely Henthorn, DO 08/26/15 [phone]

## 2015-08-23 NOTE — Discharge Instructions (Signed)
°Emergency Department Resource Guide °1) Find a Doctor and Pay Out of Pocket °Although you won't have to find out who is covered by your insurance plan, it is a good idea to ask around and get recommendations. You will then need to call the office and see if the doctor you have chosen will accept you as a new patient and what types of options they offer for patients who are self-pay. Some doctors offer discounts or will set up payment plans for their patients who do not have insurance, but you will need to ask so you aren't surprised when you get to your appointment. ° °2) Contact Your Local Health Department °Not all health departments have doctors that can see patients for sick visits, but many do, so it is worth a call to see if yours does. If you don't know where your local health department is, you can check in your phone book. The CDC also has a tool to help you locate your state's health department, and many state websites also have listings of all of their local health departments. ° °3) Find a Walk-in Clinic °If your illness is not likely to be very severe or complicated, you may want to try a walk in clinic. These are popping up all over the country in pharmacies, drugstores, and shopping centers. They're usually staffed by nurse practitioners or physician assistants that have been trained to treat common illnesses and complaints. They're usually fairly quick and inexpensive. However, if you have serious medical issues or chronic medical problems, these are probably not your best option. ° °No Primary Care Doctor: °- Call [facility] Connect at  832-8000 - they can help you locate a primary care doctor that  accepts your insurance, provides certain services, etc. °- Physician Referral Service- 1-800-533-3463 ° °Chronic Pain Problems: °Organization         Address  Phone   Notes  °[facility]  (336) 297-2271 Patients need to be referred by their primary care doctor.  ° °Medication  Assistance: °Organization         Address  Phone   Notes  °[place] [facility] [address] °[place], [place] [address] (336) 641-8030 --Must be a resident of [place] °-- Must have NO insurance coverage whatsoever (no Medicaid/ Medicare, etc.) °-- The pt. MUST have a primary care doctor that directs their care regularly and follows them in the community °  °[facility]  (866) 331-1348   °[employer]  (888) 892-1162   ° °Agencies that provide inexpensive medical care: °Organization         Address  Phone   Notes  °[facility]  (336) 832-8035   °[facility]    (336) 832-7272   °[facility] [address] °[place], [place] [address] (336) 832-4777   °[facility] [address], °[place] (336) 271-4999   °[facility]    (336) 373-0678   °[facility]    (336) 272-1050   °[facility] ° [address], [place] Phone:  (336) 832-4444, Fax:  (336) 832-4440 Hours of Operation:  9 am - 6 pm, M-F.  Also accepts Medicaid/Medicare and self-pay.  °[facility] ° [address], [place] Phone: (336) 832-3150, Fax: (336) [phone]. Hours of Operation:  8:30 am - 5:30 pm, M-F.  Also accepts Medicaid and self-pay.  °[facility] [place] [address], [place] Phone: (336) 878-6027   °[facility] [address], [place], [place] (336)723-1848, Ext. 123 Mondays & Thursdays: 7-9 AM.  First 15 patients are seen on a first come, first serve basis. °  ° °Medicaid-accepting [place] Providers: ° °Organization         Address  Phone   Notes  °[facility] [address], [place] (336) 641-2100 Also accepts self-pay patients.  °[facility] [address], [place] ° (336) 856-9996   °[facility] [address], [place]  (336) 288-8857   °[facility] [address] [place] [address], [place] (336) 299-7000   °Veita Bland [address], [place]  ° (336) 373-1557 Only accepts [place] Access Medicaid patients after they have their name applied to their card.  ° °Self-Pay (no insurance) in [place]: ° °Organization         Address  Phone   Notes  °Sickle Cell Patients, [facility] [address], [place] (336) 832-1970   °[facility] [address], [place] (336) 832-4400   °[facility] ° [address], [place] (336) 992-4800   °[facility]/Dr. Osei-Bonsu ° [address] [place] [address], [place] or [address], [place] (336) 841-8500 Phone number for both [place] and [place] locations is the same.  °[facility] [address], [place] (336) 299-0000   °[facility] [address] [place] [address], [place] or [address] (336) 852-7530 °(336) 878-2260   °[facility] [address], [place] (336) 350-1642, phone; (336) 294-5005, fax Sees patients 1st and 3rd Saturday of every month.  Must not qualify for public or private insurance (i.e. Medicaid, Medicare, [place] Health Choice, Veterans' Benefits) • Household income should be no more than 200% of the poverty level •The clinic cannot treat you if you are pregnant or think you are pregnant • Sexually transmitted diseases are not treated at the clinic.  ° ° °Dental Care: °Organization         Address  Phone  Notes  °[place] [facility] [address], [place] (336) 641-6152 Accepts children up to age 21 who are enrolled in Medicaid or [place] Health Choice; pregnant women with a Medicaid card; and children who have applied for Medicaid or [place] Health Choice, but were declined, whose parents can pay a reduced fee at time of service.  °[place]  [facility] [place]  [address], [place] (336) 641-7733 Accepts children up to age 21 who are enrolled in Medicaid or [place] Health Choice; pregnant women with a Medicaid card; and children who have applied for Medicaid or [place] Health Choice, but were declined, whose parents can pay a reduced fee at time of service.  °[facility] ° [address], [place] (336) 641-4533 Patients are seen by appointment only. Walk-ins are not accepted. [facility] will see patients 18 years of age and older. °Monday - Tuesday (8am-5pm) °Most Wednesdays (8:30-5pm) °$30 per visit, cash only  °[facility] ° [address], [place] (336) 641-4533 Patients are seen by appointment only. Walk-ins are not accepted. [facility] will see patients 18 years of age and older. °One   Wednesday Evening (Monthly: Volunteer Based).  $30 per visit, cash only  °[facility]  (919) 537-3737 for adults; Children under age 4, call Graduate Pediatric Dentistry at ([phone]) [phone]. Children aged 4-14, please call (919) [phone] to request a pediatric application. ° Dental services are provided in all areas of dental care including fillings, crowns and bridges, complete and partial dentures, implants, gum treatment, root canals, and extractions. Preventive care is also provided. Treatment is provided to both adults and children. °Patients are selected via a lottery and there is often a waiting list. °  °[facility] [address], °[place] ° (336) 763-8833 [email or web address] °  °[facility] [address], [place], [place] (336)723-1848, Ext. 123 Second and Fourth Thursday of each month, opens at 6:30 AM; Clinic ends at 9 AM.  Patients are seen on a first-come first-served basis, and a limited number are seen during each clinic.  ° °[facility] ° [address], [place], [place] (336) 723-7904    Eligibility Requirements °You must have lived in [place], [place], or [place] counties for at least the last three months. °  You cannot be eligible for state or federal sponsored healthcare insurance, including Veterans Administration, Medicaid, or Medicare. °  You generally cannot be eligible for healthcare insurance through your employer.  °  How to apply: °Eligibility screenings are held every Tuesday and Wednesday afternoon from 1:00 pm until 4:00 pm. You do not need an appointment for the interview!  °[facility] [address], [place], [place] 336-631-2330   °[facility]  336-342-8273   °[place]  336-703-3100   °[facility]  336-570-6415   ° °Behavioral Health Resources in the Community: °Intensive Outpatient Programs °Organization         Address  Phone  Notes  °[facility] [address], [place], [place] 336-878-6098   °[facility] [address], [place], [place] 336-832-9800   °[facility] [address], [place], [place] ° 336-882-2125   °[facility] [address],  °[place], [place] 1-800-853-5163 or 336-641-4981   °Substance Abuse Resources °Organization         Address  Phone  Notes  °[facility]  336-882-2125   °[facility]  336-784-9470   °[facility]  336-285-9073   °[facility]  336-845-3988   °Residential & Outpatient Substance Abuse Program  1-800-659-3381   °Psychological Services °Organization         Address  Phone  Notes  °[facility]  336- 832-9600   °[facility]  336- 378-7881   °[facility] [address], [place] 1-800-853-5163 or 336-641-4981   ° °Mobile Crisis Teams °Organization         Address  Phone  Notes  °[facility], Mobile Crisis Care Unit  1-877-626-1772   °Assertive °[facility] ° [address]  [place], [place] 336-834-9664   °Sharon DeEsch [address] °[place] 336-554-5454   ° °Self-Help/Support Groups °Organization         Address  Phone             Notes  °[facility] of  - variety of support groups  336- 373-1402 Call for more information  °Narcotics Anonymous (NA), [facility] [address], °[place]  2 meetings at this location  ° °  Residential Treatment Programs Organization         Address  Phone  Notes  [facility] [address],    [place]  [phone]   [facility]  [address], [place] [phone], [place], [place] [phone]   [facility] [address], [place] [phone] Admissions: 8am-3pm M-F  [facility] [address]    [place], [place] [phone]([phone]) [phone]   [facility] [address], [place], [place] [phone]   [facility] [address]  [place], [place] [phone]   [facility] Intensive Outpatient [address], [place], [place] [phone]   [facility] ([facility] [address]  [place], [place] [phone] or [phone]   [facility] ([facility]) [address] [place], [place] [phone] Accepts Medicaid  [facility] [address]  [place] [phone] Substance Abuse/Addiction Treatment   [facility] Organization         Address  Phone  Notes  [facility]  (737)314-2020(888) [phone]   Angie FavaJulie Brannon, PhD [address], [address] [place], [place]   ([phone]) [phone](336) [phone] or [phone](336) [phone]   [facility]   [address] [place], [place] [phone](336) [phone]   [facility] [address], [place], [place] [phone] Insurance/Medicaid/sponsorship through [facility]  [facility] [address]                                    [place], [place] [phone]) [phone] Therapy/tele-psych/case    [facility] [address]   [place], [place] [phone](336) [phone]    Dr. Lolly MustacheArfeen  [phone]) [phone]   [facility] [place]  [employer] [facility] 1) [address], [place] 2) [address], [place] 3)  [address], [place] [phone](336) [phone]) [phone]  [phone]   [facility] Child Abuse Hotline (431)882-6533(336) [phone] or [phone](336) [phone] (After Hours)      Take the prescriptions as directed.  Apply moist heat to the area(s) of discomfort, for 15 minutes at a time, several times per day for the next few days.  Do not fall asleep on a heating pack.  Call your regular OB/GYN doctor and your regular medical doctor tomorrow to schedule a follow up appointment within the next week.  Return to the Emergency Department immediately if worsening.

## 2015-08-23 NOTE — ED Notes (Signed)
Pt states understanding of care given and follow up instructions.  Pt ambulated from ED  

## 2015-08-23 NOTE — ED Notes (Signed)
Pt c/o RT sided abdominal pain, blood in urine, and nausea.

## 2015-08-27 LAB — GC/CHLAMYDIA PROBE AMP (~~LOC~~) NOT AT ARMC
Chlamydia: NEGATIVE
Neisseria Gonorrhea: NEGATIVE

## 2015-10-07 NOTE — L&D Delivery Note (Signed)
25 y.o. [id number] at [id number] delivered a viable female infant in cephalic, LOA position. No nuchal cord. Right anterior shoulder delivered with ease. 60 sec delayed cord clamping. Cord clamped x2 and cut. Placenta delivered spontaneously intact, with 3VC. Fundus firm on exam with massage and pitocin. Good hemostasis noted.  Laceration: None Suture: N/A Good hemostasis noted. EBL: 100 cc  Mom and baby recovering in LDR.    Apgars:  9/9 Weight: 3864g, 8#8.3oz    Jen MowElizabeth Vondell Sowell, DO [facility] 10/05/2016, 3:44 AM

## 2015-10-29 ENCOUNTER — Ambulatory Visit: Payer: Medicaid Other | Admitting: Women's Health

## 2015-10-30 ENCOUNTER — Encounter: Payer: Self-pay | Admitting: Women's Health

## 2015-10-30 ENCOUNTER — Ambulatory Visit (INDEPENDENT_AMBULATORY_CARE_PROVIDER_SITE_OTHER): Payer: Medicaid Other | Admitting: Women's Health

## 2015-10-30 VITALS — BP 126/64 | HR 72 | Wt 171.0 lb

## 2015-10-30 DIAGNOSIS — N898 Other specified noninflammatory disorders of vagina: Secondary | ICD-10-CM

## 2015-10-30 DIAGNOSIS — Z113 Encounter for screening for infections with a predominantly sexual mode of transmission: Secondary | ICD-10-CM | POA: Diagnosis not present

## 2015-10-30 DIAGNOSIS — B3731 Acute candidiasis of vulva and vagina: Secondary | ICD-10-CM

## 2015-10-30 DIAGNOSIS — B373 Candidiasis of vulva and vagina: Secondary | ICD-10-CM | POA: Diagnosis not present

## 2015-10-30 LAB — POCT WET PREP (WET MOUNT): Clue Cells Wet Prep Whiff POC: NEGATIVE

## 2015-10-30 MED ORDER — FLUCONAZOLE 150 MG PO TABS: 150.0000 mg | ORAL_TABLET | Freq: Once | ORAL | Status: DC

## 2015-10-30 NOTE — Progress Notes (Signed)
Patient ID: Monica Cox, female   DOB: [birth year], 25 y.o.   MRN: [id number]   [facility] Visit  Patient name: Monica Cox MRN [id number]  Date of birth: 1991/03/02  CC & HPI:  Monica Cox is a 25 y.o. [id number] African American female presenting today for std check. Been w/ partner since July, he recently found a sore on his penis- has never been tested for STDs, pt denies sores/etc on herself. Does report thick white d/x w/ some itching x 5d. Also wants to be checked for gc/ct.  Does not want to get pregnant right now, however she is not taking her coc's as directed.  Patient's last menstrual period was 10/09/2015 (approximate). The current method of family planning is OCP (estrogen/progesterone). Last pap 2015- neg  Pertinent History Reviewed:  Medical & Surgical Hx:   Past Medical History  Diagnosis Date  . Asthma   . Reflux   . Vaginal Pap smear, abnormal   . Anemia   . Trichomonas infection 08-08-2014   Past Surgical History  Procedure Laterality Date  . Esophagogastroduodenoscopy endoscopy    . Dilation and curettage of uterus      [facility] Dr. Despina Hidden performed   Medications: Reviewed & Updated - see associated section Social History: Reviewed -  reports that she has been smoking Cigars.  She has never used smokeless tobacco.  Objective Findings:  Vitals: BP 126/64 mmHg  Pulse 72  Wt 171 lb (77.565 kg)  LMP 10/09/2015 (Approximate)  Breastfeeding? No Body mass index is 30.3 kg/(m^2).  Physical Examination: General appearance - alert, well appearing, and in no distress Pelvic - normal external genitalia, cx clear, mod amt thick white clumpy nonodorous d/c  Results for orders placed or performed in visit on 10/30/15 (from the past 24 hour(s))  POCT Wet Prep Mellody Drown [place])   Collection Time: 10/30/15  4:19 PM  Result Value Ref Range   Source Wet Prep POC vaginal    WBC, Wet Prep HPF POC few    Bacteria Wet Prep HPF POC None None, Few, Too numerous to  count   BACTERIA WET PREP MORPHOLOGY POC     Clue Cells Wet Prep HPF POC None None, Too numerous to count   Clue Cells Wet Prep Whiff POC Negative Whiff    Yeast Wet Prep HPF POC Few    KOH Wet Prep POC     Trichomonas Wet Prep HPF POC none      Assessment & Plan:  A:   Vulvovaginal candida  STD screen  P:  Rx diflucan for yeast  Start taking coc's daily as directed  Have partner be tested for STDs/HSV  Return for after 3/15 for physical.  Marge Duncans CNM, [facility] 10/30/2015 4:19 PM

## 2015-10-30 NOTE — Patient Instructions (Signed)

## 2015-11-01 LAB — GC/CHLAMYDIA PROBE AMP
CHLAMYDIA, DNA PROBE: NEGATIVE
NEISSERIA GONORRHOEAE BY PCR: NEGATIVE

## 2015-11-05 ENCOUNTER — Telehealth: Payer: Self-pay | Admitting: Women's Health

## 2015-11-05 NOTE — Telephone Encounter (Signed)
Pt informed of Negative GC/CHL from 10/30/2015.

## 2015-11-13 ENCOUNTER — Emergency Department (HOSPITAL_COMMUNITY)
Admission: EM | Admit: 2015-11-13 | Discharge: 2015-11-14 | Disposition: A | Discharge status: Home / Self Care | Payer: Medicaid Other | Attending: Emergency Medicine | Admitting: Emergency Medicine

## 2015-11-13 ENCOUNTER — Encounter (HOSPITAL_COMMUNITY): Payer: Self-pay | Admitting: Emergency Medicine

## 2015-11-13 DIAGNOSIS — L259 Unspecified contact dermatitis, unspecified cause: Secondary | ICD-10-CM | POA: Diagnosis not present

## 2015-11-13 DIAGNOSIS — Z8619 Personal history of other infectious and parasitic diseases: Secondary | ICD-10-CM | POA: Insufficient documentation

## 2015-11-13 DIAGNOSIS — J45909 Unspecified asthma, uncomplicated: Secondary | ICD-10-CM | POA: Insufficient documentation

## 2015-11-13 DIAGNOSIS — R21 Rash and other nonspecific skin eruption: Secondary | ICD-10-CM | POA: Diagnosis present

## 2015-11-13 DIAGNOSIS — Z87891 Personal history of nicotine dependence: Secondary | ICD-10-CM | POA: Insufficient documentation

## 2015-11-13 DIAGNOSIS — Z862 Personal history of diseases of the blood and blood-forming organs and certain disorders involving the immune mechanism: Secondary | ICD-10-CM | POA: Insufficient documentation

## 2015-11-13 DIAGNOSIS — Z8719 Personal history of other diseases of the digestive system: Secondary | ICD-10-CM | POA: Diagnosis not present

## 2015-11-13 NOTE — ED Notes (Signed)
Woke up this morning with red itchy bumps on arms, back of neck, and under breasts.  Also c/o headache and cold chills

## 2015-11-14 MED ORDER — DIPHENHYDRAMINE HCL 25 MG PO CAPS
50.0000 mg | ORAL_CAPSULE | Freq: Once | ORAL | Status: AC
  Administered 2015-11-14: 50 mg via ORAL
  Filled 2015-11-14: qty 2

## 2015-11-14 MED ORDER — PREDNISONE 50 MG PO TABS
60.0000 mg | ORAL_TABLET | Freq: Once | ORAL | Status: AC
  Administered 2015-11-14: 60 mg via ORAL
  Filled 2015-11-14: qty 1

## 2015-11-14 MED ORDER — PREDNISONE 10 MG (21) PO TBPK: 10.0000 mg | ORAL_TABLET | Freq: Every day | ORAL | Status: DC

## 2015-11-14 NOTE — Discharge Instructions (Signed)
Contact Dermatitis Dermatitis is redness, soreness, and swelling (inflammation) of the skin. Contact dermatitis is a reaction to certain substances that touch the skin. There are two types of contact dermatitis:   Irritant contact dermatitis. This type is caused by something that irritates your skin, such as dry hands from washing them too much. This type does not require previous exposure to the substance for a reaction to occur. This type is more common.  Allergic contact dermatitis. This type is caused by a substance that you are allergic to, such as a nickel allergy or poison ivy. This type only occurs if you have been exposed to the substance (allergen) before. Upon a repeat exposure, your body reacts to the substance. This type is less common. CAUSES  Many different substances can cause contact dermatitis. Irritant contact dermatitis is most commonly caused by exposure to:   Makeup.   Soaps.   Detergents.   Bleaches.   Acids.   Metal salts, such as nickel.  Allergic contact dermatitis is most commonly caused by exposure to:   Poisonous plants.   Chemicals.   Jewelry.   Latex.   Medicines.   Preservatives in products, such as clothing.  RISK FACTORS This condition is more likely to develop in:   People who have jobs that expose them to irritants or allergens.  People who have certain medical conditions, such as asthma or eczema.  SYMPTOMS  Symptoms of this condition may occur anywhere on your body where the irritant has touched you or is touched by you. Symptoms include:  Dryness or flaking.   Redness.   Cracks.   Itching.   Pain or a burning feeling.   Blisters.  Drainage of small amounts of blood or clear fluid from skin cracks. With allergic contact dermatitis, there may also be swelling in areas such as the eyelids, mouth, or genitals.  DIAGNOSIS  This condition is diagnosed with a medical history and physical exam. A patch skin test  may be performed to help determine the cause. If the condition is related to your job, you may need to see an occupational medicine specialist. TREATMENT Treatment for this condition includes figuring out what caused the reaction and protecting your skin from further contact. Treatment may also include:   Steroid creams or ointments. Oral steroid medicines may be needed in more severe cases.  Antibiotics or antibacterial ointments, if a skin infection is present.  Antihistamine lotion or an antihistamine taken by mouth to ease itching.  A bandage (dressing). HOME CARE INSTRUCTIONS Skin Care  Moisturize your skin as needed.   Apply cool compresses to the affected areas.  Try taking a bath with:  Epsom salts. Follow the instructions on the packaging. You can get these at your local pharmacy or grocery store.  Baking soda. Pour a small amount into the bath as directed by your health care provider.  Colloidal oatmeal. Follow the instructions on the packaging. You can get this at your local pharmacy or grocery store.  Try applying baking soda paste to your skin. Stir water into baking soda until it reaches a paste-like consistency.  Do not scratch your skin.  Bathe less frequently, such as every other day.  Bathe in lukewarm water. Avoid using hot water. Medicines  Take or apply over-the-counter and prescription medicines only as told by your health care provider.   If you were prescribed an antibiotic medicine, take or apply your antibiotic as told by your health care provider. Do not stop using the   antibiotic even if your condition starts to improve. General Instructions  Keep all follow-up visits as told by your health care provider. This is important.  Avoid the substance that caused your reaction. If you do not know what caused it, keep a journal to try to track what caused it. Write down:  What you eat.  What cosmetic products you use.  What you drink.  What  you wear in the affected area. This includes jewelry.  If you were given a dressing, take care of it as told by your health care provider. This includes when to change and remove it. SEEK MEDICAL CARE IF:   Your condition does not improve with treatment.  Your condition gets worse.  You have signs of infection such as swelling, tenderness, redness, soreness, or warmth in the affected area.  You have a fever.  You have new symptoms. SEEK IMMEDIATE MEDICAL CARE IF:   You have a severe headache, neck pain, or neck stiffness.  You vomit.  You feel very sleepy.  You notice red streaks coming from the affected area.  Your bone or joint underneath the affected area becomes painful after the skin has healed.  The affected area turns darker.  You have difficulty breathing.   This information is not intended to replace advice given to you by your health care provider. Make sure you discuss any questions you have with your health care provider.   Document Released: 09/19/2000 Document Revised: 06/13/2015 Document Reviewed: 02/07/2015 Elsevier Interactive Patient Education 2016 [employer]  

## 2015-11-14 NOTE — ED Provider Notes (Signed)
By signing my name below, I, Budd Palmer, attest that this documentation has been prepared under the direction and in the presence of [employer], DO. Electronically Signed: Budd Palmer, ED Scribe. 11/14/2015. 12:23 AM.  TIME SEEN: 12:20 AM   CHIEF COMPLAINT: Rash  HPI: JENNY LAI is a 25 y.o. female who presents to the Emergency Department complaining of an itchy, raised, diffuse rash onset this morning. She has tried taking benadryl with mild relief. She denies any recent exposures to new products or environments, as well as foods or medicines. Pt denies fever. No other associated symptoms. States that her child sleeps in the bed with her and her child does not have similar symptoms. She has not changed mattresses recently.  ROS: See HPI Constitutional: no fever  Eyes: no drainage  ENT: no runny nose   Cardiovascular:  no chest pain  Resp: no SOB  GI: no vomiting GU: no dysuria Integumentary: no rash  Allergy: no hives  Musculoskeletal: no leg swelling  Neurological: no slurred speech ROS otherwise negative  PAST MEDICAL HISTORY/PAST SURGICAL HISTORY:  Past Medical History  Diagnosis Date  . Asthma   . Reflux   . Vaginal Pap smear, abnormal   . Anemia   . Trichomonas infection 08-08-2014    MEDICATIONS:  Prior to Admission medications   Medication Sig Start Date End Date Taking? Authorizing Provider  albuterol (PROVENTIL HFA;VENTOLIN HFA) 108 (90 BASE) MCG/ACT inhaler Inhale 2 puffs into the lungs every 6 (six) hours as needed for wheezing or shortness of breath. Patient not taking: Reported on 10/30/2015 06/20/15   Cheral Marker, CNM  fluconazole (DIFLUCAN) 150 MG tablet Take 1 tablet (150 mg total) by mouth once. Take 1 pill now, may take 2nd pill in 3 days if needed 10/30/15   Cheral Marker, CNM  HYDROcodone-acetaminophen (NORCO/VICODIN) 5-325 MG tablet 1 or 2 tabs PO q6 hours prn pain Patient not taking: Reported on 10/30/2015 08/23/15   Samuel Jester, DO  ibuprofen (ADVIL,MOTRIN) 600 MG tablet Take 1 tablet (600 mg total) by mouth every 6 (six) hours as needed. Patient not taking: Reported on 08/23/2015 07/30/15   Zadie Rhine, MD  metroNIDAZOLE (FLAGYL) 500 MG tablet Take 1 tablet (500 mg total) by mouth 2 (two) times daily. Patient not taking: Reported on 10/30/2015 08/23/15   Samuel Jester, DO  naproxen (NAPROSYN) 250 MG tablet Take 1 tablet (250 mg total) by mouth 2 (two) times daily with a meal. Patient not taking: Reported on 10/30/2015 08/23/15   Samuel Jester, DO  ondansetron (ZOFRAN) 4 MG tablet Take 1 tablet (4 mg total) by mouth every 8 (eight) hours as needed for nausea or vomiting. Patient not taking: Reported on 10/30/2015 08/23/15   Samuel Jester, DO    ALLERGIES:  No Known Allergies  SOCIAL HISTORY:  Social History  Substance Use Topics  . Smoking status: Former Smoker    Types: Cigars  . Smokeless tobacco: Never Used  . Alcohol Use: 0.0 oz/week    0 Standard drinks or equivalent per week     Comment: occasionally    FAMILY HISTORY: Family History  Problem Relation Age of Onset  . Ulcers Father   . Hypertension Father   . Ulcers Paternal Grandmother   . Diabetes Paternal Grandmother   . Ulcers Paternal Aunt   . Liver disease Neg Hx   . Colon cancer Neg Hx   . Hypertension Mother     EXAM: BP 121/80 mmHg  Pulse 74  Temp(Src) 98.5 F (36.9 C) (Oral)  Resp 16  Ht  (1.549 m)  Wt 173 lb 1.6 oz (78.518 kg)  BMI 32.72 kg/m2  SpO2 100%  LMP 11/08/2015 (Approximate) CONSTITUTIONAL: Alert and oriented and responds appropriately to questions. Well-appearing; well-nourished; afebrile, non-toxic HEAD: Normocephalic EYES: Conjunctivae clear, PERRL ENT: normal nose; no rhinorrhea; moist mucous membranes; pharynx without lesions noted; no angioedema NECK: Supple, no meningismus, no LAD  CARD: RRR; S1 and S2 appreciated; no murmurs, no clicks, no rubs, no gallops RESP: Normal chest  excursion without splinting or tachypnea; breath sounds clear and equal bilaterally; no wheezes, no rhonchi, no rales, no hypoxia or respiratory distress, speaking full sentences ABD/GI: Normal bowel sounds; non-distended; soft, non-tender, no rebound, no guarding, no peritoneal signs BACK:  The back appears normal and is non-tender to palpation, there is no CVA tenderness EXT: Normal ROM in all joints; non-tender to palpation; no edema; normal capillary refill; no cyanosis, no calf tenderness or swelling    SKIN: Normal color for age and race; warm; diffuse, scattered maculopapular erythematous rash with no blisters or desquamation, no petechiae or purpura, no rash involving the mucous membranes, palms, or soles NEURO: Moves all extremities equally, sensation to light touch intact diffusely, cranial nerves II through XII intact PSYCH: The patient's mood and manner are appropriate. Grooming and personal hygiene are appropriate.  MEDICAL DECISION MAKING: Patient was scattered maculopapular erythematous pruritic rash that looks like contact dermatitis. No sign of life-threatening rash on exam. No fever. She is nontoxic appearing. Have advised her to continue using Benadryl will discharge on steroid taper. Discussed return precautions. She does have a PCP for follow-up. This does not look like scabies or bedbugs. This does not look like and infectious rash. She verbalizes understanding and is comfortable with this plan.     I personally performed the services described in this documentation, which was scribed in my presence. The recorded information has been reviewed and is accurate.    Layla Maw Lawernce Earll, DO 11/14/15 [phone]

## 2015-12-22 ENCOUNTER — Encounter (HOSPITAL_COMMUNITY): Payer: Self-pay | Admitting: *Deleted

## 2015-12-22 ENCOUNTER — Emergency Department (HOSPITAL_COMMUNITY)
Admission: EM | Admit: 2015-12-22 | Discharge: 2015-12-23 | Disposition: A | Discharge status: Home / Self Care | Payer: Medicaid Other | Attending: Emergency Medicine | Admitting: Emergency Medicine

## 2015-12-22 ENCOUNTER — Emergency Department (HOSPITAL_COMMUNITY): Payer: Medicaid Other

## 2015-12-22 DIAGNOSIS — Z792 Long term (current) use of antibiotics: Secondary | ICD-10-CM | POA: Insufficient documentation

## 2015-12-22 DIAGNOSIS — K1379 Other lesions of oral mucosa: Secondary | ICD-10-CM | POA: Insufficient documentation

## 2015-12-22 DIAGNOSIS — K011 Impacted teeth: Secondary | ICD-10-CM | POA: Insufficient documentation

## 2015-12-22 DIAGNOSIS — J45909 Unspecified asthma, uncomplicated: Secondary | ICD-10-CM | POA: Insufficient documentation

## 2015-12-22 DIAGNOSIS — Z79899 Other long term (current) drug therapy: Secondary | ICD-10-CM | POA: Insufficient documentation

## 2015-12-22 DIAGNOSIS — Z98818 Other dental procedure status: Secondary | ICD-10-CM

## 2015-12-22 DIAGNOSIS — K0889 Other specified disorders of teeth and supporting structures: Secondary | ICD-10-CM | POA: Diagnosis present

## 2015-12-22 DIAGNOSIS — Z87891 Personal history of nicotine dependence: Secondary | ICD-10-CM | POA: Diagnosis not present

## 2015-12-22 LAB — CBC WITH DIFFERENTIAL/PLATELET
Basophils Absolute: 0 10*3/uL (ref 0.0–0.1)
Basophils Relative: 1 %
EOS ABS: 0.1 10*3/uL (ref 0.0–0.7)
Eosinophils Relative: 1 %
HEMATOCRIT: 40.5 % (ref 36.0–46.0)
HEMOGLOBIN: 13.8 g/dL (ref 12.0–15.0)
LYMPHS ABS: 1.1 10*3/uL (ref 0.7–4.0)
LYMPHS PCT: 25 %
MCH: 30.5 pg (ref 26.0–34.0)
MCHC: 34.1 g/dL (ref 30.0–36.0)
MCV: 89.6 fL (ref 78.0–100.0)
Monocytes Absolute: 0.3 10*3/uL (ref 0.1–1.0)
Monocytes Relative: 6 %
NEUTROS ABS: 3.1 10*3/uL (ref 1.7–7.7)
NEUTROS PCT: 67 %
Platelets: 343 10*3/uL (ref 150–400)
RBC: 4.52 MIL/uL (ref 3.87–5.11)
RDW: 13 % (ref 11.5–15.5)
WBC: 4.6 10*3/uL (ref 4.0–10.5)

## 2015-12-22 MED ORDER — MORPHINE SULFATE (PF) 4 MG/ML IV SOLN
4.0000 mg | Freq: Once | INTRAVENOUS | Status: AC
  Administered 2015-12-22: 4 mg via INTRAVENOUS
  Filled 2015-12-22: qty 1

## 2015-12-22 MED ORDER — ONDANSETRON HCL 4 MG/2ML IJ SOLN
4.0000 mg | Freq: Once | INTRAMUSCULAR | Status: AC
  Administered 2015-12-22: 4 mg via INTRAVENOUS
  Filled 2015-12-22: qty 2

## 2015-12-22 MED ORDER — CLINDAMYCIN PHOSPHATE 900 MG/50ML IV SOLN
900.0000 mg | Freq: Once | INTRAVENOUS | Status: AC
  Administered 2015-12-22: 900 mg via INTRAVENOUS
  Filled 2015-12-22: qty 50

## 2015-12-22 MED ORDER — IOHEXOL 300 MG/ML  SOLN
75.0000 mL | Freq: Once | INTRAMUSCULAR | Status: AC | PRN
  Administered 2015-12-22: 75 mL via INTRAVENOUS

## 2015-12-22 NOTE — ED Notes (Signed)
Pt had 3 wisdom teeth removed and another infected tooth removed as well. Pt c/o continues pain and swelling to the lower right side of her jaw.

## 2015-12-23 LAB — BASIC METABOLIC PANEL
Anion gap: 8 (ref 5–15)
BUN: 11 mg/dL (ref 6–20)
CHLORIDE: 104 mmol/L (ref 101–111)
CO2: 26 mmol/L (ref 22–32)
Calcium: 9.5 mg/dL (ref 8.9–10.3)
Creatinine, Ser: 0.67 mg/dL (ref 0.44–1.00)
GFR calc Af Amer: >60 mL/min (ref >60)
GFR calc non Af Amer: >60 mL/min (ref >60)
Glucose, Bld: 88 mg/dL (ref 65–99)
POTASSIUM: 3.6 mmol/L (ref 3.5–5.1)
SODIUM: 138 mmol/L (ref 135–145)

## 2015-12-23 MED ORDER — OXYCODONE-ACETAMINOPHEN 5-325 MG PO TABS
2.0000 | ORAL_TABLET | Freq: Once | ORAL | Status: AC
  Administered 2015-12-23: 2 via ORAL
  Filled 2015-12-23: qty 2

## 2015-12-23 MED ORDER — OXYCODONE-ACETAMINOPHEN 5-325 MG PO TABS: 1.0000 | ORAL_TABLET | ORAL | Status: DC | PRN

## 2015-12-23 MED ORDER — CLINDAMYCIN HCL 150 MG PO CAPS: 150.0000 mg | ORAL_CAPSULE | Freq: Four times a day (QID) | ORAL | Status: DC

## 2015-12-23 NOTE — ED Provider Notes (Signed)
CSN: [id number]     Arrival date & time 12/22/15  2141 History   First MD Initiated Contact with Patient 12/22/15 2229     Chief Complaint  Patient presents with  . Dental Pain     (Consider location/radiation/quality/duration/timing/severity/associated sxs/prior Treatment) The history is provided by the patient.   Monica Cox is a 25 y.o. female who presents for evaluation of severe dental and mouth pain since having 3 wisdom teeth and a right lower molar tooth (which was infected) extracted 4 days ago by Dr. Gaspar Cola in [facility], referred by her dentist in [place] [facility].  Monica Cox is currently taking amoxil and was taking hydrocodone tablets which was adequately controlling her pain (took last dose yesterday). Over the past 2 days Monica Cox has developed worsened pain, swelling and induration underneath her right jawline.  Monica Cox is unable to open her mouth enough to eat or drink secondary to pain.  Monica Cox denies fevers or chills, nausea or vomiting.  Monica Cox endorses a bad taste and drainage coming from the right lower dental area.      Past Medical History  Diagnosis Date  . Asthma   . Reflux   . Vaginal Pap smear, abnormal   . Anemia   . Trichomonas infection 08-08-2014   Past Surgical History  Procedure Laterality Date  . Esophagogastroduodenoscopy endoscopy    . Dilation and curettage of uterus      [facility] Dr. Despina Hidden performed   Family History  Problem Relation Age of Onset  . Ulcers Father   . Hypertension Father   . Ulcers Paternal Grandmother   . Diabetes Paternal Grandmother   . Ulcers Paternal Aunt   . Liver disease Neg Hx   . Colon cancer Neg Hx   . Hypertension Mother    Social History  Substance Use Topics  . Smoking status: Former Smoker    Types: Cigars  . Smokeless tobacco: Never Used  . Alcohol Use: 0.0 oz/week    0 Standard drinks or equivalent per week     Comment: occasionally   OB History    Gravida Para Term Preterm AB TAB SAB  Ectopic Multiple Living   0 1 0 1 0 0 2     Review of Systems  Constitutional: Negative for fever.  HENT: Positive for dental problem. Negative for facial swelling and sore throat.   Respiratory: Negative for shortness of breath.   Musculoskeletal: Negative for neck pain and neck stiffness.      Allergies  Review of patient's allergies indicates no known allergies.  Home Medications   Prior to Admission medications   Medication Sig Start Date End Date Taking? Authorizing Provider  albuterol (PROVENTIL HFA;VENTOLIN HFA) 108 (90 BASE) MCG/ACT inhaler Inhale 2 puffs into the lungs every 6 (six) hours as needed for wheezing or shortness of breath. 06/20/15  Yes Cheral Marker, CNM  amoxicillin (AMOXIL) 500 MG capsule Take 500 mg by mouth 2 (two) times daily.   Yes Historical Provider, MD  clindamycin (CLEOCIN) 150 MG capsule Take 1 capsule (150 mg total) by mouth every 6 (six) hours. 12/23/15   Burgess Amor, PA-C  HYDROcodone-acetaminophen (NORCO/VICODIN) 5-325 MG tablet 1 or 2 tabs PO q6 hours prn pain Patient not taking: Reported on 10/30/2015 08/23/15   Samuel Jester, DO  ibuprofen (ADVIL,MOTRIN) 600 MG tablet Take 1 tablet (600 mg total) by mouth every 6 (six) hours as needed. Patient not taking: Reported on 08/23/2015 07/30/15   Dorinda Hill  Bebe ShaggyWickline, MD  metroNIDAZOLE (FLAGYL) 500 MG tablet Take 1 tablet (500 mg total) by mouth 2 (two) times daily. Patient not taking: Reported on 10/30/2015 08/23/15   Samuel JesterKathleen McManus, DO  naproxen (NAPROSYN) 250 MG tablet Take 1 tablet (250 mg total) by mouth 2 (two) times daily with a meal. Patient not taking: Reported on 10/30/2015 08/23/15   Samuel JesterKathleen McManus, DO  ondansetron (ZOFRAN) 4 MG tablet Take 1 tablet (4 mg total) by mouth every 8 (eight) hours as needed for nausea or vomiting. Patient not taking: Reported on 12/22/2015 08/23/15   Samuel JesterKathleen McManus, DO  oxyCODONE-acetaminophen (PERCOCET/ROXICET) 5-325 MG tablet Take 1 tablet by mouth every  4 (four) hours as needed. 12/23/15   Burgess AmorJulie Marianny Goris, PA-C  predniSONE (STERAPRED UNI-PAK 21 TAB) 10 MG (21) TBPK tablet Take 1 tablet (10 mg total) by mouth daily. Take 6 tabs by mouth daily  for 2 days, then 5 tabs for 2 days, then 4 tabs for 2 days, then 3 tabs for 2 days, 2 tabs for 2 days, then 1 tab by mouth daily for 2 days Patient not taking: Reported on 12/22/2015 11/14/15   Kristen N Ward, DO   BP 123/85 mmHg  Pulse 92  Temp(Src) 98.8 F (37.1 C) (Oral)  Resp 18  Ht 5' 3.5" (1.613 m)  Wt 81.874 kg  BMI 31.47 kg/m2  SpO2 100%  LMP 12/06/2015 Physical Exam  Constitutional: Monica Cox is oriented to person, place, and time. Monica Cox appears well-developed and well-nourished. No distress.  HENT:  Head: Normocephalic and atraumatic.  Right Ear: Tympanic membrane and external ear normal.  Left Ear: Tympanic membrane and external ear normal.  Mouth/Throat: Oropharynx is clear and moist and mucous membranes are normal. No oral lesions. There is trismus in the jaw.  ttp along right jawline with moderate edema.  Right submandibular adenopathy with soft edema of sublingual space.  I can somewhat visualize the extraction sites, but difficult due to trismus.    Eyes: Conjunctivae are normal.  Neck: Normal range of motion. Neck supple.  Cardiovascular: Normal rate and normal heart sounds.   Pulmonary/Chest: Effort normal.  Abdominal: Monica Cox exhibits no distension.  Musculoskeletal: Normal range of motion.  Lymphadenopathy:    Monica Cox has no cervical adenopathy.  Neurological: Monica Cox is alert and oriented to person, place, and time.  Skin: Skin is warm and dry. No erythema.  Psychiatric: Monica Cox has a normal mood and affect.    ED Course  Procedures (including critical care time) Labs Review  Results for orders placed or performed during the hospital encounter of 12/22/15  CBC with Differential  Result Value Ref Range   WBC 4.6 4.0 - 10.5 K/uL   RBC 4.52 3.87 - 5.11 MIL/uL   Hemoglobin 13.8 12.0 - 15.0 g/dL    HCT 16.140.5 09.636.0 - 04.546.0 %   MCV 89.6 78.0 - 100.0 fL   MCH 30.5 26.0 - 34.0 pg   MCHC 34.1 30.0 - 36.0 g/dL   RDW 40.913.0 81.111.5 - 91.415.5 %   Platelets 343 150 - 400 K/uL   Neutrophils Relative % 67 %   Neutro Abs 3.1 1.7 - 7.7 K/uL   Lymphocytes Relative 25 %   Lymphs Abs 1.1 0.7 - 4.0 K/uL   Monocytes Relative 6 %   Monocytes Absolute 0.3 0.1 - 1.0 K/uL   Eosinophils Relative 1 %   Eosinophils Absolute 0.1 0.0 - 0.7 K/uL   Basophils Relative 1 %   Basophils Absolute 0.0 0.0 - 0.1 K/uL  Basic  metabolic panel  Result Value Ref Range   Sodium 138 135 - 145 mmol/L   Potassium 3.6 3.5 - 5.1 mmol/L   Chloride 104 101 - 111 mmol/L   CO2 26 22 - 32 mmol/L   Glucose, Bld 88 65 - 99 mg/dL   BUN 11 6 - 20 mg/dL   Creatinine, Ser 1.61 0.44 - 1.00 mg/dL   Calcium 9.5 8.9 - 09.6 mg/dL   GFR calc non Af Amer >60 >60 mL/min   GFR calc Af Amer >60 >60 mL/min   Anion gap 8 5 - 15   Ct Soft Tissue Neck W Contrast  12/23/2015  CLINICAL DATA:  Initial evaluation for edema and trismus following dental extractions. Pain at right side of jaw. EXAM: CT NECK WITH CONTRAST TECHNIQUE: Multidetector CT imaging of the neck was performed using the standard protocol following the bolus administration of intravenous contrast. CONTRAST:  75mL OMNIPAQUE IOHEXOL 300 MG/ML  SOLN COMPARISON:  None. FINDINGS: Visualized brain demonstrates no acute process. Globes and orbits within normal limits. Paranasal sinuses are clear. No mastoid effusion. Middle ear cavities clear. Salivary glands including the parotid glands and submandibular glands within normal limits. Oral cavity within normal limits. Probable tongue dual re- noted. Sequela recent tooth extraction noted. Mild asymmetric edema about the right mandibular body, likely related to recent tooth extraction. No other significant inflammatory changes. No abscess or drainable fluid collection. Mild asymmetric thickening within the right platysmas. Mildly prominent right level 2  lymph node measures 11 mm, likely reactive. Similarly, mildly prominent right level 1 B levels at the upper limits of normal at 1 cm, also likely reactive. No other significant adenopathy within the neck. Remainder of the oropharynx within normal limits. Nasopharynx normal. Vallecula clear. Epiglottis normal. Hypopharynx and supraglottic larynx normal. True cords normal. Subglottic airway clear. Thyroid gland normal. Visualized superior mediastinum within normal limits. Visualized lungs are clear. Normal intravascular enhancement seen throughout the neck. No acute osseous abnormality. No worrisome lytic or blastic osseous lesions. IMPRESSION: 1. Subtle asymmetric soft tissue edema and stranding about the right mandibular body, likely related to recent tooth extraction. While these findings are favored to reflect normal expected postoperative changes, early/developing infection/ cellulitis could be considered in the correct clinical setting. No abscess or drainable fluid collection. 2. Mildly prominent right level 1 and 2 adenopathy, likely reactive. Electronically Signed   By: Rise Mu M.D.   On: 12/23/2015 01:01     Imaging Review  I have personally reviewed and evaluated these images and lab results as part of my medical decision-making.    MDM   Final diagnoses:  Status post wisdom tooth extraction  Mouth pain    Pt was given IV fluids, clindamycin 900 mg IV, morphine and zofran with improvement in pain.  Labs normal.  CT scan reviewed and discussed with pt.  Monica Cox was switched to clindamycin, advised to stop amoxil.  Prescribed oxycodone for pain relief.  Monica Cox felt improved at time of dc.  Advised Monica Cox needs recheck by her oral surgeon on Monday, returning here tomorrow if there is any worsened pain, swelling or other complaints.  Pt and mother at bedside understand and agree with plan.    Burgess Amor, PA-C 12/24/15 1206  Bethann Berkshire, MD 12/26/15 2010

## 2015-12-24 ENCOUNTER — Other Ambulatory Visit: Payer: Medicaid Other | Admitting: Women's Health

## 2015-12-25 ENCOUNTER — Other Ambulatory Visit: Payer: Self-pay | Admitting: Women's Health

## 2016-01-07 ENCOUNTER — Other Ambulatory Visit: Payer: Medicaid Other | Admitting: Women's Health

## 2016-01-07 ENCOUNTER — Encounter: Payer: Self-pay | Admitting: Women's Health

## 2016-02-06 ENCOUNTER — Encounter: Payer: Self-pay | Admitting: Adult Health

## 2016-02-06 ENCOUNTER — Ambulatory Visit (INDEPENDENT_AMBULATORY_CARE_PROVIDER_SITE_OTHER): Payer: Medicaid Other | Admitting: Adult Health

## 2016-02-06 VITALS — BP 128/80 | HR 88 | Ht 63.5 in | Wt 185.5 lb

## 2016-02-06 DIAGNOSIS — Z3201 Encounter for pregnancy test, result positive: Secondary | ICD-10-CM

## 2016-02-06 DIAGNOSIS — Z3490 Encounter for supervision of normal pregnancy, unspecified, unspecified trimester: Secondary | ICD-10-CM

## 2016-02-06 DIAGNOSIS — N921 Excessive and frequent menstruation with irregular cycle: Secondary | ICD-10-CM

## 2016-02-06 DIAGNOSIS — R110 Nausea: Secondary | ICD-10-CM | POA: Diagnosis not present

## 2016-02-06 DIAGNOSIS — O3680X0 Pregnancy with inconclusive fetal viability, not applicable or unspecified: Secondary | ICD-10-CM

## 2016-02-06 DIAGNOSIS — F17200 Nicotine dependence, unspecified, uncomplicated: Secondary | ICD-10-CM

## 2016-02-06 HISTORY — DX: Nicotine dependence, unspecified, uncomplicated: F17.200

## 2016-02-06 HISTORY — DX: Nausea: R11.0

## 2016-02-06 LAB — POCT URINE PREGNANCY: PREG TEST UR: POSITIVE — AB

## 2016-02-06 MED ORDER — DOXYLAMINE-PYRIDOXINE 10-10 MG PO TBEC: DELAYED_RELEASE_TABLET | ORAL | Status: DC

## 2016-02-06 MED ORDER — OB COMPLETE PETITE 35-5-1-200 MG PO CAPS: ORAL_CAPSULE | ORAL | Status: DC

## 2016-02-06 NOTE — Patient Instructions (Signed)
First Trimester of Pregnancy The first trimester of pregnancy is from week 1 until the end of week 12 (months 1 through 3). A week after a sperm fertilizes an egg, the egg will implant on the wall of the uterus. This embryo will begin to develop into a baby. Genes from you and your partner are forming the baby. The female genes determine whether the baby is a boy or a girl. At 6-8 weeks, the eyes and face are formed, and the heartbeat can be seen on ultrasound. At the end of 12 weeks, all the baby's organs are formed.  Now that you are pregnant, you will want to do everything you can to have a healthy baby. Two of the most important things are to get good prenatal care and to follow your health care provider's instructions. Prenatal care is all the medical care you receive before the baby's birth. This care will help prevent, find, and treat any problems during the pregnancy and childbirth. BODY CHANGES Your body goes through many changes during pregnancy. The changes vary from woman to woman.   You may gain or lose a couple of pounds at first.  You may feel sick to your stomach (nauseous) and throw up (vomit). If the vomiting is uncontrollable, call your health care provider.  You may tire easily.  You may develop headaches that can be relieved by medicines approved by your health care provider.  You may urinate more often. Painful urination may mean you have a bladder infection.  You may develop heartburn as a result of your pregnancy.  You may develop constipation because certain hormones are causing the muscles that push waste through your intestines to slow down.  You may develop hemorrhoids or swollen, bulging veins (varicose veins).  Your breasts may begin to grow larger and become tender. Your nipples may stick out more, and the tissue that surrounds them (areola) may become darker.  Your gums may bleed and may be sensitive to brushing and flossing.  Dark spots or blotches (chloasma,  mask of pregnancy) may develop on your face. This will likely fade after the baby is born.  Your menstrual periods will stop.  You may have a loss of appetite.  You may develop cravings for certain kinds of food.  You may have changes in your emotions from day to day, such as being excited to be pregnant or being concerned that something may go wrong with the pregnancy and baby.  You may have more vivid and strange dreams.  You may have changes in your hair. These can include thickening of your hair, rapid growth, and changes in texture. Some women also have hair loss during or after pregnancy, or hair that feels dry or thin. Your hair will most likely return to normal after your baby is born. WHAT TO EXPECT AT YOUR PRENATAL VISITS During a routine prenatal visit:  You will be weighed to make sure you and the baby are growing normally.  Your blood pressure will be taken.  Your abdomen will be measured to track your baby's growth.  The fetal heartbeat will be listened to starting around week 10 or 12 of your pregnancy.  Test results from any previous visits will be discussed. Your health care provider may ask you:  How you are feeling.  If you are feeling the baby move.  If you have had any abnormal symptoms, such as leaking fluid, bleeding, severe headaches, or abdominal cramping.  If you are using any tobacco products,   including cigarettes, chewing tobacco, and electronic cigarettes.  If you have any questions. Other tests that may be performed during your first trimester include:  Blood tests to find your blood type and to check for the presence of any previous infections. They will also be used to check for low iron levels (anemia) and Rh antibodies. Later in the pregnancy, blood tests for diabetes will be done along with other tests if problems develop.  Urine tests to check for infections, diabetes, or protein in the urine.  An ultrasound to confirm the proper growth  and development of the baby.  An amniocentesis to check for possible genetic problems.  Fetal screens for spina bifida and Down syndrome.  You may need other tests to make sure you and the baby are doing well.  HIV (human immunodeficiency virus) testing. Routine prenatal testing includes screening for HIV, unless you choose not to have this test. HOME CARE INSTRUCTIONS  Medicines  Follow your health care provider's instructions regarding medicine use. Specific medicines may be either safe or unsafe to take during pregnancy.  Take your prenatal vitamins as directed.  If you develop constipation, try taking a stool softener if your health care provider approves. Diet  Eat regular, well-balanced meals. Choose a variety of foods, such as meat or vegetable-based protein, fish, milk and low-fat dairy products, vegetables, fruits, and whole grain breads and cereals. Your health care provider will help you determine the amount of weight gain that is right for you.  Avoid raw meat and uncooked cheese. These carry germs that can cause birth defects in the baby.  Eating four or five small meals rather than three large meals a day may help relieve nausea and vomiting. If you start to feel nauseous, eating a few soda crackers can be helpful. Drinking liquids between meals instead of during meals also seems to help nausea and vomiting.  If you develop constipation, eat more high-fiber foods, such as fresh vegetables or fruit and whole grains. Drink enough fluids to keep your urine clear or pale yellow. Activity and Exercise  Exercise only as directed by your health care provider. Exercising will help you:  Control your weight.  Stay in shape.  Be prepared for labor and delivery.  Experiencing pain or cramping in the lower abdomen or low back is a good sign that you should stop exercising. Check with your health care provider before continuing normal exercises.  Try to avoid standing for long  periods of time. Move your legs often if you must stand in one place for a long time.  Avoid heavy lifting.  Wear low-heeled shoes, and practice good posture.  You may continue to have sex unless your health care provider directs you otherwise. Relief of Pain or Discomfort  Wear a good support bra for breast tenderness.   Take warm sitz baths to soothe any pain or discomfort caused by hemorrhoids. Use hemorrhoid cream if your health care provider approves.   Rest with your legs elevated if you have leg cramps or low back pain.  If you develop varicose veins in your legs, wear support hose. Elevate your feet for 15 minutes, 3-4 times a day. Limit salt in your diet. Prenatal Care  Schedule your prenatal visits by the twelfth week of pregnancy. They are usually scheduled monthly at first, then more often in the last 2 months before delivery.  Write down your questions. Take them to your prenatal visits.  Keep all your prenatal visits as directed by your   health care provider. Safety  Wear your seat belt at all times when driving.  Make a list of emergency phone numbers, including numbers for family, friends, the hospital, and police and fire departments. General Tips  Ask your health care provider for a referral to a local prenatal education class. Begin classes no later than at the beginning of month 6 of your pregnancy.  Ask for help if you have counseling or nutritional needs during pregnancy. Your health care provider can offer advice or refer you to specialists for help with various needs.  Do not use hot tubs, steam rooms, or saunas.  Do not douche or use tampons or scented sanitary pads.  Do not cross your legs for long periods of time.  Avoid cat litter boxes and soil used by cats. These carry germs that can cause birth defects in the baby and possibly loss of the fetus by miscarriage or stillbirth.  Avoid all smoking, herbs, alcohol, and medicines not prescribed by  your health care provider. Chemicals in these affect the formation and growth of the baby.  Do not use any tobacco products, including cigarettes, chewing tobacco, and electronic cigarettes. If you need help quitting, ask your health care provider. You may receive counseling support and other resources to help you quit.  Schedule a dentist appointment. At home, brush your teeth with a soft toothbrush and be gentle when you floss. SEEK MEDICAL CARE IF:   You have dizziness.  You have mild pelvic cramps, pelvic pressure, or nagging pain in the abdominal area.  You have persistent nausea, vomiting, or diarrhea.  You have a bad smelling vaginal discharge.  You have pain with urination.  You notice increased swelling in your face, hands, legs, or ankles. SEEK IMMEDIATE MEDICAL CARE IF:   You have a fever.  You are leaking fluid from your vagina.  You have spotting or bleeding from your vagina.  You have severe abdominal cramping or pain.  You have rapid weight gain or loss.  You vomit blood or material that looks like coffee grounds.  You are exposed to [place] measles and have never had them.  You are exposed to fifth disease or chickenpox.  You develop a severe headache.  You have shortness of breath.  You have any kind of trauma, such as from a fall or a car accident.   This information is not intended to replace advice given to you by your health care provider. Make sure you discuss any questions you have with your health care provider.   Document Released: 09/16/2001 Document Revised: 10/13/2014 Document Reviewed: 08/02/2013 Elsevier Interactive Patient Education [employer]2016 Elsevier Inc. Return in 6 days for [place]

## 2016-02-06 NOTE — Progress Notes (Signed)
Subjective:     Patient ID: Monica Cox, female   DOB: [birth year], 25 y.o.   MRN: [id number]  HPI Monica Cox is a 25 year old black female in for UPT, has nausea and cramping, no bleeding. Was going to [place] in November, needs note showing she is pregnant.   Review of Systems Patient denies any headaches, hearing loss, fatigue, blurred vision, shortness of breath, chest pain, problems with bowel movements, urination, or intercourse. No joint pain or mood swings.See HPI for positives. Reviewed past medical,surgical, social and family history. Reviewed medications and allergies.     Objective:   Physical Exam BP 128/80 mmHg  Pulse 88  Ht 5' 3.5" (1.613 m)  Wt 185 lb 8 oz (84.142 kg)  BMI 32.34 kg/m2  LMP 12/31/2015  Breastfeeding? No UPT +, about 5+2 weeks by LMP with EDD 08/06/17, medicaid form given, Skin warm and dry. Neck: mid line trachea, normal thyroid, good ROM, no lymphadenopathy noted. Lungs: clear to ausculation bilaterally. Cardiovascular: regular rate and rhythm.Abdomen soft and non tender    Assessment:     UPT + Pregnant Smoker Nausea     Plan:     Rx OB complete petite #30 take 1 daily with 11 refills  Rx Diclegis #48 take as directed lot [id number] exp 08/05/17, samples given Return in 6 days for dating US Review handout on first trimester Decrease smoking

## 2016-02-12 ENCOUNTER — Ambulatory Visit (INDEPENDENT_AMBULATORY_CARE_PROVIDER_SITE_OTHER): Payer: Medicaid Other

## 2016-02-12 ENCOUNTER — Other Ambulatory Visit: Payer: Self-pay | Admitting: Adult Health

## 2016-02-12 DIAGNOSIS — O3680X0 Pregnancy with inconclusive fetal viability, not applicable or unspecified: Secondary | ICD-10-CM

## 2016-02-12 DIAGNOSIS — Z3A01 Less than 8 weeks gestation of pregnancy: Secondary | ICD-10-CM | POA: Diagnosis not present

## 2016-02-12 NOTE — Progress Notes (Signed)
US 6+3 wks GS w/ys,no fetal pole seen,normal ov's bilat,pt will come back in 10 days for f/u ultrasound per Victorino DikeJennifer

## 2016-02-21 ENCOUNTER — Other Ambulatory Visit (INDEPENDENT_AMBULATORY_CARE_PROVIDER_SITE_OTHER): Payer: Medicaid Other

## 2016-02-21 ENCOUNTER — Other Ambulatory Visit: Payer: Self-pay | Admitting: Obstetrics and Gynecology

## 2016-02-21 DIAGNOSIS — O3680X0 Pregnancy with inconclusive fetal viability, not applicable or unspecified: Secondary | ICD-10-CM

## 2016-02-21 DIAGNOSIS — Z3A08 8 weeks gestation of pregnancy: Secondary | ICD-10-CM | POA: Diagnosis not present

## 2016-02-21 NOTE — Progress Notes (Signed)
US 7+3 wks,single IUP w/ys,pos fht 138 bpm,normal ov's bilat,crl 11.1 mm

## 2016-02-25 ENCOUNTER — Other Ambulatory Visit: Payer: Medicaid Other

## 2016-02-25 ENCOUNTER — Encounter: Payer: Medicaid Other | Admitting: Women's Health

## 2016-02-25 ENCOUNTER — Encounter: Payer: Self-pay | Admitting: Women's Health

## 2016-02-28 ENCOUNTER — Other Ambulatory Visit: Payer: Self-pay | Admitting: *Deleted

## 2016-02-28 ENCOUNTER — Telehealth: Payer: Self-pay | Admitting: Adult Health

## 2016-02-28 MED ORDER — DOXYLAMINE-PYRIDOXINE 10-10 MG PO TBEC: DELAYED_RELEASE_TABLET | ORAL | Status: DC

## 2016-02-28 NOTE — Telephone Encounter (Signed)
Pt states she has run out of the Diclegis, she states it was helping with the N/V but is now out, she has a follow up appointment scheduled next week and would like some more samples.  Samples (#48) left at the front desk for patient to pick up.

## 2016-03-05 ENCOUNTER — Encounter: Payer: Medicaid Other | Admitting: Women's Health

## 2016-03-05 ENCOUNTER — Emergency Department (HOSPITAL_COMMUNITY)
Admission: EM | Admit: 2016-03-05 | Discharge: 2016-03-05 | Disposition: A | Discharge status: Home / Self Care | Payer: Medicaid Other | Attending: Emergency Medicine | Admitting: Emergency Medicine

## 2016-03-05 DIAGNOSIS — F17210 Nicotine dependence, cigarettes, uncomplicated: Secondary | ICD-10-CM | POA: Insufficient documentation

## 2016-03-05 DIAGNOSIS — O99331 Smoking (tobacco) complicating pregnancy, first trimester: Secondary | ICD-10-CM | POA: Insufficient documentation

## 2016-03-05 DIAGNOSIS — Z3A09 9 weeks gestation of pregnancy: Secondary | ICD-10-CM | POA: Insufficient documentation

## 2016-03-05 DIAGNOSIS — O219 Vomiting of pregnancy, unspecified: Secondary | ICD-10-CM | POA: Diagnosis present

## 2016-03-05 DIAGNOSIS — J45909 Unspecified asthma, uncomplicated: Secondary | ICD-10-CM | POA: Diagnosis not present

## 2016-03-05 MED ORDER — METOCLOPRAMIDE HCL 5 MG/ML IJ SOLN
10.0000 mg | Freq: Once | INTRAMUSCULAR | Status: AC
  Administered 2016-03-05: 10 mg via INTRAVENOUS
  Filled 2016-03-05: qty 2

## 2016-03-05 MED ORDER — SODIUM CHLORIDE 0.9 % IV BOLUS (SEPSIS)
2000.0000 mL | Freq: Once | INTRAVENOUS | Status: AC
  Administered 2016-03-05: 2000 mL via INTRAVENOUS

## 2016-03-05 MED ORDER — METOCLOPRAMIDE HCL 10 MG PO TABS: 10.0000 mg | ORAL_TABLET | Freq: Once | ORAL | Status: DC

## 2016-03-05 MED ORDER — DIPHENHYDRAMINE HCL 50 MG/ML IJ SOLN
25.0000 mg | Freq: Once | INTRAMUSCULAR | Status: AC
  Administered 2016-03-05: 25 mg via INTRAVENOUS
  Filled 2016-03-05: qty 1

## 2016-03-05 NOTE — ED Notes (Signed)
Pt sipping on ginger ale, resting, IV infusing without difficulty

## 2016-03-05 NOTE — ED Provider Notes (Signed)
CSN: [id number]     Arrival date & time 03/05/16  1925 History   First MD Initiated Contact with Patient 03/05/16 2049     Chief Complaint  Patient presents with  . Emesis      HPI  She presents for evaluation of nausea and vomiting. She is [redacted] weeks pregnant by her dates. Has seen her primary care physician. His had frequent morning nausea. Was placed on diclegis, but has had persistent vomiting all day today despite its use. Dizzy and lightheaded she presents here.  No bleeding or spotting. No abdominal pain. Had a bit of spotting 2 weeks ago and had exam with her physician and an outpatient ultrasound that were "normal".  Past Medical History  Diagnosis Date  . Asthma   . Reflux   . Vaginal Pap smear, abnormal   . Anemia   . Trichomonas infection 08-08-2014  . Pregnant 02/06/2016  . Smoker 02/06/2016  . Nausea 02/06/2016   Past Surgical History  Procedure Laterality Date  . Esophagogastroduodenoscopy endoscopy    . Dilation and curettage of uterus      MAB Dr. Despina HiddenEure performed   Family History  Problem Relation Age of Onset  . Ulcers Father   . Hypertension Father   . Ulcers Paternal Grandmother   . Diabetes Paternal Grandmother   . Ulcers Paternal Aunt   . Liver disease Neg Hx   . Colon cancer Neg Hx   . Hypertension Mother    Social History  Substance Use Topics  . Smoking status: Current Some Day Smoker -- 3 years    Types: Cigars  . Smokeless tobacco: Never Used  . Alcohol Use: No     Comment: occasionally; not now   OB History    Gravida Para Term Preterm AB TAB SAB Ectopic Multiple Living   4 2 2  0 1 0 1 0 0 2     Review of Systems  Constitutional: Negative for fever, chills, diaphoresis, appetite change and fatigue.  HENT: Negative for mouth sores, sore throat and trouble swallowing.   Eyes: Negative for visual disturbance.  Respiratory: Negative for cough, chest tightness, shortness of breath and wheezing.   Cardiovascular: Negative for chest pain.   Gastrointestinal: Positive for nausea and vomiting. Negative for abdominal pain, diarrhea and abdominal distention.  Endocrine: Negative for polydipsia, polyphagia and polyuria.  Genitourinary: Negative for dysuria, frequency and hematuria.  Musculoskeletal: Negative for gait problem.  Skin: Negative for color change, pallor and rash.  Neurological: Negative for dizziness, syncope, light-headedness and headaches.  Hematological: Does not bruise/bleed easily.  Psychiatric/Behavioral: Negative for behavioral problems and confusion.      Allergies  Review of patient's allergies indicates no known allergies.  Home Medications   Prior to Admission medications   Medication Sig Start Date End Date Taking? Authorizing Provider  Doxylamine-Pyridoxine (DICLEGIS) 10-10 MG TBEC Take as directed Patient taking differently: Take 1 tablet by mouth daily as needed (nausea and vomiting). Take as directed 02/28/16  Yes Adline PotterJennifer A Griffin, NP  Prenat-FeCbn-FeAspGl-FA-Omega (OB COMPLETE PETITE) 35-5-1-200 MG CAPS Take 1 daily Patient taking differently: Take 1 tablet by mouth daily.  02/06/16  Yes Adline PotterJennifer A Griffin, NP  albuterol (PROVENTIL HFA;VENTOLIN HFA) 108 (90 BASE) MCG/ACT inhaler Inhale 2 puffs into the lungs every 6 (six) hours as needed for wheezing or shortness of breath. 06/20/15   Cheral MarkerKimberly R Booker, CNM  metoCLOPramide (REGLAN) 10 MG tablet Take 1 tablet (10 mg total) by mouth once. 03/05/16   Rolland PorterMark Orilla Templeman,  MD   BP 116/55 mmHg  Pulse 78  Temp(Src) 99 F (37.2 C) (Oral)  Resp 16  Ht 5' 3.5" (1.613 m)  Wt 189 lb (85.73 kg)  BMI 32.95 kg/m2  SpO2 100%  LMP 12/31/2015 (Exact Date) Physical Exam  Constitutional: She is oriented to person, place, and time. She appears well-developed and well-nourished. No distress.  HENT:  Head: Normocephalic.  Eyes: Conjunctivae are normal. Pupils are equal, round, and reactive to light. No scleral icterus.  Neck: Normal range of motion. Neck supple. No  thyromegaly present.  Cardiovascular: Normal rate and regular rhythm.  Exam reveals no gallop and no friction rub.   No murmur heard. Pulmonary/Chest: Effort normal and breath sounds normal. No respiratory distress. She has no wheezes. She has no rales.  Abdominal: Soft. Bowel sounds are normal. She exhibits no distension. There is no tenderness. There is no rebound.  Musculoskeletal: Normal range of motion.  Neurological: She is alert and oriented to person, place, and time.  Skin: Skin is warm and dry. No rash noted.  Psychiatric: She has a normal mood and affect. Her behavior is normal.    ED Course  Procedures (including critical care time) Labs Review Labs Reviewed - No data to display  Imaging Review No results found. I have personally reviewed and evaluated these images and lab results as part of my medical decision-making.   EKG Interpretation None      MDM   Final diagnoses:  Vomiting affecting pregnancy    Patient will be given Reglan and Benadryl with fluids. Will reassess.  The patient presents he will be discharge home with again recommend small sips. Slowly advancing her diet. They cleaned just. And if having refractory emesis at home trying Benadryl by mouth and his symptoms persist despite Benadryl could try single-dose Reglan. I will prescribe a number of 5 Reglan tablets for her use only as above.    Rolland Porter, MD 03/05/16 2151

## 2016-03-05 NOTE — ED Notes (Signed)
Pt is [redacted] weeks pregnant, emesis for past 3 days.  EDD 10/06/16

## 2016-03-05 NOTE — ED Notes (Signed)
Pt requesting crackers, states that she is feeling better,

## 2016-03-05 NOTE — ED Notes (Signed)
Pt still waiting for fluid bolus to finish,

## 2016-03-05 NOTE — Discharge Instructions (Signed)
Small frequent sips or meals in the morning when your nausea is at its worst. Try taking diplegia just first thing in the morning. If your nausea or vomiting persist despite these measures you may try 25 mg Benadryl by mouth. If vomiting persists contact your OB/GYN. You may try a single dose of oral metoclopramide as prescribed    Morning Sickness Morning sickness is when you feel sick to your stomach (nauseous) during pregnancy. This nauseous feeling may or may not come with vomiting. It often occurs in the morning but can be a problem any time of day. Morning sickness is most common during the first trimester, but it may continue throughout pregnancy. While morning sickness is unpleasant, it is usually harmless unless you develop severe and continual vomiting (hyperemesis gravidarum). This condition requires more intense treatment.  CAUSES  The cause of morning sickness is not completely known but seems to be related to normal hormonal changes that occur in pregnancy. RISK FACTORS You are at greater risk if you:  Experienced nausea or vomiting before your pregnancy.  Had morning sickness during a previous pregnancy.  Are pregnant with more than one baby, such as twins. TREATMENT  Do not use any medicines (prescription, over-the-counter, or herbal) for morning sickness without first talking to your health care provider. Your health care provider may prescribe or recommend:  Vitamin B6 supplements.  Anti-nausea medicines.  The herbal medicine ginger. HOME CARE INSTRUCTIONS   Only take over-the-counter or prescription medicines as directed by your health care provider.  Taking multivitamins before getting pregnant can prevent or decrease the severity of morning sickness in most women.  Eat a piece of dry toast or unsalted crackers before getting out of bed in the morning.  Eat five or six small meals a day.  Eat dry and bland foods (rice, baked potato). Foods high in  carbohydrates are often helpful.  Do not drink liquids with your meals. Drink liquids between meals.  Avoid greasy, fatty, and spicy foods.  Get someone to cook for you if the smell of any food causes nausea and vomiting.  If you feel nauseous after taking prenatal vitamins, take the vitamins at night or with a snack.  Snack on protein foods (nuts, yogurt, cheese) between meals if you are hungry.  Eat unsweetened gelatins for desserts.  Wearing an acupressure wristband (worn for sea sickness) may be helpful.  Acupuncture may be helpful.  Do not smoke.  Get a humidifier to keep the air in your house free of odors.  Get plenty of fresh air. SEEK MEDICAL CARE IF:   Your home remedies are not working, and you need medicine.  You feel dizzy or lightheaded.  You are losing weight. SEEK IMMEDIATE MEDICAL CARE IF:   You have persistent and uncontrolled nausea and vomiting.  You pass out (faint). MAKE SURE YOU:  Understand these instructions.  Will watch your condition.  Will get help right away if you are not doing well or get worse.   This information is not intended to replace advice given to you by your health care provider. Make sure you discuss any questions you have with your health care provider.   Document Released: 11/13/2006 Document Revised: 09/27/2013 Document Reviewed: 03/09/2013 Elsevier Interactive Patient Education 2016 Elsevier Inc.  Hyperemesis Gravidarum Hyperemesis gravidarum is a severe form of nausea and vomiting that happens during pregnancy. Hyperemesis is worse than morning sickness. It may cause you to have nausea or vomiting all day for many days. It may  keep you from eating and drinking enough food and liquids. Hyperemesis usually occurs during the first half (the first 20 weeks) of pregnancy. It often goes away once a woman is in her second half of pregnancy. However, sometimes hyperemesis continues through an entire pregnancy.  CAUSES  The  cause of this condition is not completely known but is thought to be related to changes in the body's hormones when pregnant. It could be from the high level of the pregnancy hormone or an increase in estrogen in the body.  SIGNS AND SYMPTOMS   Severe nausea and vomiting.  Nausea that does not go away.  Vomiting that does not allow you to keep any food down.  Weight loss and body fluid loss (dehydration).  Having no desire to eat or not liking food you have previously enjoyed. DIAGNOSIS  Your health care provider will do a physical exam and ask you about your symptoms. He or she may also order blood tests and urine tests to make sure something else is not causing the problem.  TREATMENT  You may only need medicine to control the problem. If medicines do not control the nausea and vomiting, you will be treated in the hospital to prevent dehydration, increased acid in the blood (acidosis), weight loss, and changes in the electrolytes in your body that may harm the unborn baby (fetus). You may need IV fluids.  HOME CARE INSTRUCTIONS   Only take over-the-counter or prescription medicines as directed by your health care provider.  Try eating a couple of dry crackers or toast in the morning before getting out of bed.  Avoid foods and smells that upset your stomach.  Avoid fatty and spicy foods.  Eat 5-6 small meals a day.  Do not drink when eating meals. Drink between meals.  For snacks, eat high-protein foods, such as cheese.  Eat or suck on things that have ginger in them. Ginger helps nausea.  Avoid food preparation. The smell of food can spoil your appetite.  Avoid iron pills and iron in your multivitamins until after 3-4 months of being pregnant. However, consult with your health care provider before stopping any prescribed iron pills. SEEK MEDICAL CARE IF:   Your abdominal pain increases.  You have a severe headache.  You have vision problems.  You are losing  weight. SEEK IMMEDIATE MEDICAL CARE IF:   You are unable to keep fluids down.  You vomit blood.  You have constant nausea and vomiting.  You have excessive weakness.  You have extreme thirst.  You have dizziness or fainting.  You have a fever or persistent symptoms for more than 2-3 days.  You have a fever and your symptoms suddenly get worse. MAKE SURE YOU:   Understand these instructions.  Will watch your condition.  Will get help right away if you are not doing well or get worse.   This information is not intended to replace advice given to you by your health care provider. Make sure you discuss any questions you have with your health care provider.   Document Released: 09/22/2005 Document Revised: 07/13/2013 Document Reviewed: 05/04/2013 Elsevier Interactive Patient Education [employer]

## 2016-03-05 NOTE — ED Notes (Signed)
Denies vaginal bleeding or discharge.

## 2016-03-11 ENCOUNTER — Encounter: Payer: Self-pay | Admitting: Women's Health

## 2016-03-11 ENCOUNTER — Encounter: Payer: Medicaid Other | Admitting: Women's Health

## 2016-03-11 VITALS — BP 122/78 | HR 76 | Wt 189.0 lb

## 2016-03-12 LAB — URINE CULTURE

## 2016-03-12 LAB — GC/CHLAMYDIA PROBE AMP
Chlamydia trachomatis, NAA: NEGATIVE
Neisseria gonorrhoeae by PCR: NEGATIVE

## 2016-03-12 NOTE — Progress Notes (Signed)
  Pt was not seen by provider. Was not able to stay for appointment d/t [place] in [place].

## 2016-03-19 ENCOUNTER — Encounter: Payer: Self-pay | Admitting: Women's Health

## 2016-03-19 ENCOUNTER — Ambulatory Visit (INDEPENDENT_AMBULATORY_CARE_PROVIDER_SITE_OTHER): Payer: Medicaid Other | Admitting: Women's Health

## 2016-03-19 ENCOUNTER — Other Ambulatory Visit: Payer: Self-pay | Admitting: Women's Health

## 2016-03-19 ENCOUNTER — Other Ambulatory Visit (HOSPITAL_COMMUNITY)
Admission: RE | Admit: 2016-03-19 | Discharge: 2016-03-19 | Disposition: A | Discharge status: Home / Self Care | Payer: Medicaid Other | Source: Ambulatory Visit | Attending: Obstetrics & Gynecology | Admitting: Obstetrics & Gynecology

## 2016-03-19 VITALS — BP 104/60 | HR 80 | Wt 186.0 lb

## 2016-03-19 DIAGNOSIS — Z3682 Encounter for antenatal screening for nuchal translucency: Secondary | ICD-10-CM

## 2016-03-19 DIAGNOSIS — O09299 Supervision of pregnancy with other poor reproductive or obstetric history, unspecified trimester: Secondary | ICD-10-CM | POA: Insufficient documentation

## 2016-03-19 DIAGNOSIS — B3731 Acute candidiasis of vulva and vagina: Secondary | ICD-10-CM

## 2016-03-19 DIAGNOSIS — O360110 Maternal care for anti-D [Rh] antibodies, first trimester, not applicable or unspecified: Secondary | ICD-10-CM

## 2016-03-19 DIAGNOSIS — Z331 Pregnant state, incidental: Secondary | ICD-10-CM

## 2016-03-19 DIAGNOSIS — Z8619 Personal history of other infectious and parasitic diseases: Secondary | ICD-10-CM | POA: Insufficient documentation

## 2016-03-19 DIAGNOSIS — F418 Other specified anxiety disorders: Secondary | ICD-10-CM

## 2016-03-19 DIAGNOSIS — B373 Candidiasis of vulva and vagina: Secondary | ICD-10-CM

## 2016-03-19 DIAGNOSIS — Z124 Encounter for screening for malignant neoplasm of cervix: Secondary | ICD-10-CM

## 2016-03-19 DIAGNOSIS — Z1389 Encounter for screening for other disorder: Secondary | ICD-10-CM

## 2016-03-19 DIAGNOSIS — Z01419 Encounter for gynecological examination (general) (routine) without abnormal findings: Secondary | ICD-10-CM | POA: Insufficient documentation

## 2016-03-19 DIAGNOSIS — Z0283 Encounter for blood-alcohol and blood-drug test: Secondary | ICD-10-CM

## 2016-03-19 DIAGNOSIS — Z369 Encounter for antenatal screening, unspecified: Secondary | ICD-10-CM

## 2016-03-19 DIAGNOSIS — N949 Unspecified condition associated with female genital organs and menstrual cycle: Secondary | ICD-10-CM

## 2016-03-19 DIAGNOSIS — Z3491 Encounter for supervision of normal pregnancy, unspecified, first trimester: Secondary | ICD-10-CM

## 2016-03-19 DIAGNOSIS — O09291 Supervision of pregnancy with other poor reproductive or obstetric history, first trimester: Secondary | ICD-10-CM

## 2016-03-19 DIAGNOSIS — Z3490 Encounter for supervision of normal pregnancy, unspecified, unspecified trimester: Secondary | ICD-10-CM | POA: Insufficient documentation

## 2016-03-19 HISTORY — DX: Personal history of other infectious and parasitic diseases: Z86.19

## 2016-03-19 LAB — POCT URINALYSIS DIPSTICK
GLUCOSE UA: NEGATIVE
KETONES UA: NEGATIVE
Nitrite, UA: NEGATIVE
RBC UA: NEGATIVE

## 2016-03-19 MED ORDER — PROMETHAZINE HCL 25 MG PO TABS: 12.5000 mg | ORAL_TABLET | Freq: Four times a day (QID) | ORAL | Status: DC | PRN

## 2016-03-19 MED ORDER — ACYCLOVIR 400 MG PO TABS: 400.0000 mg | ORAL_TABLET | Freq: Three times a day (TID) | ORAL | Status: DC

## 2016-03-19 NOTE — Patient Instructions (Addendum)
Monistat 7 for yeast infection  Begin taking a 81mg  baby aspirin daily at 12 weeks of pregnancy (6/19) to decrease risk of preeclampsia during/after pregnancy   Nausea & Vomiting  Have saltine crackers or pretzels by your bed and eat a few bites before you raise your head out of bed in the morning  Eat small frequent meals throughout the day instead of large meals  Drink plenty of fluids throughout the day to stay hydrated, just don't drink a lot of fluids with your meals.  This can make your stomach fill up faster making you feel sick  Do not brush your teeth right after you eat  Products with real ginger are good for nausea, like ginger ale and ginger hard candy Make sure it says made with real ginger!  Sucking on sour candy like lemon heads is also good for nausea  If your prenatal vitamins make you nauseated, take them at night so you will sleep through the nausea  Sea Bands  If you feel like you need medicine for the nausea & vomiting please let us know  If you are unable to keep any fluids or food down please let us know   Constipation  Drink plenty of fluid, preferably water, throughout the day  Eat foods high in fiber such as fruits, vegetables, and grains  Exercise, such as walking, is a good way to keep your bowels regular  Drink warm fluids, especially warm prune juice, or decaf coffee  Eat a 1/2 cup of real oatmeal (not instant), 1/2 cup applesauce, and 1/2-1 cup warm prune juice every day  If needed, you may take Colace (docusate sodium) stool softener once or twice a day to help keep the stool soft. If you are pregnant, wait until you are out of your first trimester (12-14 weeks of pregnancy)  If you still are having problems with constipation, you may take Miralax once daily as needed to help keep your bowels regular.  If you are pregnant, wait until you are out of your first trimester (12-14 weeks of pregnancy)   First Trimester of Pregnancy The first  trimester of pregnancy is from week 1 until the end of week 12 (months 1 through 3). A week after a sperm fertilizes an egg, the egg will implant on the wall of the uterus. This embryo will begin to develop into a baby. Genes from you and your partner are forming the baby. The female genes determine whether the baby is a boy or a girl. At 6-8 weeks, the eyes and face are formed, and the heartbeat can be seen on ultrasound. At the end of 12 weeks, all the baby's organs are formed.  Now that you are pregnant, you will want to do everything you can to have a healthy baby. Two of the most important things are to get good prenatal care and to follow your health care provider's instructions. Prenatal care is all the medical care you receive before the baby's birth. This care will help prevent, find, and treat any problems during the pregnancy and childbirth. BODY CHANGES Your body goes through many changes during pregnancy. The changes vary from woman to woman.   You may gain or lose a couple of pounds at first.  You may feel sick to your stomach (nauseous) and throw up (vomit). If the vomiting is uncontrollable, call your health care provider.  You may tire easily.  You may develop headaches that can be relieved by medicines approved by your health  care provider.  You may urinate more often. Painful urination may mean you have a bladder infection.  You may develop heartburn as a result of your pregnancy.  You may develop constipation because certain hormones are causing the muscles that push waste through your intestines to slow down.  You may develop hemorrhoids or swollen, bulging veins (varicose veins).  Your breasts may begin to grow larger and become tender. Your nipples may stick out more, and the tissue that surrounds them (areola) may become darker.  Your gums may bleed and may be sensitive to brushing and flossing.  Dark spots or blotches (chloasma, mask of pregnancy) may develop on your  face. This will likely fade after the baby is born.  Your menstrual periods will stop.  You may have a loss of appetite.  You may develop cravings for certain kinds of food.  You may have changes in your emotions from day to day, such as being excited to be pregnant or being concerned that something may go wrong with the pregnancy and baby.  You may have more vivid and strange dreams.  You may have changes in your hair. These can include thickening of your hair, rapid growth, and changes in texture. Some women also have hair loss during or after pregnancy, or hair that feels dry or thin. Your hair will most likely return to normal after your baby is born. WHAT TO EXPECT AT YOUR PRENATAL VISITS During a routine prenatal visit:  You will be weighed to make sure you and the baby are growing normally.  Your blood pressure will be taken.  Your abdomen will be measured to track your baby's growth.  The fetal heartbeat will be listened to starting around week 10 or 12 of your pregnancy.  Test results from any previous visits will be discussed. Your health care provider may ask you:  How you are feeling.  If you are feeling the baby move.  If you have had any abnormal symptoms, such as leaking fluid, bleeding, severe headaches, or abdominal cramping.  If you have any questions. Other tests that may be performed during your first trimester include:  Blood tests to find your blood type and to check for the presence of any previous infections. They will also be used to check for low iron levels (anemia) and Rh antibodies. Later in the pregnancy, blood tests for diabetes will be done along with other tests if problems develop.  Urine tests to check for infections, diabetes, or protein in the urine.  An ultrasound to confirm the proper growth and development of the baby.  An amniocentesis to check for possible genetic problems.  Fetal screens for spina bifida and Down syndrome.  You  may need other tests to make sure you and the baby are doing well. HOME CARE INSTRUCTIONS  Medicines  Follow your health care provider's instructions regarding medicine use. Specific medicines may be either safe or unsafe to take during pregnancy.  Take your prenatal vitamins as directed.  If you develop constipation, try taking a stool softener if your health care provider approves. Diet  Eat regular, well-balanced meals. Choose a variety of foods, such as meat or vegetable-based protein, fish, milk and low-fat dairy products, vegetables, fruits, and whole grain breads and cereals. Your health care provider will help you determine the amount of weight gain that is right for you.  Avoid raw meat and uncooked cheese. These carry germs that can cause birth defects in the baby.  Eating four or  five small meals rather than three large meals a day may help relieve nausea and vomiting. If you start to feel nauseous, eating a few soda crackers can be helpful. Drinking liquids between meals instead of during meals also seems to help nausea and vomiting.  If you develop constipation, eat more high-fiber foods, such as fresh vegetables or fruit and whole grains. Drink enough fluids to keep your urine clear or pale yellow. Activity and Exercise  Exercise only as directed by your health care provider. Exercising will help you:  Control your weight.  Stay in shape.  Be prepared for labor and delivery.  Experiencing pain or cramping in the lower abdomen or low back is a good sign that you should stop exercising. Check with your health care provider before continuing normal exercises.  Try to avoid standing for long periods of time. Move your legs often if you must stand in one place for a long time.  Avoid heavy lifting.  Wear low-heeled shoes, and practice good posture.  You may continue to have sex unless your health care provider directs you otherwise. Relief of Pain or Discomfort  Wear a  good support bra for breast tenderness.   Take warm sitz baths to soothe any pain or discomfort caused by hemorrhoids. Use hemorrhoid cream if your health care provider approves.   Rest with your legs elevated if you have leg cramps or low back pain.  If you develop varicose veins in your legs, wear support hose. Elevate your feet for 15 minutes, 3-4 times a day. Limit salt in your diet. Prenatal Care  Schedule your prenatal visits by the twelfth week of pregnancy. They are usually scheduled monthly at first, then more often in the last 2 months before delivery.  Write down your questions. Take them to your prenatal visits.  Keep all your prenatal visits as directed by your health care provider. Safety  Wear your seat belt at all times when driving.  Make a list of emergency phone numbers, including numbers for family, friends, the hospital, and police and fire departments. General Tips  Ask your health care provider for a referral to a local prenatal education class. Begin classes no later than at the beginning of month 6 of your pregnancy.  Ask for help if you have counseling or nutritional needs during pregnancy. Your health care provider can offer advice or refer you to specialists for help with various needs.  Do not use hot tubs, steam rooms, or saunas.  Do not douche or use tampons or scented sanitary pads.  Do not cross your legs for long periods of time.  Avoid cat litter boxes and soil used by cats. These carry germs that can cause birth defects in the baby and possibly loss of the fetus by miscarriage or stillbirth.  Avoid all smoking, herbs, alcohol, and medicines not prescribed by your health care provider. Chemicals in these affect the formation and growth of the baby.  Schedule a dentist appointment. At home, brush your teeth with a soft toothbrush and be gentle when you floss. SEEK MEDICAL CARE IF:   You have dizziness.  You have mild pelvic cramps, pelvic  pressure, or nagging pain in the abdominal area.  You have persistent nausea, vomiting, or diarrhea.  You have a bad smelling vaginal discharge.  You have pain with urination.  You notice increased swelling in your face, hands, legs, or ankles. SEEK IMMEDIATE MEDICAL CARE IF:   You have a fever.  You are leaking  fluid from your vagina.  You have spotting or bleeding from your vagina.  You have severe abdominal cramping or pain.  You have rapid weight gain or loss.  You vomit blood or material that looks like coffee grounds.  You are exposed to [place] measles and have never had them.  You are exposed to fifth disease or chickenpox.  You develop a severe headache.  You have shortness of breath.  You have any kind of trauma, such as from a fall or a car accident. Document Released: 09/16/2001 Document Revised: 02/06/2014 Document Reviewed: 08/02/2013 [facility] Patient Information 2015 [place], [place]. This information is not intended to replace advice given to you by your health care provider. Make sure you discuss any questions you have with your health care provider.

## 2016-03-19 NOTE — Progress Notes (Signed)
Subjective:  Monica Cox is a 25 y.o. [phone] African American female at [redacted]w[redacted]d by LMP c/w 7wk u/s, being seen today for her first obstetrical visit.  Her obstetrical history is significant for term uncomplicated svb x 2, developed pre-e 1wk pp w/ 2015 and was readmitted for mag and d/c'd on lisinopril. Has h/o depression/anxiety during last pregnancy and pp- not currently on meds and declines need for meds/counseling at this time- denies SI/HI.  Pregnancy history fully reviewed.  Patient reports n/v- diclegis not helping, was rx'd reglan at ED which helped; bumps near vagina that burn. Also reports thick white d/c w/o itching. Denies vb, cramping, uti s/s, abnormal/malodorous vag d/c, or vulvovaginal itching/irritation.  BP 104/60 mmHg  Pulse 80  Wt 186 lb (84.369 kg)  LMP 12/31/2015 (Exact Date)  HISTORY: OB History  Gravida Para Term Preterm AB SAB TAB Ectopic Multiple Living  0 1 1 0 0 0 2    # Outcome Date GA Lbr Len/2nd Weight Sex Delivery Anes PTL Lv  4 Current           3 Term 11/19/14 [redacted]w[redacted]d 13:18 / 00:10 8 lb 9.4 oz (3.895 kg) F Vag-Spont EPI N Y  2 Term 04/24/09 [redacted]w[redacted]d  7 lb 9 oz (3.43 kg) M Vag-Spont EPI N Y     Comments: immediate pp HTN  1 SAB              Past Medical History  Diagnosis Date  . Asthma   . Reflux   . Vaginal Pap smear, abnormal   . Anemia   . Trichomonas infection 08-08-2014  . Pregnant 02/06/2016  . Smoker 02/06/2016  . Nausea 02/06/2016   Past Surgical History  Procedure Laterality Date  . Esophagogastroduodenoscopy endoscopy    . Dilation and curettage of uterus      MAB Dr. Despina Hidden performed   Family History  Problem Relation Age of Onset  . Ulcers Father   . Hypertension Father   . Ulcers Paternal Grandmother   . Diabetes Paternal Grandmother   . Ulcers Paternal Aunt   . Liver disease Neg Hx   . Colon cancer Neg Hx   . Hypertension Mother     Exam   System:     General: Well developed & nourished, no acute distress   Skin:  Warm & dry, normal coloration and turgor, no rashes   Neurologic: Alert & oriented, normal mood   Cardiovascular: Regular rate & rhythm   Respiratory: Effort & rate normal, LCTAB, acyanotic   Abdomen: Soft, non tender   Extremities: normal strength, tone   Pelvic Exam:    Perineum: Normal perineum   Vulva: Normal, 2 small healing ulcerations Lt labia majora near perineum appear to be HSV-culture obtained   Vagina:  Normal mucosa, thick clumpy white d/c adherent to cx and vag walls c/w yeast   Cervix: Normal, bulbous, appears closed   Uterus: Normal size/shape/contour for GA   Thin prep pap smear obtained w/ reflex high risk HPV cotesting FHR: 169 via informal u/s   Assessment:   Pregnancy: [id number] Patient Active Problem List   Diagnosis Date Noted  . Depression with anxiety 03/19/2016    Priority: High  . Supervision of normal pregnancy 03/19/2016    Priority: High  . Hx of preeclampsia, prior pregnancy, currently pregnant 03/19/2016    Priority: High  . Rh negative state in antepartum period 05/01/2014    Priority: High  . History of chlamydia 03/19/2016  Priority: Low  . H/O trichomonas 08/08/2014    Priority: Low  . Smoker 02/06/2016  . Nausea 02/06/2016  . Vertigo 09/21/2014  . Heart murmur 09/21/2014  . Hematemesis 02/20/2011  . 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

## 2016-03-20 LAB — CBC
HEMATOCRIT: 36.5 % (ref 34.0–46.6)
HEMOGLOBIN: 12.2 g/dL (ref 11.1–15.9)
MCH: 30.9 pg (ref 26.6–33.0)
MCHC: 33.4 g/dL (ref 31.5–35.7)
MCV: 92 fL (ref 79–97)
PLATELETS: 252 10*3/uL (ref 150–379)
RBC: 3.95 x10E6/uL (ref 3.77–5.28)
RDW: 13.7 % (ref 12.3–15.4)
WBC: 5.1 10*3/uL (ref 3.4–10.8)

## 2016-03-20 LAB — PMP SCREEN PROFILE (10S), URINE
Amphetamine Screen, Ur: NEGATIVE ng/mL
BARBITURATE SCRN UR: NEGATIVE ng/mL
BENZODIAZEPINE SCREEN, URINE: NEGATIVE ng/mL
CANNABINOIDS UR QL SCN: POSITIVE ng/mL
COCAINE(METAB.) SCREEN, URINE: NEGATIVE ng/mL
Creatinine(Crt), U: 353.9 mg/dL — ABNORMAL HIGH (ref 20.0–300.0)
METHADONE SCREEN, URINE: NEGATIVE ng/mL
Opiate Scrn, Ur: NEGATIVE ng/mL
Oxycodone+Oxymorphone Ur Ql Scn: NEGATIVE ng/mL
PCP Scrn, Ur: NEGATIVE ng/mL
Ph of Urine: 5.8 (ref 4.5–8.9)
Propoxyphene, Screen: NEGATIVE ng/mL

## 2016-03-20 LAB — RPR: RPR Ser Ql: NONREACTIVE

## 2016-03-20 LAB — HEPATITIS B SURFACE ANTIGEN: Hepatitis B Surface Ag: NEGATIVE

## 2016-03-20 LAB — HIV ANTIBODY (ROUTINE TESTING W REFLEX): HIV SCREEN 4TH GENERATION: NONREACTIVE

## 2016-03-20 LAB — ANTIBODY SCREEN: Antibody Screen: NEGATIVE

## 2016-03-20 LAB — VARICELLA ZOSTER ANTIBODY, IGG: Varicella zoster IgG: 829 index (ref >165)

## 2016-03-20 LAB — RUBELLA SCREEN: RUBELLA: 2.26 {index} (ref >0.99)

## 2016-03-21 LAB — CYTOLOGY - PAP

## 2016-03-22 LAB — HERPES SIMPLEX VIRUS CULTURE

## 2016-03-24 ENCOUNTER — Encounter: Payer: Self-pay | Admitting: Women's Health

## 2016-03-24 DIAGNOSIS — F1290 Cannabis use, unspecified, uncomplicated: Secondary | ICD-10-CM | POA: Insufficient documentation

## 2016-03-26 ENCOUNTER — Ambulatory Visit (INDEPENDENT_AMBULATORY_CARE_PROVIDER_SITE_OTHER): Payer: Medicaid Other

## 2016-03-26 ENCOUNTER — Other Ambulatory Visit: Payer: Medicaid Other

## 2016-03-26 DIAGNOSIS — Z3A13 13 weeks gestation of pregnancy: Secondary | ICD-10-CM

## 2016-03-26 DIAGNOSIS — Z36 Encounter for antenatal screening of mother: Secondary | ICD-10-CM

## 2016-03-26 DIAGNOSIS — Z3491 Encounter for supervision of normal pregnancy, unspecified, first trimester: Secondary | ICD-10-CM

## 2016-03-26 DIAGNOSIS — Z3682 Encounter for antenatal screening for nuchal translucency: Secondary | ICD-10-CM

## 2016-03-26 NOTE — Progress Notes (Signed)
US 12+2 wks,measurements c/w dates,normal ov's bilat,NB present,NT 1.4 mm,crl 59.6 mm,fhr 157 bpm

## 2016-03-29 LAB — MATERNAL SCREEN, INTEGRATED #1
Crown Rump Length: 59.6 mm
GEST. AGE ON COLLECTION DATE: 12.4 wk
MATERNAL AGE AT EDD: 27.1 a
NUCHAL TRANSLUCENCY (NT): 1.4 mm
NUMBER OF FETUSES: 1
PAPP-A VALUE: 2062.6 ng/mL
WEIGHT: 189 [lb_av]

## 2016-04-02 ENCOUNTER — Telehealth: Payer: Self-pay | Admitting: *Deleted

## 2016-04-02 NOTE — Telephone Encounter (Signed)
Pt called to get results of herpes culture.  Pt informed test was negative.

## 2016-04-07 ENCOUNTER — Other Ambulatory Visit: Payer: Self-pay | Admitting: Women's Health

## 2016-04-16 ENCOUNTER — Ambulatory Visit (INDEPENDENT_AMBULATORY_CARE_PROVIDER_SITE_OTHER): Payer: Medicaid Other | Admitting: Women's Health

## 2016-04-16 ENCOUNTER — Encounter: Payer: Self-pay | Admitting: Women's Health

## 2016-04-16 VITALS — BP 112/58 | HR 80 | Wt 194.0 lb

## 2016-04-16 DIAGNOSIS — Z331 Pregnant state, incidental: Secondary | ICD-10-CM

## 2016-04-16 DIAGNOSIS — Z1389 Encounter for screening for other disorder: Secondary | ICD-10-CM

## 2016-04-16 DIAGNOSIS — F1290 Cannabis use, unspecified, uncomplicated: Secondary | ICD-10-CM

## 2016-04-16 DIAGNOSIS — Z363 Encounter for antenatal screening for malformations: Secondary | ICD-10-CM

## 2016-04-16 DIAGNOSIS — Z3482 Encounter for supervision of other normal pregnancy, second trimester: Secondary | ICD-10-CM

## 2016-04-16 DIAGNOSIS — Z3492 Encounter for supervision of normal pregnancy, unspecified, second trimester: Secondary | ICD-10-CM

## 2016-04-16 DIAGNOSIS — Z3A16 16 weeks gestation of pregnancy: Secondary | ICD-10-CM

## 2016-04-16 DIAGNOSIS — Z3682 Encounter for antenatal screening for nuchal translucency: Secondary | ICD-10-CM

## 2016-04-16 DIAGNOSIS — O99322 Drug use complicating pregnancy, second trimester: Secondary | ICD-10-CM

## 2016-04-16 LAB — POCT URINALYSIS DIPSTICK
Blood, UA: NEGATIVE
GLUCOSE UA: NEGATIVE
KETONES UA: NEGATIVE
LEUKOCYTES UA: NEGATIVE
NITRITE UA: NEGATIVE
Protein, UA: NEGATIVE

## 2016-04-16 NOTE — Patient Instructions (Addendum)
Begin taking a 81mg  baby aspirin daily at 12 weeks of pregnancy to decrease risk of preeclampsia during pregnancy   Hydrocortisone cream  Molluscum contagiosum  For Headaches:   Stay well hydrated, drink enough water so that your urine is clear, sometimes if you are dehydrated you can get headaches  Eat small frequent meals and snacks, sometimes if you are hungry you can get headaches  Sometimes you get headaches during pregnancy from the pregnancy hormones  You can try tylenol (1-2 regular strength 325mg  or 1-2 extra strength 500mg ) as directed on the box. The least amount of medication that works is best.   Cool compresses (cool wet washcloth or ice pack) to area of head that is hurting  You can also try drinking a caffeinated drink to see if this will help  If not helping, try below:  For Prevention of Headaches/Migraines:  CoQ10 100mg  three times daily  Vitamin B2 400mg  daily  Magnesium Oxide 400-600mg  daily  If You Get a Bad Headache/Migraine:  Benadryl 25mg    Magnesium Oxide  1 large Gatorade  2 extra strength Tylenol (1,000mg  total)  1 cup coffee or Coke  If this doesn't help please call us @ [phone]    Second Trimester of Pregnancy The second trimester is from week 13 through week 28, months 4 through 6. The second trimester is often a time when you feel your best. Your body has also adjusted to being pregnant, and you begin to feel better physically. Usually, morning sickness has lessened or quit completely, you may have more energy, and you may have an increase in appetite. The second trimester is also a time when the fetus is growing rapidly. At the end of the sixth month, the fetus is about 9 inches long and weighs about 1 pounds. You will likely begin to feel the baby move (quickening) between 18 and 20 weeks of the pregnancy. BODY CHANGES Your body goes through many changes during pregnancy. The changes vary from woman to woman.  4. Your weight  will continue to increase. You will notice your lower abdomen bulging out. 5. You may begin to get stretch marks on your hips, abdomen, and breasts. 6. You may develop headaches that can be relieved by medicines approved by your health care provider. 7. You may urinate more often because the fetus is pressing on your bladder. 8. You may develop or continue to have heartburn as a result of your pregnancy. 9. You may develop constipation because certain hormones are causing the muscles that push waste through your intestines to slow down. 10. You may develop hemorrhoids or swollen, bulging veins (varicose veins). 11. You may have back pain because of the weight gain and pregnancy hormones relaxing your joints between the bones in your pelvis and as a result of a shift in weight and the muscles that support your balance. 12. Your breasts will continue to grow and be tender. 13. Your gums may bleed and may be sensitive to brushing and flossing. 14. Dark spots or blotches (chloasma, mask of pregnancy) may develop on your face. This will likely fade after the baby is born. 15. A dark line from your belly button to the pubic area (linea nigra) may appear. This will likely fade after the baby is born. 16. You may have changes in your hair. These can include thickening of your hair, rapid growth, and changes in texture. Some women also have hair loss during or after pregnancy, or hair that feels dry or thin.  Your hair will most likely return to normal after your baby is born. WHAT TO EXPECT AT YOUR PRENATAL VISITS During a routine prenatal visit: 13. You will be weighed to make sure you and the fetus are growing normally. 14. Your blood pressure will be taken. 15. Your abdomen will be measured to track your baby's growth. 16. The fetal heartbeat will be listened to. 17. Any test results from the previous visit will be discussed. Your health care provider may ask you:  How you are feeling.  If you are  feeling the baby move.  If you have had any abnormal symptoms, such as leaking fluid, bleeding, severe headaches, or abdominal cramping.  If you are using any tobacco products, including cigarettes, chewing tobacco, and electronic cigarettes.  If you have any questions. Other tests that may be performed during your second trimester include:  Blood tests that check for:  Low iron levels (anemia).  Gestational diabetes (between 24 and 28 weeks).  Rh antibodies.  Urine tests to check for infections, diabetes, or protein in the urine.  An ultrasound to confirm the proper growth and development of the baby.  An amniocentesis to check for possible genetic problems.  Fetal screens for spina bifida and Down syndrome.  HIV (human immunodeficiency virus) testing. Routine prenatal testing includes screening for HIV, unless you choose not to have this test. HOME CARE INSTRUCTIONS   Avoid all smoking, herbs, alcohol, and unprescribed drugs. These chemicals affect the formation and growth of the baby.  Do not use any tobacco products, including cigarettes, chewing tobacco, and electronic cigarettes. If you need help quitting, ask your health care provider. You may receive counseling support and other resources to help you quit.  Follow your health care provider's instructions regarding medicine use. There are medicines that are either safe or unsafe to take during pregnancy.  Exercise only as directed by your health care provider. Experiencing uterine cramps is a good sign to stop exercising.  Continue to eat regular, healthy meals.  Wear a good support bra for breast tenderness.  Do not use hot tubs, steam rooms, or saunas.  Wear your seat belt at all times when driving.  Avoid raw meat, uncooked cheese, cat litter boxes, and soil used by cats. These carry germs that can cause birth defects in the baby.  Take your prenatal vitamins.  Take 1500-2000 mg of calcium daily starting at  the 20th week of pregnancy until you deliver your baby.  Try taking a stool softener (if your health care provider approves) if you develop constipation. Eat more high-fiber foods, such as fresh vegetables or fruit and whole grains. Drink plenty of fluids to keep your urine clear or pale yellow.  Take warm sitz baths to soothe any pain or discomfort caused by hemorrhoids. Use hemorrhoid cream if your health care provider approves.  If you develop varicose veins, wear support hose. Elevate your feet for 15 minutes, 3-4 times a day. Limit salt in your diet.  Avoid heavy lifting, wear low heel shoes, and practice good posture.  Rest with your legs elevated if you have leg cramps or low back pain.  Visit your dentist if you have not gone yet during your pregnancy. Use a soft toothbrush to brush your teeth and be gentle when you floss.  A sexual relationship may be continued unless your health care provider directs you otherwise.  Continue to go to all your prenatal visits as directed by your health care provider. SEEK MEDICAL CARE IF:  You have dizziness.  You have mild pelvic cramps, pelvic pressure, or nagging pain in the abdominal area.  You have persistent nausea, vomiting, or diarrhea.  You have a bad smelling vaginal discharge.  You have pain with urination. SEEK IMMEDIATE MEDICAL CARE IF:   You have a fever.  You are leaking fluid from your vagina.  You have spotting or bleeding from your vagina.  You have severe abdominal cramping or pain.  You have rapid weight gain or loss.  You have shortness of breath with chest pain.  You notice sudden or extreme swelling of your face, hands, ankles, feet, or legs.  You have not felt your baby move in over an hour.  You have severe headaches that do not go away with medicine.  You have vision changes.   This information is not intended to replace advice given to you by your health care provider. Make sure you discuss any  questions you have with your health care provider.   Document Released: 09/16/2001 Document Revised: 10/13/2014 Document Reviewed: 11/23/2012 [employer] Interactive Patient Education [employer]

## 2016-04-16 NOTE — Progress Notes (Signed)
Low-risk OB appointment [id number] 8026w2d Estimated Date of Delivery: 10/06/16 BP 112/58 mmHg  Pulse 80  Wt 194 lb (87.998 kg)  LMP 12/31/2015 (Exact Date)  BP, weight, and urine reviewed.  Refer to obstetrical flow sheet for FH & FHR.  No fm yet. Denies cramping, lof, vb, or uti s/s. 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 Bumps around anus- appear to be molloscum contagiosum- do not have condyloma appearance. 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 Daily ha's-apap doesn't help- gave printed prevention/relief measures. Angry all the time, denies current depression/anxiety- discussed fluctuations in hormones- if depression/anxiety returns and feels needs to be on meds let us know. Has not started taking baby asa yet for h/o pp pre-e. Last THC just prior to last visit, none since- will plan to recheck at next visit. Advised continued cessation.  Reviewed warning s/s to report. Plan:  Continue routine obstetrical care  F/U in 4wks for OB appointment and anatomy u/s 2nd IT today

## 2016-04-19 LAB — MATERNAL SCREEN, INTEGRATED #2
AFP MARKER: 50.5 ng/mL
AFP MoM: 1.9
Crown Rump Length: 59.6 mm
DIA MoM: 2.39
DIA Value: 382.5 pg/mL
Estriol, Unconjugated: 0.82 ng/mL
GESTATIONAL AGE: 15.4 wk
Gest. Age on Collection Date: 12.4 weeks
HCG MOM: 1.13
HCG VALUE: 41.4 [IU]/mL
MATERNAL AGE AT EDD: 27.1 a
Nuchal Translucency (NT): 1.4 mm
Nuchal Translucency MoM: 0.92
Number of Fetuses: 1
PAPP-A MOM: 2.93
PAPP-A Value: 2062.6 ng/mL
Test Results:: NEGATIVE
Weight: 189 [lb_av]
Weight: 189 [lb_av]
uE3 MoM: 1.22

## 2016-05-09 ENCOUNTER — Ambulatory Visit (INDEPENDENT_AMBULATORY_CARE_PROVIDER_SITE_OTHER): Payer: Medicaid Other

## 2016-05-09 ENCOUNTER — Encounter: Payer: Self-pay | Admitting: Obstetrics & Gynecology

## 2016-05-09 ENCOUNTER — Encounter: Payer: Medicaid Other | Admitting: Obstetrics & Gynecology

## 2016-05-09 ENCOUNTER — Other Ambulatory Visit: Payer: Medicaid Other

## 2016-05-09 ENCOUNTER — Ambulatory Visit (INDEPENDENT_AMBULATORY_CARE_PROVIDER_SITE_OTHER): Payer: Medicaid Other | Admitting: Obstetrics & Gynecology

## 2016-05-09 VITALS — BP 110/70 | HR 84 | Wt 202.0 lb

## 2016-05-09 DIAGNOSIS — Z3492 Encounter for supervision of normal pregnancy, unspecified, second trimester: Secondary | ICD-10-CM

## 2016-05-09 DIAGNOSIS — Z3482 Encounter for supervision of other normal pregnancy, second trimester: Secondary | ICD-10-CM

## 2016-05-09 DIAGNOSIS — Z1389 Encounter for screening for other disorder: Secondary | ICD-10-CM

## 2016-05-09 DIAGNOSIS — Z36 Encounter for antenatal screening of mother: Secondary | ICD-10-CM | POA: Diagnosis not present

## 2016-05-09 DIAGNOSIS — Z3A19 19 weeks gestation of pregnancy: Secondary | ICD-10-CM

## 2016-05-09 DIAGNOSIS — Z363 Encounter for antenatal screening for malformations: Secondary | ICD-10-CM

## 2016-05-09 DIAGNOSIS — Z331 Pregnant state, incidental: Secondary | ICD-10-CM

## 2016-05-09 NOTE — Progress Notes (Signed)
[id number] [redacted]w[redacted]d Estimated Date of Delivery: 10/06/16  Blood pressure 110/70, pulse 84, weight 202 lb (91.6 kg), last menstrual period 12/31/2015, not currently breastfeeding.   BP weight and urine results all reviewed and noted.  Please refer to the obstetrical flow sheet for the fundal height and fetal heart rate documentation:  Patient reports good fetal movement, denies any bleeding and no rupture of membranes symptoms or regular contractions. Patient is without complaints. All questions were answered.  Orders Placed This Encounter  Procedures  . POCT urinalysis dipstick    Plan:  Continued routine obstetrical care, sonogram is normal see report  Return in about 4 weeks (around 06/06/2016) for LROB.

## 2016-05-09 NOTE — Progress Notes (Signed)
[place] 18+4 wks,measurements c/w dates,cx 4.2 cm,normal ov's bilat,ant fundal pl gr 0, svp of fluid 5.4 cm,fhr 159 bpm,EFW 250 g,anatomy complete,no obvious abnormalities seen

## 2016-05-14 ENCOUNTER — Encounter: Payer: Medicaid Other | Admitting: Obstetrics and Gynecology

## 2016-05-14 ENCOUNTER — Other Ambulatory Visit: Payer: Medicaid Other

## 2016-06-10 ENCOUNTER — Encounter: Payer: Medicaid Other | Admitting: Women's Health

## 2016-06-11 ENCOUNTER — Other Ambulatory Visit: Payer: Self-pay | Admitting: Women's Health

## 2016-06-17 ENCOUNTER — Encounter: Payer: Medicaid Other | Admitting: Women's Health

## 2016-07-02 ENCOUNTER — Encounter: Payer: Self-pay | Admitting: Women's Health

## 2016-07-02 ENCOUNTER — Ambulatory Visit (INDEPENDENT_AMBULATORY_CARE_PROVIDER_SITE_OTHER): Payer: Medicaid Other | Admitting: Women's Health

## 2016-07-02 VITALS — BP 102/64 | HR 76 | Wt 211.0 lb

## 2016-07-02 DIAGNOSIS — Z23 Encounter for immunization: Secondary | ICD-10-CM | POA: Diagnosis not present

## 2016-07-02 DIAGNOSIS — Z331 Pregnant state, incidental: Secondary | ICD-10-CM

## 2016-07-02 DIAGNOSIS — Z1389 Encounter for screening for other disorder: Secondary | ICD-10-CM

## 2016-07-02 DIAGNOSIS — Z3492 Encounter for supervision of normal pregnancy, unspecified, second trimester: Secondary | ICD-10-CM

## 2016-07-02 LAB — POCT URINALYSIS DIPSTICK
Blood, UA: NEGATIVE
Glucose, UA: NEGATIVE
KETONES UA: NEGATIVE
Leukocytes, UA: NEGATIVE
Nitrite, UA: NEGATIVE
PROTEIN UA: NEGATIVE

## 2016-07-02 MED ORDER — ESCITALOPRAM OXALATE 10 MG PO TABS: 10.0000 mg | ORAL_TABLET | Freq: Every day | ORAL | 6 refills | Status: DC

## 2016-07-02 MED ORDER — ALBUTEROL SULFATE HFA 108 (90 BASE) MCG/ACT IN AERS: 1.0000 | INHALATION_SPRAY | RESPIRATORY_TRACT | 0 refills | Status: DC | PRN

## 2016-07-02 NOTE — Progress Notes (Signed)
Low-risk OB appointment [id number] 7234w2d Estimated Date of Delivery: 10/06/16 BP 102/64   Pulse 76   Wt 211 lb (95.7 kg)   LMP 12/31/2015 (Exact Date)   BMI 36.79 kg/m   BP, weight, and urine reviewed.  Refer to obstetrical flow sheet for FH & FHR.  Reports good fm.  Denies regular uc's, lof, vb, or uti s/s. Feeling depressed, was on meds in past, feels like she needs to restart. Denies SI/HI. Rx Lexapro 10mg  daily, knows can take few weeks for improvement. Needs refill on inhaler- sent today. Got engaged at gender reveal! 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  No care since 18wks, missed appt Reviewed ptl s/s, fkc. Plan:  Continue routine obstetrical care  F/U in 2wks for OB appointment, pn2 w/ HSV2 IgG Ab

## 2016-07-02 NOTE — Patient Instructions (Addendum)
You will have your sugar test next visit.  Please do not eat or drink anything after midnight the night before you come, not even water.  You will be here for at least two hours.     For Dizzy Spells:   This is usually related to either your blood sugar or your blood pressure dropping  Make sure you are staying well hydrated and drinking enough water so that your urine is clear  Eat small frequent meals and snacks containing protein (meat, eggs, nuts, cheese) so that your blood sugar doesn't drop  If you do get dizzy, sit/lay down and get you something to drink and a snack containing protein- you will usually start feeling better in 10-20 minutes     Call the office [phone]) or go to [facility] if:  You begin to have strong, frequent contractions  Your water breaks.  Sometimes it is a big gush of fluid, sometimes it is just a trickle that keeps getting your panties wet or running down your legs  You have vaginal bleeding.  It is normal to have a small amount of spotting if your cervix was checked.   You don't feel your baby moving like normal.  If you don't, get you something to eat and drink and lay down and focus on feeling your baby move.   If your baby is still not moving like normal, you should call the office or [facility] to [facility].  Second Trimester of Pregnancy The second trimester is from week 13 through week 28, months 4 through 6. The second trimester is often a time when you feel your best. Your body has also adjusted to being pregnant, and you begin to feel better physically. Usually, morning sickness has lessened or quit completely, you may have more energy, and you may have an increase in appetite. The second trimester is also a time when the fetus is growing rapidly. At the end of the sixth month, the fetus is about 9 inches long and weighs about 1 pounds. You will likely begin to feel the baby move (quickening) between 18 and 20 weeks of the pregnancy. BODY  CHANGES Your body goes through many changes during pregnancy. The changes vary from woman to woman.   Your weight will continue to increase. You will notice your lower abdomen bulging out.  You may begin to get stretch marks on your hips, abdomen, and breasts.  You may develop headaches that can be relieved by medicines approved by your health care provider.  You may urinate more often because the fetus is pressing on your bladder.  You may develop or continue to have heartburn as a result of your pregnancy.  You may develop constipation because certain hormones are causing the muscles that push waste through your intestines to slow down.  You may develop hemorrhoids or swollen, bulging veins (varicose veins).  You may have back pain because of the weight gain and pregnancy hormones relaxing your joints between the bones in your pelvis and as a result of a shift in weight and the muscles that support your balance.  Your breasts will continue to grow and be tender.  Your gums may bleed and may be sensitive to brushing and flossing.  Dark spots or blotches (chloasma, mask of pregnancy) may develop on your face. This will likely fade after the baby is born.  A dark line from your belly button to the pubic area (linea nigra) may appear. This will likely fade after the  baby is born.  You may have changes in your hair. These can include thickening of your hair, rapid growth, and changes in texture. Some women also have hair loss during or after pregnancy, or hair that feels dry or thin. Your hair will most likely return to normal after your baby is born. WHAT TO EXPECT AT YOUR PRENATAL VISITS During a routine prenatal visit:  You will be weighed to make sure you and the fetus are growing normally.  Your blood pressure will be taken.  Your abdomen will be measured to track your baby's growth.  The fetal heartbeat will be listened to.  Any test results from the previous visit will be  discussed. Your health care provider may ask you:  How you are feeling.  If you are feeling the baby move.  If you have had any abnormal symptoms, such as leaking fluid, bleeding, severe headaches, or abdominal cramping.  If you have any questions. Other tests that may be performed during your second trimester include:  Blood tests that check for:  Low iron levels (anemia).  Gestational diabetes (between 24 and 28 weeks).  Rh antibodies.  Urine tests to check for infections, diabetes, or protein in the urine.  An ultrasound to confirm the proper growth and development of the baby.  An amniocentesis to check for possible genetic problems.  Fetal screens for spina bifida and Down syndrome. HOME CARE INSTRUCTIONS   Avoid all smoking, herbs, alcohol, and unprescribed drugs. These chemicals affect the formation and growth of the baby.  Follow your health care provider's instructions regarding medicine use. There are medicines that are either safe or unsafe to take during pregnancy.  Exercise only as directed by your health care provider. Experiencing uterine cramps is a good sign to stop exercising.  Continue to eat regular, healthy meals.  Wear a good support bra for breast tenderness.  Do not use hot tubs, steam rooms, or saunas.  Wear your seat belt at all times when driving.  Avoid raw meat, uncooked cheese, cat litter boxes, and soil used by cats. These carry germs that can cause birth defects in the baby.  Take your prenatal vitamins.  Try taking a stool softener (if your health care provider approves) if you develop constipation. Eat more high-fiber foods, such as fresh vegetables or fruit and whole grains. Drink plenty of fluids to keep your urine clear or pale yellow.  Take warm sitz baths to soothe any pain or discomfort caused by hemorrhoids. Use hemorrhoid cream if your health care provider approves.  If you develop varicose veins, wear support hose. Elevate  your feet for 15 minutes, 3-4 times a day. Limit salt in your diet.  Avoid heavy lifting, wear low heel shoes, and practice good posture.  Rest with your legs elevated if you have leg cramps or low back pain.  Visit your dentist if you have not gone yet during your pregnancy. Use a soft toothbrush to brush your teeth and be gentle when you floss.  A sexual relationship may be continued unless your health care provider directs you otherwise.  Continue to go to all your prenatal visits as directed by your health care provider. SEEK MEDICAL CARE IF:   You have dizziness.  You have mild pelvic cramps, pelvic pressure, or nagging pain in the abdominal area.  You have persistent nausea, vomiting, or diarrhea.  You have a bad smelling vaginal discharge.  You have pain with urination. SEEK IMMEDIATE MEDICAL CARE IF:   You  have a fever.  You are leaking fluid from your vagina.  You have spotting or bleeding from your vagina.  You have severe abdominal cramping or pain.  You have rapid weight gain or loss.  You have shortness of breath with chest pain.  You notice sudden or extreme swelling of your face, hands, ankles, feet, or legs.  You have not felt your baby move in over an hour.  You have severe headaches that do not go away with medicine.  You have vision changes. Document Released: 09/16/2001 Document Revised: 09/27/2013 Document Reviewed: 11/23/2012 [facility] Patient Information 2015 [place], [place]. This information is not intended to replace advice given to you by your health care provider. Make sure you discuss any questions you have with your health care provider.

## 2016-07-18 ENCOUNTER — Ambulatory Visit (INDEPENDENT_AMBULATORY_CARE_PROVIDER_SITE_OTHER): Payer: Medicaid Other | Admitting: Women's Health

## 2016-07-18 ENCOUNTER — Other Ambulatory Visit: Payer: Medicaid Other

## 2016-07-18 ENCOUNTER — Encounter: Payer: Self-pay | Admitting: Women's Health

## 2016-07-18 VITALS — BP 114/62 | HR 80 | Wt 212.0 lb

## 2016-07-18 DIAGNOSIS — O09299 Supervision of pregnancy with other poor reproductive or obstetric history, unspecified trimester: Secondary | ICD-10-CM

## 2016-07-18 DIAGNOSIS — Z1389 Encounter for screening for other disorder: Secondary | ICD-10-CM

## 2016-07-18 DIAGNOSIS — Z131 Encounter for screening for diabetes mellitus: Secondary | ICD-10-CM

## 2016-07-18 DIAGNOSIS — Z331 Pregnant state, incidental: Secondary | ICD-10-CM

## 2016-07-18 DIAGNOSIS — Z3483 Encounter for supervision of other normal pregnancy, third trimester: Secondary | ICD-10-CM

## 2016-07-18 LAB — POCT URINALYSIS DIPSTICK
Glucose, UA: NEGATIVE
Ketones, UA: NEGATIVE
Leukocytes, UA: NEGATIVE
Nitrite, UA: NEGATIVE
PROTEIN UA: NEGATIVE
RBC UA: NEGATIVE

## 2016-07-18 NOTE — Progress Notes (Signed)
Low-risk OB appointment [id number] 7543w4d Estimated Date of Delivery: 10/06/16 BP 114/62   Pulse 80   Wt 212 lb (96.2 kg)   LMP 12/31/2015 (Exact Date)   BMI 36.97 kg/m   BP, weight, and urine reviewed.  Refer to obstetrical flow sheet for FH & FHR.  Reports good fm.  Denies regular uc's, lof, vb, or uti s/s. No complaints. Reviewed ptl s/s, fkc. Recommended Tdap at HD/PCP per CDC guidelines.  Plan:  Continue routine obstetrical care  F/U in 4wks for OB appointment  PN2 today

## 2016-07-18 NOTE — Patient Instructions (Signed)

## 2016-07-19 LAB — CBC
Hematocrit: 31.7 % — ABNORMAL LOW (ref 34.0–46.6)
Hemoglobin: 10.7 g/dL — ABNORMAL LOW (ref 11.1–15.9)
MCH: 29.8 pg (ref 26.6–33.0)
MCHC: 33.8 g/dL (ref 31.5–35.7)
MCV: 88 fL (ref 79–97)
PLATELETS: 264 10*3/uL (ref 150–379)
RBC: 3.59 x10E6/uL — ABNORMAL LOW (ref 3.77–5.28)
RDW: 13.1 % (ref 12.3–15.4)
WBC: 6.5 10*3/uL (ref 3.4–10.8)

## 2016-07-19 LAB — RPR: RPR: NONREACTIVE

## 2016-07-19 LAB — GLUCOSE TOLERANCE, 2 HOURS W/ 1HR
GLUCOSE, FASTING: 78 mg/dL (ref 65–91)
Glucose, 1 hour: 118 mg/dL (ref 65–179)
Glucose, 2 hour: 121 mg/dL (ref 65–152)

## 2016-07-19 LAB — HSV 2 ANTIBODY, IGG

## 2016-07-19 LAB — HIV ANTIBODY (ROUTINE TESTING W REFLEX): HIV Screen 4th Generation wRfx: NONREACTIVE

## 2016-07-19 LAB — ANTIBODY SCREEN: Antibody Screen: NEGATIVE

## 2016-07-21 ENCOUNTER — Other Ambulatory Visit: Payer: Self-pay | Admitting: Women's Health

## 2016-07-21 MED ORDER — FERROUS SULFATE 325 (65 FE) MG PO TABS: 325.0000 mg | ORAL_TABLET | Freq: Two times a day (BID) | ORAL | 3 refills | Status: DC

## 2016-07-24 ENCOUNTER — Encounter (HOSPITAL_COMMUNITY): Payer: Self-pay | Admitting: Emergency Medicine

## 2016-07-24 ENCOUNTER — Emergency Department (HOSPITAL_COMMUNITY)
Admission: EM | Admit: 2016-07-24 | Discharge: 2016-07-24 | Disposition: A | Discharge status: Home / Self Care | Payer: Medicaid Other | Attending: Emergency Medicine | Admitting: Emergency Medicine

## 2016-07-24 DIAGNOSIS — R0789 Other chest pain: Secondary | ICD-10-CM | POA: Diagnosis not present

## 2016-07-24 DIAGNOSIS — Z87891 Personal history of nicotine dependence: Secondary | ICD-10-CM | POA: Insufficient documentation

## 2016-07-24 DIAGNOSIS — R51 Headache: Secondary | ICD-10-CM | POA: Diagnosis not present

## 2016-07-24 DIAGNOSIS — J45909 Unspecified asthma, uncomplicated: Secondary | ICD-10-CM | POA: Diagnosis not present

## 2016-07-24 DIAGNOSIS — R519 Headache, unspecified: Secondary | ICD-10-CM

## 2016-07-24 DIAGNOSIS — Z79899 Other long term (current) drug therapy: Secondary | ICD-10-CM | POA: Diagnosis not present

## 2016-07-24 DIAGNOSIS — R0602 Shortness of breath: Secondary | ICD-10-CM | POA: Diagnosis present

## 2016-07-24 LAB — URINALYSIS, ROUTINE W REFLEX MICROSCOPIC
BILIRUBIN URINE: NEGATIVE
Glucose, UA: 250 mg/dL — AB
Hgb urine dipstick: NEGATIVE
LEUKOCYTES UA: NEGATIVE
NITRITE: NEGATIVE
PH: 6 (ref 5.0–8.0)
Protein, ur: NEGATIVE mg/dL
SPECIFIC GRAVITY, URINE: 1.025 (ref 1.005–1.030)

## 2016-07-24 MED ORDER — ACETAMINOPHEN 325 MG PO TABS
650.0000 mg | ORAL_TABLET | Freq: Once | ORAL | Status: AC
  Administered 2016-07-24: 650 mg via ORAL
  Filled 2016-07-24: qty 2

## 2016-07-24 NOTE — ED Provider Notes (Signed)
[facility]-EMER[facility]ENCY DEPT Provider Note   CSN: [id number] Arrival date & time: 07/24/16  2108     History   Chief Complaint Chief Complaint  Patient presents with  . Shortness of Breath    HPI Monica Cox is a 25 y.o. female.  HPI  Monica Cox is a 25 y.o. female G38P2Ab0 who is [redacted] weeks pregnant,  presents to the Emergency Department complaining of frontal headache and substernal chest pain with deep breathing and not associated with shortness of breath.  Symptoms began earlier today, gradual in onset.  She describes a dull pain to the middle upper chest that's only associated with breathing and described as "mild". Headache is described as throbbing.  She reports regular prenatal care and no obstetrical complications thus far.  Good fetal movement reported.  She denies cough, abdominal pain, vaginal bleeding, fever, visual changes, shortness of breath, recent travel or hx of PE.   Past Medical History:  Diagnosis Date  . Anemia   . Asthma   . Nausea 02/06/2016  . Pregnant 02/06/2016  . Reflux   . Smoker 02/06/2016  . Trichomonas infection 08-08-2014  . Vaginal Pap smear, abnormal     Patient Active Problem List   Diagnosis Date Noted  . Marijuana use 03/24/2016  . Depression with anxiety 03/19/2016  . Supervision of normal pregnancy 03/19/2016  . History of chlamydia 03/19/2016  . Hx of preeclampsia, prior pregnancy, currently pregnant 03/19/2016  . Smoker 02/06/2016  . Nausea 02/06/2016  . Vertigo 09/21/2014  . Heart murmur 09/21/2014  . H/O trichomonas 08/08/2014  . Rh negative state in antepartum period 05/01/2014  . Hematemesis 02/20/2011  . Epigastric pain 02/20/2011    Past Surgical History:  Procedure Laterality Date  . DILATION AND CURETTAGE OF UTERUS     MAB Dr. Despina Hidden performed  . ESOPHAGOGASTRODUODENOSCOPY ENDOSCOPY      OB History    Gravida Para Term Preterm AB Living   4 2 2  0 1 2   SAB TAB Ectopic Multiple Live Births   1 0 0 0 2        Home Medications    Prior to Admission medications   Medication Sig Start Date End Date Taking? Authorizing Provider  albuterol (PROVENTIL HFA;VENTOLIN HFA) 108 (90 Base) MCG/ACT inhaler Inhale 1-2 puffs into the lungs every 4 (four) hours as needed for wheezing or shortness of breath. 07/02/16   Cheral Marker, CNM  BABY ASPIRIN PO Take 81 mg by mouth daily.    Historical Provider, MD  diphenhydramine-acetaminophen (TYLENOL PM) 25-500 MG TABS tablet Take 1 tablet by mouth at bedtime as needed.    Historical Provider, MD  escitalopram (LEXAPRO) 10 MG tablet Take 1 tablet (10 mg total) by mouth daily. 07/02/16   Cheral Marker, CNM  ferrous sulfate 325 (65 FE) MG tablet Take 1 tablet (325 mg total) by mouth 2 (two) times daily with a meal. 07/21/16   Cheral Marker, CNM  Prenat-FeCbn-FeAspGl-FA-Omega (OB COMPLETE PETITE) 35-5-1-200 MG CAPS Take 1 daily Patient taking differently: Take 1 tablet by mouth daily.  02/06/16   Adline Potter, NP  promethazine (PHENERGAN) 25 MG tablet TAKE 0.5-1 TABLETS (12.5-25 MG TOTAL) BY MOUTH EVERY 6 (SIX) HOURS AS NEEDED FOR NAUSEA OR VOMITING. 06/16/16   Cheral Marker, CNM    Family History Family History  Problem Relation Age of Onset  . Ulcers Father   . Hypertension Father   . Hypertension Mother   . Ulcers Paternal  Grandmother   . Diabetes Paternal Grandmother   . Ulcers Paternal Aunt   . Liver disease Neg Hx   . Colon cancer Neg Hx     Social History Social History  Substance Use Topics  . Smoking status: Former Smoker    Years: 3.00    Types: Cigars  . Smokeless tobacco: Never Used  . Alcohol use No     Comment: occasionally; not now     Allergies   Review of patient's allergies indicates no known allergies.   Review of Systems Review of Systems  Constitutional: Negative for activity change, appetite change and fever.  HENT: Negative for congestion and sore throat.   Respiratory: Negative for chest tightness and  shortness of breath.   Cardiovascular: Positive for chest pain.  Gastrointestinal: Negative for abdominal pain and vomiting.  Genitourinary: Negative for difficulty urinating, dysuria and vaginal bleeding.  Musculoskeletal: Negative for neck pain.  Skin: Negative for rash.  Neurological: Positive for headaches. Negative for dizziness, weakness and numbness.  Psychiatric/Behavioral: Negative for confusion.     Physical Exam Updated Vital Signs BP 119/66 (BP Location: Left Arm)   Pulse 99   Temp 98.5 F (36.9 C) (Oral)   Resp 18   Ht 5\' 3"  (1.6 m)   Wt 96.2 kg   LMP 12/31/2015 (Exact Date)   SpO2 99%   BMI 37.55 kg/m   Physical Exam  Constitutional: She is oriented to person, place, and time. She appears well-developed and well-nourished. No distress.  HENT:  Head: Normocephalic.  Mouth/Throat: Oropharynx is clear and moist.  Eyes: EOM are normal. Pupils are equal, round, and reactive to light.  Neck: Normal range of motion.  Cardiovascular: Normal rate, regular rhythm, normal heart sounds and intact distal pulses.   Pulmonary/Chest: Effort normal and breath sounds normal. No respiratory distress. She has no wheezes. She exhibits no tenderness.  Abdominal: Soft. There is no tenderness.   gravid  Musculoskeletal: Normal range of motion. She exhibits no edema.  Neurological: She is alert and oriented to person, place, and time.  Skin: Skin is warm and dry. No rash noted.  Psychiatric: She has a normal mood and affect.  Nursing note and vitals reviewed.    ED Treatments / Results  Labs (all labs ordered are listed, but only abnormal results are displayed) Labs Reviewed  URINALYSIS, ROUTINE W REFLEX MICROSCOPIC (NOT AT [facility]) - Abnormal; Notable for the following:       Result Value   Glucose, UA 250 (*)    Ketones, ur TRACE (*)    All other components within normal limits    EKG  EKG Interpretation  Date/Time:  Thursday July 24 2016 21:51:19 EDT Ventricular  Rate:  93 PR Interval:    QRS Duration: 76 QT Interval:  350 QTC Calculation: 436 R Axis:   -10 Text Interpretation:  Sinus rhythm LVH by voltage Baseline wander in lead(s) V3 V4 V5 V6 Confirmed by Shantika Bermea  MD, Jatavion Peaster ([id number]) on 07/24/2016 11:11:42 PM       Radiology No results found.  Procedures Procedures (including critical care time)  Medications Ordered in ED Medications - No data to display   Initial Impression / Assessment and Plan / ED Course  I have reviewed the triage vital signs and the nursing notes.  Pertinent labs & imaging results that were available during my care of the patient were reviewed by me and considered in my medical decision making (see chart for details).  Clinical Course  Pt well appearing.  Vitals stable.  No respiratory distress noted.    2145  Pt also evaluated by Dr. Adriana Simas and care plan discussed.  No tachycardia, hypoxia or tachypnea.  Doubt PE or cardiac process.   FHT's 145  On recheck pt is resting comfortably, no distress noted.  She appears stable for d/c and agrees to f/u with OB tomorrow.  ER return precautions given and pt agrees to plan.    Final Clinical Impressions(s) / ED Diagnoses   Final diagnoses:  Chest wall pain  Nonintractable episodic headache, unspecified headache type    New Prescriptions New Prescriptions   No medications on file     Rosey Bath 07/24/16 2353 Medical screening examination/treatment/procedure(s) were conducted as a shared visit with non-physician practitioner(s) and myself.  I personally evaluated the patient during the encounter.   EKG Interpretation  Date/Time:  Thursday July 24 2016 21:51:19 EDT Ventricular Rate:  93 PR Interval:    QRS Duration: 76 QT Interval:  350 QTC Calculation: 436 R Axis:   -10 Text Interpretation:  Sinus rhythm LVH by voltage Baseline wander in lead(s) V3 V4 V5 V6 Confirmed by Sheriden Archibeque  MD, Breydan Shillingburg ([id number]) on 07/24/2016 11:11:42 PM     G4 P2 Ab0 [redacted]  weeks pregnant presents with dyspnea.  Her physical exam is normal. No clinical evidence of ACS or pulmonary embolism.   Donnetta Hutching, MD 07/29/16 [phone]

## 2016-07-24 NOTE — Discharge Instructions (Signed)
Call your OB tomorrow to arrange follow-up appt.  Return to ER for any worsening symptoms

## 2016-07-24 NOTE — ED Triage Notes (Addendum)
Pt c/o pain when she breathes and headache since last night. Pt is [redacted] weeks pregnant.

## 2016-07-30 ENCOUNTER — Other Ambulatory Visit: Payer: Self-pay | Admitting: Women's Health

## 2016-08-15 ENCOUNTER — Encounter: Payer: Medicaid Other | Admitting: Women's Health

## 2016-09-03 ENCOUNTER — Encounter: Payer: Self-pay | Admitting: Obstetrics & Gynecology

## 2016-09-11 ENCOUNTER — Other Ambulatory Visit: Payer: Self-pay | Admitting: Women's Health

## 2016-09-11 ENCOUNTER — Telehealth: Payer: Self-pay | Admitting: Women's Health

## 2016-09-11 MED ORDER — PROMETHAZINE HCL 25 MG PO TABS: ORAL_TABLET | ORAL | 0 refills | Status: DC

## 2016-09-11 NOTE — Telephone Encounter (Signed)
Informed patient of prescription sent to pharmacy. 

## 2016-09-11 NOTE — Telephone Encounter (Signed)
Pt called stating that she needs refill of her medication. Pt has an appointment with kim on the 13th and she states that normally kim waits for the day of her appointment but she only has two pills left and would like a refill before her appointment. Please contact pt

## 2016-09-11 NOTE — Telephone Encounter (Signed)
Patient has appointment scheduled for 12/13 but only has 3 pills left of her phenergan and would like a refill. Please advise.

## 2016-09-17 ENCOUNTER — Ambulatory Visit (INDEPENDENT_AMBULATORY_CARE_PROVIDER_SITE_OTHER): Payer: PRIVATE HEALTH INSURANCE | Admitting: Women's Health

## 2016-09-17 ENCOUNTER — Encounter: Payer: Self-pay | Admitting: Women's Health

## 2016-09-17 VITALS — BP 112/64 | HR 80 | Wt 224.0 lb

## 2016-09-17 DIAGNOSIS — O360131 Maternal care for anti-D [Rh] antibodies, third trimester, fetus 1: Secondary | ICD-10-CM

## 2016-09-17 DIAGNOSIS — Z331 Pregnant state, incidental: Secondary | ICD-10-CM

## 2016-09-17 DIAGNOSIS — Z1389 Encounter for screening for other disorder: Secondary | ICD-10-CM | POA: Diagnosis not present

## 2016-09-17 DIAGNOSIS — O09293 Supervision of pregnancy with other poor reproductive or obstetric history, third trimester: Secondary | ICD-10-CM

## 2016-09-17 DIAGNOSIS — Z6791 Unspecified blood type, Rh negative: Secondary | ICD-10-CM

## 2016-09-17 DIAGNOSIS — Z3483 Encounter for supervision of other normal pregnancy, third trimester: Secondary | ICD-10-CM

## 2016-09-17 DIAGNOSIS — Z3A38 38 weeks gestation of pregnancy: Secondary | ICD-10-CM

## 2016-09-17 LAB — OB RESULTS CONSOLE GBS: STREP GROUP B AG: NEGATIVE

## 2016-09-17 MED ORDER — RHO D IMMUNE GLOBULIN 1500 UNIT/2ML IJ SOSY
300.0000 ug | PREFILLED_SYRINGE | Freq: Once | INTRAMUSCULAR | Status: AC
  Administered 2016-09-17: 300 ug via INTRAMUSCULAR

## 2016-09-17 MED ORDER — LIDOCAINE 5 % EX OINT: 1.0000 "application " | TOPICAL_OINTMENT | Freq: Once | CUTANEOUS | 0 refills | Status: AC

## 2016-09-17 NOTE — Patient Instructions (Signed)
Call the office ([phone]) or go to [facility] if:  You begin to have strong, frequent contractions  Your water breaks.  Sometimes it is a big gush of fluid, sometimes it is just a trickle that keeps getting your panties wet or running down your legs  You have vaginal bleeding.  It is normal to have a small amount of spotting if your cervix was checked.   You don't feel your baby moving like normal.  If you don't, get you something to eat and drink and lay down and focus on feeling your baby move.  You should feel at least 10 movements in 2 hours.  If you don't, you should call the office or go to [facility].     Braxton Hicks Contractions Contractions of the uterus can occur throughout pregnancy. Contractions are not always a sign that you are in labor.  WHAT ARE BRAXTON HICKS CONTRACTIONS?  Contractions that occur before labor are called Braxton Hicks contractions, or false labor. Toward the end of pregnancy (32-34 weeks), these contractions can develop more often and may become more forceful. This is not true labor because these contractions do not result in opening (dilatation) and thinning of the cervix. They are sometimes difficult to tell apart from true labor because these contractions can be forceful and people have different pain tolerances. You should not feel embarrassed if you go to the hospital with false labor. Sometimes, the only way to tell if you are in true labor is for your health care provider to look for changes in the cervix. If there are no prenatal problems or other health problems associated with the pregnancy, it is completely safe to be sent home with false labor and await the onset of true labor. HOW CAN YOU TELL THE DIFFERENCE BETWEEN TRUE AND FALSE LABOR? False Labor   The contractions of false labor are usually shorter and not as hard as those of true labor.   The contractions are usually irregular.   The contractions are often felt in the front of  the lower abdomen and in the groin.   The contractions may go away when you walk around or change positions while lying down.   The contractions get weaker and are shorter lasting as time goes on.   The contractions do not usually become progressively stronger, regular, and closer together as with true labor.  True Labor   Contractions in true labor last 30-70 seconds, become very regular, usually become more intense, and increase in frequency.   The contractions do not go away with walking.   The discomfort is usually felt in the top of the uterus and spreads to the lower abdomen and low back.   True labor can be determined by your health care provider with an exam. This will show that the cervix is dilating and getting thinner.  WHAT TO REMEMBER  Keep up with your usual exercises and follow other instructions given by your health care provider.   Take medicines as directed by your health care provider.   Keep your regular prenatal appointments.   Eat and drink lightly if you think you are going into labor.   If Braxton Hicks contractions are making you uncomfortable:   Change your position from lying down or resting to walking, or from walking to resting.   Sit and rest in a tub of warm water.   Drink 2-3 glasses of water. Dehydration may cause these contractions.   Do slow and deep breathing several   times an hour.  WHEN SHOULD I SEEK IMMEDIATE MEDICAL CARE? Seek immediate medical care if:  Your contractions become stronger, more regular, and closer together.   You have fluid leaking or gushing from your vagina.   You have a fever.   You pass blood-tinged mucus.   You have vaginal bleeding.   You have continuous abdominal pain.   You have low back pain that you never had before.   You feel your baby's head pushing down and causing pelvic pressure.   Your baby is not moving as much as it used to.  This information is not intended to  replace advice given to you by your health care provider. Make sure you discuss any questions you have with your health care provider. Document Released: 09/22/2005 Document Revised: 01/14/2016 Document Reviewed: 07/04/2013 [employer] Interactive Patient Education  2017 [employer]  

## 2016-09-17 NOTE — Progress Notes (Signed)
Low-risk OB appointment [id number] 327w2d Estimated Date of Delivery: 10/06/16 BP 112/64   Pulse 80   Wt 224 lb (101.6 kg)   LMP 12/31/2015 (Exact Date)   BMI 39.68 kg/m   BP, weight, and urine reviewed.  Refer to obstetrical flow sheet for FH & FHR.  Reports good fm.  Denies regular uc's, lof, vb, or uti s/s. Bumps on bottom getting worse.  Multiple molloscum contagiosum.  GBS, gc/ct collected SVE: 1.5/th/-2, vtx Co-exam w/ JVF, can have removed next visit Reviewed labor s/s, fkc. Plan:  Continue routine obstetrical care  F/U in 1wk for OB appointment

## 2016-09-19 LAB — GC/CHLAMYDIA PROBE AMP
Chlamydia trachomatis, NAA: NEGATIVE
Neisseria gonorrhoeae by PCR: NEGATIVE

## 2016-09-19 LAB — STREP GP B NAA: Strep Gp B NAA: NEGATIVE

## 2016-09-26 ENCOUNTER — Encounter: Payer: Self-pay | Admitting: Obstetrics and Gynecology

## 2016-09-26 ENCOUNTER — Ambulatory Visit (INDEPENDENT_AMBULATORY_CARE_PROVIDER_SITE_OTHER): Payer: PRIVATE HEALTH INSURANCE | Admitting: Obstetrics and Gynecology

## 2016-09-26 VITALS — BP 134/72 | HR 84 | Wt 227.0 lb

## 2016-09-26 DIAGNOSIS — Z3A39 39 weeks gestation of pregnancy: Secondary | ICD-10-CM

## 2016-09-26 DIAGNOSIS — O360131 Maternal care for anti-D [Rh] antibodies, third trimester, fetus 1: Secondary | ICD-10-CM

## 2016-09-26 DIAGNOSIS — Z1389 Encounter for screening for other disorder: Secondary | ICD-10-CM | POA: Diagnosis not present

## 2016-09-26 DIAGNOSIS — Z3483 Encounter for supervision of other normal pregnancy, third trimester: Secondary | ICD-10-CM | POA: Diagnosis not present

## 2016-09-26 DIAGNOSIS — Z331 Pregnant state, incidental: Secondary | ICD-10-CM

## 2016-09-26 DIAGNOSIS — Z3403 Encounter for supervision of normal first pregnancy, third trimester: Secondary | ICD-10-CM

## 2016-09-26 LAB — POCT URINALYSIS DIPSTICK
Blood, UA: NEGATIVE
Blood, UA: NEGATIVE
GLUCOSE UA: NEGATIVE
Glucose, UA: NEGATIVE
KETONES UA: NEGATIVE
Ketones, UA: NEGATIVE
LEUKOCYTES UA: NEGATIVE
NITRITE UA: NEGATIVE
Nitrite, UA: NEGATIVE
PROTEIN UA: NEGATIVE

## 2016-09-26 NOTE — Progress Notes (Signed)
Monica PeckBriana N Pickard is a 25 y.o. female  [phone]  Estimated Date of Delivery: 10/06/16 LROB 4875w4d  Blood pressure 134/72, pulse 84, weight 227 lb (103 kg), last menstrual period 12/31/2015, not currently breastfeeding.   Urine results: notable for trace leukocytes.   Chief Complaint  Patient presents with  . Routine Prenatal Visit    increase swelling    Patient reports good fetal movement, denies any bleeding , rupture of membranes,or regular contractions. She complains of increased swelling and rash to external genitalia.  refer to the ob flow sheet for FH and FHR,                        Physical Examination: General appearance - alert, well appearing, and in no distress and oriented to person, place, and time                                      Abdomen - FH 38 cm ,                                                         -FHR 135 bpm                                                         soft, nontender,                                       Pelvic - CERVIX: closed; soft                                           Procedure Note:  Multiple vulvar skin tags consistent with molluscum contagious to be removed.  Local infiltrate: 25 gauge needle used to administer 4 cc 1% lidocane 9 tags removed. Bleeding controlled with silver nitrate. Pt tolerated procedure well.    Questions were answered. Assessment: LROB [id number] @ 7875w4d   Plan:  Continued routine obstetrical care  F/u in 1 week for routine care  By signing my name below, I, Monica Cox, attest that this documentation has been prepared under the direction and in the presence of Tilda BurrowJohn V Colburn Asper, MD . Electronically Signed: Freida Busmaniana Cox, Scribe. 09/26/2016. 12:21 PM. I personally performed the services described in this documentation, which was SCRIBED in my presence. The recorded information has been reviewed and considered accurate. It has been edited as necessary during review. Tilda BurrowFERGUSON,Aryan Bello V, MD

## 2016-09-27 ENCOUNTER — Inpatient Hospital Stay (HOSPITAL_COMMUNITY)
Admission: AD | Admit: 2016-09-27 | Discharge: 2016-09-27 | Disposition: A | Discharge status: Home / Self Care | Payer: Medicaid Other | Source: Ambulatory Visit | Attending: Obstetrics and Gynecology | Admitting: Obstetrics and Gynecology

## 2016-09-27 ENCOUNTER — Encounter (HOSPITAL_COMMUNITY): Payer: Self-pay | Admitting: *Deleted

## 2016-09-27 DIAGNOSIS — N898 Other specified noninflammatory disorders of vagina: Secondary | ICD-10-CM | POA: Diagnosis present

## 2016-09-27 DIAGNOSIS — Z3A38 38 weeks gestation of pregnancy: Secondary | ICD-10-CM | POA: Diagnosis not present

## 2016-09-27 DIAGNOSIS — O479 False labor, unspecified: Secondary | ICD-10-CM

## 2016-09-27 DIAGNOSIS — O26893 Other specified pregnancy related conditions, third trimester: Secondary | ICD-10-CM | POA: Diagnosis not present

## 2016-09-27 DIAGNOSIS — O471 False labor at or after 37 completed weeks of gestation: Secondary | ICD-10-CM

## 2016-09-27 NOTE — MAU Provider Note (Signed)
S:  [name] is a 25 y.o. female [phone] @ 106w5d here with possible ROM. The patient had an unusually long urine stream this morning and then urinated again shortly after. She was concerned that she was leaking fluid and not just urinating.    O:  GENERAL: Well-developed, well-nourished female in no acute distress.  LUNGS: Effort normal SKIN: Warm, dry and without erythema PSYCH: Normal mood and affect  Vitals:   09/27/16 0930  BP: 117/74  Pulse: 95  Resp: 16  Temp: 97.8 F (36.6 C)   MDM:  Vagina - Moderate amount of white vaginal discharge, no odor. No pooling of fluid in the vagina.  Cervix - No contact bleeding, no active bleeding  Dilation: 1.5 Effacement (%): 50 Cervical Position: Posterior Station: -3 Presentation: Vertex Exam by:: Exie ParodyA. Gagliardo, RN Chaperone present for exam.    A:  1. Vaginal discharge in pregnancy in third trimester   2. Braxton Hicks contractions     P:  Discharge home in stable condition Labor precautions Return to MAU if labor progresses   Duane LopeJennifer I Cagney Degrace, NP 09/27/2016 3:38 PM

## 2016-09-27 NOTE — Discharge Instructions (Signed)
Braxton Hicks Contractions Contractions of the uterus can occur throughout pregnancy. Contractions are not always a sign that you are in labor.  WHAT ARE BRAXTON HICKS CONTRACTIONS?  Contractions that occur before labor are called Braxton Hicks contractions, or false labor. Toward the end of pregnancy (32-34 weeks), these contractions can develop more often and may become more forceful. This is not true labor because these contractions do not result in opening (dilatation) and thinning of the cervix. They are sometimes difficult to tell apart from true labor because these contractions can be forceful and people have different pain tolerances. You should not feel embarrassed if you go to the hospital with false labor. Sometimes, the only way to tell if you are in true labor is for your health care provider to look for changes in the cervix. If there are no prenatal problems or other health problems associated with the pregnancy, it is completely safe to be sent home with false labor and await the onset of true labor. HOW CAN YOU TELL THE DIFFERENCE BETWEEN TRUE AND FALSE LABOR? False Labor   The contractions of false labor are usually shorter and not as hard as those of true labor.   The contractions are usually irregular.   The contractions are often felt in the front of the lower abdomen and in the groin.   The contractions may go away when you walk around or change positions while lying down.   The contractions get weaker and are shorter lasting as time goes on.   The contractions do not usually become progressively stronger, regular, and closer together as with true labor.  True Labor   Contractions in true labor last 30-70 seconds, become very regular, usually become more intense, and increase in frequency.   The contractions do not go away with walking.   The discomfort is usually felt in the top of the uterus and spreads to the lower abdomen and low back.   True labor can be  determined by your health care provider with an exam. This will show that the cervix is dilating and getting thinner.  WHAT TO REMEMBER  Keep up with your usual exercises and follow other instructions given by your health care provider.   Take medicines as directed by your health care provider.   Keep your regular prenatal appointments.   Eat and drink lightly if you think you are going into labor.   If Braxton Hicks contractions are making you uncomfortable:   Change your position from lying down or resting to walking, or from walking to resting.   Sit and rest in a tub of warm water.   Drink 2-3 glasses of water. Dehydration may cause these contractions.   Do slow and deep breathing several times an hour.  WHEN SHOULD I SEEK IMMEDIATE MEDICAL CARE? Seek immediate medical care if:  Your contractions become stronger, more regular, and closer together.   You have fluid leaking or gushing from your vagina.   You have a fever.   You pass blood-tinged mucus.   You have vaginal bleeding.   You have continuous abdominal pain.   You have low back pain that you never had before.   You feel your baby's head pushing down and causing pelvic pressure.   Your baby is not moving as much as it used to.  This information is not intended to replace advice given to you by your health care provider. Make sure you discuss any questions you have with your health care   provider. Document Released: 09/22/2005 Document Revised: 01/14/2016 Document Reviewed: 07/04/2013 Elsevier Interactive Patient Education  2017 Elsevier Inc. Introduction Patient Name: ________________________________________________ Patient Due Date: ____________________ What is a fetal movement count? A fetal movement count is the number of times that you feel your baby move during a certain amount of time. This may also be called a fetal kick count. A fetal movement count is recommended for every pregnant  woman. You may be asked to start counting fetal movements as early as week 28 of your pregnancy. Pay attention to when your baby is most active. You may notice your baby's sleep and wake cycles. You may also notice things that make your baby move more. You should do a fetal movement count:  When your baby is normally most active.  At the same time each day. A good time to count movements is while you are resting, after having something to eat and drink. How do I count fetal movements? 1. Find a quiet, comfortable area. Sit, or lie down on your side. 2. Write down the date, the start time and stop time, and the number of movements that you felt between those two times. Take this information with you to your health care visits. 3. For 2 hours, count kicks, flutters, swishes, rolls, and jabs. You should feel at least 10 movements during 2 hours. 4. You may stop counting after you have felt 10 movements. 5. If you do not feel 10 movements in 2 hours, have something to eat and drink. Then, keep resting and counting for 1 hour. If you feel at least 4 movements during that hour, you may stop counting. Contact a health care provider if:  You feel fewer than 4 movements in 2 hours.  Your baby is not moving like he or she usually does. Date: ____________ Start time: ____________ Stop time: ____________ Movements: ____________ Date: ____________ Start time: ____________ Stop time: ____________ Movements: ____________ Date: ____________ Start time: ____________ Stop time: ____________ Movements: ____________ Date: ____________ Start time: ____________ Stop time: ____________ Movements: ____________ Date: ____________ Start time: ____________ Stop time: ____________ Movements: ____________ Date: ____________ Start time: ____________ Stop time: ____________ Movements: ____________ Date: ____________ Start time: ____________ Stop time: ____________ Movements: ____________ Date: ____________ Start time:  ____________ Stop time: ____________ Movements: ____________ Date: ____________ Start time: ____________ Stop time: ____________ Movements: ____________ This information is not intended to replace advice given to you by your health care provider. Make sure you discuss any questions you have with your health care provider. Document Released: 10/22/2006 Document Revised: 05/21/2016 Document Reviewed: 11/01/2015 Elsevier Interactive Patient Education  2017 Elsevier Inc.  

## 2016-09-27 NOTE — Progress Notes (Signed)
Esignature pad not working for discharge, pt signed paper copy, put in her chart.

## 2016-09-27 NOTE — MAU Note (Signed)
PT states she felt a little trickle of clear fluid around 0545 when she went to the bathroom.  No VB/cramping.

## 2016-10-01 ENCOUNTER — Ambulatory Visit (INDEPENDENT_AMBULATORY_CARE_PROVIDER_SITE_OTHER): Payer: Medicaid Other | Admitting: Obstetrics and Gynecology

## 2016-10-01 ENCOUNTER — Encounter: Payer: Self-pay | Admitting: Obstetrics and Gynecology

## 2016-10-01 VITALS — BP 134/70 | HR 86 | Wt 228.4 lb

## 2016-10-01 DIAGNOSIS — Z1389 Encounter for screening for other disorder: Secondary | ICD-10-CM | POA: Diagnosis not present

## 2016-10-01 DIAGNOSIS — Z3A40 40 weeks gestation of pregnancy: Secondary | ICD-10-CM | POA: Diagnosis not present

## 2016-10-01 DIAGNOSIS — Z331 Pregnant state, incidental: Secondary | ICD-10-CM

## 2016-10-01 DIAGNOSIS — O480 Post-term pregnancy: Secondary | ICD-10-CM

## 2016-10-01 DIAGNOSIS — Z3483 Encounter for supervision of other normal pregnancy, third trimester: Secondary | ICD-10-CM

## 2016-10-01 LAB — POCT URINALYSIS DIPSTICK
Blood, UA: NEGATIVE
Glucose, UA: NEGATIVE
KETONES UA: NEGATIVE
LEUKOCYTES UA: NEGATIVE
Nitrite, UA: NEGATIVE
PROTEIN UA: NEGATIVE

## 2016-10-01 NOTE — Progress Notes (Addendum)
Patient ID: Monica Cox, female   DOB: [birth year], 25 y.o.   MRN: [id number]  [id number]  Estimated Date of Delivery: 10/06/16 LROB 122w2d  Blood pressure 134/70, pulse 86, weight 228 lb 6.4 oz (103.6 kg), last menstrual period 12/31/2015, not currently breastfeeding.    Urine results: notable for none  Chief Complaint  Patient presents with  . Routine Prenatal Visit   Patient complaints: none. She would like her cervical membrane stripped.   Patient reports good fetal movement. She denies any bleeding, rupture of membranes, or regular contractions.  Refer to the ob flow sheet for FH and FHR.    Physical Examination: General appearance - alert, well appearing, and in no distress                                      Abdomen - FH 38.5 cm,                                                         -FHR 123 bpm                                                         soft, nontender, nondistended, no masses or organomegaly                                      Pelvic - VULVA: normal appearing vulva with no masses, tenderness. Clean, well-healing wounds at the site of skin tag removals.      VAGINA: normal appearing vagina with normal color and discharge, no lesions,      CERVIX: normal appearing cervix without discharge or lesions, soft, 2 cm dilated, membrane stripped                                             Questions were answered. Assessment: LROB [id number] @ 222w2d   Healing well since removal of Molluscum tags gluteal area bilaterally 1. Membrane stripped   Plan:  Continued routine obstetrical care 1. Counseled pt on nipple stimulation to encourage labor  2. IOL at 40+4 to 41w if needed   Pt requests excision of Molluscum Contagiosum tags at delivery if possible  F/u in 1 weeks for routine prenatal care   By signing my name below, I, Doreatha MartinEva Mathews, attest that this documentation has been prepared under the direction and in the presence of Tilda BurrowJohn V Tomekia Helton, MD. Electronically Signed: Doreatha MartinEva  Mathews, ED Scribe. 10/01/16. 3:14 PM.  I personally performed the services described in this documentation, which was SCRIBED in my presence. The recorded information has been reviewed and considered accurate. It has been edited as necessary during review. Tilda BurrowFERGUSON,Delma Drone V, MD

## 2016-10-02 ENCOUNTER — Encounter: Payer: Medicaid Other | Admitting: Advanced Practice Midwife

## 2016-10-03 DIAGNOSIS — Z029 Encounter for administrative examinations, unspecified: Secondary | ICD-10-CM

## 2016-10-04 ENCOUNTER — Encounter (HOSPITAL_COMMUNITY): Payer: Self-pay | Admitting: *Deleted

## 2016-10-04 ENCOUNTER — Inpatient Hospital Stay (HOSPITAL_COMMUNITY)
Admission: AD | Admit: 2016-10-04 | Discharge: 2016-10-07 | DRG: 774 | Disposition: A | Discharge status: Home / Self Care | Payer: Medicaid Other | Source: Ambulatory Visit | Attending: Obstetrics and Gynecology | Admitting: Obstetrics and Gynecology

## 2016-10-04 DIAGNOSIS — O4202 Full-term premature rupture of membranes, onset of labor within 24 hours of rupture: Secondary | ICD-10-CM | POA: Diagnosis present

## 2016-10-04 DIAGNOSIS — O9852 Other viral diseases complicating childbirth: Secondary | ICD-10-CM | POA: Diagnosis present

## 2016-10-04 DIAGNOSIS — K219 Gastro-esophageal reflux disease without esophagitis: Secondary | ICD-10-CM | POA: Diagnosis present

## 2016-10-04 DIAGNOSIS — O9962 Diseases of the digestive system complicating childbirth: Secondary | ICD-10-CM | POA: Diagnosis present

## 2016-10-04 DIAGNOSIS — B081 Molluscum contagiosum: Secondary | ICD-10-CM | POA: Diagnosis present

## 2016-10-04 DIAGNOSIS — O99214 Obesity complicating childbirth: Secondary | ICD-10-CM | POA: Diagnosis present

## 2016-10-04 DIAGNOSIS — Z8249 Family history of ischemic heart disease and other diseases of the circulatory system: Secondary | ICD-10-CM | POA: Diagnosis not present

## 2016-10-04 DIAGNOSIS — Z833 Family history of diabetes mellitus: Secondary | ICD-10-CM

## 2016-10-04 DIAGNOSIS — Z3A39 39 weeks gestation of pregnancy: Secondary | ICD-10-CM

## 2016-10-04 DIAGNOSIS — Z87891 Personal history of nicotine dependence: Secondary | ICD-10-CM | POA: Diagnosis not present

## 2016-10-04 DIAGNOSIS — Z3483 Encounter for supervision of other normal pregnancy, third trimester: Secondary | ICD-10-CM

## 2016-10-04 DIAGNOSIS — Z6841 Body Mass Index (BMI) 40.0 and over, adult: Secondary | ICD-10-CM

## 2016-10-04 DIAGNOSIS — O4290 Premature rupture of membranes, unspecified as to length of time between rupture and onset of labor, unspecified weeks of gestation: Secondary | ICD-10-CM | POA: Diagnosis present

## 2016-10-04 HISTORY — DX: Trichomonal vulvovaginitis: A59.01

## 2016-10-04 HISTORY — DX: Infection of other part of genital tract in pregnancy, second trimester: O23.592

## 2016-10-04 HISTORY — DX: Dizziness and giddiness: R42

## 2016-10-04 HISTORY — DX: Personal history of other infectious and parasitic diseases: Z86.19

## 2016-10-04 LAB — POCT FERN TEST: POCT Fern Test: POSITIVE

## 2016-10-04 LAB — CBC
HCT: 26 % — ABNORMAL LOW (ref 36.0–46.0)
Hemoglobin: 8.6 g/dL — ABNORMAL LOW (ref 12.0–15.0)
MCH: 27.4 pg (ref 26.0–34.0)
MCHC: 33.1 g/dL (ref 30.0–36.0)
MCV: 82.8 fL (ref 78.0–100.0)
PLATELETS: 262 10*3/uL (ref 150–400)
RBC: 3.14 MIL/uL — ABNORMAL LOW (ref 3.87–5.11)
RDW: 14.2 % (ref 11.5–15.5)
WBC: 6.4 10*3/uL (ref 4.0–10.5)

## 2016-10-04 MED ORDER — OXYTOCIN 40 UNITS IN LACTATED RINGERS INFUSION - SIMPLE MED
1.0000 m[IU]/min | INTRAVENOUS | Status: DC
  Administered 2016-10-04: 2 m[IU]/min via INTRAVENOUS
  Filled 2016-10-04: qty 1000

## 2016-10-04 MED ORDER — LACTATED RINGERS IV SOLN
500.0000 mL | Freq: Once | INTRAVENOUS | Status: AC
  Administered 2016-10-04: 500 mL via INTRAVENOUS

## 2016-10-04 MED ORDER — OXYCODONE-ACETAMINOPHEN 5-325 MG PO TABS: 2.0000 | ORAL_TABLET | ORAL | Status: DC | PRN

## 2016-10-04 MED ORDER — PHENYLEPHRINE 40 MCG/ML (10ML) SYRINGE FOR IV PUSH (FOR BLOOD PRESSURE SUPPORT)
80.0000 ug | PREFILLED_SYRINGE | INTRAVENOUS | Status: DC | PRN
  Filled 2016-10-04: qty 10
  Filled 2016-10-04: qty 5

## 2016-10-04 MED ORDER — FENTANYL 2.5 MCG/ML BUPIVACAINE 1/10 % EPIDURAL INFUSION (WH - ANES)
14.0000 mL/h | INTRAMUSCULAR | Status: DC | PRN
  Administered 2016-10-05: 14 mL/h via EPIDURAL
  Filled 2016-10-04: qty 100

## 2016-10-04 MED ORDER — EPHEDRINE 5 MG/ML INJ
10.0000 mg | INTRAVENOUS | Status: DC | PRN
  Filled 2016-10-04: qty 4

## 2016-10-04 MED ORDER — OXYCODONE-ACETAMINOPHEN 5-325 MG PO TABS: 1.0000 | ORAL_TABLET | ORAL | Status: DC | PRN

## 2016-10-04 MED ORDER — ONDANSETRON HCL 4 MG/2ML IJ SOLN: 4.0000 mg | Freq: Four times a day (QID) | INTRAMUSCULAR | Status: DC | PRN

## 2016-10-04 MED ORDER — FLEET ENEMA 7-19 GM/118ML RE ENEM: 1.0000 | ENEMA | RECTAL | Status: DC | PRN

## 2016-10-04 MED ORDER — OXYTOCIN 40 UNITS IN LACTATED RINGERS INFUSION - SIMPLE MED: 2.5000 [IU]/h | INTRAVENOUS | Status: DC

## 2016-10-04 MED ORDER — TERBUTALINE SULFATE 1 MG/ML IJ SOLN
0.2500 mg | Freq: Once | INTRAMUSCULAR | Status: DC | PRN
  Filled 2016-10-04: qty 1

## 2016-10-04 MED ORDER — DIPHENHYDRAMINE HCL 50 MG/ML IJ SOLN: 12.5000 mg | INTRAMUSCULAR | Status: DC | PRN

## 2016-10-04 MED ORDER — LIDOCAINE HCL (PF) 1 % IJ SOLN
30.0000 mL | INTRAMUSCULAR | Status: AC | PRN
  Administered 2016-10-05: 30 mL via SUBCUTANEOUS
  Filled 2016-10-04: qty 30

## 2016-10-04 MED ORDER — LACTATED RINGERS IV SOLN: 500.0000 mL | INTRAVENOUS | Status: DC | PRN

## 2016-10-04 MED ORDER — PHENYLEPHRINE 40 MCG/ML (10ML) SYRINGE FOR IV PUSH (FOR BLOOD PRESSURE SUPPORT)
80.0000 ug | PREFILLED_SYRINGE | INTRAVENOUS | Status: DC | PRN
  Filled 2016-10-04: qty 5

## 2016-10-04 MED ORDER — ACETAMINOPHEN 325 MG PO TABS: 650.0000 mg | ORAL_TABLET | ORAL | Status: DC | PRN

## 2016-10-04 MED ORDER — SOD CITRATE-CITRIC ACID 500-334 MG/5ML PO SOLN
30.0000 mL | ORAL | Status: DC | PRN
Start: 2016-10-04 — End: 2016-10-05

## 2016-10-04 MED ORDER — OXYTOCIN BOLUS FROM INFUSION
500.0000 mL | Freq: Once | INTRAVENOUS | Status: AC
  Administered 2016-10-05: 500 mL via INTRAVENOUS

## 2016-10-04 MED ORDER — LACTATED RINGERS IV SOLN
INTRAVENOUS | Status: DC
  Administered 2016-10-04: 10:00:00 via INTRAVENOUS

## 2016-10-04 MED ORDER — FENTANYL CITRATE (PF) 100 MCG/2ML IJ SOLN: 100.0000 ug | INTRAMUSCULAR | Status: DC | PRN

## 2016-10-04 NOTE — H&P (Signed)
HPI: Monica Cox is a 25 y.o. year old 134P2012 female at 1568w5d weeks gestation who presents to MAU reporting Spontaneous rupture of membranes at 0645 am. Rare contractions. No bleeding. Normal fetal mvmt.   Has Molluscum Contagiosum. Some of lesions removed third trimester. Has had PCR and serum testing for HSV--all negative. No new lesions.    [facility]  Initiated Care at  11wks  FOB Zettie CooleyLemar Lee- getting married 9/2!!!  Dating By LMP c/w 7wk u/s  Pap 03/19/2016 neg  GC/CT Initial: -/-               36+wks: -/-  Genetic Screen NT/IT: neg  CF screen Neg prev preg  Anatomic US Normal female  Flu vaccine 07/02/16   Tdap Recommended ~ 28wks  Glucose Screen  2 hr normal: 78/118/121  GBS neg  Feed Preference undecided  Contraception pills  Circumcision n/a  Childbirth Classes declined  Pediatrician [facility] [place]    OB History    Gravida Para Term Preterm AB Living   4 2 2  0 1 2   SAB TAB Ectopic Multiple Live Births   1 0 0 0 2     Past Medical History:  Diagnosis Date  . Anemia   . Asthma   . Nausea 02/06/2016  . Pregnant 02/06/2016  . Reflux   . Smoker 02/06/2016  . Trichomonas infection 08-08-2014  . Vaginal Pap smear, abnormal    Past Surgical History:  Procedure Laterality Date  . DILATION AND CURETTAGE OF UTERUS     [facility] Dr. Despina HiddenEure performed  . ESOPHAGOGASTRODUODENOSCOPY ENDOSCOPY     Family History: family history includes Diabetes in her paternal grandmother; Hypertension in her father and mother; Ulcers in her father, paternal aunt, and paternal grandmother. Social History:  reports that she has quit smoking. Her smoking use included Cigars. She quit after 3.00 years of use. She has never used smokeless tobacco. She reports that she does not drink alcohol or use drugs.     Maternal Diabetes: No Genetic Screening: Normal Maternal Ultrasounds/Referrals: Normal Fetal Ultrasounds or other Referrals:  None Maternal Substance Abuse:  No Significant Maternal  Medications:  None Significant Maternal Lab Results:  None Other Comments:  None  Review of Systems  Constitutional: Negative for chills and fever.  Eyes: Negative for blurred vision.  Genitourinary:       Pos LOF. Mild contractions. Neg VB. No emerging lesions or pain from existing lesions.   Neurological: Negative for headaches.   Maternal Medical History:  Reason for admission: Rupture of membranes.   Contractions: Onset was 3-5 hours ago.   Frequency: rare.   Perceived severity is mild.    Fetal activity: Perceived fetal activity is normal.   Last perceived fetal movement was within the past hour.    Prenatal Complications - Diabetes: none.      Blood pressure 128/74, pulse 94, temperature 98.1 F (36.7 C), temperature source Oral, resp. rate 16, last menstrual period 12/31/2015, SpO2 100 %, not currently breastfeeding. Maternal Exam:  Uterine Assessment: Contraction strength is mild.  Contraction frequency is rare.   Abdomen: Estimated fetal weight is 8 lb.   Fetal presentation: vertex  Introitus: Vulva is positive for lesion (C/W intact and recently incised Molluscom Contagiosum ). Normal vagina.  Ferning test: positive.  Amniotic fluid character: clear.  Pelvis: adequate for delivery.   Cervix: Cervix evaluated by sterile speculum exam and digital exam.     Fetal Exam Fetal Monitor Review: Mode: ultrasound.  Baseline rate: 130.  Variability: moderate (6-25 bpm).   Pattern: accelerations present and no decelerations.    Fetal State Assessment: Category I - tracings are normal.     Physical Exam  Nursing note and vitals reviewed. Constitutional: She is oriented to person, place, and time. She appears well-developed and well-nourished. No distress.  Eyes: Conjunctivae are normal. No scleral icterus.  Cardiovascular: Normal rate, regular rhythm and normal heart sounds.   Respiratory: Effort normal and breath sounds normal.  GI: Soft. There is no  tenderness.  Genitourinary: Vagina normal.    Vulva exhibits lesion (C/W intact and recently incised Molluscom Contagiosum ).  Musculoskeletal: She exhibits no edema or tenderness.  Neurological: She is alert and oriented to person, place, and time. She has normal reflexes.  Skin: Skin is warm and dry.  Psychiatric: She has a normal mood and affect.    Prenatal labs: ABO, Rh:   Antibody: Negative (10/13 0910) Rubella: 2.26 (06/14 1455) RPR: Non Reactive (10/13 0910)  HBsAg: Negative (06/14 1455)  HIV: Non Reactive (10/13 0910)  GBS: Negative (12/13 1630)   Assessment: 1. Labor: PROM 2. Fetal Wellbeing: Category I  3. Pain Control: None 4. GBS: Neg 5. 39.5 week IUP 6. Molluscum Contagiosum   Plan:  1. Admit to BS per consult with MD 2. Routine L&D orders 3. Analgesia/anesthesia PRN  4. Discussed expectant management vs augmentation. Pt want s to manage expectantly for a few more hours.  5. Intermittent monitoring, NSL, light laboring diet, May ambulate in hall.  [place] Aaryana Betke 10/04/2016, 9:15 AM

## 2016-10-04 NOTE — Anesthesia Preprocedure Evaluation (Signed)
Anesthesia Evaluation  Patient identified by MRN, date of birth, ID band Patient awake    Reviewed: Allergy & Precautions, Patient's Chart, lab work & pertinent test results  Airway Mallampati: II       Dental no notable dental hx. (+) Teeth Intact   Pulmonary asthma , former smoker,    Pulmonary exam normal breath sounds clear to auscultation       Cardiovascular negative cardio ROS Normal cardiovascular exam+ Valvular Problems/Murmurs  Rhythm:Regular Rate:Normal     Neuro/Psych PSYCHIATRIC DISORDERS negative neurological ROS     GI/Hepatic Neg liver ROS, GERD  Medicated and Controlled,  Endo/Other  Morbid obesity  Renal/GU negative Renal ROS  negative genitourinary   Musculoskeletal negative musculoskeletal ROS (+)   Abdominal (+) + obese,   Peds  Hematology  (+) anemia ,   Anesthesia Other Findings   Reproductive/Obstetrics (+) Pregnancy                             Anesthesia Physical Anesthesia Plan  ASA: III  Anesthesia Plan: Epidural   Post-op Pain Management:    Induction:   Airway Management Planned: Natural Airway  Additional Equipment:   Intra-op Plan:   Post-operative Plan:   Informed Consent: I have reviewed the patients History and Physical, chart, labs and discussed the procedure including the risks, benefits and alternatives for the proposed anesthesia with the patient or authorized representative who has indicated his/her understanding and acceptance.     Plan Discussed with: Anesthesiologist  Anesthesia Plan Comments:         Anesthesia Quick Evaluation

## 2016-10-04 NOTE — MAU Note (Signed)
Pt states she thinks that her water broke at 0645 and she is not having any contractions.  Pt states she is feeling the baby move.

## 2016-10-04 NOTE — Progress Notes (Signed)
Patient ID: Monica Cox, female   DOB: July 05, 1991, 25 y.o.   MRN: [id number] Monica Cox is a 25 y.o. [id number] at 1043w5d.  Subjective: Mild discomfort w/ UC's. No increase in UC's w/ nipple stim.  Objective: BP 131/63   Pulse 88   Temp 98.4 F (36.9 C) (Oral)   Resp 16   Ht 5' 3.5" (1.613 m)   Wt 238 lb 3.2 oz (108 kg)   LMP 12/31/2015 (Exact Date)   SpO2 100%   BMI 41.53 kg/m    FHT:  FHR: 130 bpm, variability: mod w/ intermittent monitoring UC:   Irreg, mild Dilation: 3 Effacement (%): 70 Cervical Position: Posterior Station: -3 Presentation: Vertex Exam by:: Monica Cox CNM  Labs:   Assessment / Plan: 6243w5d week IUP Labor: PROM, no labor after expectant management and nipple stim. Recommend Pitocin Augmentation. Pt is agreeable.  Fetal Wellbeing:  Reassuring per intermittent monitoring Pain Control:  None Anticipated MOD:  SVD Start pitocin.  [place] Cox, [place] 10/04/2016 6:39 PM

## 2016-10-04 NOTE — Anesthesia Pain Management Evaluation Note (Signed)
  CRNA Pain Management Visit Note  Patient: Monica Cox, 25 y.o., female  "Hello [facility] am a [facility]. We have an anesthesia team available at all times to provide care throughout the hospital, including epidural management and anesthesia for C-section. I don't know your plan for the delivery whether it a natural birth, water birth, IV sedation, nitrous supplementation, doula or epidural, but we want to meet your pain goals."   1.Was your pain managed to your expectations on prior hospitalizations?   Yes   2.What is your expectation for pain management during this hospitalization?     Epidural  3.How can we help you reach that goal? epidural  Record the patient's initial score and the patient's pain goal.   Pain: 0  Pain Goal: 3 [facility] wants you to be able to say your pain was always managed very well.  [facility] SouthWRINKLE,Monica Cox 10/04/2016

## 2016-10-05 ENCOUNTER — Encounter (HOSPITAL_COMMUNITY): Payer: Self-pay | Admitting: General Practice

## 2016-10-05 ENCOUNTER — Inpatient Hospital Stay (HOSPITAL_COMMUNITY): Payer: Medicaid Other | Admitting: Anesthesiology

## 2016-10-05 DIAGNOSIS — K219 Gastro-esophageal reflux disease without esophagitis: Secondary | ICD-10-CM

## 2016-10-05 DIAGNOSIS — O9852 Other viral diseases complicating childbirth: Secondary | ICD-10-CM

## 2016-10-05 DIAGNOSIS — B081 Molluscum contagiosum: Secondary | ICD-10-CM

## 2016-10-05 DIAGNOSIS — O4202 Full-term premature rupture of membranes, onset of labor within 24 hours of rupture: Secondary | ICD-10-CM

## 2016-10-05 DIAGNOSIS — O9962 Diseases of the digestive system complicating childbirth: Secondary | ICD-10-CM

## 2016-10-05 DIAGNOSIS — Z87891 Personal history of nicotine dependence: Secondary | ICD-10-CM

## 2016-10-05 DIAGNOSIS — Z3A39 39 weeks gestation of pregnancy: Secondary | ICD-10-CM

## 2016-10-05 DIAGNOSIS — O99214 Obesity complicating childbirth: Secondary | ICD-10-CM

## 2016-10-05 LAB — CBC
HEMATOCRIT: 25.6 % — AB (ref 36.0–46.0)
Hemoglobin: 8.6 g/dL — ABNORMAL LOW (ref 12.0–15.0)
MCH: 27.7 pg (ref 26.0–34.0)
MCHC: 33.6 g/dL (ref 30.0–36.0)
MCV: 82.3 fL (ref 78.0–100.0)
PLATELETS: 234 10*3/uL (ref 150–400)
RBC: 3.11 MIL/uL — AB (ref 3.87–5.11)
RDW: 14.1 % (ref 11.5–15.5)
WBC: 8.9 10*3/uL (ref 4.0–10.5)

## 2016-10-05 LAB — RPR: RPR Ser Ql: NONREACTIVE

## 2016-10-05 MED ORDER — OXYCODONE-ACETAMINOPHEN 5-325 MG PO TABS
1.0000 | ORAL_TABLET | ORAL | Status: DC | PRN
  Administered 2016-10-05 – 2016-10-06 (×6): 2 via ORAL
  Administered 2016-10-07: 1 via ORAL
  Administered 2016-10-07: 2 via ORAL
  Filled 2016-10-05 (×7): qty 2
  Filled 2016-10-05: qty 1

## 2016-10-05 MED ORDER — TETANUS-DIPHTH-ACELL PERTUSSIS 5-2.5-18.5 LF-MCG/0.5 IM SUSP: 0.5000 mL | Freq: Once | INTRAMUSCULAR | Status: DC

## 2016-10-05 MED ORDER — SODIUM BICARBONATE 8.4 % IV SOLN
INTRAVENOUS | Status: DC | PRN
  Administered 2016-10-05 (×2): 5 mL via EPIDURAL

## 2016-10-05 MED ORDER — SODIUM CHLORIDE 0.9 % IV SOLN: 250.0000 mL | INTRAVENOUS | Status: DC | PRN

## 2016-10-05 MED ORDER — IBUPROFEN 600 MG PO TABS
600.0000 mg | ORAL_TABLET | Freq: Four times a day (QID) | ORAL | Status: DC
  Administered 2016-10-05 – 2016-10-07 (×10): 600 mg via ORAL
  Filled 2016-10-05 (×10): qty 1

## 2016-10-05 MED ORDER — ONDANSETRON HCL 4 MG PO TABS
4.0000 mg | ORAL_TABLET | ORAL | Status: DC | PRN
  Administered 2016-10-06 – 2016-10-07 (×3): 4 mg via ORAL
  Filled 2016-10-05 (×3): qty 1

## 2016-10-05 MED ORDER — DIBUCAINE 1 % RE OINT: 1.0000 "application " | TOPICAL_OINTMENT | RECTAL | Status: DC | PRN

## 2016-10-05 MED ORDER — ZOLPIDEM TARTRATE 5 MG PO TABS: 5.0000 mg | ORAL_TABLET | Freq: Every evening | ORAL | Status: DC | PRN

## 2016-10-05 MED ORDER — WITCH HAZEL-GLYCERIN EX PADS: 1.0000 "application " | MEDICATED_PAD | CUTANEOUS | Status: DC | PRN

## 2016-10-05 MED ORDER — LIDOCAINE HCL (PF) 1 % IJ SOLN
INTRAMUSCULAR | Status: DC | PRN
  Administered 2016-10-05 (×2): 4 mL via EPIDURAL

## 2016-10-05 MED ORDER — COCONUT OIL OIL: 1.0000 "application " | TOPICAL_OIL | Status: DC | PRN

## 2016-10-05 MED ORDER — ESCITALOPRAM OXALATE 10 MG PO TABS
10.0000 mg | ORAL_TABLET | Freq: Every day | ORAL | Status: DC
  Administered 2016-10-05 – 2016-10-06 (×2): 10 mg via ORAL
  Filled 2016-10-05 (×4): qty 1

## 2016-10-05 MED ORDER — ONDANSETRON HCL 4 MG/2ML IJ SOLN
4.0000 mg | INTRAMUSCULAR | Status: DC | PRN
  Administered 2016-10-05: 4 mg via INTRAVENOUS
  Filled 2016-10-05: qty 2

## 2016-10-05 MED ORDER — BENZOCAINE-MENTHOL 20-0.5 % EX AERO
1.0000 "application " | INHALATION_SPRAY | CUTANEOUS | Status: DC | PRN
  Administered 2016-10-05: 1 via TOPICAL
  Filled 2016-10-05 (×2): qty 56

## 2016-10-05 MED ORDER — SENNOSIDES-DOCUSATE SODIUM 8.6-50 MG PO TABS
2.0000 | ORAL_TABLET | ORAL | Status: DC
  Administered 2016-10-06 – 2016-10-07 (×2): 2 via ORAL
  Filled 2016-10-05 (×2): qty 2

## 2016-10-05 MED ORDER — ACETAMINOPHEN 325 MG PO TABS
650.0000 mg | ORAL_TABLET | ORAL | Status: DC | PRN
  Administered 2016-10-05: 650 mg via ORAL
  Filled 2016-10-05: qty 2

## 2016-10-05 MED ORDER — OXYCODONE-ACETAMINOPHEN 5-325 MG PO TABS
1.0000 | ORAL_TABLET | ORAL | Status: DC | PRN
  Administered 2016-10-05 (×2): 1 via ORAL
  Filled 2016-10-05 (×2): qty 1

## 2016-10-05 MED ORDER — PRENATAL MULTIVITAMIN CH
1.0000 | ORAL_TABLET | Freq: Every day | ORAL | Status: DC
  Administered 2016-10-05 – 2016-10-07 (×3): 1 via ORAL
  Filled 2016-10-05 (×3): qty 1

## 2016-10-05 MED ORDER — SODIUM CHLORIDE 0.9% FLUSH
3.0000 mL | Freq: Two times a day (BID) | INTRAVENOUS | Status: DC
  Administered 2016-10-05: 3 mL via INTRAVENOUS

## 2016-10-05 MED ORDER — SIMETHICONE 80 MG PO CHEW
80.0000 mg | CHEWABLE_TABLET | ORAL | Status: DC | PRN
  Administered 2016-10-07: 80 mg via ORAL
  Filled 2016-10-05: qty 1

## 2016-10-05 MED ORDER — SODIUM CHLORIDE 0.9% FLUSH: 3.0000 mL | INTRAVENOUS | Status: DC | PRN

## 2016-10-05 MED ORDER — OXYTOCIN 40 UNITS IN LACTATED RINGERS INFUSION - SIMPLE MED: 2.5000 [IU]/h | INTRAVENOUS | Status: DC | PRN

## 2016-10-05 MED ORDER — DIPHENHYDRAMINE HCL 25 MG PO CAPS
25.0000 mg | ORAL_CAPSULE | Freq: Four times a day (QID) | ORAL | Status: DC | PRN
  Administered 2016-10-05: 25 mg via ORAL
  Filled 2016-10-05: qty 1

## 2016-10-05 NOTE — Plan of Care (Signed)
Problem: Nutritional: Goal: Mothers verbalization of comfort with breastfeeding process will improve Outcome: Not Progressing Mother has decided to exclusively formula/bottle feed

## 2016-10-05 NOTE — Procedures (Signed)
PROCEDURE:  SKIN TAG REMOVAL  PATIENT: Monica Cox  INDICATION: Pain and discomfort from skin tags  ANESTHESIA: Epidural and 6cc of 1% lidocaine without epinephrine  FINDINGS: 4 skin tags on right groin area and 2 skin tags on left groin area  CONSENT: Risks were explained to the patient, including but not limited to: infection, bleeding, cosmetic defects, pain. Written consent was obtained, timeout performed.   PROCEDURE DESCRIPTION:  6 cc total of 1% Lidocaine was injected underneath all 6 skin tags in the bilateral groin area. Area was sterilized with betadine, then 4 skin tags on the right groin and 2 skin tags in the left groin area were removed using a #11 blade scalpel. Silver nitrite sticks were used for good hemostasis.   EBL: 5cc  PATHOLOGY: 6 skin tags  Patient tolerated the procedure well.   Cleda ClarksElizabeth W. Mumaw, DO  OB Fellow [facility] for [facility], [facility]

## 2016-10-05 NOTE — Lactation Note (Signed)
This note was copied from a baby's chart. Lactation Consultation Note  Per Tana CoastBeth Earle RN, mother has decided to only formula feed.  Patient Name: Monica Cox AVWUJ'WToday's Date: 10/05/2016     Maternal Data Formula Feeding for Exclusion: Yes Reason for exclusion: Mother's choice to formula feed on admision Does the patient have breastfeeding experience prior to this delivery?: Yes  Feeding Feeding Type: Bottle Fed - Formula Nipple Type: Slow - flow  LATCH Score/Interventions                      Lactation Tools Discussed/Used     Consult Status      Hardie PulleyBerkelhammer, Ruth Boschen 10/05/2016, 11:03 AM

## 2016-10-05 NOTE — Anesthesia Postprocedure Evaluation (Signed)
Anesthesia Post Note  Patient: Monica PeckBriana N Pickard  Procedure(s) Performed: * No procedures listed *  Patient location during evaluation: Mother Baby Anesthesia Type: Epidural Level of consciousness: awake and alert Pain management: pain level controlled Vital Signs Assessment: post-procedure vital signs reviewed and stable Respiratory status: spontaneous breathing, nonlabored ventilation and respiratory function stable Cardiovascular status: stable Postop Assessment: no headache, no backache and epidural receding Anesthetic complications: no        Last Vitals:  Vitals:   10/05/16 0508 10/05/16 0634  BP: (!) 149/73 139/76  Pulse: 86 83  Resp: 18 18  Temp: 37 C 36.9 C    Last Pain:  Vitals:   10/05/16 0634  TempSrc:   PainSc: 8    Pain Goal: Patients Stated Pain Goal: 2 (10/04/16 2300)               Junious SilkGILBERT,Nikia Mangino

## 2016-10-05 NOTE — Plan of Care (Signed)
Problem: Education: Goal: Knowledge of condition will improve Outcome: Completed/Met Date Met: 10/05/16 Pt understands how to put dermoplast spray on skin tags removed from groin and how to wash sites and keep clean.

## 2016-10-05 NOTE — Progress Notes (Addendum)
Patient ID: Monica Cox, female   DOB: [birth year], 25 y.o.   MRN: [id number]  S: Patient seen & examined for progress of labor. Patient comfortable with epidural, just recently placed.    O:  Vitals:   10/05/16 0015 10/05/16 0020 10/05/16 0025 10/05/16 0030  BP: 128/88 121/68 120/73 119/76  Pulse: 80 76 76 82  Resp: 18 20 20 20   Temp:      TempSrc:      SpO2: 100% 100% 99% 99%  Weight:      Height:        CERVICAL EXAM:  8 cm / 90% / +1 to +2  FHT: 120 bpm, mod var, +accels, a couple variable decels with return to baseline in <511min TOCO: q352min   A/P: Continue pitocin Continue expectant management Anticipate SVD

## 2016-10-05 NOTE — Anesthesia Procedure Notes (Signed)
Epidural Patient location during procedure: OB Start time: 10/05/2016 12:08 AM  Staffing Anesthesiologist: Mal AmabileFOSTER, Flavio Lindroth  Preanesthetic Checklist Completed: patient identified, site marked, surgical consent, pre-op evaluation, timeout performed, IV checked, risks and benefits discussed and monitors and equipment checked  Epidural Patient position: sitting Prep: site prepped and draped and DuraPrep Patient monitoring: continuous pulse ox and blood pressure Approach: midline Location: L3-L4 Injection technique: LOR air  Needle:  Needle type: Tuohy  Needle gauge: 17 G Needle length: 9 cm and 9 Needle insertion depth: 7 cm Catheter type: closed end flexible Catheter size: 19 Gauge Catheter at skin depth: 12 cm Test dose: negative and Other  Assessment Events: blood not aspirated, injection not painful, no injection resistance, negative IV test and no paresthesia  Additional Notes Patient identified. Risks and benefits discussed including failed block, incomplete  Pain control, post dural puncture headache, nerve damage, paralysis, blood pressure Changes, nausea, vomiting, reactions to medications-both toxic and allergic and post Partum back pain. All questions were answered. Patient expressed understanding and wished to proceed. Sterile technique was used throughout procedure. Epidural site was Dressed with sterile barrier dressing. No paresthesias, signs of intravascular injection Or signs of intrathecal spread were encountered.  Patient was more comfortable after the epidural was dosed. Please see RN's note for documentation of vital signs and FHR which are stable.

## 2016-10-06 ENCOUNTER — Encounter (HOSPITAL_COMMUNITY): Payer: Self-pay | Admitting: General Practice

## 2016-10-06 MED ORDER — RHO D IMMUNE GLOBULIN 1500 UNIT/2ML IJ SOSY
300.0000 ug | PREFILLED_SYRINGE | Freq: Once | INTRAMUSCULAR | Status: AC
  Administered 2016-10-06: 300 ug via INTRAVENOUS
  Filled 2016-10-06: qty 2

## 2016-10-06 NOTE — Progress Notes (Signed)
  Post Partum Day 1 Subjective: no complaints, up ad lib, voiding and tolerating PO  Objective: Blood pressure 125/81, pulse 70, temperature 98 F (36.7 C), temperature source Oral, resp. rate 16, height 5' 3.5" (1.613 m), weight 238 lb 3.2 oz (108 kg), last menstrual period 12/31/2015, SpO2 99 %, unknown if currently breastfeeding.  Physical Exam:  General: alert, cooperative, appears stated age and no distress Lochia: appropriate Uterine Fundus: firm Incision: n/a DVT Evaluation: No evidence of DVT seen on physical exam.   Recent Labs  10/04/16 0936 10/05/16 0632  HGB 8.6* 8.6*  HCT 26.0* 25.6*    Assessment/Plan: Plan for discharge tomorrow   LOS: 2 days   Monica Cox 10/06/2016, 8:39 AM

## 2016-10-07 LAB — RH IG WORKUP (INCLUDES ABO/RH)
ABO/RH(D): O NEG
Fetal Screen: NEGATIVE
GESTATIONAL AGE(WKS): 39.6
Unit division: 0

## 2016-10-07 MED ORDER — ONDANSETRON HCL 4 MG PO TABS: 4.0000 mg | ORAL_TABLET | ORAL | 0 refills | Status: DC | PRN

## 2016-10-07 MED ORDER — OXYCODONE HCL 5 MG PO CAPS: 5.0000 mg | ORAL_CAPSULE | ORAL | 0 refills | Status: DC | PRN

## 2016-10-07 MED ORDER — ACETAMINOPHEN 325 MG PO TABS: 650.0000 mg | ORAL_TABLET | ORAL | 0 refills | Status: DC | PRN

## 2016-10-07 MED ORDER — IBUPROFEN 600 MG PO TABS: 600.0000 mg | ORAL_TABLET | Freq: Four times a day (QID) | ORAL | 0 refills | Status: DC
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
## 2016-10-07 NOTE — Discharge Summary (Signed)
OB Discharge Summary     Patient Name: Monica Cox DOB: 04-25-1991 MRN: [id number]  Date of admission: 10/04/2016 Delivering MD: Jen Mow [facility]   Date of discharge: 10/07/2016  Admitting diagnosis: 39 weeks and water breaking  Intrauterine pregnancy: [redacted]w[redacted]d     Secondary diagnosis:  Active Problems:   PROM (premature rupture of membranes)  Additional problems: none     Discharge diagnosis: Term Pregnancy Delivered                                                                                                Post partum procedures:none  Augmentation: Pitocin  Complications: None  Hospital course:  Onset of Labor With Vaginal Delivery     26 y.o. yo [id number] at [redacted]w[redacted]d was admitted in Latent Labor on 10/04/2016. Patient had an uncomplicated labor course as follows:  Membrane Rupture Time/Date: 6:45 AM ,10/04/2016   Intrapartum Procedures: Episiotomy: None [1]                                         Lacerations:  None [1]  Patient had a delivery of a Viable infant. 10/05/2016  Information for the patient's newborn:  Peony, Barner [[id number]]  Delivery Method: Vaginal, Spontaneous Delivery (Filed from Delivery Summary)    Pateint had an uncomplicated postpartum course.  She is ambulating, tolerating a regular diet, passing flatus, and urinating well. Patient is discharged home in stable condition on 10/07/16.    Physical exam Vitals:   10/05/16 1736 10/06/16 0500 10/06/16 1814 10/07/16 0538  BP: 123/77 125/81 121/71 133/69  Pulse: 70 70 76 66  Resp: 16 16 18 18   Temp: 98 F (36.7 C) 98 F (36.7 C) 97.5 F (36.4 C) 97.9 F (36.6 C)  TempSrc: Oral Oral Oral Oral  SpO2:   100%   Weight:      Height:       General: alert, cooperative and no distress Lochia: appropriate Uterine Fundus: firm Incision: N/A DVT Evaluation: No evidence of DVT seen on physical exam. Labs: Lab Results  Component Value Date   WBC 8.9 10/05/2016   HGB 8.6 (L)  10/05/2016   HCT 25.6 (L) 10/05/2016   MCV 82.3 10/05/2016   PLT 234 10/05/2016   CMP Latest Ref Rng & Units 12/22/2015  Glucose 65 - 99 mg/dL 88  BUN 6 - 20 mg/dL 11  Creatinine 9.52 - 8.41 mg/dL 3.24  Sodium 401 - 027 mmol/L 138  Potassium 3.5 - 5.1 mmol/L 3.6  Chloride 101 - 111 mmol/L 104  CO2 22 - 32 mmol/L 26  Calcium 8.9 - 10.3 mg/dL 9.5  Total Protein 6.0 - 8.3 g/dL -  Total Bilirubin 0.3 - 1.2 mg/dL -  Alkaline Phos 39 - 253 U/L -  AST 0 - 37 U/L -  ALT 0 - 35 U/L -    Discharge instruction: per After Visit Summary and "Baby and Me Booklet".  After visit meds:  Allergies as of 10/07/2016   No Known  Allergies     Medication List    STOP taking these medications   diphenhydramine-acetaminophen 25-500 MG Tabs tablet Commonly known as:  TYLENOL PM     TAKE these medications   acetaminophen 325 MG tablet Commonly known as:  TYLENOL Take 2 tablets (650 mg total) by mouth every 4 (four) hours as needed (for pain scale < 4).   albuterol 108 (90 Base) MCG/ACT inhaler Commonly known as:  PROVENTIL HFA;VENTOLIN HFA Inhale 1-2 puffs into the lungs every 4 (four) hours as needed for wheezing or shortness of breath.   escitalopram 10 MG tablet Commonly known as:  LEXAPRO Take 10 mg by mouth at bedtime.   ibuprofen 600 MG tablet Commonly known as:  ADVIL,MOTRIN Take 1 tablet (600 mg total) by mouth every 6 (six) hours.   ondansetron 4 MG tablet Commonly known as:  ZOFRAN Take 1 tablet (4 mg total) by mouth every 4 (four) hours as needed for nausea.   oxycodone 5 MG capsule Commonly known as:  OXY-IR Take 1 capsule (5 mg total) by mouth every 4 (four) hours as needed.   promethazine 25 MG tablet Commonly known as:  PHENERGAN Take 12.5-25 mg by mouth every 6 (six) hours as needed for nausea or vomiting.       Diet: routine diet  Activity: Advance as tolerated. Pelvic rest for 6 weeks.   Outpatient follow up:6 weeks Follow up Appt:Future Appointments Date  Time Provider Department Center  10/09/2016 4:00 PM Tilda BurrowJohn V Ferguson, MD [facility]   Follow up Visit:No Follow-up on file.  Postpartum contraception: Undecided  Newborn Data: Live born female  Birth Weight: 8 lb 8.3 oz (3864 g) APGAR: 9, 9  Baby Feeding: Breast Disposition:home with mother   10/07/2016 Ernestina PennaNicholas Azelyn Batie, MD

## 2016-10-07 NOTE — Discharge Instructions (Signed)

## 2016-10-08 ENCOUNTER — Telehealth: Payer: Self-pay | Admitting: *Deleted

## 2016-10-08 ENCOUNTER — Emergency Department (HOSPITAL_COMMUNITY)
Admission: EM | Admit: 2016-10-08 | Discharge: 2016-10-08 | Disposition: A | Discharge status: Home / Self Care | Payer: Medicaid Other | Attending: Emergency Medicine | Admitting: Emergency Medicine

## 2016-10-08 ENCOUNTER — Emergency Department (HOSPITAL_BASED_OUTPATIENT_CLINIC_OR_DEPARTMENT_OTHER): Payer: Medicaid Other

## 2016-10-08 ENCOUNTER — Emergency Department (HOSPITAL_COMMUNITY): Payer: Medicaid Other

## 2016-10-08 ENCOUNTER — Other Ambulatory Visit (HOSPITAL_COMMUNITY): Payer: Self-pay | Admitting: *Deleted

## 2016-10-08 ENCOUNTER — Encounter (HOSPITAL_COMMUNITY): Payer: Self-pay | Admitting: Emergency Medicine

## 2016-10-08 DIAGNOSIS — J45909 Unspecified asthma, uncomplicated: Secondary | ICD-10-CM | POA: Diagnosis not present

## 2016-10-08 DIAGNOSIS — Z87891 Personal history of nicotine dependence: Secondary | ICD-10-CM | POA: Diagnosis not present

## 2016-10-08 DIAGNOSIS — R0602 Shortness of breath: Secondary | ICD-10-CM | POA: Diagnosis not present

## 2016-10-08 DIAGNOSIS — R0600 Dyspnea, unspecified: Secondary | ICD-10-CM | POA: Diagnosis not present

## 2016-10-08 DIAGNOSIS — O9953 Diseases of the respiratory system complicating the puerperium: Secondary | ICD-10-CM | POA: Diagnosis present

## 2016-10-08 DIAGNOSIS — Z79899 Other long term (current) drug therapy: Secondary | ICD-10-CM | POA: Diagnosis not present

## 2016-10-08 LAB — TYPE AND SCREEN
ABO/RH(D): O NEG
ANTIBODY SCREEN: POSITIVE
DAT, IgG: NEGATIVE
Unit division: 0
Unit division: 0

## 2016-10-08 LAB — BASIC METABOLIC PANEL
Anion gap: 7 (ref 5–15)
BUN: 9 mg/dL (ref 6–20)
CALCIUM: 8.2 mg/dL — AB (ref 8.9–10.3)
CO2: 25 mmol/L (ref 22–32)
CREATININE: 0.7 mg/dL (ref 0.44–1.00)
Chloride: 107 mmol/L (ref 101–111)
Glucose, Bld: 90 mg/dL (ref 65–99)
Potassium: 3 mmol/L — ABNORMAL LOW (ref 3.5–5.1)
SODIUM: 139 mmol/L (ref 135–145)

## 2016-10-08 LAB — HEPATIC FUNCTION PANEL
ALK PHOS: 145 U/L — AB (ref 38–126)
ALT: 34 U/L (ref 14–54)
AST: 63 U/L — ABNORMAL HIGH (ref 15–41)
Albumin: 2.5 g/dL — ABNORMAL LOW (ref 3.5–5.0)
BILIRUBIN DIRECT: 0.1 mg/dL (ref 0.1–0.5)
BILIRUBIN INDIRECT: 0.2 mg/dL — AB (ref 0.3–0.9)
BILIRUBIN TOTAL: 0.3 mg/dL (ref 0.3–1.2)
Total Protein: 5.5 g/dL — ABNORMAL LOW (ref 6.5–8.1)

## 2016-10-08 LAB — BRAIN NATRIURETIC PEPTIDE: B Natriuretic Peptide: 270 pg/mL — ABNORMAL HIGH (ref 0.0–100.0)

## 2016-10-08 LAB — ECHOCARDIOGRAM COMPLETE
HEIGHTINCHES: 63.5 in
Weight: 3760 oz

## 2016-10-08 LAB — PROTEIN / CREATININE RATIO, URINE
CREATININE, URINE: 15.8 mg/dL
Total Protein, Urine: <6 mg/dL

## 2016-10-08 LAB — I-STAT TROPONIN, ED: TROPONIN I, POC: 0.02 ng/mL (ref 0.00–0.08)

## 2016-10-08 MED ORDER — POTASSIUM CHLORIDE CRYS ER 20 MEQ PO TBCR
40.0000 meq | EXTENDED_RELEASE_TABLET | Freq: Once | ORAL | Status: DC
Start: 2016-10-08 — End: 2016-10-08

## 2016-10-08 MED ORDER — POTASSIUM CHLORIDE CRYS ER 20 MEQ PO TBCR: 20.0000 meq | EXTENDED_RELEASE_TABLET | Freq: Every day | ORAL | 0 refills | Status: DC

## 2016-10-08 MED ORDER — POTASSIUM CHLORIDE CRYS ER 20 MEQ PO TBCR
40.0000 meq | EXTENDED_RELEASE_TABLET | Freq: Once | ORAL | Status: AC
  Administered 2016-10-08: 40 meq via ORAL
  Filled 2016-10-08: qty 2

## 2016-10-08 MED ORDER — HYDROCHLOROTHIAZIDE 25 MG PO TABS: 25.0000 mg | ORAL_TABLET | Freq: Every day | ORAL | 0 refills | Status: DC

## 2016-10-08 MED ORDER — FUROSEMIDE 10 MG/ML IJ SOLN
40.0000 mg | Freq: Once | INTRAMUSCULAR | Status: AC
  Administered 2016-10-08: 40 mg via INTRAVENOUS
  Filled 2016-10-08: qty 4

## 2016-10-08 NOTE — ED Triage Notes (Signed)
PT c/o nasal congestion, sore throat, wheezing with body aches x4 days. PT states she [facility]05/16 [facility] [place]

## 2016-10-08 NOTE — ED Provider Notes (Signed)
[facility] DEPT Provider Note   CSN: [id number] Arrival date & time: 10/08/16  1041     History   Chief Complaint Chief Complaint  Patient presents with  . Cough    HPI Monica Cox is a 26 y.o. female.  HPI   26 year old female who recently gave birth on 10/05/16 presenting with cold symptoms. Patient states she has had a nonproductive cough for the past 2 weeks however since last night she has had nasal congestion, throat irritation, ear discomfort, generalized body aches, chills, and increase productive cough. She also report posttussive emesis with trace of blood. Sxs worsen when she lies flat.  Report increase SOB. Denies any specific treatment tried. She is currently not breast-feeding. No diarrhea or rash. No wheezing. No prior history of PE or DVT. She has a normal vaginal birth without complications. Hx of preeclampsia.   Past Medical History:  Diagnosis Date  . Anemia   . Asthma   . H/O trichomonas 08/08/2014   2015 pregnancy   . History of chlamydia 03/19/2016   2015 pregnancy   . Nausea 02/06/2016  . Reflux   . Smoker 02/06/2016  . Trichomonas infection 08-08-2014  . Vaginal Pap smear, abnormal   . Vertigo 09/21/2014   antivert rx 09/21/2014      Patient Active Problem List   Diagnosis Date Noted  . PROM (premature rupture of membranes) 10/04/2016  . Marijuana use 03/24/2016  . Depression with anxiety 03/19/2016  . Supervision of normal pregnancy 03/19/2016  . Hx of preeclampsia, prior pregnancy, currently pregnant 03/19/2016  . Smoker 02/06/2016  . Heart murmur 09/21/2014  . Rh negative state in antepartum period 05/01/2014    Past Surgical History:  Procedure Laterality Date  . DILATION AND CURETTAGE OF UTERUS     MAB Dr. Despina Hidden performed  . ESOPHAGOGASTRODUODENOSCOPY ENDOSCOPY      OB History    Gravida Para Term Preterm AB Living   4 3 3  0 1 3   SAB TAB Ectopic Multiple Live Births   1 0 0 0 3       Home Medications    Prior to  Admission medications   Medication Sig Start Date End Date Taking? Authorizing Provider  acetaminophen (TYLENOL) 325 MG tablet Take 2 tablets (650 mg total) by mouth every 4 (four) hours as needed (for pain scale < 4). 10/07/16   Lorne Skeens, MD  albuterol (PROVENTIL HFA;VENTOLIN HFA) 108 (90 Base) MCG/ACT inhaler Inhale 1-2 puffs into the lungs every 4 (four) hours as needed for wheezing or shortness of breath. 07/02/16   Cheral Marker, CNM  escitalopram (LEXAPRO) 10 MG tablet Take 10 mg by mouth at bedtime.    Historical Provider, MD  ibuprofen (ADVIL,MOTRIN) 600 MG tablet Take 1 tablet (600 mg total) by mouth every 6 (six) hours. 10/07/16   Lorne Skeens, MD  ondansetron (ZOFRAN) 4 MG tablet Take 1 tablet (4 mg total) by mouth every 4 (four) hours as needed for nausea. 10/07/16   Lorne Skeens, MD  oxycodone (OXY-IR) 5 MG capsule Take 1 capsule (5 mg total) by mouth every 4 (four) hours as needed. 10/07/16   Lorne Skeens, MD  promethazine (PHENERGAN) 25 MG tablet Take 12.5-25 mg by mouth every 6 (six) hours as needed for nausea or vomiting.    Historical Provider, MD    Family History Family History  Problem Relation Age of Onset  . Ulcers Father   . Hypertension Father   .  Hypertension Mother   . Ulcers Paternal Grandmother   . Diabetes Paternal Grandmother   . Ulcers Paternal Aunt   . Liver disease Neg Hx   . Colon cancer Neg Hx     Social History Social History  Substance Use Topics  . Smoking status: Former Smoker    Years: 3.00    Types: Cigars  . Smokeless tobacco: Never Used  . Alcohol use No     Comment: occasionally; not now     Allergies   Patient has no known allergies.   Review of Systems Review of Systems  All other systems reviewed and are negative.    Physical Exam Updated Vital Signs BP 143/72 (BP Location: Right Arm)   Pulse 99   Temp 98.5 F (36.9 C) (Oral)   Resp 18   Ht 5' 3.5" (1.613 m)   Wt 106.6 kg    LMP 12/31/2015 (Exact Date)   SpO2 98%   BMI 40.98 kg/m   Physical Exam  Constitutional: She is oriented to person, place, and time. She appears well-developed and well-nourished. No distress.  HENT:  Head: Atraumatic.  Right Ear: External ear normal.  Left Ear: External ear normal.  Mouth/Throat: Oropharynx is clear and moist.  Eyes: Conjunctivae are normal.  Neck: Normal range of motion. Neck supple.  No nuchal rigidity  Cardiovascular: Normal rate, regular rhythm and intact distal pulses.   Pulmonary/Chest: Effort normal. No respiratory distress. She has no wheezes.  Decreased breath sounds without obvious rales, rhonchi, or wheezes  Abdominal: Soft. Bowel sounds are normal. She exhibits no distension. There is no tenderness.  Musculoskeletal: She exhibits no edema.  Neurological: She is alert and oriented to person, place, and time.  Skin: No rash noted.  Psychiatric: She has a normal mood and affect.  Nursing note and vitals reviewed.    ED Treatments / Results  Labs (all labs ordered are listed, but only abnormal results are displayed) Labs Reviewed  BASIC METABOLIC PANEL - Abnormal; Notable for the following:       Result Value   Potassium 3.0 (*)    Calcium 8.2 (*)    All other components within normal limits  BRAIN NATRIURETIC PEPTIDE - Abnormal; Notable for the following:    B Natriuretic Peptide 270.0 (*)    All other components within normal limits  HEPATIC FUNCTION PANEL - Abnormal; Notable for the following:    Total Protein 5.5 (*)    Albumin 2.5 (*)    AST 63 (*)    Alkaline Phosphatase 145 (*)    Indirect Bilirubin 0.2 (*)    All other components within normal limits  PROTEIN / CREATININE RATIO, URINE  I-STAT TROPOININ, ED    EKG  EKG Interpretation  Date/Time:  Wednesday October 08 2016 11:59:24 EST Ventricular Rate:  63 PR Interval:    QRS Duration: 74 QT Interval:  414 QTC Calculation: 424 R Axis:   72 Text Interpretation:  Sinus  rhythm Baseline wander in lead(s) V6 Confirmed by Adriana Simas  MD, BRIAN ([id number]) on 10/08/2016 12:17:40 PM       Radiology Dg Chest 2 View  Result Date: 10/08/2016 CLINICAL DATA:  Chest tightness and shortness of breath. Recent childbirth. Labored breathing. EXAM: CHEST  2 VIEW COMPARISON:  01/03/2014 FINDINGS: There is pulmonary venous hypertension with interstitial edema and fluid in the fissures. No large effusion. No infiltrate or collapse. No bone abnormality. IMPRESSION: Pulmonary venous hypertension, interstitial edema and fluid in the fissures consistent with congestive heart  failure/ fluid overload. Electronically Signed   By: Paulina FusiMark  Shogry M.D.   On: 10/08/2016 11:28    Procedures Procedures (including critical care time)  Medications Ordered in ED Medications  furosemide (LASIX) injection 40 mg (40 mg Intravenous Given 10/08/16 1209)  potassium chloride SA (K-DUR,KLOR-CON) CR tablet 40 mEq (40 mEq Oral Given 10/08/16 1247)     Initial Impression / Assessment and Plan / ED Course  I have reviewed the triage vital signs and the nursing notes.  Pertinent labs & imaging results that were available during my care of the patient were reviewed by me and considered in my medical decision making (see chart for details).  Clinical Course     BP 150/84   Pulse 76   Temp 98.5 F (36.9 C) (Oral)   Resp 18   Ht 5' 3.5" (1.613 m)   Wt 106.6 kg   LMP 12/31/2015 (Exact Date)   SpO2 98%   BMI 40.98 kg/m    Final Clinical Impressions(s) / ED Diagnoses   Final diagnoses:  Shortness of breath    New Prescriptions New Prescriptions   HYDROCHLOROTHIAZIDE (HYDRODIURIL) 25 MG TABLET    Take 1 tablet (25 mg total) by mouth daily.   POTASSIUM CHLORIDE SA (K-DUR,KLOR-CON) 20 MEQ TABLET    Take 1 tablet (20 mEq total) by mouth daily.   11:45 AM This is a postpartum patient presenting with shortness of breath. She has some URI symptoms however she complaining of shortness of breath when she lays  flat and improved with she sits up. Her chest x-ray shows pulmonary vascular congestion concerning for fluid overload from heart failure. 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 I discussed this with Dr. Adriana Simasook. Plan to obtain EKG, troponin, BNP, basic labs, and made decided he obtain a d-dimer versus chest CT angiogram to rule out underlying PE causing her condition. Lasix given at 40 mg via IV.   12:48 PM   BNP elevated at 270.  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    Appreciate consultation from oncall OBGYN Dr. 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  He request a hepatic function panel to be obtained.  He will consult with hospitalist and will call me back with admission plan.  Pt is made aware of plan.    Pt is hypokalemic with K+ 3.0, potassium supplementation given.    1:53 PM Dr. Emelda FearFerguson has seen and evaluated pt.  He request echocardiogram to be perform in the ER for further evaluation.  WIll order echo.    4:19 PM Protein/Cr ratio unremarkable.  Echocardiogram unremarkable.  I discussed this finding with Dr. Emelda FearFerguson.  I even consider option of chest CTA to r/o PE.  However, given markedly improvement of symptoms after a dose of Lasix, Dr. Emelda FearFerguson recommend HCTZ 25mg  PO daily x 1 week and pt can f/u outpt for further care.  Pt voice understanding and agrees with plan.  I will also provide a short prescription of K+ for her hypokalemia.     Fayrene HelperBowie Mickelle Goupil, PA-C 10/08/16 1624    Donnetta HutchingBrian Cook, MD 10/11/16 [phone]

## 2016-10-08 NOTE — Discharge Instructions (Signed)
Please take potassium supplementation as well as fluid pill to help with your shortness of breath.  Call and follow up with OBGYN next week for further care.  Return to the ER if your condition worsen or if you have other concerns

## 2016-10-08 NOTE — Telephone Encounter (Signed)
Pt calling with chest tightness and short of breath, states she delivered on 12/31 and today she is having very labored breathing and chest tightness.  Pt states wants to come in here to avoid ED, advised pt to go to ED for evaluation.  Her breathing was very labored while on the phone.  Pt verbalized understanding.

## 2016-10-08 NOTE — Progress Notes (Signed)
*  PRELIMINARY RESULTS* Echocardiogram 2D Echocardiogram has been performed.  Jeryl Columbialliott, Raychelle Hudman 10/08/2016, 3:41 PM

## 2016-10-09 ENCOUNTER — Telehealth: Payer: Self-pay | Admitting: Obstetrics and Gynecology

## 2016-10-09 ENCOUNTER — Ambulatory Visit: Payer: Medicaid Other | Admitting: Obstetrics and Gynecology

## 2016-10-09 NOTE — Telephone Encounter (Signed)
Patient called stating to let you know she has only voided 2 times today and her feet and ankles are swollen again.  She is also still wheezing with no relief from her inhaler. Please advise.

## 2016-10-09 NOTE — Telephone Encounter (Signed)
Pt needs to be seen. She knows that. Please contact her.

## 2016-10-09 NOTE — Telephone Encounter (Signed)
Pt states that she recently was discharged from the hospital by Dr. Emelda FearFerguson with specific instruction to give us a call if she continues to have swelling. Pt states that she was prescribed a medication for the swelling but its not working and now she is not able to urinate please contact pt

## 2016-10-10 ENCOUNTER — Inpatient Hospital Stay (HOSPITAL_COMMUNITY): Payer: 59

## 2016-10-10 ENCOUNTER — Inpatient Hospital Stay (HOSPITAL_COMMUNITY)
Admission: AD | Admit: 2016-10-10 | Discharge: 2016-10-12 | Disposition: A | Discharge status: Home / Self Care | Payer: 59 | Source: Ambulatory Visit | Attending: Obstetrics & Gynecology | Admitting: Obstetrics & Gynecology

## 2016-10-10 ENCOUNTER — Encounter (HOSPITAL_COMMUNITY): Payer: Self-pay

## 2016-10-10 DIAGNOSIS — F53 Postpartum depression: Secondary | ICD-10-CM | POA: Diagnosis present

## 2016-10-10 DIAGNOSIS — R0602 Shortness of breath: Secondary | ICD-10-CM | POA: Diagnosis present

## 2016-10-10 DIAGNOSIS — Z87891 Personal history of nicotine dependence: Secondary | ICD-10-CM | POA: Diagnosis not present

## 2016-10-10 DIAGNOSIS — O99345 Other mental disorders complicating the puerperium: Secondary | ICD-10-CM

## 2016-10-10 DIAGNOSIS — O903 Peripartum cardiomyopathy: Secondary | ICD-10-CM

## 2016-10-10 DIAGNOSIS — O1495 Unspecified pre-eclampsia, complicating the puerperium: Secondary | ICD-10-CM | POA: Insufficient documentation

## 2016-10-10 LAB — URINALYSIS, ROUTINE W REFLEX MICROSCOPIC
Bacteria, UA: NONE SEEN
Bilirubin Urine: NEGATIVE
GLUCOSE, UA: NEGATIVE mg/dL
Ketones, ur: NEGATIVE mg/dL
NITRITE: NEGATIVE
PH: 7 (ref 5.0–8.0)
PROTEIN: 30 mg/dL — AB
Specific Gravity, Urine: 1.021 (ref 1.005–1.030)

## 2016-10-10 LAB — CBC WITH DIFFERENTIAL/PLATELET
Basophils Absolute: 0 10*3/uL (ref 0.0–0.1)
Basophils Relative: 0 %
Eosinophils Absolute: 0.1 10*3/uL (ref 0.0–0.7)
Eosinophils Relative: 2 %
HEMATOCRIT: 30.5 % — AB (ref 36.0–46.0)
HEMOGLOBIN: 10 g/dL — AB (ref 12.0–15.0)
LYMPHS ABS: 0.9 10*3/uL (ref 0.7–4.0)
LYMPHS PCT: 17 %
MCH: 27.2 pg (ref 26.0–34.0)
MCHC: 32.8 g/dL (ref 30.0–36.0)
MCV: 83.1 fL (ref 78.0–100.0)
MONO ABS: 0.1 10*3/uL (ref 0.1–1.0)
MONOS PCT: 3 %
NEUTROS ABS: 4.5 10*3/uL (ref 1.7–7.7)
Neutrophils Relative %: 78 %
Platelets: 324 10*3/uL (ref 150–400)
RBC: 3.67 MIL/uL — ABNORMAL LOW (ref 3.87–5.11)
RDW: 14.9 % (ref 11.5–15.5)
WBC: 5.7 10*3/uL (ref 4.0–10.5)

## 2016-10-10 LAB — COMPREHENSIVE METABOLIC PANEL
ALK PHOS: 170 U/L — AB (ref 38–126)
ALT: 55 U/L — ABNORMAL HIGH (ref 14–54)
AST: 60 U/L — ABNORMAL HIGH (ref 15–41)
Albumin: 3 g/dL — ABNORMAL LOW (ref 3.5–5.0)
Anion gap: 8 (ref 5–15)
BILIRUBIN TOTAL: 0.6 mg/dL (ref 0.3–1.2)
BUN: 12 mg/dL (ref 6–20)
CALCIUM: 8.7 mg/dL — AB (ref 8.9–10.3)
CO2: 26 mmol/L (ref 22–32)
Chloride: 104 mmol/L (ref 101–111)
Creatinine, Ser: 0.7 mg/dL (ref 0.44–1.00)
GFR calc Af Amer: >60 mL/min (ref >60)
Glucose, Bld: 82 mg/dL (ref 65–99)
Potassium: 3.8 mmol/L (ref 3.5–5.1)
Sodium: 138 mmol/L (ref 135–145)
TOTAL PROTEIN: 6.4 g/dL — AB (ref 6.5–8.1)

## 2016-10-10 LAB — TROPONIN I

## 2016-10-10 LAB — PROTEIN / CREATININE RATIO, URINE
CREATININE, URINE: 185 mg/dL
Protein Creatinine Ratio: 0.13 mg/mg{Cre} (ref 0.00–0.15)
Total Protein, Urine: 24 mg/dL

## 2016-10-10 LAB — D-DIMER, QUANTITATIVE: D-Dimer, Quant: 3.1 ug/mL-FEU — ABNORMAL HIGH (ref 0.00–0.50)

## 2016-10-10 MED ORDER — FUROSEMIDE 10 MG/ML IJ SOLN
20.0000 mg | Freq: Four times a day (QID) | INTRAMUSCULAR | Status: DC
  Administered 2016-10-11 (×4): 20 mg via INTRAVENOUS
  Filled 2016-10-10 (×6): qty 2

## 2016-10-10 MED ORDER — FUROSEMIDE 10 MG/ML IJ SOLN
40.0000 mg | Freq: Once | INTRAMUSCULAR | Status: AC
  Administered 2016-10-10: 40 mg via INTRAVENOUS
  Filled 2016-10-10: qty 4

## 2016-10-10 MED ORDER — PANTOPRAZOLE SODIUM 40 MG PO TBEC
40.0000 mg | DELAYED_RELEASE_TABLET | Freq: Every day | ORAL | Status: DC
  Administered 2016-10-11: 40 mg via ORAL
  Filled 2016-10-10: qty 1

## 2016-10-10 MED ORDER — LABETALOL HCL 5 MG/ML IV SOLN: 20.0000 mg | INTRAVENOUS | Status: DC | PRN

## 2016-10-10 MED ORDER — CALCIUM CARBONATE ANTACID 500 MG PO CHEW: 2.0000 | CHEWABLE_TABLET | Freq: Two times a day (BID) | ORAL | Status: DC | PRN

## 2016-10-10 MED ORDER — ESCITALOPRAM OXALATE 10 MG PO TABS
10.0000 mg | ORAL_TABLET | Freq: Every day | ORAL | Status: DC
  Administered 2016-10-11 (×2): 10 mg via ORAL
  Filled 2016-10-10 (×3): qty 1

## 2016-10-10 MED ORDER — POTASSIUM CHLORIDE CRYS ER 20 MEQ PO TBCR
20.0000 meq | EXTENDED_RELEASE_TABLET | Freq: Two times a day (BID) | ORAL | Status: DC
  Administered 2016-10-11 (×3): 20 meq via ORAL
  Filled 2016-10-10 (×6): qty 1

## 2016-10-10 MED ORDER — SENNOSIDES-DOCUSATE SODIUM 8.6-50 MG PO TABS
1.0000 | ORAL_TABLET | Freq: Every day | ORAL | Status: DC
  Administered 2016-10-11 (×2): 1 via ORAL
  Filled 2016-10-10 (×2): qty 1

## 2016-10-10 MED ORDER — LACTATED RINGERS IV SOLN
INTRAVENOUS | Status: DC
  Administered 2016-10-10: 21:00:00 via INTRAVENOUS

## 2016-10-10 MED ORDER — LACTATED RINGERS IV SOLN: INTRAVENOUS | Status: DC

## 2016-10-10 MED ORDER — ACETAMINOPHEN 325 MG PO TABS: 650.0000 mg | ORAL_TABLET | ORAL | Status: DC | PRN

## 2016-10-10 MED ORDER — MAGNESIUM SULFATE 50 % IJ SOLN
2.0000 g/h | INTRAVENOUS | Status: AC
  Administered 2016-10-11: 2 g/h via INTRAVENOUS
  Filled 2016-10-10 (×2): qty 80

## 2016-10-10 MED ORDER — HYDRALAZINE HCL 20 MG/ML IJ SOLN: 5.0000 mg | INTRAMUSCULAR | Status: DC | PRN

## 2016-10-10 MED ORDER — IBUPROFEN 600 MG PO TABS
600.0000 mg | ORAL_TABLET | Freq: Four times a day (QID) | ORAL | Status: DC
  Administered 2016-10-11 – 2016-10-12 (×7): 600 mg via ORAL
  Filled 2016-10-10 (×6): qty 1

## 2016-10-10 MED ORDER — ALBUTEROL SULFATE (2.5 MG/3ML) 0.083% IN NEBU: 3.0000 mL | INHALATION_SOLUTION | RESPIRATORY_TRACT | Status: DC | PRN

## 2016-10-10 MED ORDER — MAGNESIUM SULFATE BOLUS VIA INFUSION
4.0000 g | Freq: Once | INTRAVENOUS | Status: AC
  Administered 2016-10-10: 4 g via INTRAVENOUS
  Filled 2016-10-10: qty 500

## 2016-10-10 NOTE — Telephone Encounter (Signed)
Left message on VM for patient to be seen. I advised patient that if she needed to be seen sooner than her appointment then she needed to go to the ER.

## 2016-10-10 NOTE — MAU Note (Signed)
SVD 10/05/16. Went home 10/07/16. Went to [facility] due to chest feeling heavy and SOB. HAd chest xray and showed had fld in lungs and around heart. Sent home with Lasix. Last took Lasix yest. Has urinated once today. Feeling pressure in chest and hard to breathe

## 2016-10-10 NOTE — MAU Provider Note (Signed)
History     CSN: [id number]  Arrival date and time: 10/10/16 1941   First Provider Initiated Contact with Patient 10/10/16 2214      Chief Complaint  Patient presents with  . Leg Swelling   HPI: Monica Cox is a 26 y.o. year old G4P3013 female at 5 days status post spontaneous vaginal delivery on 10/05/2016 who presents to MAU reporting worsening SOB. She was seen at [facility] 10/08/2016 for shortness of breath and a nonproductive cough and was diagnosed with fluid overload which was treated with Lasix. Patient had a dramatic improvement in symptoms and was discharged home on HCTZ. Shortness of breath and cough along with significant bilateral pedal edema return the following day and have worsened since then. Patient also reports that her ribs are very sore (pointing to upper abdomen) which she attributes to coughing.   Patient has a history of postpartum preeclampsia after G2. Had normal blood pressures this pregnancy. Blood pressures were mildly elevated at [facility] on 10/08/2016. Preeclampsia labs were normal except for mildly elevated AST. Patient has not received formal diagnosis of preeclampsia or magnesium sulfate with this pregnancy.  Shortness of breath Associated symptoms: Positive for chest pressure, decreased voiding, nonproductive cough, bilateral lower extremity edema. Negative for fever, chills, wheezing, sputum production, congestion, sore throat, malaise, myalgias, hemoptysis, calf pain.     OB History    Gravida Para Term Preterm AB Living   4 3 3  0 1 3   SAB TAB Ectopic Multiple Live Births   1 0 0 0 3      Past Medical History:  Diagnosis Date  . Anemia   . Asthma   . H/O trichomonas 08/08/2014   2015 pregnancy   . History of chlamydia 03/19/2016   2015 pregnancy   . Nausea 02/06/2016  . Reflux   . Smoker 02/06/2016  . Trichomonas infection 08-08-2014  . Vaginal Pap smear, abnormal   . Vertigo 09/21/2014   antivert rx 09/21/2014       Past Surgical History:  Procedure Laterality Date  . DILATION AND CURETTAGE OF UTERUS     MAB Dr. Despina Hidden performed  . ESOPHAGOGASTRODUODENOSCOPY ENDOSCOPY      Family History  Problem Relation Age of Onset  . Ulcers Father   . Hypertension Father   . Hypertension Mother   . Ulcers Paternal Grandmother   . Diabetes Paternal Grandmother   . Ulcers Paternal Aunt   . Liver disease Neg Hx   . Colon cancer Neg Hx     Social History  Substance Use Topics  . Smoking status: Former Smoker    Years: 3.00    Types: Cigars  . Smokeless tobacco: Never Used  . Alcohol use No     Comment: occasionally; not now    Allergies: No Known Allergies  Prescriptions Prior to Admission  Medication Sig Dispense Refill Last Dose  . acetaminophen (TYLENOL) 325 MG tablet Take 2 tablets (650 mg total) by mouth every 4 (four) hours as needed (for pain scale < 4). 30 tablet 0 Past Month at Unknown time  . albuterol (PROVENTIL HFA;VENTOLIN HFA) 108 (90 Base) MCG/ACT inhaler Inhale 1-2 puffs into the lungs every 4 (four) hours as needed for wheezing or shortness of breath. 1 Inhaler 0 10/09/2016 at Unknown time  . escitalopram (LEXAPRO) 10 MG tablet Take 10 mg by mouth at bedtime.   Past Week at Unknown time  . hydrochlorothiazide (HYDRODIURIL) 25 MG tablet Take 1 tablet (25  mg total) by mouth daily. 7 tablet 0 10/09/2016 at Unknown time  . ibuprofen (ADVIL,MOTRIN) 600 MG tablet Take 1 tablet (600 mg total) by mouth every 6 (six) hours. 30 tablet 0 10/09/2016 at Unknown time  . ondansetron (ZOFRAN) 4 MG tablet Take 1 tablet (4 mg total) by mouth every 4 (four) hours as needed for nausea. 20 tablet 0 10/09/2016 at Unknown time  . oxycodone (OXY-IR) 5 MG capsule Take 1 capsule (5 mg total) by mouth every 4 (four) hours as needed. (Patient taking differently: Take 5 mg by mouth every 4 (four) hours as needed for pain. ) 5 capsule 0 Past Week at Unknown time  . potassium chloride SA (K-DUR,KLOR-CON) 20 MEQ tablet Take  1 tablet (20 mEq total) by mouth daily. 3 tablet 0 10/09/2016 at Unknown time  . promethazine (PHENERGAN) 25 MG tablet Take 12.5-25 mg by mouth every 6 (six) hours as needed for nausea or vomiting.   Past Week at Unknown time    Review of Systems  Constitutional: Negative for chills and fever.  HENT: Negative for congestion, rhinorrhea and sore throat.   Eyes: Negative for visual disturbance.  Respiratory: Positive for cough and shortness of breath (when lying down). Negative for chest tightness and wheezing.   Cardiovascular: Positive for leg swelling (bilateral). Negative for chest pain and palpitations.       Positive for chest pressure when lying down  Gastrointestinal: Negative for abdominal pain, nausea and vomiting.  Genitourinary: Positive for difficulty urinating (Only voided twice since discharge [facility]) and vaginal bleeding (Scant lochia). Negative for dysuria, flank pain, frequency, hematuria and urgency.  Neurological: Negative for headaches.   Physical Exam   Blood pressure 121/63, pulse 79, temperature 98.3 F (36.8 C), resp. rate 20, height 5' 3.5" (1.613 m), weight 220 lb 12.8 oz (100.2 kg), SpO2 100 %, not currently breastfeeding.  Patient Vitals for the past 24 hrs:  BP Temp Pulse Resp SpO2 Height Weight  10/10/16 2235 - - - - 97 % - -  10/10/16 2232 111/60 - (!) 58 - 100 % - -  10/10/16 2220 134/63 - 60 - 100 % - -  10/10/16 2014 121/63 - 79 - 100 % - -  10/10/16 1953 - - - - 99 % - -  10/10/16 1952 - - - - 100 % - -  10/10/16 1948 147/79 98.3 F (36.8 C) 72 20 100 % 5' 3.5" (1.613 m) 220 lb 12.8 oz (100.2 kg)     Physical Exam  Constitutional: She is oriented to person, place, and time. She appears well-developed and well-nourished. No distress.  HENT:  Head: Normocephalic.  Eyes: Conjunctivae are normal. No scleral icterus.  Cardiovascular: Normal rate, regular rhythm and normal heart sounds.   Respiratory: Effort normal. No accessory muscle  usage. No respiratory distress. She has decreased breath sounds in the right lower field and the left lower field. She has no wheezes. She has no rales.  Musculoskeletal: She exhibits edema (3+ pitting pedal edema bilaterally). She exhibits no tenderness.  Neurological: She is alert and oriented to person, place, and time. She has normal reflexes. She displays normal reflexes.  Skin: Skin is warm and dry. She is not diaphoretic.  Psychiatric: She has a normal mood and affect.   Results for orders placed or performed during the hospital encounter of 10/10/16 (from the past 24 hour(s))  Urinalysis, Routine w reflex microscopic     Status: Abnormal   Collection Time: 10/10/16  8:04 PM  Result Value Ref Range   Color, Urine YELLOW YELLOW   APPearance HAZY (A) CLEAR   Specific Gravity, Urine 1.021 1.005 - 1.030   pH 7.0 5.0 - 8.0   Glucose, UA NEGATIVE NEGATIVE mg/dL   Hgb urine dipstick MODERATE (A) NEGATIVE   Bilirubin Urine NEGATIVE NEGATIVE   Ketones, ur NEGATIVE NEGATIVE mg/dL   Protein, ur 30 (A) NEGATIVE mg/dL   Nitrite NEGATIVE NEGATIVE   Leukocytes, UA MODERATE (A) NEGATIVE   RBC / HPF 6-30 0 - 5 RBC/hpf   WBC, UA 6-30 0 - 5 WBC/hpf   Bacteria, UA NONE SEEN NONE SEEN   Squamous Epithelial / LPF 6-30 (A) NONE SEEN   Mucous PRESENT   Protein / creatinine ratio, urine     Status: None   Collection Time: 10/10/16  8:07 PM  Result Value Ref Range   Creatinine, Urine 185.00 mg/dL   Total Protein, Urine 24 mg/dL   Protein Creatinine Ratio 0.13 0.00 - 0.15 mg/mg[Cre]  CBC with Differential/Platelet     Status: Abnormal   Collection Time: 10/10/16  8:59 PM  Result Value Ref Range   WBC 5.7 4.0 - 10.5 K/uL   RBC 3.67 (L) 3.87 - 5.11 MIL/uL   Hemoglobin 10.0 (L) 12.0 - 15.0 g/dL   HCT 08.6 (L) 57.8 - 46.9 %   MCV 83.1 78.0 - 100.0 fL   MCH 27.2 26.0 - 34.0 pg   MCHC 32.8 30.0 - 36.0 g/dL   RDW 62.9 52.8 - 41.3 %   Platelets 324 150 - 400 K/uL   Neutrophils Relative % 78 %    Neutro Abs 4.5 1.7 - 7.7 K/uL   Lymphocytes Relative 17 %   Lymphs Abs 0.9 0.7 - 4.0 K/uL   Monocytes Relative 3 %   Monocytes Absolute 0.1 0.1 - 1.0 K/uL   Eosinophils Relative 2 %   Eosinophils Absolute 0.1 0.0 - 0.7 K/uL   Basophils Relative 0 %   Basophils Absolute 0.0 0.0 - 0.1 K/uL  Comprehensive metabolic panel     Status: Abnormal   Collection Time: 10/10/16  8:59 PM  Result Value Ref Range   Sodium 138 135 - 145 mmol/L   Potassium 3.8 3.5 - 5.1 mmol/L   Chloride 104 101 - 111 mmol/L   CO2 26 22 - 32 mmol/L   Glucose, Bld 82 65 - 99 mg/dL   BUN 12 6 - 20 mg/dL   Creatinine, Ser 2.44 0.44 - 1.00 mg/dL   Calcium 8.7 (L) 8.9 - 10.3 mg/dL   Total Protein 6.4 (L) 6.5 - 8.1 g/dL   Albumin 3.0 (L) 3.5 - 5.0 g/dL   AST 60 (H) 15 - 41 U/L   ALT 55 (H) 14 - 54 U/L   Alkaline Phosphatase 170 (H) 38 - 126 U/L   Total Bilirubin 0.6 0.3 - 1.2 mg/dL   GFR calc non Af Amer >60 >60 mL/min   GFR calc Af Amer >60 >60 mL/min   Anion gap 8 5 - 15  Troponin I     Status: None   Collection Time: 10/10/16  8:59 PM  Result Value Ref Range   Troponin I <0.03 <0.03 ng/mL  D-dimer, quantitative (not at [facility])     Status: Abnormal   Collection Time: 10/10/16  8:59 PM  Result Value Ref Range   D-Dimer, Quant 3.10 (H) 0.00 - 0.50 ug/mL-FEU   Dg Chest 2 View  Result Date: 10/10/2016 CLINICAL DATA:  Shortness of  breath EXAM: CHEST  2 VIEW COMPARISON:  10/08/2016 FINDINGS: Tiny bilateral effusions. Mild cardiomegaly with decreased bibasilar interstitial edema. No pneumothorax. No interval consolidation. IMPRESSION: Tiny bilateral effusions. Cardiomegaly with decreased interstitial edema compared to prior Electronically Signed   By: Jasmine Pang M.D.   On: 10/10/2016 21:51     MAU Course  Procedures Orders Placed This Encounter  Procedures  . DG Chest 2 View  . Urinalysis, Routine w reflex microscopic  . CBC with Differential/Platelet  . Comprehensive metabolic panel  . Troponin I  .  D-dimer, quantitative (not at [facility])  . Protein / creatinine ratio, urine  . Intake and output  . Vital signs  . Insert peripheral IV   Meds ordered this encounter  Medications  . lactated ringers infusion--KVO  . furosemide (LASIX) injection 40 mg   Mild improvement 2 hours after Lasix dose. His voided over 2 L. Oxygen saturations 100% on room air and patient able to speak in complete sentences when lying flat although she still reports shortness of breath.  MDM - History, exam, labs most suggestive of recurrent fluid overload. Patient is again responding well to treatment with Lasix.   - Low suspicion for PE despite elevated d-dimer (likely from post partum state). PERC rule shows low probability of PE.   - Low suspicion for infectious process due to absence of fever, leukocytosis, accompanying respiratory complaints and patient's good response to Lasix therapy.  - Low suspicion for cardiomyopathy of pregnancy due to essentially normal echocardiogram ~48 hours ago - Concern for preeclampsia due to elevated blood pressures at [facility] a women[facility] hospital tonight, elevated liver enzymes and complaint of upper abdominal pain. Will admit for 24 hours of mag sulfate.  Assessment and Plan  Preeclampsia Fluid overload  Plan Admit to third floor per consult with Dr. Emelda Fear. Magnesium sulfate 24 hours. Repeat preeclampsia labs in the morning. Maintain IV and lowest possible rate I&Os lasix 20 q 6 x 4  Virginia Smith 10/10/2016, 10:18 PM

## 2016-10-10 NOTE — H&P (Signed)
History     CSN: [id number]  Arrival date and time: 10/10/16 1941   First Provider Initiated Contact with Patient 10/10/16 2214         Chief Complaint  Patient presents with  . Leg Swelling   HPI: Monica Cox is a 26 y.o. year old [id number] female at 5 days status post spontaneous vaginal delivery on 10/05/2016 who presents to MAU reporting worsening SOB. She was [facility] 10/08/2016 for shortness of breath and a nonproductive cough and was diagnosed with fluid overload which was treated with Lasix. Patient had a dramatic improvement in symptoms and was discharged home on HCTZ. Shortness of breath and cough along with significant bilateral pedal edema return the following day and have worsened since then. Patient also reports that her ribs are very sore (pointing to upper abdomen) which she attributes to coughing.   Patient has a history of postpartum preeclampsia after G2. Had normal blood pressures this pregnancy. Blood pressures were mildly elevated at [facility] on 10/08/2016. Preeclampsia labs were normal except for mildly elevated AST. Patient has not received formal diagnosis of preeclampsia or magnesium sulfate with this pregnancy.  Shortness of breath Associated symptoms: Positive for chest pressure, decreased voiding, nonproductive cough, bilateral lower extremity edema. Negative for fever, chills, wheezing, sputum production, congestion, sore throat, malaise, myalgias, hemoptysis, calf pain.            OB History    Gravida Para Term Preterm AB Living   4 3 3  0 1 3   SAB TAB Ectopic Multiple Live Births   1 0 0 0 3          Past Medical History:  Diagnosis Date  . Anemia   . Asthma   . H/O trichomonas 08/08/2014   2015 pregnancy   . History of chlamydia 03/19/2016   2015 pregnancy   . Nausea 02/06/2016  . Reflux   . Smoker 02/06/2016  . Trichomonas infection 08-08-2014  . Vaginal Pap smear, abnormal   . Vertigo  09/21/2014   antivert rx 09/21/2014           Past Surgical History:  Procedure Laterality Date  . DILATION AND CURETTAGE OF UTERUS     MAB Dr. Despina HiddenEure performed  . ESOPHAGOGASTRODUODENOSCOPY ENDOSCOPY           Family History  Problem Relation Age of Onset  . Ulcers Father   . Hypertension Father   . Hypertension Mother   . Ulcers Paternal Grandmother   . Diabetes Paternal Grandmother   . Ulcers Paternal Aunt   . Liver disease Neg Hx   . Colon cancer Neg Hx           Social History  Substance Use Topics  . Smoking status: Former Smoker    Years: 3.00    Types: Cigars  . Smokeless tobacco: Never Used  . Alcohol use No     Comment: occasionally; not now    Allergies: No Known Allergies         Prescriptions Prior to Admission  Medication Sig Dispense Refill Last Dose  . acetaminophen (TYLENOL) 325 MG tablet Take 2 tablets (650 mg total) by mouth every 4 (four) hours as needed (for pain scale < 4). 30 tablet 0 Past Month at Unknown time  . albuterol (PROVENTIL HFA;VENTOLIN HFA) 108 (90 Base) MCG/ACT inhaler Inhale 1-2 puffs into the lungs every 4 (four) hours as needed for wheezing or shortness of breath. 1 Inhaler  0 10/09/2016 at Unknown time  . escitalopram (LEXAPRO) 10 MG tablet Take 10 mg by mouth at bedtime.   Past Week at Unknown time  . hydrochlorothiazide (HYDRODIURIL) 25 MG tablet Take 1 tablet (25 mg total) by mouth daily. 7 tablet 0 10/09/2016 at Unknown time  . ibuprofen (ADVIL,MOTRIN) 600 MG tablet Take 1 tablet (600 mg total) by mouth every 6 (six) hours. 30 tablet 0 10/09/2016 at Unknown time  . ondansetron (ZOFRAN) 4 MG tablet Take 1 tablet (4 mg total) by mouth every 4 (four) hours as needed for nausea. 20 tablet 0 10/09/2016 at Unknown time  . oxycodone (OXY-IR) 5 MG capsule Take 1 capsule (5 mg total) by mouth every 4 (four) hours as needed. (Patient taking differently: Take 5 mg by mouth every 4 (four) hours as needed for pain. ) 5  capsule 0 Past Week at Unknown time  . potassium chloride SA (K-DUR,KLOR-CON) 20 MEQ tablet Take 1 tablet (20 mEq total) by mouth daily. 3 tablet 0 10/09/2016 at Unknown time  . promethazine (PHENERGAN) 25 MG tablet Take 12.5-25 mg by mouth every 6 (six) hours as needed for nausea or vomiting.   Past Week at Unknown time    Review of Systems  Constitutional: Negative for chills and fever.  HENT: Negative for congestion, rhinorrhea and sore throat.   Eyes: Negative for visual disturbance.  Respiratory: Positive for cough and shortness of breath (when lying down). Negative for chest tightness and wheezing.   Cardiovascular: Positive for leg swelling (bilateral). Negative for chest pain and palpitations.       Positive for chest pressure when lying down  Gastrointestinal: Negative for abdominal pain, nausea and vomiting.  Genitourinary: Positive for difficulty urinating (Only voided twice since discharge from [facility]) and vaginal bleeding (Scant lochia). Negative for dysuria, flank pain, frequency, hematuria and urgency.  Neurological: Negative for headaches.   Physical Exam   Blood pressure 121/63, pulse 79, temperature 98.3 F (36.8 C), resp. rate 20, height 5' 3.5" (1.613 m), weight 220 lb 12.8 oz (100.2 kg), SpO2 100 %, not currently breastfeeding.  Patient Vitals for the past 24 hrs:  BP Temp Pulse Resp SpO2 Height Weight  10/10/16 2235 - - - - 97 % - -  10/10/16 2232 111/60 - (!) 58 - 100 % - -  10/10/16 2220 134/63 - 60 - 100 % - -  10/10/16 2014 121/63 - 79 - 100 % - -  10/10/16 1953 - - - - 99 % - -  10/10/16 1952 - - - - 100 % - -  10/10/16 1948 147/79 98.3 F (36.8 C) 72 20 100 % 5' 3.5" (1.613 m) 220 lb 12.8 oz (100.2 kg)     Physical Exam  Constitutional: She is oriented to person, place, and time. She appears well-developed and well-nourished. No distress.  HENT:  Head: Normocephalic.  Eyes: Conjunctivae are normal. No scleral icterus.   Cardiovascular: Normal rate, regular rhythm and normal heart sounds.   Respiratory: Effort normal. No accessory muscle usage. No respiratory distress. She has decreased breath sounds in the right lower field and the left lower field. She has no wheezes. She has no rales.  Musculoskeletal: She exhibits edema (3+ pitting pedal edema bilaterally). She exhibits no tenderness.  Neurological: She is alert and oriented to person, place, and time. She has normal reflexes. She displays normal reflexes.  Skin: Skin is warm and dry. She is not diaphoretic.  Psychiatric: She has a normal mood and  affect.   Lab Results Last 24 Hours       Results for orders placed or performed during the hospital encounter of 10/10/16 (from the past 24 hour(s))  Urinalysis, Routine w reflex microscopic     Status: Abnormal   Collection Time: 10/10/16  8:04 PM  Result Value Ref Range   Color, Urine YELLOW YELLOW   APPearance HAZY (A) CLEAR   Specific Gravity, Urine 1.021 1.005 - 1.030   pH 7.0 5.0 - 8.0   Glucose, UA NEGATIVE NEGATIVE mg/dL   Hgb urine dipstick MODERATE (A) NEGATIVE   Bilirubin Urine NEGATIVE NEGATIVE   Ketones, ur NEGATIVE NEGATIVE mg/dL   Protein, ur 30 (A) NEGATIVE mg/dL   Nitrite NEGATIVE NEGATIVE   Leukocytes, UA MODERATE (A) NEGATIVE   RBC / HPF 6-30 0 - 5 RBC/hpf   WBC, UA 6-30 0 - 5 WBC/hpf   Bacteria, UA NONE SEEN NONE SEEN   Squamous Epithelial / LPF 6-30 (A) NONE SEEN   Mucous PRESENT   Protein / creatinine ratio, urine     Status: None   Collection Time: 10/10/16  8:07 PM  Result Value Ref Range   Creatinine, Urine 185.00 mg/dL   Total Protein, Urine 24 mg/dL   Protein Creatinine Ratio 0.13 0.00 - 0.15 mg/mg[Cre]  CBC with Differential/Platelet     Status: Abnormal   Collection Time: 10/10/16  8:59 PM  Result Value Ref Range   WBC 5.7 4.0 - 10.5 K/uL   RBC 3.67 (L) 3.87 - 5.11 MIL/uL   Hemoglobin 10.0 (L) 12.0 - 15.0 g/dL   HCT 16.1 (L) 09.6 - 04.5  %   MCV 83.1 78.0 - 100.0 fL   MCH 27.2 26.0 - 34.0 pg   MCHC 32.8 30.0 - 36.0 g/dL   RDW 40.9 81.1 - 91.4 %   Platelets 324 150 - 400 K/uL   Neutrophils Relative % 78 %   Neutro Abs 4.5 1.7 - 7.7 K/uL   Lymphocytes Relative 17 %   Lymphs Abs 0.9 0.7 - 4.0 K/uL   Monocytes Relative 3 %   Monocytes Absolute 0.1 0.1 - 1.0 K/uL   Eosinophils Relative 2 %   Eosinophils Absolute 0.1 0.0 - 0.7 K/uL   Basophils Relative 0 %   Basophils Absolute 0.0 0.0 - 0.1 K/uL  Comprehensive metabolic panel     Status: Abnormal   Collection Time: 10/10/16  8:59 PM  Result Value Ref Range   Sodium 138 135 - 145 mmol/L   Potassium 3.8 3.5 - 5.1 mmol/L   Chloride 104 101 - 111 mmol/L   CO2 26 22 - 32 mmol/L   Glucose, Bld 82 65 - 99 mg/dL   BUN 12 6 - 20 mg/dL   Creatinine, Ser 7.82 0.44 - 1.00 mg/dL   Calcium 8.7 (L) 8.9 - 10.3 mg/dL   Total Protein 6.4 (L) 6.5 - 8.1 g/dL   Albumin 3.0 (L) 3.5 - 5.0 g/dL   AST 60 (H) 15 - 41 U/L   ALT 55 (H) 14 - 54 U/L   Alkaline Phosphatase 170 (H) 38 - 126 U/L   Total Bilirubin 0.6 0.3 - 1.2 mg/dL   GFR calc non Af Amer >60 >60 mL/min   GFR calc Af Amer >60 >60 mL/min   Anion gap 8 5 - 15  Troponin I     Status: None   Collection Time: 10/10/16  8:59 PM  Result Value Ref Range   Troponin I <0.03 <0.03 ng/mL  D-dimer, quantitative (not at [facility])     Status: Abnormal   Collection Time: 10/10/16  8:59 PM  Result Value Ref Range   D-Dimer, Quant 3.10 (H) 0.00 - 0.50 ug/mL-FEU     Dg Chest 2 View  Result Date: 10/10/2016 CLINICAL DATA:  Shortness of breath EXAM: CHEST  2 VIEW COMPARISON:  10/08/2016 FINDINGS: Tiny bilateral effusions. Mild cardiomegaly with decreased bibasilar interstitial edema. No pneumothorax. No interval consolidation. IMPRESSION: Tiny bilateral effusions. Cardiomegaly with decreased interstitial edema compared to prior Electronically Signed   By: Jasmine Pang M.D.   On: 10/10/2016 21:51     MAU  Course  Procedures    Orders Placed This Encounter  Procedures  . DG Chest 2 View  . Urinalysis, Routine w reflex microscopic  . CBC with Differential/Platelet  . Comprehensive metabolic panel  . Troponin I  . D-dimer, quantitative (not at [facility])  . Protein / creatinine ratio, urine  . Intake and output  . Vital signs  . Insert peripheral IV   Meds ordered this encounter  Medications  . lactated ringers infusion--KVO  . furosemide (LASIX) injection 40 mg   Mild improvement 2 hours after Lasix dose. His voided over 2 L. Oxygen saturations 100% on room air and patient able to speak in complete sentences when lying flat although she still reports shortness of breath.  MDM - History, exam, labs most suggestive of recurrent fluid overload. Patient is again responding well to treatment with Lasix.              - Low suspicion for PE despite elevated d-dimer (likely from post partum state). PERC rule shows low probability of PE.              - Low suspicion for infectious process due to absence of fever, leukocytosis, accompanying respiratory complaints and patient's good response to Lasix therapy.             - Low suspicion for cardiomyopathy of pregnancy due to essentially normal echocardiogram ~48 hours ago - Concern for preeclampsia due to elevated blood pressures at [facility] in a women's hospital tonight, elevated liver enzymes and complaint of upper abdominal pain. Will admit for 24 hours of mag sulfate.  Assessment and Plan  Preeclampsia Fluid overload  Plan Admit to third floor per consult with Dr. Emelda Fear. Magnesium sulfate 24 hours. Repeat preeclampsia labs in the morning. Maintain IV and lowest possible rate I&Os  Dorathy Kinsman 10/10/2016, 10:18 PM

## 2016-10-11 ENCOUNTER — Encounter (HOSPITAL_COMMUNITY): Payer: Self-pay

## 2016-10-11 DIAGNOSIS — O1495 Unspecified pre-eclampsia, complicating the puerperium: Secondary | ICD-10-CM | POA: Diagnosis not present

## 2016-10-11 DIAGNOSIS — F53 Postpartum depression: Secondary | ICD-10-CM | POA: Diagnosis present

## 2016-10-11 DIAGNOSIS — O99345 Other mental disorders complicating the puerperium: Secondary | ICD-10-CM

## 2016-10-11 LAB — COMPREHENSIVE METABOLIC PANEL
ALT: 50 U/L (ref 14–54)
AST: 51 U/L — ABNORMAL HIGH (ref 15–41)
Albumin: 2.7 g/dL — ABNORMAL LOW (ref 3.5–5.0)
Alkaline Phosphatase: 145 U/L — ABNORMAL HIGH (ref 38–126)
Anion gap: 10 (ref 5–15)
BUN: 12 mg/dL (ref 6–20)
CHLORIDE: 104 mmol/L (ref 101–111)
CO2: 26 mmol/L (ref 22–32)
CREATININE: 0.66 mg/dL (ref 0.44–1.00)
Calcium: 7.9 mg/dL — ABNORMAL LOW (ref 8.9–10.3)
Glucose, Bld: 90 mg/dL (ref 65–99)
POTASSIUM: 3.2 mmol/L — AB (ref 3.5–5.1)
Sodium: 140 mmol/L (ref 135–145)
Total Bilirubin: 0.4 mg/dL (ref 0.3–1.2)
Total Protein: 5.6 g/dL — ABNORMAL LOW (ref 6.5–8.1)

## 2016-10-11 LAB — MAGNESIUM: Magnesium: 4.7 mg/dL — ABNORMAL HIGH (ref 1.7–2.4)

## 2016-10-11 LAB — BRAIN NATRIURETIC PEPTIDE: B Natriuretic Peptide: 307.9 pg/mL — ABNORMAL HIGH (ref 0.0–100.0)

## 2016-10-11 MED ORDER — POTASSIUM CHLORIDE CRYS ER 20 MEQ PO TBCR
20.0000 meq | EXTENDED_RELEASE_TABLET | Freq: Once | ORAL | Status: AC
Start: 2016-10-11 — End: 2016-10-11
  Administered 2016-10-11: 20 meq via ORAL
  Filled 2016-10-11: qty 1

## 2016-10-11 MED ORDER — FUROSEMIDE 20 MG PO TABS
20.0000 mg | ORAL_TABLET | Freq: Four times a day (QID) | ORAL | Status: AC
  Administered 2016-10-11 – 2016-10-12 (×2): 20 mg via ORAL
  Filled 2016-10-11 (×2): qty 1

## 2016-10-11 NOTE — Progress Notes (Signed)
Subjective: : Monica Cox is a 26 y.o. year old 554P3013 female at 5 days status post spontaneous vaginal delivery on 10/05/2016 who presents to [facility]U reporting worsening SOB. She was seen at [facility] 10/08/2016 for shortness of breath and a nonproductive cough and was diagnosed with fluid overload which was treated with Lasix. Patient had a dramatic improvement in symptoms and was discharged home on HCTZ. Shortness of breath and cough along with significant bilateral pedal edema return the following day and have worsened since then. Patient also reports that her ribs are very sore (pointing to upper abdomen) which she attributes to coughing.   Patient has a history of postpartum preeclampsia after G2. Had normal blood pressures this pregnancy. Blood pressures were mildly elevated at [facility] on 10/08/2016. Preeclampsia labs were normal except for mildly elevated AST. Patient had not received formal diagnosis of preeclampsia or magnesium sulfate with this pregnancy. Pt admitted with elevated LFT, normal platelets, mild BP elevations for 24 hours Mag sulfate. Patient reports tolerating PO.  Denies chest pain at this time.  Objective: I have reviewed patient's vital signs, intake and output and labs.  Intake/Output Summary (Last 24 hours) at 10/11/16 0743 Last data filed at 10/11/16 0355  Gross per 24 hour  Intake           545.08 ml  Output             3725 ml  Net         -3179.92 ml  Temp:  [97.8 F (36.6 C)-99.5 F (37.5 C)] 98.2 F (36.8 C) (01/06 0700) Pulse Rate:  [58-79] 74 (01/06 0700) Resp:  [16-20] 18 (01/06 0700) BP: (111-169)/(60-95) 121/62 (01/06 0700) SpO2:  [96 %-100 %] 98 % (01/06 0700) Weight:  [96.2 kg (212 lb)-100.2 kg (220 lb 12.8 oz)] 96.2 kg (212 lb) (01/06 0500)  BP 121/62 (BP Location: Right Arm)   Pulse 74   Temp 98.2 F (36.8 C) (Oral)   Resp 18   Ht 5' 3.5" (1.613 m)   Wt 96.2 kg (212 lb)   SpO2 98%   Breastfeeding? No   BMI 36.97  kg/m    General: alert, cooperative and no distress Resp: clear to auscultation bilaterally GI: soft, non-tender; bowel sounds normal; no masses,  no organomegaly Extremities: edema 2+, Homans sign is negative, no sign of DVT and venous stasis dermatitis noted Vaginal Bleeding: minimal , CMP Latest Ref Rng & Units 10/11/2016 10/10/2016 10/08/2016  Glucose 65 - 99 mg/dL 90 82 90  BUN 6 - 20 mg/dL 12 12 9   Creatinine 0.44 - 1.00 mg/dL 1.610.66 0.960.70 0.450.70  Sodium 135 - 145 mmol/L 140 138 139  Potassium 3.5 - 5.1 mmol/L 3.2(L) 3.8 3.0(L)  Chloride 101 - 111 mmol/L 104 104 107  CO2 22 - 32 mmol/L 26 26 25   Calcium 8.9 - 10.3 mg/dL 7.9(L) 8.7(L) 8.2(L)  Total Protein 6.5 - 8.1 g/dL 4.0(J5.6(L) 6.4(L) 5.5(L)  Total Bilirubin 0.3 - 1.2 mg/dL 0.4 0.6 0.3  Alkaline Phos 38 - 126 U/L 145(H) 170(H) 145(H)  AST 15 - 41 U/L 51(H) 60(H) 63(H)  ALT 14 - 54 U/L 50 55(H) 34     Assessment/Plan: Postpartum preEclampsia,  Do not suspect PE. Continue Mag x 24 hours. (9 pm) cmp in am.  LOS: 0 days    Chiquita Heckert V 10/11/2016, 7:40 AM

## 2016-10-11 NOTE — Progress Notes (Signed)
Dr. Penne LashLeggett notified of Results for Monica Cox, [name] (MRN [id number]) as of 10/11/2016 15:18  Ref. Range 10/11/2016 05:41  B Natriuretic Peptide Latest Ref Range: 0.0 - 100.0 pg/mL 307.9 (H)   No orders received at this time.

## 2016-10-12 DIAGNOSIS — O1495 Unspecified pre-eclampsia, complicating the puerperium: Secondary | ICD-10-CM | POA: Diagnosis not present

## 2016-10-12 LAB — COMPREHENSIVE METABOLIC PANEL
ALT: 54 U/L (ref 14–54)
AST: 47 U/L — ABNORMAL HIGH (ref 15–41)
Albumin: 2.7 g/dL — ABNORMAL LOW (ref 3.5–5.0)
Alkaline Phosphatase: 146 U/L — ABNORMAL HIGH (ref 38–126)
Anion gap: 7 (ref 5–15)
BUN: 13 mg/dL (ref 6–20)
CHLORIDE: 104 mmol/L (ref 101–111)
CO2: 27 mmol/L (ref 22–32)
CREATININE: 0.72 mg/dL (ref 0.44–1.00)
Calcium: 7.3 mg/dL — ABNORMAL LOW (ref 8.9–10.3)
GFR calc Af Amer: >60 mL/min (ref >60)
Glucose, Bld: 93 mg/dL (ref 65–99)
Potassium: 3.4 mmol/L — ABNORMAL LOW (ref 3.5–5.1)
SODIUM: 138 mmol/L (ref 135–145)
Total Bilirubin: 0.7 mg/dL (ref 0.3–1.2)
Total Protein: 6.3 g/dL — ABNORMAL LOW (ref 6.5–8.1)

## 2016-10-12 MED ORDER — PANTOPRAZOLE SODIUM 40 MG PO TBEC: 40.0000 mg | DELAYED_RELEASE_TABLET | Freq: Every day | ORAL | 6 refills | Status: DC

## 2016-10-12 MED ORDER — HYDROCHLOROTHIAZIDE 25 MG PO TABS: 25.0000 mg | ORAL_TABLET | Freq: Every day | ORAL | 0 refills | Status: DC

## 2016-10-12 MED ORDER — LABETALOL HCL 100 MG PO TABS: 100.0000 mg | ORAL_TABLET | Freq: Two times a day (BID) | ORAL | 1 refills | Status: DC

## 2016-10-12 NOTE — Discharge Summary (Signed)
Physician Discharge Summary  Patient ID: [name] MRN: [id number] DOB/AGE: 01/12/1991 26 y.o.  Admit date: 10/10/2016 Discharge date: 10/12/2016  Admission Diagnoses: postpartum preeclampsia,   Discharge Diagnoses:  Principal Problem:   Postpartum depression associated with fourth pregnancy Active Problems:   Preeclampsia in postpartum period improved  Discharged Condition: good  Hospital Course:  HPI: Monica Cox is a 26 y.o. year old [id number] female at 5 days status post spontaneous vaginal delivery on 10/05/2016 who presents to [facility]U reporting worsening SOB. She was seen at [facility] 10/08/2016 for shortness of breath and a nonproductive cough and was diagnosed with fluid overload which was treated with Lasix. Patient had a dramatic improvement in symptoms and was discharged home on HCTZ. Shortness of breath and cough along with significant bilateral pedal edema return the following day and have worsened since then. Patient also reports that her ribs are very sore (pointing to upper abdomen) which she attributes to coughing.   Patient has a history of postpartum preeclampsia after G2. Had normal blood pressures this pregnancy. Blood pressures were mildly elevated at [facility] on 10/08/2016. Preeclampsia labs were normal except for mildly elevated AST. Patient has not received formal diagnosis of preeclampsia or magnesium sulfate with this pregnancy. patient received Mag sulfate x 24 hours, plus diuretics,   she had 11KG of weight loss from 1/3 til discharge 1/7 Blood pressures resolved and chest discomfort improved.  Pt has GERD not being tx;d and source of chest pain may have been GI HER LFT'S REMAINED SLIGHTLY ELEVATED, AND WILL REQUIRE FOLLOWUP DIURETIC ON DISCHARGE, AND WILL ADD LABETALOL DUE TO BP VOLATILITY. Consults: None  Significant Diagnostic Studies: labs:  CMP Latest Ref Rng & Units 10/12/2016 10/11/2016 10/10/2016  Glucose 65 - 99 mg/dL 93 90 82   BUN 6 - 20 mg/dL 13 12 12   Creatinine 0.44 - 1.00 mg/dL 4.09 8.11 9.14  Sodium 135 - 145 mmol/L 138 140 138  Potassium 3.5 - 5.1 mmol/L 3.4(L) 3.2(L) 3.8  Chloride 101 - 111 mmol/L 104 104 104  CO2 22 - 32 mmol/L 27 26 26   Calcium 8.9 - 10.3 mg/dL 7.3(L) 7.9(L) 8.7(L)  Total Protein 6.5 - 8.1 g/dL 6.3(L) 5.6(L) 6.4(L)  Total Bilirubin 0.3 - 1.2 mg/dL 0.7 0.4 0.6  Alkaline Phos 38 - 126 U/L 146(H) 145(H) 170(H)  AST 15 - 41 U/L 47(H) 51(H) 60(H)  ALT 14 - 54 U/L 54 50 55(H)    Treatments: MAG SULFATE X 24  Discharge Exam: Blood pressure (!) 161/82, pulse 97, temperature 98.6 F (37 C), temperature source Oral, resp. rate 18, height 5' 3.5" (1.613 m), weight 95.3 kg (210 lb 0.8 oz), SpO2 97 %, not currently breastfeeding.  MOST BP'S HAVE BEEN COMPLETELY NORMAL. General appearance: alert, cooperative and no distress Resp: clear to auscultation bilaterally GI: soft, non-tender; bowel sounds normal; no masses,  no organomegaly Extremities: Homans sign is negative, no sign of DVT  Disposition: 01-Home or Self Care  Discharge Instructions    Diet - low sodium heart healthy    Complete by:  As directed    Increase activity slowly    Complete by:  As directed     LABETALOL, HTCZ, PROTONIX AT DISCHARGE  Follow-up Information    Tilda Burrow, MD Follow up in 4 day(s).   Specialties:  Obstetrics and Gynecology, Radiology Contact information: [address] [place] [address] [phone]           Signed: Tilda Burrow  10/12/2016, 9:47 AM

## 2016-10-12 NOTE — Discharge Instructions (Signed)

## 2016-10-12 NOTE — Plan of Care (Signed)
Problem: Pain Management: Goal: Relief or control of pain will improve Outcome: Completed/Met Date Met: 10/12/16 Good pain control on po Motrin per patient.

## 2016-10-12 NOTE — Progress Notes (Signed)

## 2016-10-16 ENCOUNTER — Ambulatory Visit (INDEPENDENT_AMBULATORY_CARE_PROVIDER_SITE_OTHER): Payer: PRIVATE HEALTH INSURANCE | Admitting: Obstetrics and Gynecology

## 2016-10-16 ENCOUNTER — Encounter: Payer: Self-pay | Admitting: Obstetrics and Gynecology

## 2016-10-16 VITALS — BP 118/82 | HR 71 | Wt 196.8 lb

## 2016-10-16 DIAGNOSIS — O1495 Unspecified pre-eclampsia, complicating the puerperium: Secondary | ICD-10-CM

## 2016-10-16 DIAGNOSIS — F53 Postpartum depression: Principal | ICD-10-CM

## 2016-10-16 DIAGNOSIS — R634 Abnormal weight loss: Secondary | ICD-10-CM | POA: Diagnosis not present

## 2016-10-16 DIAGNOSIS — I10 Essential (primary) hypertension: Secondary | ICD-10-CM | POA: Diagnosis not present

## 2016-10-16 DIAGNOSIS — O99345 Other mental disorders complicating the puerperium: Secondary | ICD-10-CM

## 2016-10-16 NOTE — Progress Notes (Signed)
[facility] Visit  10/16/16            Patient name: Monica Cox MRN [id number]  Date of birth: 04-Mar-1991  CC & HPI:  Monica Cox is a 26 y.o. female presenting today for follow up from her recent hospitalization. Pt notes that she has been actively losing weight with a 36 lb weight loss since being seen in the hospital. Pt has been taking HCTZ BID for her HTN. Pt denies blurred vision, HA, and any other symptoms.   ROS:  ROS  36 lb weight loss in 8 days Otherwise negative   Pertinent History Reviewed:   Reviewed: Significant for PP HTN Medical         Past Medical History:  Diagnosis Date  . Anemia   . Asthma   . H/O trichomonas 08/08/2014   2015 pregnancy   . History of chlamydia 03/19/2016   2015 pregnancy   . Hypertension    pp htn  . Nausea 02/06/2016  . Reflux   . Smoker 02/06/2016  . Trichomonas infection 08-08-2014  . Vaginal Pap smear, abnormal   . Vertigo 09/21/2014   antivert rx 09/21/2014                                Surgical Hx:    Past Surgical History:  Procedure Laterality Date  . DILATION AND CURETTAGE OF UTERUS     [facility] Dr. Despina HiddenEure performed  . ESOPHAGOGASTRODUODENOSCOPY ENDOSCOPY     Medications: Reviewed & Updated - see associated section                       Current Outpatient Prescriptions:  .  acetaminophen (TYLENOL) 325 MG tablet, Take 2 tablets (650 mg total) by mouth every 4 (four) hours as needed (for pain scale < 4)., Disp: 30 tablet, Rfl: 0 .  albuterol (PROVENTIL HFA;VENTOLIN HFA) 108 (90 Base) MCG/ACT inhaler, Inhale 1-2 puffs into the lungs every 4 (four) hours as needed for wheezing or shortness of breath., Disp: 1 Inhaler, Rfl: 0 .  escitalopram (LEXAPRO) 10 MG tablet, Take 10 mg by mouth at bedtime., Disp: , Rfl:  .  hydrochlorothiazide (HYDRODIURIL) 25 MG tablet, Take 1 tablet (25 mg total) by mouth daily., Disp: 10 tablet, Rfl: 0 .  labetalol (NORMODYNE) 100 MG tablet, Take 1 tablet (100 mg total) by mouth 2 (two)  times daily., Disp: 30 tablet, Rfl: 1 .  ondansetron (ZOFRAN) 4 MG tablet, Take 1 tablet (4 mg total) by mouth every 4 (four) hours as needed for nausea., Disp: 20 tablet, Rfl: 0 .  pantoprazole (PROTONIX) 40 MG tablet, Take 1 tablet (40 mg total) by mouth daily., Disp: 30 tablet, Rfl: 6 .  ibuprofen (ADVIL,MOTRIN) 600 MG tablet, Take 1 tablet (600 mg total) by mouth every 6 (six) hours. (Patient not taking: Reported on 10/16/2016), Disp: 30 tablet, Rfl: 0 .  oxycodone (OXY-IR) 5 MG capsule, Take 1 capsule (5 mg total) by mouth every 4 (four) hours as needed. (Patient not taking: Reported on 10/16/2016), Disp: 5 capsule, Rfl: 0 .  potassium chloride SA (K-DUR,KLOR-CON) 20 MEQ tablet, Take 1 tablet (20 mEq total) by mouth daily. (Patient not taking: Reported on 10/16/2016), Disp: 3 tablet, Rfl: 0 .  promethazine (PHENERGAN) 25 MG tablet, Take 12.5-25 mg by mouth every 6 (six) hours as needed for nausea or vomiting., Disp: , Rfl:  Social History: Reviewed -  reports that she has quit smoking. Her smoking use included Cigars. She quit after 3.00 years of use. She has never used smokeless tobacco.  Objective Findings:  Vitals: Blood pressure 118/82, pulse 71, weight 196 lb 12.8 oz (89.3 kg), not currently breastfeeding.  Physical Examination:  General appearance - alert, well appearing, and in no distress and oriented to person, place, and time Mental status - alert, oriented to person, place, and time, normal mood, behavior, speech, dress, motor activity, and thought processes Eyes - pupils equal and reactive, extraocular eye movements intact   Assessment & Plan:   A:  1. Resolved Post Partum pre-eclampsia  P:  1. Resumes Post Partum care  By signing my name below, I, Soijett Blue, attest that this documentation has been prepared under the direction and in the presence of Tilda Burrow, MD. Electronically Signed: Soijett Blue, ED Scribe. 10/16/16. 3:49 PM.  I personally performed the  services described in this documentation, which was SCRIBED in my presence. The recorded information has been reviewed and considered accurate. It has been edited as necessary during review. Tilda Burrow, MD

## 2016-11-17 ENCOUNTER — Encounter: Payer: Self-pay | Admitting: Obstetrics and Gynecology

## 2016-11-17 ENCOUNTER — Ambulatory Visit (INDEPENDENT_AMBULATORY_CARE_PROVIDER_SITE_OTHER): Payer: PRIVATE HEALTH INSURANCE | Admitting: Obstetrics and Gynecology

## 2016-11-17 VITALS — BP 122/74 | HR 72 | Wt 197.0 lb

## 2016-11-17 DIAGNOSIS — O99345 Other mental disorders complicating the puerperium: Secondary | ICD-10-CM

## 2016-11-17 DIAGNOSIS — F53 Postpartum depression: Secondary | ICD-10-CM

## 2016-11-17 DIAGNOSIS — Z3202 Encounter for pregnancy test, result negative: Secondary | ICD-10-CM | POA: Diagnosis not present

## 2016-11-17 NOTE — Progress Notes (Signed)
Patient ID: Monica PeckBriana N Pickard, female   DOB: 01/26/1991, 26 y.o.   MRN: [id number]  Subjective:  Monica PeckBriana N Pickard is a 26 y.o. female who presents for a 2 weeks postpartum visit   Chief Complaint  Patient presents with  . Postpartum Care     Patient concerns: Pt reports she is feeling depressed since having her baby d/t feeling frequently ill and feeling as though she is having difficulty with "everything" and "messes everything up". Pt also feels like she does not want to bother people by discussing her issues with them. She also feels pressure from her husband to have the house perfectly clean and kids taken care of when he comes home from work.  Per pt, her husband is 5 years younger than her, works 12 hour shifts and expresses discontent that he and the pt do not spend enough time together or go out anymore. She states she "does not want to be here" but denies SI or HI. She denies any plan of suicide.   Discussion: 1. Extensive discussion of pts mental well being and depression. Encouraged pt to discuss her struggles with her mother, who sits in at the appointment and expresses her support of the pt. Encouraged pt to share her burdens with friends and family.   2. Discussed risks/benefits of antidepressants.   At end of discussion, pt had opportunity to ask questions and has no further questions at this time.   Specific discussion of mental health as noted above. Greater than 50% was spent in counseling and coordination of care with the patient.   Total time greater than: 45 minutes.    Prenatal and intrapartum course notable for preeclampsia in postpartum period, improved    Patient is sexually active and reports she and her husband both decided to resume activity.   She reports she would like to be started on OCP.   The following portions of the patient's history were reviewed and updated as appropriate: allergies, current medications, past family history, past medical history, past  surgical history and problem list.  Review of Systems    See Subjective, otherwise negative ROS.  Objective:  BP 122/74 (BP Location: Right Arm, Patient Position: Sitting, Cuff Size: Normal)   Pulse 72   Wt 197 lb (89.4 kg)   LMP 12/31/2015 (Exact Date)   BMI 34.35 kg/m   General:  alert, cooperative and no distress     Lungs: clear to auscultation bilaterally  Heart:  regular rate and rhythm, S1, S2 normal, no murmur  Abdomen: soft, non-tender; bowel sounds normal; no masses,  no organomegaly   Vulva:  normal, well healed   Vagina: normal vagina  Cervix:  normal  Corpus: normal size, contour, position, consistency, mobility, non-tender, tiny   Adnexa:  normal adnexa          Assessment:  1.  postpartum exam.  2. Contraception: Sprintec   3.  Post-partum depression moderate to severe without SI or HI 4. Marital instability   Plan:  1. Rx Sprintec  2. Offered personal phone # to give to pts husband, Benson NorwayLamar 3. F/u in 1 week for recheck   By signing my name below, I, Doreatha MartinEva Mathews, attest that this documentation has been prepared under the direction and in the presence of Tilda BurrowJohn Hiroshi Krummel V, MD. Electronically Signed: Doreatha MartinEva Mathews, ED Scribe. 11/17/16. 12:14 PM.  I personally performed the services described in this documentation, which was SCRIBED in my presence. The recorded information has been reviewed and considered  accurate. It has been edited as necessary during review. Tilda Burrow, MD

## 2016-11-27 ENCOUNTER — Ambulatory Visit (INDEPENDENT_AMBULATORY_CARE_PROVIDER_SITE_OTHER): Payer: PRIVATE HEALTH INSURANCE | Admitting: Obstetrics and Gynecology

## 2016-11-27 ENCOUNTER — Encounter: Payer: Self-pay | Admitting: Obstetrics and Gynecology

## 2016-11-27 VITALS — BP 110/80 | HR 76 | Wt 201.2 lb

## 2016-11-27 DIAGNOSIS — O99345 Other mental disorders complicating the puerperium: Secondary | ICD-10-CM

## 2016-11-27 DIAGNOSIS — O906 Postpartum mood disturbance: Secondary | ICD-10-CM

## 2016-11-27 DIAGNOSIS — F53 Postpartum depression: Principal | ICD-10-CM

## 2016-11-27 MED ORDER — ESCITALOPRAM OXALATE 10 MG PO TABS: 10.0000 mg | ORAL_TABLET | Freq: Every day | ORAL | 5 refills | Status: DC

## 2016-11-27 NOTE — Patient Instructions (Signed)

## 2016-11-27 NOTE — Progress Notes (Signed)
  Subjective:  Monica Cox is a 26 y.o. female who presents for a 3 weeks postpartum visit This is a follow-up of her visit there was extensive for postpartum depression Patient concerns of the following: 1 relationship with her children (ages 927, 472, and 781 month old), 2. marriage to her husband (421 y.o.), and 3. body habitus . Pt states that her 28 year old son is complaining about wanting more 1-on-1 time with the pt. patient's son is the first of 893 female partners. And he is indifferent toward the child. The child expresses concern that the father is not liking him  . Pt notes that her family members have contributed taking care of her infant so that she may work out. Pt notes that she has been off lexapro for the past month. Pt reports associated transient SI with a fleeting plan of slitting her wrists. Pt denies any other symptoms.   Patient's currently married, partner indifferent, younger  Discussion: 1. Extensive discussion of pts mental well being and depression. Encouraged pt to share her burdens with friends and family.   At end of discussion, pt had opportunity to ask questions and has no further questions at this time.   Specific discussion of mental health as noted above. Greater than 50% was spent in counseling and coordination of care with the patient.   Total time greater than: 25 minutes.    Prenatal and intrapartum course notable for preeclampsia in postpartum period, improved   Patient is sexually active.   The following portions of the patient's history were reviewed and updated as appropriate: allergies, current medications, past family history, past medical history, past surgical history and problem list.  Review of Systems    See Subjective, otherwise negative ROS.  Objective:  BP 110/80   Pulse 76   Wt 201 lb 3.2 oz (91.3 kg)   LMP 11/19/2016   BMI 35.08 kg/m   General:  alert, cooperative and no distress     Lungs: clear to auscultation bilaterally   Heart:  regular rate and rhythm, S1, S2 normal, no murmur  Abdomen: soft, non-tender; bowel sounds normal; no masses,  no organomegaly   Vulva:  normal  Vagina: normal vagina  Cervix:  normal  Corpus: normal size, contour, position, consistency, mobility, non-tender  Adnexa:  normal adnexa          Assessment:  1.  postpartum exam.  2. Contraception: Sprintec 3.  Pp depression, self-image issues, unrealistic needs, needy personality 4. Martial instability   Plan:  1. Refill Lexapro Rx 2 patient's that realistic expectations, to keep the checklist of the 3 issues she described today and each day place and check with each one that she worked from a small bit day small increments of changed to be viewed as success.  2.  2. Follow up 4/1/2018Discussed weight loss strategies prior to  Wedding in early May  By signing my name below, I, Soijett Blue, attest that this documentation has been prepared under the direction and in the presence of Tilda BurrowJohn Roizy Harold V, MD. Electronically Signed: Soijett Blue, ED Scribe. 11/27/16. 3:03 PM. I personally performed the services described in this documentation, which was SCRIBED in my presence. The recorded information has been reviewed and considered accurate. It has been edited as necessary during review. Tilda BurrowFERGUSON,Kurt Hoffmeier V, MD

## 2016-12-12 ENCOUNTER — Telehealth: Payer: Self-pay | Admitting: Obstetrics and Gynecology

## 2016-12-12 NOTE — Telephone Encounter (Signed)
Pt called stating that Dr. Emelda FearFerguson has for gotten to place her Birth control into her pharmacy. Pt uses [facility], Please contact pt

## 2016-12-14 ENCOUNTER — Other Ambulatory Visit: Payer: Self-pay | Admitting: Obstetrics and Gynecology

## 2016-12-14 DIAGNOSIS — Z30011 Encounter for initial prescription of contraceptive pills: Secondary | ICD-10-CM

## 2016-12-14 MED ORDER — NORGESTIMATE-ETH ESTRADIOL 0.25-35 MG-MCG PO TABS: 1.0000 | ORAL_TABLET | Freq: Every day | ORAL | 11 refills | Status: DC

## 2016-12-14 NOTE — Progress Notes (Signed)
sprintec ordered

## 2016-12-18 ENCOUNTER — Telehealth: Payer: Self-pay | Admitting: *Deleted

## 2016-12-18 NOTE — Telephone Encounter (Signed)
Patient called stating prescription for BCP was not sent to pharmacy. Advised patient that prescription was sent and received on 3/11. Pt verbalized understanding stating no one from the pharmacy called her. Informed patient to give them a call.

## 2017-01-07 ENCOUNTER — Encounter: Payer: Self-pay | Admitting: Obstetrics and Gynecology

## 2017-01-07 ENCOUNTER — Ambulatory Visit (INDEPENDENT_AMBULATORY_CARE_PROVIDER_SITE_OTHER): Payer: PRIVATE HEALTH INSURANCE | Admitting: Obstetrics and Gynecology

## 2017-01-07 VITALS — BP 118/74 | HR 80 | Wt 199.0 lb

## 2017-01-07 DIAGNOSIS — L739 Follicular disorder, unspecified: Secondary | ICD-10-CM

## 2017-01-07 MED ORDER — DOXYCYCLINE HYCLATE 100 MG PO CAPS: 100.0000 mg | ORAL_CAPSULE | Freq: Two times a day (BID) | ORAL | 1 refills | Status: DC
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
## 2017-01-12 ENCOUNTER — Ambulatory Visit: Payer: PRIVATE HEALTH INSURANCE | Admitting: Obstetrics and Gynecology

## 2017-01-16 ENCOUNTER — Ambulatory Visit: Payer: PRIVATE HEALTH INSURANCE | Admitting: Obstetrics and Gynecology

## 2017-01-26 ENCOUNTER — Ambulatory Visit: Payer: PRIVATE HEALTH INSURANCE | Admitting: Obstetrics and Gynecology

## 2017-03-12 ENCOUNTER — Ambulatory Visit (INDEPENDENT_AMBULATORY_CARE_PROVIDER_SITE_OTHER): Payer: PRIVATE HEALTH INSURANCE | Admitting: Advanced Practice Midwife

## 2017-03-12 ENCOUNTER — Encounter: Payer: Self-pay | Admitting: Advanced Practice Midwife

## 2017-03-12 VITALS — BP 110/60 | HR 80 | Ht 63.2 in | Wt 190.0 lb

## 2017-03-12 DIAGNOSIS — Z113 Encounter for screening for infections with a predominantly sexual mode of transmission: Secondary | ICD-10-CM

## 2017-03-12 MED ORDER — ESCITALOPRAM OXALATE 10 MG PO TABS: 10.0000 mg | ORAL_TABLET | Freq: Every day | ORAL | 5 refills | Status: DC

## 2017-03-12 NOTE — Progress Notes (Signed)
[facility] Visit  Patient name: Monica Cox MRN [id number]  Date of birth: 1991-08-19  CC & HPI:  Monica Cox is a 26 y.o. African American female presenting today for STD screen (husband cheated) and cramping for 2 weeks. Misses COCs all the time . Going to try to get better.  Wants lexapro refill.  Says husband gets "blisters" in the same spot of his penis, says clinic said to put neosporin on it.  Advised culture when it is open.   Pertinent History Reviewed:  Medical & Surgical Hx:   Past Medical History:  Diagnosis Date  . Anemia   . Asthma   . H/O trichomonas 08/08/2014   2015 pregnancy   . History of chlamydia 03/19/2016   2015 pregnancy   . Hypertension    pp htn  . Nausea 02/06/2016  . Reflux   . Smoker 02/06/2016  . Trichomonas infection 08-08-2014  . Vaginal Pap smear, abnormal   . Vertigo 09/21/2014   antivert rx 09/21/2014     Past Surgical History:  Procedure Laterality Date  . DILATION AND CURETTAGE OF UTERUS     MAB Dr. Despina HiddenEure performed  . ESOPHAGOGASTRODUODENOSCOPY ENDOSCOPY     Family History  Problem Relation Age of Onset  . Ulcers Father   . Hypertension Father   . Hypertension Mother   . Ulcers Paternal Grandmother   . Diabetes Paternal Grandmother   . Ulcers Paternal Aunt   . Liver disease Neg Hx   . Colon cancer Neg Hx     Current Outpatient Prescriptions:  .  escitalopram (LEXAPRO) 10 MG tablet, Take 1 tablet (10 mg total) by mouth at bedtime., Disp: 30 tablet, Rfl: 5 .  norgestimate-ethinyl estradiol (ORTHO-CYCLEN,SPRINTEC,PREVIFEM) 0.25-35 MG-MCG tablet, Take 1 tablet by mouth daily., Disp: 1 Package, Rfl: 11 .  albuterol (PROVENTIL HFA;VENTOLIN HFA) 108 (90 Base) MCG/ACT inhaler, Inhale 1-2 puffs into the lungs every 4 (four) hours as needed for wheezing or shortness of breath. (Patient not taking: Reported on 03/12/2017), Disp: 1 Inhaler, Rfl: 0 .  doxycycline (VIBRAMYCIN) 100 MG capsule, Take 1 capsule (100 mg total) by mouth  2 (two) times daily. (Patient not taking: Reported on 03/12/2017), Disp: 14 capsule, Rfl: 1 .  pantoprazole (PROTONIX) 40 MG tablet, Take 1 tablet (40 mg total) by mouth daily. (Patient not taking: Reported on 03/12/2017), Disp: 30 tablet, Rfl: 6 Social History: Reviewed -  reports that she has quit smoking. Her smoking use included Cigars. She quit after 3.00 years of use. She has never used smokeless tobacco.  Review of Systems:   Constitutional: Negative for fever and chills Eyes: Negative for visual disturbances Respiratory: Negative for shortness of breath, dyspnea Cardiovascular: Negative for chest pain or palpitations  Gastrointestinal: Negative for vomiting, diarrhea and constipation; no abdominal pain Genitourinary: Negative for dysuria and urgency, vaginal irritation or itching Musculoskeletal: Negative for back pain, joint pain, myalgias  Neurological: Negative for dizziness and headaches    Objective Findings:    Physical Examination: General appearance - well appearing, and in no distress Mental status - alert, oriented to person, place, and time Chest:  Normal respiratory effort Heart - normal rate and regular rhythm Abdomen:  Soft, nontender Pelvic: normal appearing DC.  No lesions. Small hemorrhioid Musculoskeletal:  Normal range of motion without pain Extremities:  No edema    No results found for this or any previous visit (from the past 24 hour(s)).    Assessment & Plan:  A:  STD screen; cramps d/t missing pills?  P:  Get excellent at taking pills.  [place] if cramps persist   Return for If you have any problems.  CRESENZO-DISHMAN,Shawntel Farnworth CNM 03/12/2017 3:19 PM

## 2017-03-13 LAB — RPR: RPR: NONREACTIVE

## 2017-03-13 LAB — HIV ANTIBODY (ROUTINE TESTING W REFLEX): HIV Screen 4th Generation wRfx: NONREACTIVE

## 2017-03-15 LAB — GC/CHLAMYDIA PROBE AMP
CHLAMYDIA, DNA PROBE: NEGATIVE
Neisseria gonorrhoeae by PCR: NEGATIVE

## 2017-03-16 ENCOUNTER — Telehealth: Payer: Self-pay | Admitting: Obstetrics & Gynecology

## 2017-03-16 NOTE — Telephone Encounter (Signed)
Informed lab results were normal. Verbalized gratitude.

## 2017-03-17 ENCOUNTER — Ambulatory Visit: Payer: PRIVATE HEALTH INSURANCE | Admitting: Women's Health

## 2017-03-18 ENCOUNTER — Ambulatory Visit: Payer: PRIVATE HEALTH INSURANCE | Admitting: Women's Health

## 2017-05-04 ENCOUNTER — Encounter (HOSPITAL_COMMUNITY): Payer: Self-pay | Admitting: Adult Health

## 2017-05-04 ENCOUNTER — Emergency Department (HOSPITAL_COMMUNITY)
Admission: EM | Admit: 2017-05-04 | Discharge: 2017-05-04 | Disposition: A | Discharge status: Home / Self Care | Payer: Medicaid Other | Attending: Emergency Medicine | Admitting: Emergency Medicine

## 2017-05-04 ENCOUNTER — Emergency Department (HOSPITAL_COMMUNITY): Payer: Medicaid Other

## 2017-05-04 DIAGNOSIS — Z79899 Other long term (current) drug therapy: Secondary | ICD-10-CM | POA: Diagnosis not present

## 2017-05-04 DIAGNOSIS — Y999 Unspecified external cause status: Secondary | ICD-10-CM | POA: Diagnosis not present

## 2017-05-04 DIAGNOSIS — I10 Essential (primary) hypertension: Secondary | ICD-10-CM | POA: Diagnosis not present

## 2017-05-04 DIAGNOSIS — W19XXXA Unspecified fall, initial encounter: Secondary | ICD-10-CM | POA: Diagnosis not present

## 2017-05-04 DIAGNOSIS — Y929 Unspecified place or not applicable: Secondary | ICD-10-CM | POA: Insufficient documentation

## 2017-05-04 DIAGNOSIS — M7989 Other specified soft tissue disorders: Secondary | ICD-10-CM | POA: Diagnosis not present

## 2017-05-04 DIAGNOSIS — Y939 Activity, unspecified: Secondary | ICD-10-CM | POA: Diagnosis not present

## 2017-05-04 DIAGNOSIS — Z87891 Personal history of nicotine dependence: Secondary | ICD-10-CM | POA: Diagnosis not present

## 2017-05-04 DIAGNOSIS — S6991XA Unspecified injury of right wrist, hand and finger(s), initial encounter: Secondary | ICD-10-CM | POA: Diagnosis present

## 2017-05-04 NOTE — ED Notes (Signed)
Pt going for xray 

## 2017-05-04 NOTE — ED Notes (Signed)
Patient given discharge instruction, verbalized understand. Patient ambulatory out of the department.  

## 2017-05-04 NOTE — ED Triage Notes (Signed)
Presents with right ring finger pain that began yesterday after falling and landing on finger, she is unable to full flex and extend that finger. Rates pain 9/10

## 2017-05-04 NOTE — Discharge Instructions (Signed)
Wear finger splint as directed. Take ibuprofen or Aleve as needed for pain and inflammation. Apply ice to area as tolerated. Follow up with hand specialist listed below for further evaluation. Return to ED for worsening pain, additional injury, numbness, weakness, temperature or color change of joint.

## 2017-05-04 NOTE — ED Provider Notes (Signed)
[facility] Provider Note   CSN: [id number] Arrival date & time: 05/04/17  1537     History   Chief Complaint Chief Complaint  Patient presents with  . Finger Injury    HPI Monica Cox is a 26 y.o. female.  HPI Patient presents to ED for evaluation of right ring finger pain that occurred yesterday after falling and landing on extended hand. Since then she has had pain beginning at the MCP joint and pain with full flexion of the finger. She reports some improvement in swelling with ice pack and ibuprofen. She denies any previous fracture dislocation in the finger. She denies any numbness, headache injury, loss of consciousness, fevers, color or temperature change of joint.  Past Medical History:  Diagnosis Date  . Anemia   . Asthma   . H/O trichomonas 08/08/2014   2015 pregnancy   . History of chlamydia 03/19/2016   2015 pregnancy   . Hypertension    pp htn  . Nausea 02/06/2016  . Reflux   . Smoker 02/06/2016  . Trichomonas infection 08-08-2014  . Vaginal Pap smear, abnormal   . Vertigo 09/21/2014   antivert rx 09/21/2014      Patient Active Problem List   Diagnosis Date Noted  . Depression, postpartum 10/11/2016    Class: Acute  . PROM (premature rupture of membranes) 10/04/2016  . Marijuana use 03/24/2016  . Depression with anxiety 03/19/2016  . Smoker 02/06/2016  . Preeclampsia in postpartum period 11/26/2014  . Heart murmur 09/21/2014  . Rh negative state in antepartum period 05/01/2014    Past Surgical History:  Procedure Laterality Date  . DILATION AND CURETTAGE OF UTERUS     MAB Dr. Despina Hidden performed  . ESOPHAGOGASTRODUODENOSCOPY ENDOSCOPY      OB History    Gravida Para Term Preterm AB Living   4 3 3  0 1 3   SAB TAB Ectopic Multiple Live Births   1 0 0 0 3       Home Medications    Prior to Admission medications   Medication Sig Start Date End Date Taking? Authorizing Provider  albuterol (PROVENTIL HFA;VENTOLIN HFA) 108 (90 Base)  MCG/ACT inhaler Inhale 1-2 puffs into the lungs every 4 (four) hours as needed for wheezing or shortness of breath. Patient not taking: Reported on 03/12/2017 07/02/16   Cheral Marker, CNM  doxycycline (VIBRAMYCIN) 100 MG capsule Take 1 capsule (100 mg total) by mouth 2 (two) times daily. Patient not taking: Reported on 03/12/2017 01/07/17   Tilda Burrow, MD  escitalopram (LEXAPRO) 10 MG tablet Take 1 tablet (10 mg total) by mouth at bedtime. 03/12/17   Cresenzo-Dishmon, Scarlette Calico, CNM  norgestimate-ethinyl estradiol (ORTHO-CYCLEN,SPRINTEC,PREVIFEM) 0.25-35 MG-MCG tablet Take 1 tablet by mouth daily. 12/14/16   Tilda Burrow, MD  pantoprazole (PROTONIX) 40 MG tablet Take 1 tablet (40 mg total) by mouth daily. Patient not taking: Reported on 03/12/2017 10/12/16   Tilda Burrow, MD    Family History Family History  Problem Relation Age of Onset  . Ulcers Father   . Hypertension Father   . Hypertension Mother   . Ulcers Paternal Grandmother   . Diabetes Paternal Grandmother   . Ulcers Paternal Aunt   . Liver disease Neg Hx   . Colon cancer Neg Hx     Social History Social History  Substance Use Topics  . Smoking status: Former Smoker    Years: 3.00    Types: Cigars  . Smokeless tobacco: Never Used  .  Alcohol use No     Comment: occasionally; not now     Allergies   Patient has no known allergies.   Review of Systems Review of Systems  Constitutional: Negative for chills and fever.  Gastrointestinal: Negative for nausea and vomiting.  Musculoskeletal: Positive for arthralgias and joint swelling. Negative for myalgias.  Skin: Negative for color change, rash and wound.     Physical Exam Updated Vital Signs BP 122/72   Pulse 81   Temp 99.2 F (37.3 C) (Oral)   Resp 18   LMP 03/18/2017 (Exact Date)   SpO2 100%   Physical Exam  Constitutional: She appears well-developed and well-nourished. No distress.  HENT:  Head: Normocephalic and atraumatic.  Eyes: Conjunctivae  and EOM are normal. No scleral icterus.  Neck: Normal range of motion.  Pulmonary/Chest: Effort normal. No respiratory distress.  Musculoskeletal: She exhibits edema and tenderness. She exhibits no deformity.  Tenderness to palpation of the right ring finger MCP joint. There is some mild edema present. There is no color or temperature change noted. Unable to fully flex the digit while making a fist. Good strength noted to resistance. Sensation intact to light touch. Full active and passive range of motion of remaining digits. No snuffbox tenderness.  Neurological: She is alert.  Skin: No rash noted. She is not diaphoretic.  Psychiatric: She has a normal mood and affect.  Nursing note and vitals reviewed.    ED Treatments / Results  Labs (all labs ordered are listed, but only abnormal results are displayed) Labs Reviewed - No data to display  EKG  EKG Interpretation None       Radiology Dg Finger Ring Right  Result Date: 05/04/2017 CLINICAL DATA:  Fall.  Pain ring finger EXAM: RIGHT RING FINGER 2+V COMPARISON:  04/05/2013 FINDINGS: There is no evidence of fracture or dislocation. There is no evidence of arthropathy or other focal bone abnormality. Soft tissues are unremarkable. IMPRESSION: Negative. Electronically Signed   By: Marlan Palauharles  Clark M.D.   On: 05/04/2017 16:32    Procedures Procedures (including critical care time)  Medications Ordered in ED Medications - No data to display   Initial Impression / Assessment and Plan / ED Course  I have reviewed the triage vital signs and the nursing notes.  Pertinent labs & imaging results that were available during my care of the patient were reviewed by me and considered in my medical decision making (see chart for details).     Patient presents to ED for evaluation of right ring finger pain that occurred yesterday after injuring it. She states that she fell on her outstretched right hand. She denies any wrist pain, head injury  or loss of consciousness. On physical exam there is tenderness to palpation at the MCP joint of the right ring finger. She is unable to fully flex the finger into a fist. There is mild edema present. There is no color or temperature change noted. Sensation is intact to light touch. Full active and passive range of motion of the wrist and other digits. X-ray of the finger showed no fracture or dislocation. Will advise finger splint and follow up with hand specialist for further evaluation if symptoms persist. Advised ice and anti-inflammatories as needed. Patient appears stable for discharge at this time. Strict return precautions given.  Final Clinical Impressions(s) / ED Diagnoses   Final diagnoses:  Injury of finger of right hand, initial encounter    New Prescriptions New Prescriptions   No medications on file  Dietrich PatesKhatri, Erynne Kealey, PA-C 05/04/17 1641    Bethann BerkshireZammit, Joseph, MD 05/05/17 [phone]

## 2017-05-20 ENCOUNTER — Emergency Department (HOSPITAL_COMMUNITY)
Admission: EM | Admit: 2017-05-20 | Discharge: 2017-05-20 | Disposition: A | Discharge status: Home / Self Care | Payer: Medicaid Other | Attending: Emergency Medicine | Admitting: Emergency Medicine

## 2017-05-20 ENCOUNTER — Encounter (HOSPITAL_COMMUNITY): Payer: Self-pay | Admitting: *Deleted

## 2017-05-20 ENCOUNTER — Emergency Department (HOSPITAL_COMMUNITY): Payer: Medicaid Other

## 2017-05-20 DIAGNOSIS — Y939 Activity, unspecified: Secondary | ICD-10-CM | POA: Insufficient documentation

## 2017-05-20 DIAGNOSIS — W25XXXA Contact with sharp glass, initial encounter: Secondary | ICD-10-CM | POA: Diagnosis not present

## 2017-05-20 DIAGNOSIS — J45909 Unspecified asthma, uncomplicated: Secondary | ICD-10-CM | POA: Diagnosis not present

## 2017-05-20 DIAGNOSIS — Y999 Unspecified external cause status: Secondary | ICD-10-CM | POA: Diagnosis not present

## 2017-05-20 DIAGNOSIS — I10 Essential (primary) hypertension: Secondary | ICD-10-CM | POA: Diagnosis not present

## 2017-05-20 DIAGNOSIS — Z3490 Encounter for supervision of normal pregnancy, unspecified, unspecified trimester: Secondary | ICD-10-CM

## 2017-05-20 DIAGNOSIS — S6991XA Unspecified injury of right wrist, hand and finger(s), initial encounter: Secondary | ICD-10-CM | POA: Diagnosis present

## 2017-05-20 DIAGNOSIS — Z87891 Personal history of nicotine dependence: Secondary | ICD-10-CM | POA: Insufficient documentation

## 2017-05-20 DIAGNOSIS — Z333 Pregnant state, gestational carrier: Secondary | ICD-10-CM | POA: Diagnosis not present

## 2017-05-20 DIAGNOSIS — Z79899 Other long term (current) drug therapy: Secondary | ICD-10-CM | POA: Insufficient documentation

## 2017-05-20 DIAGNOSIS — S61214A Laceration without foreign body of right ring finger without damage to nail, initial encounter: Secondary | ICD-10-CM | POA: Insufficient documentation

## 2017-05-20 DIAGNOSIS — T1490XA Injury, unspecified, initial encounter: Secondary | ICD-10-CM

## 2017-05-20 DIAGNOSIS — S61411A Laceration without foreign body of right hand, initial encounter: Secondary | ICD-10-CM

## 2017-05-20 DIAGNOSIS — Y929 Unspecified place or not applicable: Secondary | ICD-10-CM | POA: Insufficient documentation

## 2017-05-20 LAB — POC URINE PREG, ED: PREG TEST UR: POSITIVE — AB

## 2017-05-20 MED ORDER — POVIDONE-IODINE 10 % EX SOLN
CUTANEOUS | Status: DC
Start: 2017-05-20 — End: 2017-05-21
  Filled 2017-05-20: qty 15

## 2017-05-20 MED ORDER — ACETAMINOPHEN 500 MG PO TABS
1000.0000 mg | ORAL_TABLET | Freq: Once | ORAL | Status: AC
  Administered 2017-05-20: 1000 mg via ORAL
  Filled 2017-05-20: qty 2

## 2017-05-20 MED ORDER — TETANUS-DIPHTH-ACELL PERTUSSIS 5-2.5-18.5 LF-MCG/0.5 IM SUSP
0.5000 mL | Freq: Once | INTRAMUSCULAR | Status: DC
  Filled 2017-05-20: qty 0.5

## 2017-05-20 MED ORDER — LIDOCAINE HCL (PF) 1 % IJ SOLN
5.0000 mL | Freq: Once | INTRAMUSCULAR | Status: AC
  Administered 2017-05-20: 5 mL

## 2017-05-20 MED ORDER — LIDOCAINE HCL (PF) 1 % IJ SOLN
INTRAMUSCULAR | Status: AC
  Administered 2017-05-20: 5 mL
  Filled 2017-05-20: qty 5

## 2017-05-20 NOTE — Discharge Instructions (Signed)
Please keep your hand clean and dry. Please change the dressing on your sutured area daily. Have your sutures removed in 7 days.  Your pregnancy test is positive. Please see your GYN specialist as soon as possible concerning your pregnancy.

## 2017-05-20 NOTE — ED Provider Notes (Addendum)
[facility] Provider Note   CSN: [id number] Arrival date & time: 05/20/17  2043     History   Chief Complaint Chief Complaint  Patient presents with  . Laceration    HPI Monica Cox is a 26 y.o. female.  Patient is a 26 year old female who presents to the emergency department with a complaint of laceration to the right hand.  The patient states that she was picking up broken glass time to keep falling object from hurting children nearby when she sustained lacerations to the right hand. Patient is unsure of the date he is of her last tetanus. The patient denies any other lacerations. Patient denies being on any anticoagulation medications.      Past Medical History:  Diagnosis Date  . Anemia   . Asthma   . H/O trichomonas 08/08/2014   2015 pregnancy   . History of chlamydia 03/19/2016   2015 pregnancy   . Hypertension    pp htn  . Nausea 02/06/2016  . Reflux   . Smoker 02/06/2016  . Trichomonas infection 08-08-2014  . Vaginal Pap smear, abnormal   . Vertigo 09/21/2014   antivert rx 09/21/2014      Patient Active Problem List   Diagnosis Date Noted  . Depression, postpartum 10/11/2016    Class: Acute  . PROM (premature rupture of membranes) 10/04/2016  . Marijuana use 03/24/2016  . Depression with anxiety 03/19/2016  . Smoker 02/06/2016  . Preeclampsia in postpartum period 11/26/2014  . Heart murmur 09/21/2014  . Rh negative state in antepartum period 05/01/2014    Past Surgical History:  Procedure Laterality Date  . DILATION AND CURETTAGE OF UTERUS     MAB Dr. Despina Hidden performed  . ESOPHAGOGASTRODUODENOSCOPY ENDOSCOPY      OB History    Gravida Para Term Preterm AB Living   4 3 3  0 1 3   SAB TAB Ectopic Multiple Live Births   1 0 0 0 3       Home Medications    Prior to Admission medications   Medication Sig Start Date End Date Taking? Authorizing Provider  albuterol (PROVENTIL HFA;VENTOLIN HFA) 108 (90 Base) MCG/ACT inhaler Inhale 1-2  puffs into the lungs every 4 (four) hours as needed for wheezing or shortness of breath. Patient not taking: Reported on 03/12/2017 07/02/16   Cheral Marker, CNM  doxycycline (VIBRAMYCIN) 100 MG capsule Take 1 capsule (100 mg total) by mouth 2 (two) times daily. Patient not taking: Reported on 03/12/2017 01/07/17   Tilda Burrow, MD  escitalopram (LEXAPRO) 10 MG tablet Take 1 tablet (10 mg total) by mouth at bedtime. 03/12/17   Cresenzo-Dishmon, Scarlette Calico, CNM  norgestimate-ethinyl estradiol (ORTHO-CYCLEN,SPRINTEC,PREVIFEM) 0.25-35 MG-MCG tablet Take 1 tablet by mouth daily. 12/14/16   Tilda Burrow, MD  pantoprazole (PROTONIX) 40 MG tablet Take 1 tablet (40 mg total) by mouth daily. Patient not taking: Reported on 03/12/2017 10/12/16   Tilda Burrow, MD    Family History Family History  Problem Relation Age of Onset  . Ulcers Father   . Hypertension Father   . Hypertension Mother   . Ulcers Paternal Grandmother   . Diabetes Paternal Grandmother   . Ulcers Paternal Aunt   . Liver disease Neg Hx   . Colon cancer Neg Hx     Social History Social History  Substance Use Topics  . Smoking status: Former Smoker    Years: 3.00    Types: Cigars  . Smokeless tobacco: Never Used  .  Alcohol use No     Comment: occasionally; not now     Allergies   Patient has no known allergies.   Review of Systems Review of Systems  Constitutional: Negative for activity change.       All ROS Neg except as noted in HPI  HENT: Negative for nosebleeds.   Eyes: Negative for photophobia and discharge.  Respiratory: Negative for cough, shortness of breath and wheezing.   Cardiovascular: Negative for chest pain and palpitations.  Gastrointestinal: Negative for abdominal pain and blood in stool.  Genitourinary: Negative for dysuria, frequency and hematuria.  Musculoskeletal: Negative for arthralgias, back pain and neck pain.  Skin: Negative.   Neurological: Negative for dizziness, seizures and speech  difficulty.  Psychiatric/Behavioral: Negative for confusion and hallucinations.     Physical Exam Updated Vital Signs BP 118/76   Pulse (!) 113   Temp 98.8 F (37.1 C)   Resp 18   Ht 5' 3.6" (1.615 m)   Wt 81.1 kg (178 lb 12.8 oz)   LMP 04/12/2017   SpO2 100%   BMI 31.08 kg/m   Physical Exam  Constitutional: She is oriented to person, place, and time. She appears well-developed and well-nourished.  Non-toxic appearance.  HENT:  Head: Normocephalic.  Right Ear: Tympanic membrane and external ear normal.  Left Ear: Tympanic membrane and external ear normal.  Eyes: Pupils are equal, round, and reactive to light. EOM and lids are normal.  Neck: Normal range of motion. Neck supple. Carotid bruit is not present.  Cardiovascular: Normal rate, regular rhythm, normal heart sounds, intact distal pulses and normal pulses.   Pulmonary/Chest: Breath sounds normal. No respiratory distress.  Abdominal: Soft. Bowel sounds are normal. There is no tenderness. There is no guarding.  Musculoskeletal: Normal range of motion.  Patient is a 1.4 cm laceration of the palmar surface of the right hand between the ring finger and the middle finger. There is also a shallow laceration of the ring finger near the DIP joint.  There is full range of motion of all fingers. Full range of motion of the wrist. Capillary refill is less than 2 seconds.  Lymphadenopathy:       Head (right side): No submandibular adenopathy present.       Head (left side): No submandibular adenopathy present.    She has no cervical adenopathy.  Neurological: She is alert and oriented to person, place, and time. She has normal strength. No cranial nerve deficit or sensory deficit.  Skin: Skin is warm and dry.  Psychiatric: She has a normal mood and affect. Her speech is normal.  Nursing note and vitals reviewed.    ED Treatments / Results  Labs (all labs ordered are listed, but only abnormal results are displayed) Labs  Reviewed  POC URINE PREG, ED - Abnormal; Notable for the following:       Result Value   Preg Test, Ur POSITIVE (*)    All other components within normal limits    EKG  EKG Interpretation None       Radiology Dg Hand Complete Right  Result Date: 05/20/2017 CLINICAL DATA:  Recent lacerations from picking up glass, possible foreign body EXAM: RIGHT HAND - COMPLETE 3+ VIEW COMPARISON:  None. FINDINGS: No acute fracture or dislocation is noted. The soft tissues demonstrate mild irregularity consistent with the lacerations. No definitive radiopaque foreign bodies are seen. A nail declaration is noted on the fourth digit. IMPRESSION: No acute bony abnormality or definitive foreign body is seen.  Electronically Signed   By: Alcide Clever M.D.   On: 05/20/2017 21:54    Procedures .Marland KitchenLaceration Repair Date/Time: 05/20/2017 10:42 PM Performed by: Ivery Quale Authorized by: Ivery Quale   Consent:    Consent obtained:  Verbal   Consent given by:  Patient   Risks discussed:  Pain and poor wound healing   Alternatives discussed:  No treatment Anesthesia (see MAR for exact dosages):    Anesthesia method:  Local infiltration   Local anesthetic:  Lidocaine 1% w/o epi Laceration details:    Location:  Hand   Length (cm):  1.4 Repair type:    Repair type:  Simple Pre-procedure details:    Preparation:  Patient was prepped and draped in usual sterile fashion Exploration:    Hemostasis achieved with:  Direct pressure   Wound exploration: wound explored through full range of motion     Wound extent: no foreign bodies/material noted, no nerve damage noted and no tendon damage noted   Treatment:    Area cleansed with:  Betadine   Amount of cleaning:  Standard   Irrigation solution:  Sterile saline   Visualized foreign bodies/material removed: no   Skin repair:    Repair method:  Sutures   Suture size:  4-0   Suture material:  Nylon   Suture technique:  Simple interrupted   Number of  sutures:  4 Approximation:    Approximation:  Close Post-procedure details:    Dressing:  Sterile dressing   Patient tolerance of procedure:  Tolerated well, no immediate complications .Marland KitchenLaceration Repair Date/Time: 05/20/2017 10:45 PM Performed by: Ivery Quale Authorized by: Ivery Quale   Consent:    Consent obtained:  Verbal   Consent given by:  Patient   Risks discussed:  Infection Laceration details:    Location:  Finger   Finger location:  R ring finger   Length (cm):  1 Repair type:    Repair type:  Simple Pre-procedure details:    Preparation:  Patient was prepped and draped in usual sterile fashion Exploration:    Wound exploration: wound explored through full range of motion     Wound extent: no foreign bodies/material noted, no nerve damage noted and no tendon damage noted   Treatment:    Area cleansed with:  Betadine   Amount of cleaning:  Standard   Irrigation solution:  Sterile saline   Visualized foreign bodies/material removed: no   Skin repair:    Repair method:  Steri-Strips   Number of Steri-Strips:  1 Approximation:    Approximation:  Close Post-procedure details:    Dressing:  Open (no dressing)   Patient tolerance of procedure:  Tolerated well, no immediate complications   (including critical care time)  Medications Ordered in ED Medications  Tdap (BOOSTRIX) injection 0.5 mL (not administered)     Initial Impression / Assessment and Plan / ED Course  I have reviewed the triage vital signs and the nursing notes.  Pertinent labs & imaging results that were available during my care of the patient were reviewed by me and considered in my medical decision making (see chart for details).      Final Clinical Impressions(s) / ED Diagnoses Vital signs reviewed. Patient sustained lacerations to the right hand. X-ray of the hand is negative for any foreign body.  Patient was about to be given a tetanus when she has to have a pregnancy test  before receiving the tetanus. The pregnancy test is positive.   Patient delivered a baby  in 2015 and in 2017. Feels the tetanus is up-to-date.  The wounds to the hand were repaired without problem.  I've asked the patient to keep hand clean and dry. She is to have the sutures removed in 7 days. I've asked her to see her GYN specialist concerning her pregnancy.    Final diagnoses:  Laceration of right hand without foreign body, initial encounter  Laceration of right ring finger without foreign body without damage to nail, initial encounter  Pregnancy, unspecified gestational age    [place] Prescriptions New Prescriptions   No medications on file     Ivery Quale, Cordelia Poche 05/20/17 2252    Samuel Jester, DO 05/22/17 1726    Ivery Quale, PA-C 05/27/17 [id number]    Samuel Jester, DO 05/29/17 [phone]

## 2017-05-20 NOTE — ED Triage Notes (Signed)
Pt states that she was picking up broken glass, c/o lacerations to right hand,

## 2017-05-28 ENCOUNTER — Encounter (HOSPITAL_COMMUNITY): Payer: Self-pay | Admitting: Emergency Medicine

## 2017-05-28 ENCOUNTER — Emergency Department (HOSPITAL_COMMUNITY)
Admission: EM | Admit: 2017-05-28 | Discharge: 2017-05-29 | Disposition: A | Discharge status: Home / Self Care | Payer: Medicaid Other | Attending: Emergency Medicine | Admitting: Emergency Medicine

## 2017-05-28 DIAGNOSIS — O210 Mild hyperemesis gravidarum: Secondary | ICD-10-CM | POA: Insufficient documentation

## 2017-05-28 DIAGNOSIS — I10 Essential (primary) hypertension: Secondary | ICD-10-CM | POA: Insufficient documentation

## 2017-05-28 DIAGNOSIS — Z8759 Personal history of other complications of pregnancy, childbirth and the puerperium: Secondary | ICD-10-CM | POA: Diagnosis not present

## 2017-05-28 DIAGNOSIS — Z87891 Personal history of nicotine dependence: Secondary | ICD-10-CM | POA: Insufficient documentation

## 2017-05-28 DIAGNOSIS — R1084 Generalized abdominal pain: Secondary | ICD-10-CM | POA: Diagnosis not present

## 2017-05-28 DIAGNOSIS — R6883 Chills (without fever): Secondary | ICD-10-CM | POA: Insufficient documentation

## 2017-05-28 DIAGNOSIS — Z4802 Encounter for removal of sutures: Secondary | ICD-10-CM | POA: Diagnosis not present

## 2017-05-28 DIAGNOSIS — Z79899 Other long term (current) drug therapy: Secondary | ICD-10-CM | POA: Diagnosis not present

## 2017-05-28 DIAGNOSIS — Z3A00 Weeks of gestation of pregnancy not specified: Secondary | ICD-10-CM | POA: Diagnosis not present

## 2017-05-28 DIAGNOSIS — J45909 Unspecified asthma, uncomplicated: Secondary | ICD-10-CM | POA: Diagnosis not present

## 2017-05-28 LAB — URINALYSIS, ROUTINE W REFLEX MICROSCOPIC
GLUCOSE, UA: NEGATIVE mg/dL
HGB URINE DIPSTICK: NEGATIVE
KETONES UR: 20 mg/dL — AB
NITRITE: NEGATIVE
PH: 5 (ref 5.0–8.0)
Protein, ur: 100 mg/dL — AB
Specific Gravity, Urine: 1.032 — ABNORMAL HIGH (ref 1.005–1.030)

## 2017-05-28 LAB — CBC
HCT: 40.7 % (ref 36.0–46.0)
HEMOGLOBIN: 14 g/dL (ref 12.0–15.0)
MCH: 30.2 pg (ref 26.0–34.0)
MCHC: 34.4 g/dL (ref 30.0–36.0)
MCV: 87.9 fL (ref 78.0–100.0)
Platelets: 288 10*3/uL (ref 150–400)
RBC: 4.63 MIL/uL (ref 3.87–5.11)
RDW: 14.1 % (ref 11.5–15.5)
WBC: 6 10*3/uL (ref 4.0–10.5)

## 2017-05-28 LAB — HCG, QUANTITATIVE, PREGNANCY: hCG, Beta Chain, Quant, S: 78064 m[IU]/mL — ABNORMAL HIGH (ref <5)

## 2017-05-28 LAB — COMPREHENSIVE METABOLIC PANEL
ALK PHOS: 68 U/L (ref 38–126)
ALT: 47 U/L (ref 14–54)
ANION GAP: 11 (ref 5–15)
AST: 67 U/L — ABNORMAL HIGH (ref 15–41)
Albumin: 5 g/dL (ref 3.5–5.0)
BILIRUBIN TOTAL: 1.8 mg/dL — AB (ref 0.3–1.2)
BUN: 8 mg/dL (ref 6–20)
CALCIUM: 10 mg/dL (ref 8.9–10.3)
CO2: 24 mmol/L (ref 22–32)
Chloride: 104 mmol/L (ref 101–111)
Creatinine, Ser: 0.71 mg/dL (ref 0.44–1.00)
GFR calc non Af Amer: >60 mL/min (ref >60)
Glucose, Bld: 91 mg/dL (ref 65–99)
POTASSIUM: 3.7 mmol/L (ref 3.5–5.1)
SODIUM: 139 mmol/L (ref 135–145)
TOTAL PROTEIN: 8.8 g/dL — AB (ref 6.5–8.1)

## 2017-05-28 LAB — LIPASE, BLOOD: Lipase: 22 U/L (ref 11–51)

## 2017-05-28 LAB — PREGNANCY, URINE: Preg Test, Ur: POSITIVE — AB

## 2017-05-28 MED ORDER — SODIUM CHLORIDE 0.9 % IV BOLUS (SEPSIS)
2000.0000 mL | Freq: Once | INTRAVENOUS | Status: AC
  Administered 2017-05-28: 2000 mL via INTRAVENOUS

## 2017-05-28 MED ORDER — ONDANSETRON HCL 4 MG/2ML IJ SOLN
4.0000 mg | Freq: Once | INTRAMUSCULAR | Status: AC
  Administered 2017-05-28: 4 mg via INTRAVENOUS
  Filled 2017-05-28: qty 2

## 2017-05-28 MED ORDER — MECLIZINE HCL 12.5 MG PO TABS
50.0000 mg | ORAL_TABLET | Freq: Once | ORAL | Status: AC
  Administered 2017-05-29: 25 mg via ORAL
  Filled 2017-05-28: qty 4

## 2017-05-28 NOTE — ED Triage Notes (Signed)
Pt c/o vomiting x 6 days with abd pain. Pt states she found out she was pregnant last week and is supposed to see obgyn next week. Pt also needs stitches removed from left hand.

## 2017-05-28 NOTE — ED Provider Notes (Signed)
[facility]-EMER[facility]ENCY DEPT Provider Note   CSN: [id number] Arrival date & time: 05/28/17  2039     History   Chief Complaint Chief Complaint  Patient presents with  . Emesis During Pregnancy    HPI Monica Cox is a 26 y.o. female G5 P30 19 with history of preeclampsia and postpartum period presenting with 6 days of nausea/vomiting. Patient reports that she is unable to keep anything down since then. She found out that she was pregnant about a week ago while she was here for laceration repair. She is also here today for suture removal. She denies any purulent discharge, redness, swelling, pain or fever. She reports a mild squeezing discomfort in her lower abdomen which she has had before while she is pregnant. No focal or significant pain. No vaginal bleeding or abnormal discharge. She denies dysuria, hematuria, or other symptoms.  HPI  Past Medical History:  Diagnosis Date  . Anemia   . Asthma   . H/O trichomonas 08/08/2014   2015 pregnancy   . History of chlamydia 03/19/2016   2015 pregnancy   . Hypertension    pp htn  . Nausea 02/06/2016  . Reflux   . Smoker 02/06/2016  . Trichomonas infection 08-08-2014  . Vaginal Pap smear, abnormal   . Vertigo 09/21/2014   antivert rx 09/21/2014      Patient Active Problem List   Diagnosis Date Noted  . Depression, postpartum 10/11/2016    Class: Acute  . PROM (premature rupture of membranes) 10/04/2016  . Marijuana use 03/24/2016  . Depression with anxiety 03/19/2016  . Smoker 02/06/2016  . Preeclampsia in postpartum period 11/26/2014  . Heart murmur 09/21/2014  . Rh negative state in antepartum period 05/01/2014    Past Surgical History:  Procedure Laterality Date  . DILATION AND CURETTAGE OF UTERUS     [facility]AB Dr. Despina Hidden performed  . ESOPHAGOGASTRODUODENOSCOPY ENDOSCOPY      OB History    Gravida Para Term Preterm AB Living   5 3 3  0 1 3   SAB TAB Ectopic Multiple Live Births   1 0 0 0 3       Home Medications    Prior  to Admission medications   Medication Sig Start Date End Date Taking? Authorizing Provider  albuterol (PROVENTIL HFA;VENTOLIN HFA) 108 (90 Base) MCG/ACT inhaler Inhale 1-2 puffs into the lungs every 4 (four) hours as needed for wheezing or shortness of breath. Patient not taking: Reported on 03/12/2017 07/02/16   Cheral Marker, CNM  doxycycline (VIBRAMYCIN) 100 MG capsule Take 1 capsule (100 mg total) by mouth 2 (two) times daily. Patient not taking: Reported on 03/12/2017 01/07/17   Tilda Burrow, MD  Doxylamine-Pyridoxine 10-10 MG TBEC Take 20 mg by mouth at bedtime. Initial, 2 tablets (doxylamine succinate 10 mg/pyridoxine hydrochloride 10 mg per tablet) orally at bedtime on day 1 and 2; if symptoms persist, take 1 tablet in morning and 2 tablets at bedtime on day 3; if symptoms persist, may increase to MAX 4 tablets per day, administered as 1 tablet in the morning, 1 tablet in mid-afternoon and 2 tablets at bedtime 05/29/17   Mathews Robinsons B, PA-C  escitalopram (LEXAPRO) 10 MG tablet Take 1 tablet (10 mg total) by mouth at bedtime. 03/12/17   Cresenzo-Dishmon, Scarlette Calico, CNM  norgestimate-ethinyl estradiol (ORTHO-CYCLEN,SPRINTEC,PREVIFEM) 0.25-35 MG-MCG tablet Take 1 tablet by mouth daily. 12/14/16   Tilda Burrow, MD  pantoprazole (PROTONIX) 40 MG tablet Take 1 tablet (40 mg total) by mouth  daily. Patient not taking: Reported on 03/12/2017 10/12/16   Tilda Burrow, MD    Family History Family History  Problem Relation Age of Onset  . Ulcers Father   . Hypertension Father   . Hypertension Mother   . Ulcers Paternal Grandmother   . Diabetes Paternal Grandmother   . Ulcers Paternal Aunt   . Liver disease Neg Hx   . Colon cancer Neg Hx     Social History Social History  Substance Use Topics  . Smoking status: Former Smoker    Years: 3.00    Types: Cigars  . Smokeless tobacco: Never Used  . Alcohol use No     Comment: occasionally; not now     Allergies   Patient has no known  allergies.   Review of Systems Review of Systems  Constitutional: Positive for chills. Negative for fever.  HENT: Negative for ear pain.   Eyes: Negative for pain and visual disturbance.  Respiratory: Negative for cough, choking, chest tightness, shortness of breath, wheezing and stridor.   Cardiovascular: Negative for chest pain, palpitations and leg swelling.  Gastrointestinal: Positive for nausea and vomiting. Negative for abdominal distention, abdominal pain and diarrhea.       She reports mild abdominal cramping but nothing out of the ordinary for her while pregnant.  Genitourinary: Negative for difficulty urinating, dysuria, flank pain, hematuria, vaginal bleeding, vaginal discharge and vaginal pain.  Musculoskeletal: Negative for arthralgias, back pain, myalgias, neck pain and neck stiffness.  Skin: Negative for color change and rash.  Neurological: Negative for dizziness, seizures, syncope, weakness, light-headedness and headaches.     Physical Exam Updated Vital Signs BP 106/67 (BP Location: Right Arm)   Pulse 95   Temp 99.3 F (37.4 C) (Oral)   Resp 20   Ht 5\' 3"  (1.6 m)   Wt 80.7 kg (178 lb)   LMP 04/12/2017   SpO2 100%   BMI 31.53 kg/m   Physical Exam  Constitutional: She appears well-developed and well-nourished. No distress.  Afebrile, nontoxic-appearing, sitting comfortably in bed in no acute distress. Patient vomited once after arriving to her room.  HENT:  Head: Normocephalic and atraumatic.  Eyes: Conjunctivae are normal. Right eye exhibits no discharge. Left eye exhibits no discharge.  Neck: Normal range of motion.  Cardiovascular: Normal rate, regular rhythm and normal heart sounds.   No murmur heard. Pulmonary/Chest: Effort normal and breath sounds normal. No respiratory distress. She has no wheezes. She has no rales.  Abdominal: Soft. She exhibits no distension and no mass. There is no tenderness. There is no guarding.  Musculoskeletal: Normal range  of motion. She exhibits no edema.  Neurological: She is alert.  Skin: Skin is warm and dry. No rash noted. She is not diaphoretic. No erythema. No pallor.  Psychiatric: She has a normal mood and affect.  Nursing note and vitals reviewed.    ED Treatments / Results  Labs (all labs ordered are listed, but only abnormal results are displayed) Labs Reviewed  COMPREHENSIVE METABOLIC PANEL - Abnormal; Notable for the following:       Result Value   Total Protein 8.8 (*)    AST 67 (*)    Total Bilirubin 1.8 (*)    All other components within normal limits  URINALYSIS, ROUTINE W REFLEX MICROSCOPIC - Abnormal; Notable for the following:    Color, Urine AMBER (*)    APPearance CLOUDY (*)    Specific Gravity, Urine 1.032 (*)    Bilirubin Urine MODERATE (*)  Ketones, ur 20 (*)    Protein, ur 100 (*)    Leukocytes, UA SMALL (*)    Bacteria, UA FEW (*)    Squamous Epithelial / LPF 6-30 (*)    All other components within normal limits  PREGNANCY, URINE - Abnormal; Notable for the following:    Preg Test, Ur POSITIVE (*)    All other components within normal limits  HCG, QUANTITATIVE, PREGNANCY - Abnormal; Notable for the following:    hCG, Beta Chain, Quant, S 78,064 (*)    All other components within normal limits  LIPASE, BLOOD  CBC    EKG  EKG Interpretation None       Radiology No results found.  Procedures Procedures (including critical care time) SUTURE REMOVAL Performed by: Georgiana Shore  Consent: Verbal consent obtained. Patient identity confirmed: provided demographic data Time out: Immediately prior to procedure a "time out" was called to verify the correct patient, procedure, equipment, support staff and site/side marked as required.  Location details: right palm at the base of the ring finger  Wound Appearance: clean  Sutures/Staples Removed: 4 sutures  Facility: sutures placed in this facility Patient tolerance: Patient tolerated the procedure  well with no immediate complications.   Medications Ordered in ED Medications  sodium chloride 0.9 % bolus 2,000 mL (0 mLs Intravenous Stopped 05/29/17 0011)  ondansetron (ZOFRAN) injection 4 mg (4 mg Intravenous Given 05/28/17 2209)  meclizine (ANTIVERT) tablet 50 mg (25 mg Oral Given 05/29/17 0010)     Initial Impression / Assessment and Plan / ED Course  I have reviewed the triage vital signs and the nursing notes.  Pertinent labs & imaging results that were available during my care of the patient were reviewed by me and considered in my medical decision making (see chart for details).     Patient presenting for suture removal and nausea vomiting for the past 6 days. Patient was given antiemetic and 2 L IV fluids.  Patient reported improvement but persistent nausea. She had no more episodes of vomiting while observed  for over 2 hours. She was comfortably lying in bed with spouse watching TV on reassessments.  Tried meclizine and patient reported significant improvement.  She was afebrile, nontoxic, normal blood pressure, well-appearing and significantly improved while in the emergency department.  No complaints or symptoms prior to discharge. Discharge home with close follow-up with OB/GYN and symptomatic relief for nausea and vomiting in pregnancy.  Provided patient education on diet choices and prevention of nausea vomiting.  Discussed strict return precautions and advised to return to the emergency department if experiencing any new or worsening symptoms. Instructions were understood and patient agreed with discharge plan.  Final Clinical Impressions(s) / ED Diagnoses   Final diagnoses:  Hyperemesis gravidarum  Visit for suture removal    New Prescriptions Discharge Medication List as of 05/29/2017 12:26 AM    START taking these medications   Details  Doxylamine-Pyridoxine 10-10 MG TBEC Take 20 mg by mouth at bedtime. Initial, 2 tablets (doxylamine succinate 10  mg/pyridoxine hydrochloride 10 mg per tablet) orally at bedtime on day 1 and 2; if symptoms persist, take 1 tablet in morning and 2 tablets at bedtime on day 3; if symptoms persi st, may increase to MAX 4 tablets per day, administered as 1 tablet in the morning, 1 tablet in mid-afternoon and 2 tablets at bedtime, Starting Fri 05/29/2017, Print         [place], [place] B, PA-C 05/29/17 [phone]  Donnetta Hutching, MD 06/04/17 [phone]

## 2017-05-29 MED ORDER — DOXYLAMINE-PYRIDOXINE 10-10 MG PO TBEC: 20.0000 mg | DELAYED_RELEASE_TABLET | Freq: Every day | ORAL | 0 refills | Status: DC

## 2017-05-29 NOTE — Discharge Instructions (Signed)
As discussed, follow the instructions provided in this discharge summary regarding diet with nausea/vomiting in pregnancy.  Start reintroducing foods slowly beginning with fluids only, broths, soups, and bland foods. Follow-up with the OB/GYN.  Return if symptoms worsen, you experience persistent vomiting, feel lightheaded, unable to eat or drink or any other new concerning symptoms in the meantime.

## 2017-06-04 ENCOUNTER — Ambulatory Visit (INDEPENDENT_AMBULATORY_CARE_PROVIDER_SITE_OTHER): Payer: Medicaid Other | Admitting: Obstetrics and Gynecology

## 2017-06-04 ENCOUNTER — Other Ambulatory Visit: Payer: Self-pay | Admitting: Adult Health

## 2017-06-04 ENCOUNTER — Encounter: Payer: Self-pay | Admitting: Obstetrics and Gynecology

## 2017-06-04 ENCOUNTER — Ambulatory Visit: Payer: PRIVATE HEALTH INSURANCE | Admitting: Women's Health

## 2017-06-04 DIAGNOSIS — Z640 Problems related to unwanted pregnancy: Secondary | ICD-10-CM

## 2017-06-04 DIAGNOSIS — Z8759 Personal history of other complications of pregnancy, childbirth and the puerperium: Secondary | ICD-10-CM

## 2017-06-04 DIAGNOSIS — Z3A00 Weeks of gestation of pregnancy not specified: Secondary | ICD-10-CM | POA: Diagnosis not present

## 2017-06-04 DIAGNOSIS — O1495 Unspecified pre-eclampsia, complicating the puerperium: Secondary | ICD-10-CM | POA: Diagnosis not present

## 2017-06-04 MED ORDER — PROMETHAZINE HCL 25 MG RE SUPP: 25.0000 mg | Freq: Four times a day (QID) | RECTAL | 0 refills | Status: DC | PRN

## 2017-06-04 MED ORDER — PROMETHAZINE HCL 12.5 MG PO TABS: 12.5000 mg | ORAL_TABLET | Freq: Four times a day (QID) | ORAL | 0 refills | Status: DC | PRN

## 2017-06-04 NOTE — Assessment & Plan Note (Addendum)
Has happened several times will ned to be considered for BP meds at d/c for prevention. Lost 38 pounds 240-->202 in response to diuretics.and bp meds.

## 2017-06-04 NOTE — Progress Notes (Addendum)
Patient ID: Monica PeckBriana N Pickard, female   DOB: 1991/01/19, 26 y.o.   MRN: [id number]   [facility]  [facility]           Patient name: Monica PeckBriana N Pickard MRN [id number]  Date of birth: 1991/01/19  CC & HPI:  Monica PeckBriana N Pickard is a 26 y.o. female presenting today for follow up from [facility] visit where she was informed that she had a positive pregnancy test. Pt notes associated nausea and vomiting. Pt reports that she has a hx of postpartum pre-eclampsia where following delivery she has to check back in for diuretics. She notes that following her last delivery, she lost 38 lbs with treatment of diuretic. Pt reports that she is here today to discuss her sterilization options following pregnancy. Pt denies any other symptoms.  Extensive dialogue with patient and her mother. No  new issues Pt mother voices concern for the patient mental health during pregnancies. Pt mother would like for the patient to be evaluated during the pregnancy for the patient mental health issues.   ROS:  ROS  +nausea +vomiting  All systems are negative except as noted in the HPI and PMH.     Pertinent History Reviewed:   Reviewed: Significant for nausea Medical         Past Medical History:  Diagnosis Date  . Anemia   . Asthma   . H/O trichomonas 08/08/2014   2015 pregnancy   . History of chlamydia 03/19/2016   2015 pregnancy   . Hypertension    pp htn  . Nausea 02/06/2016  . Reflux   . Smoker 02/06/2016  . Trichomonas infection 08-08-2014  . Vaginal Pap smear, abnormal   . Vertigo 09/21/2014   antivert rx 09/21/2014                                Surgical Hx:    Past Surgical History:  Procedure Laterality Date  . DILATION AND CURETTAGE OF UTERUS     [facility] Dr. Despina HiddenEure performed  . ESOPHAGOGASTRODUODENOSCOPY ENDOSCOPY     Medications: Reviewed & Updated - see associated section                       Current Outpatient Prescriptions:  .  acetaminophen (TYLENOL) 500 MG tablet, Take 1,000 mg by mouth  as needed., Disp: , Rfl:  .  albuterol (PROVENTIL HFA;VENTOLIN HFA) 108 (90 Base) MCG/ACT inhaler, Inhale 1-2 puffs into the lungs every 4 (four) hours as needed for wheezing or shortness of breath., Disp: 1 Inhaler, Rfl: 0   Social History: Reviewed -  reports that she has quit smoking. Her smoking use included Cigars. She quit after 3.00 years of use. She has never used smokeless tobacco.  Objective Findings:  Vitals: Blood pressure 120/72, pulse 64, height 5' 3.5" (1.613 m), weight 175 lb (79.4 kg), last menstrual period 04/13/2017, not currently breastfeeding.  Physical Examination: discussion only   Discussion: 1. Discussed with pt risks and benefits of postpartum pre-eclampsia  2. Will consider blood pressure medications at d/c for prevention of postpartum pre-eclampsia.   At end of discussion, pt had opportunity to ask questions and has no further questions at this time.   Specific discussion of postpartum pre-eclampsia as noted above. Greater than 50% was spent in counseling and coordination of care with the patient.   Total time greater than: 25 minutes.   Assessment &  Plan:   A:  1. Unplanned pregnancy, undesired 2. Ambivalent attitude about keeping pregnancy 3. Manipulative Lucia Gaskins agressive personality 4. History of hyperemesis requiring Phenergan for relief 5. History of postpartum preeclampsia with severe fluid retention requiring hospitalization and diuretics and magnesium sulfate P:  1. Follow up in 1-2 weeks for prenatal 1 visit if intending to keep pregnancy.  2 prescriptions for Phenergan suppositories and Phenergan tablets 12.5 mg sent in 3. We'll attempt to address pregnancy concerns once its certain that the patient will continue the pregnancy   By signing my name below, I, Soijett Blue, attest that this documentation has been prepared under the direction and in the presence of Tilda Burrow, MD. Electronically Signed: Soijett Blue, ED Scribe. 06/04/17.  1:08 PM.  I personally performed the services described in this documentation, which was SCRIBED in my presence. The recorded information has been reviewed and considered accurate. It has been edited as necessary during review. Tilda Burrow, MD

## 2017-06-11 ENCOUNTER — Other Ambulatory Visit: Payer: Self-pay

## 2017-06-12 ENCOUNTER — Other Ambulatory Visit: Payer: Self-pay | Admitting: Obstetrics and Gynecology

## 2017-06-12 ENCOUNTER — Other Ambulatory Visit: Payer: Self-pay | Admitting: Obstetrics & Gynecology

## 2017-06-12 DIAGNOSIS — O3680X0 Pregnancy with inconclusive fetal viability, not applicable or unspecified: Secondary | ICD-10-CM

## 2017-06-15 ENCOUNTER — Ambulatory Visit: Payer: Self-pay | Admitting: Obstetrics & Gynecology

## 2017-06-15 ENCOUNTER — Ambulatory Visit (INDEPENDENT_AMBULATORY_CARE_PROVIDER_SITE_OTHER): Payer: Medicaid Other

## 2017-06-15 DIAGNOSIS — Z3A09 9 weeks gestation of pregnancy: Secondary | ICD-10-CM | POA: Diagnosis not present

## 2017-06-15 DIAGNOSIS — O3680X0 Pregnancy with inconclusive fetal viability, not applicable or unspecified: Secondary | ICD-10-CM | POA: Diagnosis not present

## 2017-06-15 NOTE — Progress Notes (Signed)
US 9 wks,single IUP w/ys,pos fht 166 bpm,normal ovaries bilat,crl 25.2 mm,EDD 01/18/2018

## 2017-06-25 ENCOUNTER — Encounter: Payer: Self-pay | Admitting: Advanced Practice Midwife

## 2017-06-25 ENCOUNTER — Ambulatory Visit (INDEPENDENT_AMBULATORY_CARE_PROVIDER_SITE_OTHER): Payer: Medicaid Other | Admitting: Advanced Practice Midwife

## 2017-06-25 VITALS — BP 100/54 | HR 96 | Wt 183.0 lb

## 2017-06-25 DIAGNOSIS — O09299 Supervision of pregnancy with other poor reproductive or obstetric history, unspecified trimester: Secondary | ICD-10-CM | POA: Diagnosis not present

## 2017-06-25 DIAGNOSIS — Z3490 Encounter for supervision of normal pregnancy, unspecified, unspecified trimester: Secondary | ICD-10-CM | POA: Insufficient documentation

## 2017-06-25 DIAGNOSIS — Z3481 Encounter for supervision of other normal pregnancy, first trimester: Secondary | ICD-10-CM

## 2017-06-25 DIAGNOSIS — Z23 Encounter for immunization: Secondary | ICD-10-CM | POA: Diagnosis not present

## 2017-06-25 DIAGNOSIS — Z3A10 10 weeks gestation of pregnancy: Secondary | ICD-10-CM | POA: Diagnosis not present

## 2017-06-25 DIAGNOSIS — Z1389 Encounter for screening for other disorder: Secondary | ICD-10-CM

## 2017-06-25 DIAGNOSIS — Z331 Pregnant state, incidental: Secondary | ICD-10-CM

## 2017-06-25 DIAGNOSIS — Z3682 Encounter for antenatal screening for nuchal translucency: Secondary | ICD-10-CM

## 2017-06-25 LAB — POCT URINALYSIS DIPSTICK
Glucose, UA: NEGATIVE
LEUKOCYTES UA: NEGATIVE
NITRITE UA: NEGATIVE
PROTEIN UA: NEGATIVE
RBC UA: NEGATIVE

## 2017-06-25 NOTE — Progress Notes (Signed)
Subjective:    Monica Cox is a [id number] [redacted]w[redacted]d being seen today for her first obstetrical visit.  Her obstetrical history is significant for pp preX w/all pg, pulmonary edema w/last one.  Wants rx for HCTZ when delivered She's not BF unless it's a boy because she "wants her son to be spoiled and  Be a mama's boy"   Pregnancy history fully reviewed.  Patient reports no complaints.  Vitals:   06/25/17 1528  BP: (!) 100/54  Pulse: 96  Weight: 183 lb (83 kg)    HISTORY: OB History  Gravida Para Term Preterm AB Living  0 1 3  SAB TAB Ectopic Multiple Live Births  1 0 0 0 3    # Outcome Date GA Lbr Len/2nd Weight Sex Delivery Anes PTL Lv  5 Current           4 Term 10/05/16 [redacted]w[redacted]d / 00:20 8 lb 8.3 oz (3.864 kg) F Vag-Spont EPI  LIV  3 Term 11/19/14 [redacted]w[redacted]d 13:18 / 00:10 8 lb 9.4 oz (3.895 kg) F Vag-Spont EPI N LIV  2 Term 04/24/09 [redacted]w[redacted]d  7 lb 9 oz (3.43 kg) M Vag-Spont EPI N LIV     Birth Comments: immediate pp HTN  1 SAB              Past Medical History:  Diagnosis Date  . Anemia   . Asthma   . H/O trichomonas 08/08/2014   2015 pregnancy   . History of chlamydia 03/19/2016   2015 pregnancy   . Hypertension    pp htn  . Nausea 02/06/2016  . Reflux   . Smoker 02/06/2016  . Trichomonas infection 08-08-2014  . Vaginal Pap smear, abnormal   . Vertigo 09/21/2014   antivert rx 09/21/2014     Past Surgical History:  Procedure Laterality Date  . DILATION AND CURETTAGE OF UTERUS     MAB Dr. Despina Hidden performed  . ESOPHAGOGASTRODUODENOSCOPY ENDOSCOPY     Family History  Problem Relation Age of Onset  . Ulcers Father   . Hypertension Father   . Hypertension Mother   . Ulcers Paternal Grandmother   . Diabetes Paternal Grandmother   . Ulcers Paternal Aunt   . Liver disease Neg Hx   . Colon cancer Neg Hx      Exam                                      System:     Skin: normal coloration and turgor, no rashes    Neurologic: oriented, normal, normal mood   Extremities: normal strength, tone, and muscle mass   HEENT PERRLA   Mouth/Teeth mucous membranes moist, normal dentition   Neck supple and no masses   Cardiovascular: regular rate and rhythm   Respiratory:  appears well, vitals normal, no respiratory distress, acyanotic   Abdomen: soft, non-tender;  FHR: 160 [place]          Assessment:    Pregnancy: [id number] Patient Active Problem List   Diagnosis Date Noted  . Supervision of normal pregnancy 06/25/2017  . Depression, postpartum 10/11/2016    Class: Acute  . Marijuana use 03/24/2016  . Depression with anxiety 03/19/2016  . Smoker 02/06/2016  . Heart murmur 09/21/2014        Plan:     Initial labs drawn. ASA  at 12 weeks Continue prenatal vitamins  Problem  list reviewed and updated  Reviewed n/v relief measures and warning s/s to report  Reviewed recommended weight gain based on pre-gravid BMI  Encouraged well-balanced diet Genetic Screening discussed Integrated Screen: requested.  Ultrasound discussed; fetal survey: requested.  No Follow-up on file.  CRESENZO-DISHMAN,Nikita Humble 06/25/2017

## 2017-06-25 NOTE — Patient Instructions (Signed)
 First Trimester of Pregnancy The first trimester of pregnancy is from week 1 until the end of week 12 (months 1 through 3). A week after a sperm fertilizes an egg, the egg will implant on the wall of the uterus. This embryo will begin to develop into a baby. Genes from you and your partner are forming the baby. The female genes determine whether the baby is a boy or a girl. At 6-8 weeks, the eyes and face are formed, and the heartbeat can be seen on ultrasound. At the end of 12 weeks, all the baby's organs are formed.  Now that you are pregnant, you will want to do everything you can to have a healthy baby. Two of the most important things are to get good prenatal care and to follow your health care provider's instructions. Prenatal care is all the medical care you receive before the baby's birth. This care will help prevent, find, and treat any problems during the pregnancy and childbirth. BODY CHANGES Your body goes through many changes during pregnancy. The changes vary from woman to woman.   You may gain or lose a couple of pounds at first.  You may feel sick to your stomach (nauseous) and throw up (vomit). If the vomiting is uncontrollable, call your health care provider.  You may tire easily.  You may develop headaches that can be relieved by medicines approved by your health care provider.  You may urinate more often. Painful urination may mean you have a bladder infection.  You may develop heartburn as a result of your pregnancy.  You may develop constipation because certain hormones are causing the muscles that push waste through your intestines to slow down.  You may develop hemorrhoids or swollen, bulging veins (varicose veins).  Your breasts may begin to grow larger and become tender. Your nipples may stick out more, and the tissue that surrounds them (areola) may become darker.  Your gums may bleed and may be sensitive to brushing and flossing.  Dark spots or blotches  (chloasma, mask of pregnancy) may develop on your face. This will likely fade after the baby is born.  Your menstrual periods will stop.  You may have a loss of appetite.  You may develop cravings for certain kinds of food.  You may have changes in your emotions from day to day, such as being excited to be pregnant or being concerned that something may go wrong with the pregnancy and baby.  You may have more vivid and strange dreams.  You may have changes in your hair. These can include thickening of your hair, rapid growth, and changes in texture. Some women also have hair loss during or after pregnancy, or hair that feels dry or thin. Your hair will most likely return to normal after your baby is born. WHAT TO EXPECT AT YOUR PRENATAL VISITS During a routine prenatal visit:  You will be weighed to make sure you and the baby are growing normally.  Your blood pressure will be taken.  Your abdomen will be measured to track your baby's growth.  The fetal heartbeat will be listened to starting around week 10 or 12 of your pregnancy.  Test results from any previous visits will be discussed. Your health care provider may ask you:  How you are feeling.  If you are feeling the baby move.  If you have had any abnormal symptoms, such as leaking fluid, bleeding, severe headaches, or abdominal cramping.  If you have any questions. Other   tests that may be performed during your first trimester include:  Blood tests to find your blood type and to check for the presence of any previous infections. They will also be used to check for low iron levels (anemia) and Rh antibodies. Later in the pregnancy, blood tests for diabetes will be done along with other tests if problems develop.  Urine tests to check for infections, diabetes, or protein in the urine.  An ultrasound to confirm the proper growth and development of the baby.  An amniocentesis to check for possible genetic problems.  Fetal  screens for spina bifida and Down syndrome.  You may need other tests to make sure you and the baby are doing well. HOME CARE INSTRUCTIONS  Medicines  Follow your health care provider's instructions regarding medicine use. Specific medicines may be either safe or unsafe to take during pregnancy.  Take your prenatal vitamins as directed.  If you develop constipation, try taking a stool softener if your health care provider approves. Diet  Eat regular, well-balanced meals. Choose a variety of foods, such as meat or vegetable-based protein, fish, milk and low-fat dairy products, vegetables, fruits, and whole grain breads and cereals. Your health care provider will help you determine the amount of weight gain that is right for you.  Avoid raw meat and uncooked cheese. These carry germs that can cause birth defects in the baby.  Eating four or five small meals rather than three large meals a day may help relieve nausea and vomiting. If you start to feel nauseous, eating a few soda crackers can be helpful. Drinking liquids between meals instead of during meals also seems to help nausea and vomiting.  If you develop constipation, eat more high-fiber foods, such as fresh vegetables or fruit and whole grains. Drink enough fluids to keep your urine clear or pale yellow. Activity and Exercise  Exercise only as directed by your health care provider. Exercising will help you:  Control your weight.  Stay in shape.  Be prepared for labor and delivery.  Experiencing pain or cramping in the lower abdomen or low back is a good sign that you should stop exercising. Check with your health care provider before continuing normal exercises.  Try to avoid standing for long periods of time. Move your legs often if you must stand in one place for a long time.  Avoid heavy lifting.  Wear low-heeled shoes, and practice good posture.  You may continue to have sex unless your health care provider directs you  otherwise. Relief of Pain or Discomfort  Wear a good support bra for breast tenderness.   Take warm sitz baths to soothe any pain or discomfort caused by hemorrhoids. Use hemorrhoid cream if your health care provider approves.   Rest with your legs elevated if you have leg cramps or low back pain.  If you develop varicose veins in your legs, wear support hose. Elevate your feet for 15 minutes, 3-4 times a day. Limit salt in your diet. Prenatal Care  Schedule your prenatal visits by the twelfth week of pregnancy. They are usually scheduled monthly at first, then more often in the last 2 months before delivery.  Write down your questions. Take them to your prenatal visits.  Keep all your prenatal visits as directed by your health care provider. Safety  Wear your seat belt at all times when driving.  Make a list of emergency phone numbers, including numbers for family, friends, the hospital, and police and fire departments. General   Tips  Ask your health care provider for a referral to a local prenatal education class. Begin classes no later than at the beginning of month 6 of your pregnancy.  Ask for help if you have counseling or nutritional needs during pregnancy. Your health care provider can offer advice or refer you to specialists for help with various needs.  Do not use hot tubs, steam rooms, or saunas.  Do not douche or use tampons or scented sanitary pads.  Do not cross your legs for long periods of time.  Avoid cat litter boxes and soil used by cats. These carry germs that can cause birth defects in the baby and possibly loss of the fetus by miscarriage or stillbirth.  Avoid all smoking, herbs, alcohol, and medicines not prescribed by your health care provider. Chemicals in these affect the formation and growth of the baby.  Schedule a dentist appointment. At home, brush your teeth with a soft toothbrush and be gentle when you floss. SEEK MEDICAL CARE IF:   You have  dizziness.  You have mild pelvic cramps, pelvic pressure, or nagging pain in the abdominal area.  You have persistent nausea, vomiting, or diarrhea.  You have a bad smelling vaginal discharge.  You have pain with urination.  You notice increased swelling in your face, hands, legs, or ankles. SEEK IMMEDIATE MEDICAL CARE IF:   You have a fever.  You are leaking fluid from your vagina.  You have spotting or bleeding from your vagina.  You have severe abdominal cramping or pain.  You have rapid weight gain or loss.  You vomit blood or material that looks like coffee grounds.  You are exposed to German measles and have never had them.  You are exposed to fifth disease or chickenpox.  You develop a severe headache.  You have shortness of breath.  You have any kind of trauma, such as from a fall or a car accident. Document Released: 09/16/2001 Document Revised: 02/06/2014 Document Reviewed: 08/02/2013 [employer] Patient Information 2015 [employer]. This information is not intended to replace advice given to you by your health care provider. Make sure you discuss any questions you have with your health care provider.   Nausea & Vomiting  Have saltine crackers or pretzels by your bed and eat a few bites before you raise your head out of bed in the morning  Eat small frequent meals throughout the day instead of large meals  Drink plenty of fluids throughout the day to stay hydrated, just don't drink a lot of fluids with your meals.  This can make your stomach fill up faster making you feel sick  Do not brush your teeth right after you eat  Products with real ginger are good for nausea, like ginger ale and ginger hard candy Make sure it says made with real ginger!  Sucking on sour candy like lemon heads is also good for nausea  If your prenatal vitamins make you nauseated, take them at night so you will sleep through the nausea  Sea Bands  If you feel like you need  medicine for the nausea & vomiting please let us know  If you are unable to keep any fluids or food down please let us know   Constipation  Drink plenty of fluid, preferably water, throughout the day  Eat foods high in fiber such as fruits, vegetables, and grains  Exercise, such as walking, is a good way to keep your bowels regular  Drink warm fluids, especially warm   prune juice, or decaf coffee  Eat a 1/2 cup of real oatmeal (not instant), 1/2 cup applesauce, and 1/2-1 cup warm prune juice every day  If needed, you may take Colace (docusate sodium) stool softener once or twice a day to help keep the stool soft. If you are pregnant, wait until you are out of your first trimester (12-14 weeks of pregnancy)  If you still are having problems with constipation, you may take Miralax once daily as needed to help keep your bowels regular.  If you are pregnant, wait until you are out of your first trimester (12-14 weeks of pregnancy)  Safe Medications in Pregnancy   Acne: Benzoyl Peroxide Salicylic Acid  Backache/Headache: Tylenol: 2 regular strength every 4 hours OR              2 Extra strength every 6 hours  Colds/Coughs/Allergies: Benadryl (alcohol free) 25 mg every 6 hours as needed Breath right strips Claritin Cepacol throat lozenges Chloraseptic throat spray Cold-Eeze- up to three times per day Cough drops, alcohol free Flonase (by prescription only) Guaifenesin Mucinex Robitussin DM (plain only, alcohol free) Saline nasal spray/drops Sudafed (pseudoephedrine) & Actifed ** use only after [redacted] weeks gestation and if you do not have high blood pressure Tylenol Vicks Vaporub Zinc lozenges Zyrtec   Constipation: Colace Ducolax suppositories Fleet enema Glycerin suppositories Metamucil Milk of magnesia Miralax Senokot Smooth move tea  Diarrhea: Kaopectate Imodium A-D  *NO pepto Bismol  Hemorrhoids: Anusol Anusol HC Preparation  H Tucks  Indigestion: Tums Maalox Mylanta Zantac  Pepcid  Insomnia: Benadryl (alcohol free) 25mg every 6 hours as needed Tylenol PM Unisom, no Gelcaps  Leg Cramps: Tums MagGel  Nausea/Vomiting:  Bonine Dramamine Emetrol Ginger extract Sea bands Meclizine  Nausea medication to take during pregnancy:  Unisom (doxylamine succinate 25 mg tablets) Take one tablet daily at bedtime. If symptoms are not adequately controlled, the dose can be increased to a maximum recommended dose of two tablets daily (1/2 tablet in the morning, 1/2 tablet mid-afternoon and one at bedtime). Vitamin B6 100mg tablets. Take one tablet twice a day (up to 200 mg per day).  Skin Rashes: Aveeno products Benadryl cream or 25mg every 6 hours as needed Calamine Lotion 1% cortisone cream  Yeast infection: Gyne-lotrimin 7 Monistat 7   **If taking multiple medications, please check labels to avoid duplicating the same active ingredients **take medication as directed on the label ** Do not exceed 4000 mg of tylenol in 24 hours **Do not take medications that contain aspirin or ibuprofen      

## 2017-06-26 ENCOUNTER — Other Ambulatory Visit: Payer: Self-pay | Admitting: Obstetrics and Gynecology

## 2017-06-26 LAB — PMP SCREEN PROFILE (10S), URINE
AMPHETAMINE SCREEN URINE: NEGATIVE ng/mL
BARBITURATE SCREEN URINE: NEGATIVE ng/mL
BENZODIAZEPINE SCREEN, URINE: NEGATIVE ng/mL
CANNABINOIDS UR QL SCN: POSITIVE ng/mL — AB
CREATININE(CRT), U: 327.2 mg/dL — AB (ref 20.0–300.0)
Cocaine (Metab) Scrn, Ur: NEGATIVE ng/mL
METHADONE SCREEN, URINE: NEGATIVE ng/mL
OXYCODONE+OXYMORPHONE UR QL SCN: NEGATIVE ng/mL
Opiate Scrn, Ur: NEGATIVE ng/mL
PH UR, DRUG SCRN: 5.5 (ref 4.5–8.9)
PHENCYCLIDINE QUANTITATIVE URINE: NEGATIVE ng/mL
PROPOXYPHENE SCREEN URINE: NEGATIVE ng/mL

## 2017-06-26 LAB — CBC
HEMOGLOBIN: 12 g/dL (ref 11.1–15.9)
Hematocrit: 36.8 % (ref 34.0–46.6)
MCH: 30 pg (ref 26.6–33.0)
MCHC: 32.6 g/dL (ref 31.5–35.7)
MCV: 92 fL (ref 79–97)
PLATELETS: 285 10*3/uL (ref 150–379)
RBC: 4 x10E6/uL (ref 3.77–5.28)
RDW: 14.7 % (ref 12.3–15.4)
WBC: 7 10*3/uL (ref 3.4–10.8)

## 2017-06-26 LAB — HEPATITIS B SURFACE ANTIGEN: HEP B S AG: NEGATIVE

## 2017-06-26 LAB — URINALYSIS, ROUTINE W REFLEX MICROSCOPIC
BILIRUBIN UA: NEGATIVE
Glucose, UA: NEGATIVE
LEUKOCYTES UA: NEGATIVE
Nitrite, UA: NEGATIVE
PH UA: 5.5 (ref 5.0–7.5)
PROTEIN UA: NEGATIVE
RBC UA: NEGATIVE
Urobilinogen, Ur: 0.2 mg/dL (ref 0.2–1.0)

## 2017-06-26 LAB — ANTIBODY SCREEN: ANTIBODY SCREEN: NEGATIVE

## 2017-06-26 LAB — RPR: RPR Ser Ql: NONREACTIVE

## 2017-06-26 LAB — VARICELLA ZOSTER ANTIBODY, IGG: Varicella zoster IgG: 1087 index (ref >165)

## 2017-06-26 LAB — HIV ANTIBODY (ROUTINE TESTING W REFLEX): HIV SCREEN 4TH GENERATION: NONREACTIVE

## 2017-06-29 ENCOUNTER — Encounter: Payer: Self-pay | Admitting: *Deleted

## 2017-06-30 ENCOUNTER — Encounter: Payer: Self-pay | Admitting: Advanced Practice Midwife

## 2017-07-02 ENCOUNTER — Other Ambulatory Visit: Payer: Self-pay | Admitting: Obstetrics and Gynecology

## 2017-07-02 DIAGNOSIS — Z3682 Encounter for antenatal screening for nuchal translucency: Secondary | ICD-10-CM

## 2017-07-08 ENCOUNTER — Other Ambulatory Visit: Payer: Self-pay | Admitting: Obstetrics and Gynecology

## 2017-07-09 ENCOUNTER — Telehealth: Payer: Self-pay | Admitting: *Deleted

## 2017-07-09 MED ORDER — PROMETHAZINE HCL 12.5 MG PO TABS: 12.5000 mg | ORAL_TABLET | Freq: Four times a day (QID) | ORAL | 0 refills | Status: DC | PRN

## 2017-07-09 NOTE — Telephone Encounter (Signed)
Patient requesting refill on phenergan. Verbal order from [facility] to refill.

## 2017-07-13 ENCOUNTER — Encounter: Payer: Self-pay | Admitting: Women's Health

## 2017-07-13 ENCOUNTER — Ambulatory Visit (INDEPENDENT_AMBULATORY_CARE_PROVIDER_SITE_OTHER): Payer: Medicaid Other

## 2017-07-13 ENCOUNTER — Ambulatory Visit (INDEPENDENT_AMBULATORY_CARE_PROVIDER_SITE_OTHER): Payer: Medicaid Other | Admitting: Women's Health

## 2017-07-13 VITALS — BP 100/60 | HR 80 | Wt 184.0 lb

## 2017-07-13 DIAGNOSIS — Z3481 Encounter for supervision of other normal pregnancy, first trimester: Secondary | ICD-10-CM

## 2017-07-13 DIAGNOSIS — O99341 Other mental disorders complicating pregnancy, first trimester: Secondary | ICD-10-CM

## 2017-07-13 DIAGNOSIS — F418 Other specified anxiety disorders: Secondary | ICD-10-CM

## 2017-07-13 DIAGNOSIS — Z1379 Encounter for other screening for genetic and chromosomal anomalies: Secondary | ICD-10-CM

## 2017-07-13 DIAGNOSIS — Z331 Pregnant state, incidental: Secondary | ICD-10-CM

## 2017-07-13 DIAGNOSIS — F1290 Cannabis use, unspecified, uncomplicated: Secondary | ICD-10-CM

## 2017-07-13 DIAGNOSIS — Z1389 Encounter for screening for other disorder: Secondary | ICD-10-CM

## 2017-07-13 DIAGNOSIS — O99321 Drug use complicating pregnancy, first trimester: Secondary | ICD-10-CM

## 2017-07-13 DIAGNOSIS — Z3682 Encounter for antenatal screening for nuchal translucency: Secondary | ICD-10-CM

## 2017-07-13 DIAGNOSIS — Z3A13 13 weeks gestation of pregnancy: Secondary | ICD-10-CM

## 2017-07-13 DIAGNOSIS — Z3401 Encounter for supervision of normal first pregnancy, first trimester: Secondary | ICD-10-CM

## 2017-07-13 DIAGNOSIS — F329 Major depressive disorder, single episode, unspecified: Secondary | ICD-10-CM

## 2017-07-13 LAB — POCT URINALYSIS DIPSTICK
GLUCOSE UA: NEGATIVE
KETONES UA: NEGATIVE
LEUKOCYTES UA: NEGATIVE
Nitrite, UA: NEGATIVE
Protein, UA: NEGATIVE
RBC UA: NEGATIVE

## 2017-07-13 MED ORDER — ESCITALOPRAM OXALATE 10 MG PO TABS: 10.0000 mg | ORAL_TABLET | Freq: Every day | ORAL | 6 refills | Status: DC

## 2017-07-13 NOTE — Patient Instructions (Addendum)
For Dizzy Spells:   This is usually related to either your blood sugar or your blood pressure dropping  Make sure you are staying well hydrated and drinking enough water so that your urine is clear  Eat small frequent meals and snacks containing protein (meat, eggs, nuts, cheese) so that your blood sugar doesn't drop  If you do get dizzy, sit/lay down and get you something to drink and a snack containing protein- you will usually start feeling better in 10-20 minutes    Second Trimester of Pregnancy The second trimester is from week 14 through week 27 (months 4 through 6). The second trimester is often a time when you feel your best. Your body has adjusted to being pregnant, and you begin to feel better physically. Usually, morning sickness has lessened or quit completely, you may have more energy, and you may have an increase in appetite. The second trimester is also a time when the fetus is growing rapidly. At the end of the sixth month, the fetus is about 9 inches long and weighs about 1 pounds. You will likely begin to feel the baby move (quickening) between 16 and 20 weeks of pregnancy. Body changes during your second trimester Your body continues to go through many changes during your second trimester. The changes vary from woman to woman.  Your weight will continue to increase. You will notice your lower abdomen bulging out.  You may begin to get stretch marks on your hips, abdomen, and breasts.  You may develop headaches that can be relieved by medicines. The medicines should be approved by your health care provider.  You may urinate more often because the fetus is pressing on your bladder.  You may develop or continue to have heartburn as a result of your pregnancy.  You may develop constipation because certain hormones are causing the muscles that push waste through your intestines to slow down.  You may develop hemorrhoids or swollen, bulging veins (varicose veins).  You may  have back pain. This is caused by:  Weight gain.  Pregnancy hormones that are relaxing the joints in your pelvis.  A shift in weight and the muscles that support your balance.  Your breasts will continue to grow and they will continue to become tender.  Your gums may bleed and may be sensitive to brushing and flossing.  Dark spots or blotches (chloasma, mask of pregnancy) may develop on your face. This will likely fade after the baby is born.  A dark line from your belly button to the pubic area (linea nigra) may appear. This will likely fade after the baby is born.  You may have changes in your hair. These can include thickening of your hair, rapid growth, and changes in texture. Some women also have hair loss during or after pregnancy, or hair that feels dry or thin. Your hair will most likely return to normal after your baby is born. What to expect at prenatal visits During a routine prenatal visit:  You will be weighed to make sure you and the fetus are growing normally.  Your blood pressure will be taken.  Your abdomen will be measured to track your baby's growth.  The fetal heartbeat will be listened to.  Any test results from the previous visit will be discussed. Your health care provider may ask you:  How you are feeling.  If you are feeling the baby move.  If you have had any abnormal symptoms, such as leaking fluid, bleeding, severe headaches, or   headaches, or abdominal cramping.  If you are using any tobacco products, including cigarettes, chewing tobacco, and electronic cigarettes.  If you have any questions.  Other tests that may be performed during your second trimester include:  Blood tests that check for: ? Low iron levels (anemia). ? High blood sugar that affects pregnant women (gestational diabetes) between 64 and 28 weeks. ? Rh antibodies. This is to check for a protein on red blood cells (Rh factor).  Urine tests to check for infections, diabetes, or protein  in the urine.  An ultrasound to confirm the proper growth and development of the baby.  An amniocentesis to check for possible genetic problems.  Fetal screens for spina bifida and Down syndrome.  HIV (human immunodeficiency virus) testing. Routine prenatal testing includes screening for HIV, unless you choose not to have this test.  Follow these instructions at home: Medicines  Follow your health care provider's instructions regarding medicine use. Specific medicines may be either safe or unsafe to take during pregnancy.  Take a prenatal vitamin that contains at least 600 micrograms (mcg) of folic acid.  If you develop constipation, try taking a stool softener if your health care provider approves. Eating and drinking  Eat a balanced diet that includes fresh fruits and vegetables, whole grains, good sources of protein such as meat, eggs, or tofu, and low-fat dairy. Your health care provider will help you determine the amount of weight gain that is right for you.  Avoid raw meat and uncooked cheese. These carry germs that can cause birth defects in the baby.  If you have low calcium intake from food, talk to your health care provider about whether you should take a daily calcium supplement.  Limit foods that are high in fat and processed sugars, such as fried and sweet foods.  To prevent constipation: ? Drink enough fluid to keep your urine clear or pale yellow. ? Eat foods that are high in fiber, such as fresh fruits and vegetables, whole grains, and beans. Activity  Exercise only as directed by your health care provider. Most women can continue their usual exercise routine during pregnancy. Try to exercise for 30 minutes at least 5 days a week. Stop exercising if you experience uterine contractions.  Avoid heavy lifting, wear low heel shoes, and practice good posture.  A sexual relationship may be continued unless your health care provider directs you otherwise. Relieving pain  and discomfort  Wear a good support bra to prevent discomfort from breast tenderness.  Take warm sitz baths to soothe any pain or discomfort caused by hemorrhoids. Use hemorrhoid cream if your health care provider approves.  Rest with your legs elevated if you have leg cramps or low back pain.  If you develop varicose veins, wear support hose. Elevate your feet for 15 minutes, 3-4 times a day. Limit salt in your diet. Prenatal Care  Write down your questions. Take them to your prenatal visits.  Keep all your prenatal visits as told by your health care provider. This is important. Safety  Wear your seat belt at all times when driving.  Make a list of emergency phone numbers, including numbers for family, friends, the hospital, and police and fire departments. General instructions  Ask your health care provider for a referral to a local prenatal education class. Begin classes no later than the beginning of month 6 of your pregnancy.  Ask for help if you have counseling or nutritional needs during pregnancy. Your health care provider can offer  advice or refer you to specialists for help with various needs.  Do not use hot tubs, steam rooms, or saunas.  Do not douche or use tampons or scented sanitary pads.  Do not cross your legs for long periods of time.  Avoid cat litter boxes and soil used by cats. These carry germs that can cause birth defects in the baby and possibly loss of the fetus by miscarriage or stillbirth.  Avoid all smoking, herbs, alcohol, and unprescribed drugs. Chemicals in these products can affect the formation and growth of the baby.  Do not use any products that contain nicotine or tobacco, such as cigarettes and e-cigarettes. If you need help quitting, ask your health care provider.  Visit your dentist if you have not gone yet during your pregnancy. Use a soft toothbrush to brush your teeth and be gentle when you floss. Contact a health care provider  if:  You have dizziness.  You have mild pelvic cramps, pelvic pressure, or nagging pain in the abdominal area.  You have persistent nausea, vomiting, or diarrhea.  You have a bad smelling vaginal discharge.  You have pain when you urinate. Get help right away if:  You have a fever.  You are leaking fluid from your vagina.  You have spotting or bleeding from your vagina.  You have severe abdominal cramping or pain.  You have rapid weight gain or weight loss.  You have shortness of breath with chest pain.  You notice sudden or extreme swelling of your face, hands, ankles, feet, or legs.  You have not felt your baby move in over an hour.  You have severe headaches that do not go away when you take medicine.  You have vision changes. Summary  The second trimester is from week 14 through week 27 (months 4 through 6). It is also a time when the fetus is growing rapidly.  Your body goes through many changes during pregnancy. The changes vary from woman to woman.  Avoid all smoking, herbs, alcohol, and unprescribed drugs. These chemicals affect the formation and growth your baby.  Do not use any tobacco products, such as cigarettes, chewing tobacco, and e-cigarettes. If you need help quitting, ask your health care provider.  Contact your health care provider if you have any questions. Keep all prenatal visits as told by your health care provider. This is important. This information is not intended to replace advice given to you by your health care provider. Make sure you discuss any questions you have with your health care provider. Document Released: 09/16/2001 Document Revised: 02/28/2016 Document Reviewed: 11/23/2012 Elsevier Interactive Patient Education  2017 [employer].

## 2017-07-13 NOTE — Progress Notes (Signed)
[place] 13 wks,measurements c/w dates,normal ovaries bilat,fhr 152 bpm,crl 70.46 mm,posterior pl gr 0,NT 1.3 mm,NB present

## 2017-07-13 NOTE — Progress Notes (Signed)
   [facility] Low-Risk Pregnancy Visit  Patient name: Monica Cox MRN [id number]  Date of birth: 10-05-1991 CC & HPI:  Monica Cox is a 26 y.o. [id number] female at [redacted]w[redacted]d with an Estimated Date of Delivery: 01/18/18 being seen today for ongoing management of a low-risk pregnancy.  Today she reports some dizzy spells. No THC since 1st visit. States she wants to get back on Lexapro for depression, had discussed at last visit, but wasn't sent in. Denies SI/HI.   Review of Systems:   Reports no fm.   Denies regular contractions, leakage of fluid, vaginal bleeding, abnormal vaginal discharge w/ itching/odor/irritation, headaches, visual changes, shortness of breath, chest pain, abdominal pain, severe nausea/vomiting, or problems with urination or bowel movements unless otherwise stated above. Pertinent History Reviewed:  Reviewed past medical,surgical, social and family history.  Reviewed problem list, medications and allergies. Objective Findings:   Vitals:   07/13/17 1348  BP: 100/60  Pulse: 80  Weight: 184 lb (83.5 kg)    Body mass index is 32.08 kg/m.  Fundal height: 13wks Fetal heart rate: 152 u/s SVE: n/a Today's NT u/s: [place] 13 wks,measurements c/w dates,normal ovaries bilat,fhr 152 bpm,crl 70.46 mm,posterior pl gr 0,NT 1.3 mm,NB present   Results for orders placed or performed in visit on 07/13/17 (from the past 24 hour(s))  POCT urinalysis dipstick   Collection Time: 07/13/17  1:55 PM  Result Value Ref Range   Color, UA     Clarity, UA     Glucose, UA neg    Bilirubin, UA     Ketones, UA neg    Spec Grav, UA  1.010 - 1.025   Blood, UA neg    pH, UA  5.0 - 8.0   Protein, UA neg    Urobilinogen, UA  0.2 or 1.0 E.U./dL   Nitrite, UA neg    Leukocytes, UA Negative Negative    Assessment & Plan:  1) Low-risk pregnancy [id number] at [redacted]w[redacted]d with an Estimated Date of Delivery: 01/18/18  2) Depression> restart Lexapro  daily, understands can take a few weeks to  notice improvement, rx sent today 3) Dizziness> discussed and gave printed prevention/relief measures 4) THC use earlier pregnancy> advised continued cessation, will repeat uds in few visits  1st IT/NT today  Reviewed: warning s/s to report. All questions were answered Plan:  Continue routine obstetrical care   Return in about 3 weeks (around 08/03/2017) for LROB, 2nd IT.  Orders Placed This Encounter  Procedures  . Integrated 1  . POCT urinalysis dipstick   Marge Duncans CNM, [facility] 07/13/2017 2:30 PM

## 2017-07-15 LAB — INTEGRATED 1
CROWN RUMP LENGTH MAT SCREEN: 70.5 mm
GEST. AGE ON COLLECTION DATE: 13 wk
MATERNAL AGE AT EDD: 28.3 a
NUMBER OF FETUSES: 1
Nuchal Translucency (NT): 1.3 mm
PAPP-A Value: 2524.3 ng/mL
WEIGHT: 184 [lb_av]

## 2017-07-15 LAB — URINE CULTURE

## 2017-07-15 LAB — GC/CHLAMYDIA PROBE AMP
Chlamydia trachomatis, NAA: NEGATIVE
NEISSERIA GONORRHOEAE BY PCR: NEGATIVE

## 2017-07-21 ENCOUNTER — Other Ambulatory Visit: Payer: Self-pay | Admitting: Obstetrics and Gynecology

## 2017-07-22 ENCOUNTER — Telehealth: Payer: Self-pay | Admitting: Women's Health

## 2017-07-22 MED ORDER — PROMETHAZINE HCL 12.5 MG PO TABS: 12.5000 mg | ORAL_TABLET | Freq: Four times a day (QID) | ORAL | 3 refills | Status: DC | PRN

## 2017-07-22 NOTE — Telephone Encounter (Signed)
Patient called stating she needs refills on her phenergan. Please advise.

## 2017-07-22 NOTE — Telephone Encounter (Signed)
Patient called stating that she called her pharmacy to ask for a refill but pt wanted to give us a call for us to send the refill back to pharmacy.

## 2017-08-03 ENCOUNTER — Encounter: Payer: Medicaid Other | Admitting: Women's Health

## 2017-08-17 ENCOUNTER — Encounter: Payer: Medicaid Other | Admitting: Women's Health

## 2017-08-19 ENCOUNTER — Encounter: Payer: Medicaid Other | Admitting: Advanced Practice Midwife

## 2017-08-24 ENCOUNTER — Other Ambulatory Visit: Payer: Self-pay | Admitting: Women's Health

## 2017-08-24 DIAGNOSIS — Z363 Encounter for antenatal screening for malformations: Secondary | ICD-10-CM

## 2017-08-25 ENCOUNTER — Ambulatory Visit (INDEPENDENT_AMBULATORY_CARE_PROVIDER_SITE_OTHER): Payer: Medicaid Other

## 2017-08-25 ENCOUNTER — Encounter: Payer: Self-pay | Admitting: Advanced Practice Midwife

## 2017-08-25 ENCOUNTER — Ambulatory Visit (INDEPENDENT_AMBULATORY_CARE_PROVIDER_SITE_OTHER): Payer: Medicaid Other | Admitting: Advanced Practice Midwife

## 2017-08-25 VITALS — BP 118/62 | HR 82 | Wt 195.0 lb

## 2017-08-25 DIAGNOSIS — O98812 Other maternal infectious and parasitic diseases complicating pregnancy, second trimester: Secondary | ICD-10-CM

## 2017-08-25 DIAGNOSIS — Z3402 Encounter for supervision of normal first pregnancy, second trimester: Secondary | ICD-10-CM

## 2017-08-25 DIAGNOSIS — Z331 Pregnant state, incidental: Secondary | ICD-10-CM

## 2017-08-25 DIAGNOSIS — Z1389 Encounter for screening for other disorder: Secondary | ICD-10-CM

## 2017-08-25 DIAGNOSIS — Z363 Encounter for antenatal screening for malformations: Secondary | ICD-10-CM

## 2017-08-25 DIAGNOSIS — Z3482 Encounter for supervision of other normal pregnancy, second trimester: Secondary | ICD-10-CM

## 2017-08-25 DIAGNOSIS — Z3A19 19 weeks gestation of pregnancy: Secondary | ICD-10-CM

## 2017-08-25 DIAGNOSIS — Z1379 Encounter for other screening for genetic and chromosomal anomalies: Secondary | ICD-10-CM

## 2017-08-25 DIAGNOSIS — B379 Candidiasis, unspecified: Secondary | ICD-10-CM

## 2017-08-25 LAB — POCT URINALYSIS DIPSTICK
Glucose, UA: NEGATIVE
Ketones, UA: NEGATIVE
NITRITE UA: NEGATIVE
Protein, UA: NEGATIVE
RBC UA: NEGATIVE

## 2017-08-25 NOTE — Progress Notes (Signed)
US 19+1 wks,cephalic,post pl gr 0,normal ovaries bilat,cx 4 cm,svp of fluid 5.8 cm,fhr 150 bpm,efw 283 g,anatomy complete,no obvious abnormalities

## 2017-08-25 NOTE — Patient Instructions (Signed)
Monica PeckBriana N Cox, I greatly value your feedback.  If you receive a survey following your visit with us today, we appreciate you taking the time to fill it out.  Thanks, Cathie BeamsFran Cresenzo-Dishmon, CNM     Second Trimester of Pregnancy The second trimester is from week 14 through week 27 (months 4 through 6). The second trimester is often a time when you feel your best. Your body has adjusted to being pregnant, and you begin to feel better physically. Usually, morning sickness has lessened or quit completely, you may have more energy, and you may have an increase in appetite. The second trimester is also a time when the fetus is growing rapidly. At the end of the sixth month, the fetus is about 9 inches long and weighs about 1 pounds. You will likely begin to feel the baby move (quickening) between 16 and 20 weeks of pregnancy. Body changes during your second trimester Your body continues to go through many changes during your second trimester. The changes vary from woman to woman.  Your weight will continue to increase. You will notice your lower abdomen bulging out.  You may begin to get stretch marks on your hips, abdomen, and breasts.  You may develop headaches that can be relieved by medicines. The medicines should be approved by your health care provider.  You may urinate more often because the fetus is pressing on your bladder.  You may develop or continue to have heartburn as a result of your pregnancy.  You may develop constipation because certain hormones are causing the muscles that push waste through your intestines to slow down.  You may develop hemorrhoids or swollen, bulging veins (varicose veins).  You may have back pain. This is caused by: ? Weight gain. ? Pregnancy hormones that are relaxing the joints in your pelvis. ? A shift in weight and the muscles that support your balance.  Your breasts will continue to grow and they will continue to become tender.  Your gums may  bleed and may be sensitive to brushing and flossing.  Dark spots or blotches (chloasma, mask of pregnancy) may develop on your face. This will likely fade after the baby is born.  A dark line from your belly button to the pubic area (linea nigra) may appear. This will likely fade after the baby is born.  You may have changes in your hair. These can include thickening of your hair, rapid growth, and changes in texture. Some women also have hair loss during or after pregnancy, or hair that feels dry or thin. Your hair will most likely return to normal after your baby is born.  What to expect at prenatal visits During a routine prenatal visit:  You will be weighed to make sure you and the fetus are growing normally.  Your blood pressure will be taken.  Your abdomen will be measured to track your baby's growth.  The fetal heartbeat will be listened to.  Any test results from the previous visit will be discussed.  Your health care provider may ask you:  How you are feeling.  If you are feeling the baby move.  If you have had any abnormal symptoms, such as leaking fluid, bleeding, severe headaches, or abdominal cramping.  If you are using any tobacco products, including cigarettes, chewing tobacco, and electronic cigarettes.  If you have any questions.  Other tests that may be performed during your second trimester include:  Blood tests that check for: ? Low iron levels (anemia). ?  High blood sugar that affects pregnant women (gestational diabetes) between 20 and 28 weeks. ? Rh antibodies. This is to check for a protein on red blood cells (Rh factor).  Urine tests to check for infections, diabetes, or protein in the urine.  An ultrasound to confirm the proper growth and development of the baby.  An amniocentesis to check for possible genetic problems.  Fetal screens for spina bifida and Down syndrome.  HIV (human immunodeficiency virus) testing. Routine prenatal testing  includes screening for HIV, unless you choose not to have this test.  Follow these instructions at home: Medicines  Follow your health care provider's instructions regarding medicine use. Specific medicines may be either safe or unsafe to take during pregnancy.  Take a prenatal vitamin that contains at least 600 micrograms (mcg) of folic acid.  If you develop constipation, try taking a stool softener if your health care provider approves. Eating and drinking  Eat a balanced diet that includes fresh fruits and vegetables, whole grains, good sources of protein such as meat, eggs, or tofu, and low-fat dairy. Your health care provider will help you determine the amount of weight gain that is right for you.  Avoid raw meat and uncooked cheese. These carry germs that can cause birth defects in the baby.  If you have low calcium intake from food, talk to your health care provider about whether you should take a daily calcium supplement.  Limit foods that are high in fat and processed sugars, such as fried and sweet foods.  To prevent constipation: ? Drink enough fluid to keep your urine clear or pale yellow. ? Eat foods that are high in fiber, such as fresh fruits and vegetables, whole grains, and beans. Activity  Exercise only as directed by your health care provider. Most women can continue their usual exercise routine during pregnancy. Try to exercise for 30 minutes at least 5 days a week. Stop exercising if you experience uterine contractions.  Avoid heavy lifting, wear low heel shoes, and practice good posture.  A sexual relationship may be continued unless your health care provider directs you otherwise. Relieving pain and discomfort  Wear a good support bra to prevent discomfort from breast tenderness.  Take warm sitz baths to soothe any pain or discomfort caused by hemorrhoids. Use hemorrhoid cream if your health care provider approves.  Rest with your legs elevated if you have  leg cramps or low back pain.  If you develop varicose veins, wear support hose. Elevate your feet for 15 minutes, 3-4 times a day. Limit salt in your diet. Prenatal Care  Write down your questions. Take them to your prenatal visits.  Keep all your prenatal visits as told by your health care provider. This is important. Safety  Wear your seat belt at all times when driving.  Make a list of emergency phone numbers, including numbers for family, friends, the hospital, and police and fire departments. General instructions  Ask your health care provider for a referral to a local prenatal education class. Begin classes no later than the beginning of month 6 of your pregnancy.  Ask for help if you have counseling or nutritional needs during pregnancy. Your health care provider can offer advice or refer you to specialists for help with various needs.  Do not use hot tubs, steam rooms, or saunas.  Do not douche or use tampons or scented sanitary pads.  Do not cross your legs for long periods of time.  Avoid cat litter boxes and  soil used by cats. These carry germs that can cause birth defects in the baby and possibly loss of the fetus by miscarriage or stillbirth.  Avoid all smoking, herbs, alcohol, and unprescribed drugs. Chemicals in these products can affect the formation and growth of the baby.  Do not use any products that contain nicotine or tobacco, such as cigarettes and e-cigarettes. If you need help quitting, ask your health care provider.  Visit your dentist if you have not gone yet during your pregnancy. Use a soft toothbrush to brush your teeth and be gentle when you floss. Contact a health care provider if:  You have dizziness.  You have mild pelvic cramps, pelvic pressure, or nagging pain in the abdominal area.  You have persistent nausea, vomiting, or diarrhea.  You have a bad smelling vaginal discharge.  You have pain when you urinate. Get help right away if:  You  have a fever.  You are leaking fluid from your vagina.  You have spotting or bleeding from your vagina.  You have severe abdominal cramping or pain.  You have rapid weight gain or weight loss.  You have shortness of breath with chest pain.  You notice sudden or extreme swelling of your face, hands, ankles, feet, or legs.  You have not felt your baby move in over an hour.  You have severe headaches that do not go away when you take medicine.  You have vision changes. Summary  The second trimester is from week 14 through week 27 (months 4 through 6). It is also a time when the fetus is growing rapidly.  Your body goes through many changes during pregnancy. The changes vary from woman to woman.  Avoid all smoking, herbs, alcohol, and unprescribed drugs. These chemicals affect the formation and growth your baby.  Do not use any tobacco products, such as cigarettes, chewing tobacco, and e-cigarettes. If you need help quitting, ask your health care provider.  Contact your health care provider if you have any questions. Keep all prenatal visits as told by your health care provider. This is important. This information is not intended to replace advice given to you by your health care provider. Make sure you discuss any questions you have with your health care provider.      CHILDBIRTH CLASSES [phone] is the phone number for Pregnancy Classes or hospital tours at [place] will be referred to  [email or web address] for more information on childbirth classes  At this site you may register for classes. You may sign up for a waiting list if classes are full. Please SIGN UP FOR THIS!.   When the waiting list becomes long, sometimes new classes can be added.

## 2017-08-25 NOTE — Addendum Note (Signed)
Addended by: Colen DarlingYOUNG, Jesseka Drinkard S on: 08/25/2017 04:13 PM   Modules accepted: Orders

## 2017-08-25 NOTE — Progress Notes (Signed)
LOW-RISK PREGNANCY VISIT Patient name: Monica Cox MRN [id number]  Date of birth: 1991-06-21 Chief Complaint:   Routine Prenatal Visit (US and 2nd IT today; ? yeast infection)  History of Present Illness:   Monica Cox is a 26 y.o. [id number] female at 5827w1d with an Estimated Date of Delivery: 01/18/18 being seen today for ongoing management of a low-risk pregnancy.  Today she reports thick Dishcarge.  Also had some ideas about kiling herself last night. .They lasted for a few hours, she cried and felt cleansed  Has been out of lexapro for 2 days (gets paid tomorrow and plans to pick up rx). Denies plan for suicide.Feels fine today Contractions: Not present. Vag. Bleeding: None.  Movement: Present. denies leaking of fluid. Review of Systems:   Pertinent items are noted in HPI Denies abnormal vaginal discharge w/ itching/odor/irritation, headaches, visual changes, shortness of breath, chest pain, abdominal pain, severe nausea/vomiting, or problems with urination or bowel movements unless otherwise stated above.  Pertinent History Reviewed:  Medical & Surgical Hx:   Past Medical History:  Diagnosis Date  . Anemia   . Asthma   . H/O trichomonas 08/08/2014   2015 pregnancy   . History of chlamydia 03/19/2016   2015 pregnancy   . Hypertension    pp htn  . Nausea 02/06/2016  . Reflux   . Smoker 02/06/2016  . Trichomonas infection 08-08-2014  . Vaginal Pap smear, abnormal   . Vertigo 09/21/2014   antivert rx 09/21/2014     Past Surgical History:  Procedure Laterality Date  . DILATION AND CURETTAGE OF UTERUS     MAB Dr. Despina HiddenEure performed  . ESOPHAGOGASTRODUODENOSCOPY ENDOSCOPY     Family History  Problem Relation Age of Onset  . Ulcers Father   . Hypertension Father   . Hypertension Mother   . Ulcers Paternal Grandmother   . Diabetes Paternal Grandmother   . Ulcers Paternal Aunt   . Liver disease Neg Hx   . Colon cancer Neg Hx     Current Outpatient Medications:  .   acetaminophen (TYLENOL) 500 MG tablet, Take 1,000 mg by mouth as needed., Disp: , Rfl:  .  albuterol (PROVENTIL HFA;VENTOLIN HFA) 108 (90 Base) MCG/ACT inhaler, Inhale 1-2 puffs into the lungs every 4 (four) hours as needed for wheezing or shortness of breath., Disp: 1 Inhaler, Rfl: 0 .  BABY ASPIRIN PO, Take by mouth daily., Disp: , Rfl:  .  escitalopram (LEXAPRO) 10 MG tablet, Take 1 tablet (10 mg total) by mouth daily., Disp: 30 tablet, Rfl: 6 .  Prenat-FeCbn-FeAspGl-FA-Omega (OB COMPLETE PETITE) 35-5-1-200 MG CAPS, TAKE 1 CAPSULE EVERY DAY, Disp: 30 capsule, Rfl: 12 .  promethazine (PHENERGAN) 12.5 MG tablet, Take 1 tablet (12.5 mg total) by mouth every 6 (six) hours as needed for nausea or vomiting., Disp: 30 tablet, Rfl: 3 .  promethazine (PHENERGAN) 25 MG suppository, Place 1 suppository (25 mg total) rectally every 6 (six) hours as needed for nausea or vomiting., Disp: 12 each, Rfl: 0 Social History: Reviewed -  reports that she has quit smoking. Her smoking use included cigars. She quit after 3.00 years of use. she has never used smokeless tobacco.  Physical Assessment:   Vitals:   08/25/17 1442  BP: 118/62  Pulse: 82  Weight: 195 lb (88.5 kg)  Body mass index is 34 kg/m.        Physical Examination:   General appearance: Well appearing, and in no distress  Mental status: Alert, oriented  to person, place, and time  Skin: Warm & dry  Cardiovascular: Normal heart rate noted  Respiratory: Normal respiratory effort, no distress  Abdomen: Soft, gravid, nontender  Pelvic: Cervical exam deferred thick white dc c/w yeast        Extremities: Edema: None  Fetal Status: Fetal Heart Rate (bpm): 150   Movement: Present    Results for orders placed or performed in visit on 06/25/17 (from the past 24 hour(s))  POCT urinalysis dipstick   Collection Time: 08/25/17  2:43 PM  Result Value Ref Range   Color, UA     Clarity, UA     Glucose, UA neg    Bilirubin, UA     Ketones, UA neg     Spec Grav, UA  1.010 - 1.025   Blood, UA neg    pH, UA  5.0 - 8.0   Protein, UA neg    Urobilinogen, UA  0.2 or 1.0 E.U./dL   Nitrite, UA neg    Leukocytes, UA Moderate (2+) (A) Negative    Assessment & Plan:  1) Low-risk pregnancy [id number] at 5745w1d with an Estimated Date of Delivery: 01/18/18   2)  yeast infection   Labs/procedures/US today: US 19+1 wks,cephalic,post pl gr 0,normal ovaries bilat,cx 4 cm,svp of fluid 5.8 cm,fhr 150 bpm,efw 283 g,anatomy complete,no obvious abnormalities  Plan:  Continue routine obstetrical care   Pick up lexapro and an OTC 7 day yeast treatment  Follow-up: Return in about 4 weeks (around 09/22/2017) for LROB.  Orders Placed This Encounter  Procedures  . INTEGRATED 2   CRESENZO-DISHMAN,Shaden Higley CNM 08/25/2017 3:17 PM

## 2017-08-30 LAB — INTEGRATED 2
ADSF: 1.15
AFP MOM: 2
Alpha-Fetoprotein: 85.2 ng/mL
Crown Rump Length: 70.5 mm
DIA MoM: 3.26
DIA VALUE: 501 pg/mL
ESTRIOL UNCONJUGATED: 1.62 ng/mL
GEST. AGE ON COLLECTION DATE: 13 wk
Gestational Age: 19.1 weeks
HCG MOM: 0.61
HCG VALUE: 11.3 [IU]/mL
MATERNAL AGE AT EDD: 28.3 a
NUCHAL TRANSLUCENCY (NT): 1.3 mm
NUMBER OF FETUSES: 1
Nuchal Translucency MoM: 0.75
PAPP-A MoM: 2.87
PAPP-A Value: 2524.3 ng/mL
Test Results:: NEGATIVE
WEIGHT: 184 [lb_av]
Weight: 195 [lb_av]

## 2017-09-01 NOTE — Telephone Encounter (Signed)
Request delayed in reaching inbasket. Taken care of thru other contacts with pt. 

## 2017-09-07 ENCOUNTER — Telehealth: Payer: Self-pay | Admitting: Women's Health

## 2017-09-07 ENCOUNTER — Telehealth: Payer: Self-pay | Admitting: *Deleted

## 2017-09-07 ENCOUNTER — Ambulatory Visit (INDEPENDENT_AMBULATORY_CARE_PROVIDER_SITE_OTHER): Payer: Medicaid Other | Admitting: Women's Health

## 2017-09-07 ENCOUNTER — Other Ambulatory Visit: Payer: Self-pay

## 2017-09-07 ENCOUNTER — Encounter: Payer: Self-pay | Admitting: Women's Health

## 2017-09-07 VITALS — BP 124/84 | HR 102 | Temp 98.5°F | Wt 196.0 lb

## 2017-09-07 DIAGNOSIS — R82998 Other abnormal findings in urine: Secondary | ICD-10-CM

## 2017-09-07 DIAGNOSIS — Z331 Pregnant state, incidental: Secondary | ICD-10-CM

## 2017-09-07 DIAGNOSIS — R6889 Other general symptoms and signs: Secondary | ICD-10-CM | POA: Insufficient documentation

## 2017-09-07 DIAGNOSIS — Z1389 Encounter for screening for other disorder: Secondary | ICD-10-CM

## 2017-09-07 DIAGNOSIS — H6691 Otitis media, unspecified, right ear: Secondary | ICD-10-CM

## 2017-09-07 LAB — POCT URINALYSIS DIPSTICK
GLUCOSE UA: NEGATIVE
Nitrite, UA: NEGATIVE
RBC UA: NEGATIVE

## 2017-09-07 MED ORDER — OSELTAMIVIR PHOSPHATE 75 MG PO CAPS: 75.0000 mg | ORAL_CAPSULE | Freq: Two times a day (BID) | ORAL | 0 refills | Status: DC

## 2017-09-07 MED ORDER — AMOXICILLIN 500 MG PO CAPS
500.0000 mg | ORAL_CAPSULE | Freq: Two times a day (BID) | ORAL | 0 refills | Status: DC
Start: 2017-09-07 — End: 2017-10-28

## 2017-09-07 NOTE — Telephone Encounter (Signed)
Patient notified that [facility] was the only pharmacy in town that had brand. Verbal order called in and patient to pick up later today.

## 2017-09-07 NOTE — Telephone Encounter (Signed)
Patient called with complaints of vomiting, cold chills and fever 100.5. She has also had sinus congestion and yellow/green mucous from her nose and has had some blood as well. Advised patient we needed to see her and to request mask when she come to the office. Verbalized understanding.

## 2017-09-07 NOTE — Patient Instructions (Signed)
   Humidifier and saline nasal spray for nasal congestion  Regular robitussin, cough drops for cough  Warm salt water gargles for sore throat  Mucinex with lots of water to help you cough up the mucous in your chest if needed  Drink plenty of fluids and stay hydrated!  Wash your hands frequently.  Call us if you are not improving or are worsening

## 2017-09-07 NOTE — Progress Notes (Addendum)
Work-in GYN visit/LOW-RISK PREGNANCY  Patient name: Monica Cox MRN [id number]  Date of birth: 04-09-1991 Chief Complaint:   flu like symptoms (c/o chills; temp 100.5 last night; stuffy; green & bloody mucous; vomiting; sore throat)  History of Present Illness:   Monica Cox is a 26 y.o. [id number] female at 3427w0d with an Estimated Date of Delivery: 01/18/18 being seen today as work-in for flu-like illness; ongoing management of a low-risk pregnancy.  Today she reports Sat night began w/ nasal congestion and green/blood tinged drainage, vomiting, sore throat, body aches, Rt ear pain; Tmax 101 yesterday. Took TheraFlu last night. Also some coughin, nonproductive. Did have flu shot 06/25/17. No sick contacts she is aware of.  Contractions: Not present. Vag. Bleeding: None.  Movement: Present. denies leaking of fluid. Review of Systems:   Pertinent items are noted in HPI Denies abnormal vaginal discharge w/ itching/odor/irritation, headaches, visual changes, shortness of breath, chest pain, abdominal pain, severe nausea/vomiting, or problems with urination or bowel movements unless otherwise stated above. Pertinent History Reviewed:  Reviewed past medical,surgical, social, obstetrical and family history.  Reviewed problem list, medications and allergies. Physical Assessment:   Vitals:   09/07/17 1113  BP: 124/84  Pulse: (!) 102  Temp: 98.5 F (36.9 C)  Weight: 196 lb (88.9 kg)  Body mass index is 34.18 kg/m.        Physical Examination:   General appearance: Appears like she doesn't feel well, has mask on, vomited x 1 in my presence  Mental status: Alert, oriented to person, place, and time  Skin: Warm & dry  Cardiovascular: Normal heart rate noted, HRRR  Respiratory: Normal respiratory effort, no distress, LCTAB  Ears: Lt small amt fluid, Rt erythematous w/ small amt cloudy fluid  Throat: slightly erythematous, no exudate, + Rt lymphadenopathy and tenderness  Abdomen: Soft,  gravid, nontender  Pelvic: Cervical exam deferred         Extremities: Edema: None  Fetal Status: Fetal Heart Rate (bpm): 154   Movement: Present    Results for orders placed or performed in visit on 09/07/17 (from the past 24 hour(s))  POCT urinalysis dipstick   Collection Time: 09/07/17 11:14 AM  Result Value Ref Range   Color, UA     Clarity, UA     Glucose, UA neg    Bilirubin, UA     Ketones, UA large    Spec Grav, UA  1.010 - 1.025   Blood, UA neg    pH, UA  5.0 - 8.0   Protein, UA trace    Urobilinogen, UA  0.2 or 1.0 E.U./dL   Nitrite, UA neg    Leukocytes, UA Moderate (2+) (A) Negative    Assessment & Plan:  1) Flu-like illness> Rx tamiflu 75mg  BID x 5d, let us know if worsening/not improving. Gave printed list of symptom relief measures  2) Rt otitis media> Rx amoxicillin 500mg  BID x 7d  3) Leuks urine> asymptomatic for uti s/s, will send urine cx  4) Low-risk pregnancy [id number] at 2627w0d with an Estimated Date of Delivery: 01/18/18    Labs/procedures today: none  Plan:  Continue routine obstetrical care   Reviewed: Preterm labor symptoms and general obstetric precautions including but not limited to vaginal bleeding, contractions, leaking of fluid and fetal movement were reviewed in detail with the patient.  All questions were answered  Follow-up: Return for As scheduled 12/18.  Orders Placed This Encounter  Procedures  . POCT urinalysis dipstick   Grace BushyBooker,  Cheron EveryKimberly Randall CNM, [facility] 09/07/2017 11:37 AM

## 2017-09-07 NOTE — Addendum Note (Signed)
Addended by: Shawna ClampBOOKER, Pennelope Basque R on: 09/07/2017 11:44 AM   Modules accepted: Orders

## 2017-09-08 DIAGNOSIS — Z029 Encounter for administrative examinations, unspecified: Secondary | ICD-10-CM

## 2017-09-09 LAB — URINE CULTURE: Organism ID, Bacteria: NO GROWTH

## 2017-09-22 ENCOUNTER — Encounter: Payer: Self-pay | Admitting: Advanced Practice Midwife

## 2017-09-22 ENCOUNTER — Ambulatory Visit (INDEPENDENT_AMBULATORY_CARE_PROVIDER_SITE_OTHER): Payer: Medicaid Other | Admitting: Advanced Practice Midwife

## 2017-09-22 VITALS — BP 120/80 | HR 98 | Wt 196.6 lb

## 2017-09-22 DIAGNOSIS — Z3482 Encounter for supervision of other normal pregnancy, second trimester: Secondary | ICD-10-CM

## 2017-09-22 DIAGNOSIS — Z331 Pregnant state, incidental: Secondary | ICD-10-CM

## 2017-09-22 DIAGNOSIS — Z3A23 23 weeks gestation of pregnancy: Secondary | ICD-10-CM

## 2017-09-22 DIAGNOSIS — Z1389 Encounter for screening for other disorder: Secondary | ICD-10-CM

## 2017-09-22 LAB — POCT URINALYSIS DIPSTICK
Glucose, UA: NEGATIVE
Leukocytes, UA: NEGATIVE
NITRITE UA: NEGATIVE
RBC UA: NEGATIVE

## 2017-09-22 NOTE — Progress Notes (Signed)
[id number] 5566w1d Estimated Date of Delivery: 01/18/18  Last menstrual period 04/13/2017, not currently breastfeeding.   Worried that husband is cheating (he has a hx, people at work )  Wants to make sure yeast is gone. SSE:  Normal dc, vagina looks fine.   BP weight and urine results all reviewed and noted.  Please refer to the obstetrical flow sheet for the fundal height and fetal heart rate documentation:  Patient reports good fetal movement, denies any bleeding and no rupture of membranes symptoms or regular contractions. Patient is without complaints. All questions were answered.  No orders of the defined types were placed in this encounter.   Plan:  Continued routine obstetrical care,   Return in about 4 weeks (around 10/20/2017) for PN2/LROB.

## 2017-09-22 NOTE — Patient Instructions (Addendum)
1. Before your test, do not eat or drink anything for 8-10 hours prior to your  appointment (a small amount of water is allowed and you may take any medicines you normally take). Be sure to drink lots of water the day before. 2. When you arrive, your blood will be drawn for a 'fasting' blood sugar level.  Then you will be given a sweetened carbonated beverage to drink. You should  complete drinking this beverage within five minutes. After finishing the  beverage, you will have your blood drawn exactly 1 and 2 hours later. Having  your blood drawn on time is an important part of this test. A total of three blood  samples will be done. 3. The test takes approximately 2  hours. During the test, do not have anything to  eat or drink. Do not smoke, chew gum (not even sugarless gum) or use breath mints.  4. During the test you should remain close by and seated as much as possible and  avoid walking around. You may want to bring a book or something else to  occupy your time.  5. After your test, you may eat and drink as normal. You may want to bring a snack  to eat after the test is finished. Your provider will advise you as to the results of  this test and any follow-up if necessary  If your sugar test is positive for gestational diabetes, you will be given an phone call and further instructions discussed. If you wish to know all of your test results before your next appointment, feel free to call the office, or look up your test results on Mychart.  (The range that the lab uses for normal values of the sugar test are not necessarily the range that is used for pregnant women; if your results are within the normal range, they are definitely normal.  However, if a value is deemed "high" by the lab, it may not be too high for a pregnant woman.  We will need to discuss the results if your value(s) fall in the "high" category).    Kinesiology taping for pregnancy:  Youtube has good vidoes of "how tos" for  lower back, pelvic, hip pain; swelling of feet, etc    Tdap Vaccine  It is recommended that you get the Tdap vaccine during the third trimester of EACH pregnancy to help protect your baby from getting pertussis (whooping cough)  27-36 weeks is the BEST time to do this so that you can pass the protection on to your baby. During pregnancy is better than after pregnancy, but if you are unable to get it during pregnancy it will be offered at the hospital.  You can get this vaccine at the health department or your family doctor, as well as some pharmacies.  Everyone who will be around your baby should also be up-to-date on their vaccines. Adults (who are not pregnant) only need 1 dose of Tdap during adulthood.

## 2017-10-06 NOTE — L&D Delivery Note (Signed)
Delivery Note At 0507 a viable female infant was delivered precipitously by RN in bed via SVD, presentation: OA. APGAR: 8, 9; weight pending.   Placenta status: spontaneously delivered, intact via Tomasa BlaseSchultz. Cord: 3 vessel. Cord ph n/a. Complications none.  Anesthesia: local, epidural Lacerations: 1st degree perineal-not hemostatic after pressure Suture used for repair: 3-0 Vicryl rapide Est. Blood Loss (mL): 50  Mom to postpartum.  Baby to Couplet care / Skin to Skin.   Donette LarryBhambri, Ottilia Pippenger, CNM 01/11/2018 5:35 AM

## 2017-10-19 ENCOUNTER — Encounter: Payer: Medicaid Other | Admitting: Women's Health

## 2017-10-19 ENCOUNTER — Other Ambulatory Visit: Payer: Medicaid Other

## 2017-10-28 ENCOUNTER — Encounter: Payer: Self-pay | Admitting: Women's Health

## 2017-10-28 ENCOUNTER — Ambulatory Visit (INDEPENDENT_AMBULATORY_CARE_PROVIDER_SITE_OTHER): Payer: Medicaid Other | Admitting: Women's Health

## 2017-10-28 ENCOUNTER — Other Ambulatory Visit: Payer: Medicaid Other

## 2017-10-28 ENCOUNTER — Other Ambulatory Visit: Payer: Self-pay

## 2017-10-28 VITALS — BP 106/60 | HR 96 | Wt 199.0 lb

## 2017-10-28 DIAGNOSIS — Z331 Pregnant state, incidental: Secondary | ICD-10-CM

## 2017-10-28 DIAGNOSIS — Z3A28 28 weeks gestation of pregnancy: Secondary | ICD-10-CM

## 2017-10-28 DIAGNOSIS — Z3483 Encounter for supervision of other normal pregnancy, third trimester: Secondary | ICD-10-CM

## 2017-10-28 DIAGNOSIS — Z1389 Encounter for screening for other disorder: Secondary | ICD-10-CM

## 2017-10-28 LAB — POCT URINALYSIS DIPSTICK
Glucose, UA: NEGATIVE
Ketones, UA: NEGATIVE
NITRITE UA: NEGATIVE
RBC UA: NEGATIVE

## 2017-10-28 NOTE — Patient Instructions (Signed)
Monica Cox, I greatly value your feedback.  If you receive a survey following your visit with us today, we appreciate you taking the time to fill it out.  Thanks, Kim Rozelia Catapano, CNM, WHNP-BC   Call the office ([phone]) or go to [facility] if:  You begin to have strong, frequent contractions  Your water breaks.  Sometimes it is a big gush of fluid, sometimes it is just a trickle that keeps getting your panties wet or running down your legs  You have vaginal bleeding.  It is normal to have a small amount of spotting if your cervix was checked.   You don't feel your baby moving like normal.  If you don't, get you something to eat and drink and lay down and focus on feeling your baby move.  You should feel at least 10 movements in 2 hours.  If you don't, you should call the office or go to [facility].    Tdap Vaccine  It is recommended that you get the Tdap vaccine during the third trimester of EACH pregnancy to help protect your baby from getting pertussis (whooping cough)  27-36 weeks is the BEST time to do this so that you can pass the protection on to your baby. During pregnancy is better than after pregnancy, but if you are unable to get it during pregnancy it will be offered at the hospital.   You can get this vaccine at the health department or your family doctor  Everyone who will be around your baby should also be up-to-date on their vaccines. Adults (who are not pregnant) only need 1 dose of Tdap during adulthood.   Third Trimester of Pregnancy The third trimester is from week 29 through week 42, months 7 through 9. The third trimester is a time when the fetus is growing rapidly. At the end of the ninth month, the fetus is about 20 inches in length and weighs 6-10 pounds.  BODY CHANGES Your body goes through many changes during pregnancy. The changes vary from woman to woman.   Your weight will continue to increase. You can expect to gain 25-35 pounds (11-16 kg) by  the end of the pregnancy.  You may begin to get stretch marks on your hips, abdomen, and breasts.  You may urinate more often because the fetus is moving lower into your pelvis and pressing on your bladder.  You may develop or continue to have heartburn as a result of your pregnancy.  You may develop constipation because certain hormones are causing the muscles that push waste through your intestines to slow down.  You may develop hemorrhoids or swollen, bulging veins (varicose veins).  You may have pelvic pain because of the weight gain and pregnancy hormones relaxing your joints between the bones in your pelvis. Backaches may result from overexertion of the muscles supporting your posture.  You may have changes in your hair. These can include thickening of your hair, rapid growth, and changes in texture. Some women also have hair loss during or after pregnancy, or hair that feels dry or thin. Your hair will most likely return to normal after your baby is born.  Your breasts will continue to grow and be tender. A yellow discharge may leak from your breasts called colostrum.  Your belly button may stick out.  You may feel short of breath because of your expanding uterus.  You may notice the fetus "dropping," or moving lower in your abdomen.  You may have a bloody   mucus discharge. This usually occurs a few days to a week before labor begins.  Your cervix becomes thin and soft (effaced) near your due date. WHAT TO EXPECT AT YOUR PRENATAL EXAMS  You will have prenatal exams every 2 weeks until week 36. Then, you will have weekly prenatal exams. During a routine prenatal visit:  You will be weighed to make sure you and the fetus are growing normally.  Your blood pressure is taken.  Your abdomen will be measured to track your baby's growth.  The fetal heartbeat will be listened to.  Any test results from the previous visit will be discussed.  You may have a cervical check near your  due date to see if you have effaced. At around 36 weeks, your caregiver will check your cervix. At the same time, your caregiver will also perform a test on the secretions of the vaginal tissue. This test is to determine if a type of bacteria, Group B streptococcus, is present. Your caregiver will explain this further. Your caregiver may ask you:  What your birth plan is.  How you are feeling.  If you are feeling the baby move.  If you have had any abnormal symptoms, such as leaking fluid, bleeding, severe headaches, or abdominal cramping.  If you have any questions. Other tests or screenings that may be performed during your third trimester include:  Blood tests that check for low iron levels (anemia).  Fetal testing to check the health, activity level, and growth of the fetus. Testing is done if you have certain medical conditions or if there are problems during the pregnancy. FALSE LABOR You may feel small, irregular contractions that eventually go away. These are called Braxton Hicks contractions, or false labor. Contractions may last for hours, days, or even weeks before true labor sets in. If contractions come at regular intervals, intensify, or become painful, it is best to be seen by your caregiver.  SIGNS OF LABOR   Menstrual-like cramps.  Contractions that are 5 minutes apart or less.  Contractions that start on the top of the uterus and spread down to the lower abdomen and back.  A sense of increased pelvic pressure or back pain.  A watery or bloody mucus discharge that comes from the vagina. If you have any of these signs before the 37th week of pregnancy, call your caregiver right away. You need to go to the hospital to get checked immediately. HOME CARE INSTRUCTIONS   Avoid all smoking, herbs, alcohol, and unprescribed drugs. These chemicals affect the formation and growth of the baby.  Follow your caregiver's instructions regarding medicine use. There are medicines  that are either safe or unsafe to take during pregnancy.  Exercise only as directed by your caregiver. Experiencing uterine cramps is a good sign to stop exercising.  Continue to eat regular, healthy meals.  Wear a good support bra for breast tenderness.  Do not use hot tubs, steam rooms, or saunas.  Wear your seat belt at all times when driving.  Avoid raw meat, uncooked cheese, cat litter boxes, and soil used by cats. These carry germs that can cause birth defects in the baby.  Take your prenatal vitamins.  Try taking a stool softener (if your caregiver approves) if you develop constipation. Eat more high-fiber foods, such as fresh vegetables or fruit and whole grains. Drink plenty of fluids to keep your urine clear or pale yellow.  Take warm sitz baths to soothe any pain or discomfort caused by hemorrhoids.  Use hemorrhoid cream if your caregiver approves.  If you develop varicose veins, wear support hose. Elevate your feet for 15 minutes, 3-4 times a day. Limit salt in your diet.  Avoid heavy lifting, wear low heal shoes, and practice good posture.  Rest a lot with your legs elevated if you have leg cramps or low back pain.  Visit your dentist if you have not gone during your pregnancy. Use a soft toothbrush to brush your teeth and be gentle when you floss.  A sexual relationship may be continued unless your caregiver directs you otherwise.  Do not travel far distances unless it is absolutely necessary and only with the approval of your caregiver.  Take prenatal classes to understand, practice, and ask questions about the labor and delivery.  Make a trial run to the hospital.  Pack your hospital bag.  Prepare the baby's nursery.  Continue to go to all your prenatal visits as directed by your caregiver. SEEK MEDICAL CARE IF:  You are unsure if you are in labor or if your water has broken.  You have dizziness.  You have mild pelvic cramps, pelvic pressure, or nagging  pain in your abdominal area.  You have persistent nausea, vomiting, or diarrhea.  You have a bad smelling vaginal discharge.  You have pain with urination. SEEK IMMEDIATE MEDICAL CARE IF:   You have a fever.  You are leaking fluid from your vagina.  You have spotting or bleeding from your vagina.  You have severe abdominal cramping or pain.  You have rapid weight loss or gain.  You have shortness of breath with chest pain.  You notice sudden or extreme swelling of your face, hands, ankles, feet, or legs.  You have not felt your baby move in over an hour.  You have severe headaches that do not go away with medicine.  You have vision changes. Document Released: 09/16/2001 Document Revised: 09/27/2013 Document Reviewed: 11/23/2012 Exit[employer] Patient Information 2015 [employer]. This information is not intended to replace advice given to you by your health care provider. Make sure you discuss any questions you have with your health care provider.   

## 2017-10-28 NOTE — Progress Notes (Signed)
   LOW-RISK PREGNANCY VISIT Patient name: Monica Cox MRN [id number]  Date of birth: 28-May-1991 Chief Complaint:   Routine Prenatal Visit (PN2 today)  History of Present Illness:   Monica Cox is a 27 y.o. [id number] female at 8223w2d with an Estimated Date of Delivery: 01/18/18 being seen today for ongoing management of a low-risk pregnancy.  Today she reports no complaints. Contractions: Irregular. Vag. Bleeding: None.  Movement: Present. denies leaking of fluid. Review of Systems:   Pertinent items are noted in HPI Denies abnormal vaginal discharge w/ itching/odor/irritation, headaches, visual changes, shortness of breath, chest pain, abdominal pain, severe nausea/vomiting, or problems with urination or bowel movements unless otherwise stated above. Pertinent History Reviewed:  Reviewed past medical,surgical, social, obstetrical and family history.  Reviewed problem list, medications and allergies. Physical Assessment:   Vitals:   10/28/17 0950  BP: 106/60  Pulse: 96  Weight: 199 lb (90.3 kg)  Body mass index is 34.7 kg/m.        Physical Examination:   General appearance: Well appearing, and in no distress  Mental status: Alert, oriented to person, place, and time  Skin: Warm & dry  Cardiovascular: Normal heart rate noted  Respiratory: Normal respiratory effort, no distress  Abdomen: Soft, gravid, nontender  Pelvic: Cervical exam deferred         Extremities: Edema: Trace  Fetal Status: Fetal Heart Rate (bpm): 150 Fundal Height: 28 cm Movement: Present    Results for orders placed or performed in visit on 10/28/17 (from the past 24 hour(s))  POCT urinalysis dipstick   Collection Time: 10/28/17  9:51 AM  Result Value Ref Range   Color, UA     Clarity, UA     Glucose, UA neg    Bilirubin, UA     Ketones, UA neg    Spec Grav, UA  1.010 - 1.025   Blood, UA neg    pH, UA  5.0 - 8.0   Protein, UA trace    Urobilinogen, UA  0.2 or 1.0 E.U./dL   Nitrite, UA neg    Leukocytes, UA Moderate (2+) (A) Negative   Appearance     Odor      Assessment & Plan:  1) Low-risk pregnancy [id number] at 2223w2d with an Estimated Date of Delivery: 01/18/18    Meds: No orders of the defined types were placed in this encounter.  Labs/procedures today: pn2  Plan:  Continue routine obstetrical care   Reviewed: Preterm labor symptoms and general obstetric precautions including but not limited to vaginal bleeding, contractions, leaking of fluid and fetal movement were reviewed in detail with the patient.  Recommended Tdap at HD/PCP per CDC guidelines. All questions were answered  Follow-up: Return in about 4 weeks (around 11/25/2017) for LROB.  Orders Placed This Encounter  Procedures  . POCT urinalysis dipstick   [name], Otho Michalik Randall CNM, [facility] 10/28/2017 10:35 AM

## 2017-10-29 LAB — CBC
HEMATOCRIT: 33.4 % — AB (ref 34.0–46.6)
Hemoglobin: 11.1 g/dL (ref 11.1–15.9)
MCH: 30.5 pg (ref 26.6–33.0)
MCHC: 33.2 g/dL (ref 31.5–35.7)
MCV: 92 fL (ref 79–97)
Platelets: 239 10*3/uL (ref 150–379)
RBC: 3.64 x10E6/uL — ABNORMAL LOW (ref 3.77–5.28)
RDW: 14.2 % (ref 12.3–15.4)
WBC: 7.6 10*3/uL (ref 3.4–10.8)

## 2017-10-29 LAB — GLUCOSE TOLERANCE, 2 HOURS W/ 1HR
GLUCOSE, 2 HOUR: 97 mg/dL (ref 65–152)
Glucose, 1 hour: 103 mg/dL (ref 65–179)
Glucose, Fasting: 69 mg/dL (ref 65–91)

## 2017-10-29 LAB — ANTIBODY SCREEN: Antibody Screen: NEGATIVE

## 2017-10-29 LAB — RPR: RPR Ser Ql: NONREACTIVE

## 2017-10-29 LAB — HIV ANTIBODY (ROUTINE TESTING W REFLEX): HIV Screen 4th Generation wRfx: NONREACTIVE

## 2017-11-04 ENCOUNTER — Other Ambulatory Visit: Payer: Self-pay | Admitting: Women's Health

## 2017-11-05 ENCOUNTER — Telehealth: Payer: Self-pay | Admitting: *Deleted

## 2017-11-05 NOTE — Telephone Encounter (Signed)
LMOVM to call to let us know which medication she is requesting a refill on.

## 2017-11-25 ENCOUNTER — Ambulatory Visit (INDEPENDENT_AMBULATORY_CARE_PROVIDER_SITE_OTHER): Payer: Medicaid Other | Admitting: Women's Health

## 2017-11-25 ENCOUNTER — Encounter: Payer: Self-pay | Admitting: Women's Health

## 2017-11-25 VITALS — BP 110/60 | HR 84 | Wt 203.0 lb

## 2017-11-25 DIAGNOSIS — Z3483 Encounter for supervision of other normal pregnancy, third trimester: Secondary | ICD-10-CM

## 2017-11-25 DIAGNOSIS — Z6791 Unspecified blood type, Rh negative: Secondary | ICD-10-CM

## 2017-11-25 DIAGNOSIS — Z331 Pregnant state, incidental: Secondary | ICD-10-CM

## 2017-11-25 DIAGNOSIS — Z1389 Encounter for screening for other disorder: Secondary | ICD-10-CM

## 2017-11-25 DIAGNOSIS — O26899 Other specified pregnancy related conditions, unspecified trimester: Secondary | ICD-10-CM

## 2017-11-25 DIAGNOSIS — Z3A32 32 weeks gestation of pregnancy: Secondary | ICD-10-CM | POA: Diagnosis not present

## 2017-11-25 DIAGNOSIS — O360130 Maternal care for anti-D [Rh] antibodies, third trimester, not applicable or unspecified: Secondary | ICD-10-CM

## 2017-11-25 LAB — POCT URINALYSIS DIPSTICK
Blood, UA: NEGATIVE
GLUCOSE UA: NEGATIVE
Ketones, UA: NEGATIVE
Nitrite, UA: NEGATIVE

## 2017-11-25 MED ORDER — RHO D IMMUNE GLOBULIN 1500 UNIT/2ML IJ SOSY
300.0000 ug | PREFILLED_SYRINGE | Freq: Once | INTRAMUSCULAR | Status: AC
  Administered 2017-11-25: 300 ug via INTRAMUSCULAR

## 2017-11-25 NOTE — Patient Instructions (Signed)
Monica Cox, I greatly value your feedback.  If you receive a survey following your visit with us today, we appreciate you taking the time to fill it out.  Thanks, Monica Cox, CNM, WHNP-BC   Call the office [phone]) or go to [facility] if:  You begin to have strong, frequent contractions  Your water breaks.  Sometimes it is a big gush of fluid, sometimes it is just a trickle that keeps getting your panties wet or running down your legs  You have vaginal bleeding.  It is normal to have a small amount of spotting if your cervix was checked.   You don't feel your baby moving like normal.  If you don't, get you something to eat and drink and lay down and focus on feeling your baby move.  You should feel at least 10 movements in 2 hours.  If you don't, you should call the office or [facility].    Tdap Vaccine  It is recommended that you get the Tdap vaccine during the third trimester of EACH pregnancy to help protect your baby from getting pertussis (whooping cough)  27-36 weeks is the BEST time to do this so that you can pass the protection on to your baby. During pregnancy is better than after pregnancy, but if you are unable to get it during pregnancy it will be offered at the hospital.   You can get this vaccine at the health department or your family doctor  Everyone who will be around your baby should also be up-to-date on their vaccines. Adults (who are not pregnant) only need 1 dose of Tdap during adulthood.   Third Trimester of Pregnancy The third trimester is from week 29 through week 42, months 7 through 9. The third trimester is a time when the fetus is growing rapidly. At the end of the ninth month, the fetus is about 20 inches in length and weighs 6-10 pounds.  BODY CHANGES Your body goes through many changes during pregnancy. The changes vary from woman to woman.   Your weight will continue to increase. You can expect to gain 25-35 pounds (11-16 kg) by  the end of the pregnancy.  You may begin to get stretch marks on your hips, abdomen, and breasts.  You may urinate more often because the fetus is moving lower into your pelvis and pressing on your bladder.  You may develop or continue to have heartburn as a result of your pregnancy.  You may develop constipation because certain hormones are causing the muscles that push waste through your intestines to slow down.  You may develop hemorrhoids or swollen, bulging veins (varicose veins).  You may have pelvic pain because of the weight gain and pregnancy hormones relaxing your joints between the bones in your pelvis. Backaches may result from overexertion of the muscles supporting your posture.  You may have changes in your hair. These can include thickening of your hair, rapid growth, and changes in texture. Some women also have hair loss during or after pregnancy, or hair that feels dry or thin. Your hair will most likely return to normal after your baby is born.  Your breasts will continue to grow and be tender. A yellow discharge may leak from your breasts called colostrum.  Your belly button may stick out.  You may feel short of breath because of your expanding uterus.  You may notice the fetus "dropping," or moving lower in your abdomen.  You may have a bloody  mucus discharge. This usually occurs a few days to a week before labor begins.  Your cervix becomes thin and soft (effaced) near your due date. WHAT TO EXPECT AT YOUR PRENATAL EXAMS  You will have prenatal exams every 2 weeks until week 36. Then, you will have weekly prenatal exams. During a routine prenatal visit:  You will be weighed to make sure you and the fetus are growing normally.  Your blood pressure is taken.  Your abdomen will be measured to track your baby's growth.  The fetal heartbeat will be listened to.  Any test results from the previous visit will be discussed.  You may have a cervical check near your  due date to see if you have effaced. At around 36 weeks, your caregiver will check your cervix. At the same time, your caregiver will also perform a test on the secretions of the vaginal tissue. This test is to determine if a type of bacteria, Group B streptococcus, is present. Your caregiver will explain this further. Your caregiver may ask you:  What your birth plan is.  How you are feeling.  If you are feeling the baby move.  If you have had any abnormal symptoms, such as leaking fluid, bleeding, severe headaches, or abdominal cramping.  If you have any questions. Other tests or screenings that may be performed during your third trimester include:  Blood tests that check for low iron levels (anemia).  Fetal testing to check the health, activity level, and growth of the fetus. Testing is done if you have certain medical conditions or if there are problems during the pregnancy. FALSE LABOR You may feel small, irregular contractions that eventually go away. These are called Braxton Hicks contractions, or false labor. Contractions may last for hours, days, or even weeks before true labor sets in. If contractions come at regular intervals, intensify, or become painful, it is best to be seen by your caregiver.  SIGNS OF LABOR   Menstrual-like cramps.  Contractions that are 5 minutes apart or less.  Contractions that start on the top of the uterus and spread down to the lower abdomen and back.  A sense of increased pelvic pressure or back pain.  A watery or bloody mucus discharge that comes from the vagina. If you have any of these signs before the 37th week of pregnancy, call your caregiver right away. You need to go to the hospital to get checked immediately. HOME CARE INSTRUCTIONS   Avoid all smoking, herbs, alcohol, and unprescribed drugs. These chemicals affect the formation and growth of the baby.  Follow your caregiver's instructions regarding medicine use. There are medicines  that are either safe or unsafe to take during pregnancy.  Exercise only as directed by your caregiver. Experiencing uterine cramps is a good sign to stop exercising.  Continue to eat regular, healthy meals.  Wear a good support bra for breast tenderness.  Do not use hot tubs, steam rooms, or saunas.  Wear your seat belt at all times when driving.  Avoid raw meat, uncooked cheese, cat litter boxes, and soil used by cats. These carry germs that can cause birth defects in the baby.  Take your prenatal vitamins.  Try taking a stool softener (if your caregiver approves) if you develop constipation. Eat more high-fiber foods, such as fresh vegetables or fruit and whole grains. Drink plenty of fluids to keep your urine clear or pale yellow.  Take warm sitz baths to soothe any pain or discomfort caused by hemorrhoids.  Use hemorrhoid cream if your caregiver approves.  If you develop varicose veins, wear support hose. Elevate your feet for 15 minutes, 3-4 times a day. Limit salt in your diet.  Avoid heavy lifting, wear low heal shoes, and practice good posture.  Rest a lot with your legs elevated if you have leg cramps or low back pain.  Visit your dentist if you have not gone during your pregnancy. Use a soft toothbrush to brush your teeth and be gentle when you floss.  A sexual relationship may be continued unless your caregiver directs you otherwise.  Do not travel far distances unless it is absolutely necessary and only with the approval of your caregiver.  Take prenatal classes to understand, practice, and ask questions about the labor and delivery.  Make a trial run to the hospital.  Pack your hospital bag.  Prepare the baby's nursery.  Continue to go to all your prenatal visits as directed by your caregiver. SEEK MEDICAL CARE IF:  You are unsure if you are in labor or if your water has broken.  You have dizziness.  You have mild pelvic cramps, pelvic pressure, or nagging  pain in your abdominal area.  You have persistent nausea, vomiting, or diarrhea.  You have a bad smelling vaginal discharge.  You have pain with urination. SEEK IMMEDIATE MEDICAL CARE IF:   You have a fever.  You are leaking fluid from your vagina.  You have spotting or bleeding from your vagina.  You have severe abdominal cramping or pain.  You have rapid weight loss or gain.  You have shortness of breath with chest pain.  You notice sudden or extreme swelling of your face, hands, ankles, feet, or legs.  You have not felt your baby move in over an hour.  You have severe headaches that do not go away with medicine.  You have vision changes. Document Released: 09/16/2001 Document Revised: 09/27/2013 Document Reviewed: 11/23/2012 [employer] Patient Information 2015 [employer]. This information is not intended to replace advice given to you by your health care provider. Make sure you discuss any questions you have with your health care provider.   

## 2017-11-25 NOTE — Addendum Note (Signed)
Addended by: Colen DarlingYOUNG, Courtni Balash S on: 11/25/2017 04:50 PM   Modules accepted: Orders, SmartSet

## 2017-11-25 NOTE — Progress Notes (Signed)
   LOW-RISK PREGNANCY VISIT Patient name: Monica Cox MRN [id number]  Date of birth: 05/03/1991 Chief Complaint:   Routine Prenatal Visit  History of Present Illness:   Monica Cox is a 27 y.o. [id number] female at 9648w2d with an Estimated Date of Delivery: 01/18/18 being seen today for ongoing management of a low-risk pregnancy.  Today she reports no complaints. Contractions: Irregular. Vag. Bleeding: None.  Movement: Present. denies leaking of fluid. Review of Systems:   Pertinent items are noted in HPI Denies abnormal vaginal discharge w/ itching/odor/irritation, headaches, visual changes, shortness of breath, chest pain, abdominal pain, severe nausea/vomiting, or problems with urination or bowel movements unless otherwise stated above. Pertinent History Reviewed:  Reviewed past medical,surgical, social, obstetrical and family history.  Reviewed problem list, medications and allergies. Physical Assessment:   Vitals:   11/25/17 1631  BP: 110/60  Pulse: 84  Weight: 203 lb (92.1 kg)  Body mass index is 35.4 kg/m.        Physical Examination:   General appearance: Well appearing, and in no distress  Mental status: Alert, oriented to person, place, and time  Skin: Warm & dry  Cardiovascular: Normal heart rate noted  Respiratory: Normal respiratory effort, no distress  Abdomen: Soft, gravid, nontender  Pelvic: Cervical exam deferred         Extremities: Edema: Trace  Fetal Status: Fetal Heart Rate (bpm): 148 Fundal Height: 32 cm Movement: Present    Results for orders placed or performed in visit on 11/25/17 (from the past 24 hour(s))  POCT urinalysis dipstick   Collection Time: 11/25/17  4:32 PM  Result Value Ref Range   Color, UA     Clarity, UA     Glucose, UA neg    Bilirubin, UA     Ketones, UA neg    Spec Grav, UA  1.010 - 1.025   Blood, UA neg    pH, UA  5.0 - 8.0   Protein, UA trace    Urobilinogen, UA  0.2 or 1.0 E.U./dL   Nitrite, UA neg    Leukocytes,  UA Small (1+) (A) Negative   Appearance     Odor      Assessment & Plan:  1) Low-risk pregnancy [id number] at 5848w2d with an Estimated Date of Delivery: 01/18/18   2) Rh neg, rhogam today   Meds: No orders of the defined types were placed in this encounter.  Labs/procedures today: rhogam  Plan:  Continue routine obstetrical care   Reviewed: Preterm labor symptoms and general obstetric precautions including but not limited to vaginal bleeding, contractions, leaking of fluid and fetal movement were reviewed in detail with the patient.  Recommended Tdap at HD/PCP per CDC guidelines. All questions were answered  Follow-up: Return in about 2 weeks (around 12/09/2017) for LROB.  Orders Placed This Encounter  Procedures  . POCT urinalysis dipstick   Cheral MarkerKimberly R Kena Limon CNM, [facility] 11/25/2017 4:40 PM

## 2017-12-09 ENCOUNTER — Ambulatory Visit (INDEPENDENT_AMBULATORY_CARE_PROVIDER_SITE_OTHER): Payer: Medicaid Other | Admitting: Women's Health

## 2017-12-09 ENCOUNTER — Encounter: Payer: Self-pay | Admitting: Women's Health

## 2017-12-09 VITALS — BP 110/60 | HR 98 | Wt 199.8 lb

## 2017-12-09 DIAGNOSIS — Z3A34 34 weeks gestation of pregnancy: Secondary | ICD-10-CM

## 2017-12-09 DIAGNOSIS — Z3483 Encounter for supervision of other normal pregnancy, third trimester: Secondary | ICD-10-CM

## 2017-12-09 DIAGNOSIS — Z1389 Encounter for screening for other disorder: Secondary | ICD-10-CM

## 2017-12-09 DIAGNOSIS — R82998 Other abnormal findings in urine: Secondary | ICD-10-CM

## 2017-12-09 DIAGNOSIS — Z331 Pregnant state, incidental: Secondary | ICD-10-CM

## 2017-12-09 DIAGNOSIS — O9989 Other specified diseases and conditions complicating pregnancy, childbirth and the puerperium: Secondary | ICD-10-CM

## 2017-12-09 LAB — POCT URINALYSIS DIPSTICK
Glucose, UA: NEGATIVE
NITRITE UA: NEGATIVE
RBC UA: NEGATIVE

## 2017-12-09 NOTE — Patient Instructions (Signed)
[name], I greatly value your feedback.  If you receive a survey following your visit with us today, we appreciate you taking the time to fill it out.  Thanks, Joellyn HaffKim Barack Nicodemus, CNM, WHNP-BC   Call the office [phone]) or go to [facility] if:  You begin to have strong, frequent contractions  Your water breaks.  Sometimes it is a big gush of fluid, sometimes it is just a trickle that keeps getting your panties wet or running down your legs  You have vaginal bleeding.  It is normal to have a small amount of spotting if your cervix was checked.   You don't feel your baby moving like normal.  If you don't, get you something to eat and drink and lay down and focus on feeling your baby move.  You should feel at least 10 movements in 2 hours.  If you don't, you should call the office or go to [facility].     Preterm Labor and Birth Information The normal length of a pregnancy is 39-41 weeks. Preterm labor is when labor starts before 37 completed weeks of pregnancy. What are the risk factors for preterm labor? Preterm labor is more likely to occur in women who:  Have certain infections during pregnancy such as a bladder infection, sexually transmitted infection, or infection inside the uterus (chorioamnionitis).  Have a shorter-than-normal cervix.  Have gone into preterm labor before.  Have had surgery on their cervix.  Are younger than age 29 or older than age 29.  Are African American.  Are pregnant with twins or multiple babies (multiple gestation).  Take street drugs or smoke while pregnant.  Do not gain enough weight while pregnant.  Became pregnant shortly after having been pregnant.  What are the symptoms of preterm labor? Symptoms of preterm labor include:  Cramps similar to those that can happen during a menstrual period. The cramps may happen with diarrhea.  Pain in the abdomen or lower back.  Regular uterine contractions that may feel like tightening  of the abdomen.  A feeling of increased pressure in the pelvis.  Increased watery or bloody mucus discharge from the vagina.  Water breaking (ruptured amniotic sac).  Why is it important to recognize signs of preterm labor? It is important to recognize signs of preterm labor because babies who are born prematurely may not be fully developed. This can put them at an increased risk for:  Long-term (chronic) heart and lung problems.  Difficulty immediately after birth with regulating body systems, including blood sugar, body temperature, heart rate, and breathing rate.  Bleeding in the brain.  Cerebral palsy.  Learning difficulties.  Death.  These risks are highest for babies who are born before 34 weeks of pregnancy. How is preterm labor treated? Treatment depends on the length of your pregnancy, your condition, and the health of your baby. It may involve:  Having a stitch (suture) placed in your cervix to prevent your cervix from opening too early (cerclage).  Taking or being given medicines, such as: ? Hormone medicines. These may be given early in pregnancy to help support the pregnancy. ? Medicine to stop contractions. ? Medicines to help mature the baby's lungs. These may be prescribed if the risk of delivery is high. ? Medicines to prevent your baby from developing cerebral palsy.  If the labor happens before 34 weeks of pregnancy, you may need to stay in the hospital. What should I do if I think I am in preterm labor? If  you think that you are going into preterm labor, call your health care provider right away. How can I prevent preterm labor in future pregnancies? To increase your chance of having a full-term pregnancy:  Do not use any tobacco products, such as cigarettes, chewing tobacco, and e-cigarettes. If you need help quitting, ask your health care provider.  Do not use street drugs or medicines that have not been prescribed to you during your pregnancy.  Talk  with your health care provider before taking any herbal supplements, even if you have been taking them regularly.  Make sure you gain a healthy amount of weight during your pregnancy.  Watch for infection. If you think that you might have an infection, get it checked right away.  Make sure to tell your health care provider if you have gone into preterm labor before.  This information is not intended to replace advice given to you by your health care provider. Make sure you discuss any questions you have with your health care provider. Document Released: 12/13/2003 Document Revised: 03/04/2016 Document Reviewed: 02/13/2016 Elsevier Interactive Patient Education  2018 [employer].

## 2017-12-09 NOTE — Progress Notes (Signed)
   LOW-RISK PREGNANCY VISIT Patient name: Monica Cox MRN [id number]  Date of birth: [birth year] Chief Complaint:   Routine Prenatal Visit  History of Present Illness:   Monica Cox is a 27 y.o. [id number] female at 8059w2d with an Estimated Date of Delivery: 01/18/18 being seen today for ongoing management of a low-risk pregnancy.  Today she reports no complaints. Contractions: Irregular.  .  Movement: Present. denies leaking of fluid. Review of Systems:   Pertinent items are noted in HPI Denies abnormal vaginal discharge w/ itching/odor/irritation, headaches, visual changes, shortness of breath, chest pain, abdominal pain, severe nausea/vomiting, or problems with urination or bowel movements unless otherwise stated above. Pertinent History Reviewed:  Reviewed past medical,surgical, social, obstetrical and family history.  Reviewed problem list, medications and allergies. Physical Assessment:   Vitals:   12/09/17 1634  BP: 110/60  Pulse: 98  Weight: 199 lb 12.8 oz (90.6 kg)  Body mass index is 34.84 kg/m.        Physical Examination:   General appearance: Well appearing, and in no distress  Mental status: Alert, oriented to person, place, and time  Skin: Warm & dry  Cardiovascular: Normal heart rate noted  Respiratory: Normal respiratory effort, no distress  Abdomen: Soft, gravid, nontender  Pelvic: Cervical exam deferred         Extremities: Edema: None  Fetal Status: Fetal Heart Rate (bpm): 154 Fundal Height: 34 cm Movement: Present    Results for orders placed or performed in visit on 12/09/17 (from the past 24 hour(s))  POCT urinalysis dipstick   Collection Time: 12/09/17  4:38 PM  Result Value Ref Range   Color, UA     Clarity, UA     Glucose, UA neg    Bilirubin, UA     Ketones, UA small    Spec Grav, UA  1.010 - 1.025   Blood, UA neg    pH, UA  5.0 - 8.0   Protein, UA trace    Urobilinogen, UA  0.2 or 1.0 E.U./dL   Nitrite, UA neg    Leukocytes, UA  Moderate (2+) (A) Negative   Appearance     Odor      Assessment & Plan:  1) Low-risk pregnancy [id number] at 6059w2d with an Estimated Date of Delivery: 01/18/18   2) Leuk/protein in urine, will send cx   Meds: No orders of the defined types were placed in this encounter.  Labs/procedures today: urine cx  Plan:  Continue routine obstetrical care   Reviewed: Preterm labor symptoms and general obstetric precautions including but not limited to vaginal bleeding, contractions, leaking of fluid and fetal movement were reviewed in detail with the patient.  All questions were answered  Follow-up: Return in about 2 weeks (around 12/23/2017) for LROB.  Orders Placed This Encounter  Procedures  . Urine Culture  . POCT urinalysis dipstick   Cheral MarkerKimberly R Johnston Maddocks CNM, [facility] 12/09/2017 4:49 PM

## 2017-12-11 LAB — URINE CULTURE

## 2017-12-23 ENCOUNTER — Ambulatory Visit (INDEPENDENT_AMBULATORY_CARE_PROVIDER_SITE_OTHER): Payer: Medicaid Other | Admitting: Advanced Practice Midwife

## 2017-12-23 ENCOUNTER — Encounter: Payer: Self-pay | Admitting: Advanced Practice Midwife

## 2017-12-23 VITALS — BP 120/60 | HR 94 | Wt 205.4 lb

## 2017-12-23 DIAGNOSIS — Z331 Pregnant state, incidental: Secondary | ICD-10-CM

## 2017-12-23 DIAGNOSIS — Z1389 Encounter for screening for other disorder: Secondary | ICD-10-CM

## 2017-12-23 DIAGNOSIS — Z3A36 36 weeks gestation of pregnancy: Secondary | ICD-10-CM

## 2017-12-23 DIAGNOSIS — Z3483 Encounter for supervision of other normal pregnancy, third trimester: Secondary | ICD-10-CM

## 2017-12-23 LAB — POCT URINALYSIS DIPSTICK
Blood, UA: NEGATIVE
GLUCOSE UA: NEGATIVE
Ketones, UA: NEGATIVE
Leukocytes, UA: NEGATIVE
Nitrite, UA: NEGATIVE

## 2017-12-23 NOTE — Patient Instructions (Signed)

## 2017-12-23 NOTE — Progress Notes (Signed)
  [id number] 9792w2d Estimated Date of Delivery: 01/18/18  Blood pressure 120/60, pulse 94, weight 205 lb 6.4 oz (93.2 kg), last menstrual period 04/13/2017, not currently breastfeeding.   BP weight and urine results all reviewed and noted.  Please refer to the obstetrical flow sheet for the fundal height and fetal heart rate documentation:  Patient reports good fetal movement, denies any bleeding and no rupture of membranes symptoms or regular contractions. Patient is without complaints. All questions were answered.   Physical Assessment:   Vitals:   12/23/17 1622  BP: 120/60  Pulse: 94  Weight: 205 lb 6.4 oz (93.2 kg)  Body mass index is 35.81 kg/m.        Physical Examination:   General appearance: Well appearing, and in no distress  Mental status: Alert, oriented to person, place, and time  Skin: Warm & dry  Cardiovascular: Normal heart rate noted  Respiratory: Normal respiratory effort, no distress  Abdomen: Soft, gravid, nontender  Pelvic: Cervical exam deferred         Extremities: Edema: Trace  Fetal Status:     Movement: Present    Results for orders placed or performed in visit on 12/23/17 (from the past 24 hour(s))  POCT urinalysis dipstick   Collection Time: 12/23/17  4:33 PM  Result Value Ref Range   Color, UA     Clarity, UA     Glucose, UA neg    Bilirubin, UA     Ketones, UA neg    Spec Grav, UA  1.010 - 1.025   Blood, UA neg    pH, UA  5.0 - 8.0   Protein, UA trace    Urobilinogen, UA  0.2 or 1.0 E.U./dL   Nitrite, UA neg    Leukocytes, UA Negative Negative   Appearance     Odor       Orders Placed This Encounter  Procedures  . POCT urinalysis dipstick    Plan:  Continued routine obstetrical care,   Return in about 1 week (around 12/30/2017).

## 2018-01-01 ENCOUNTER — Encounter: Payer: Medicaid Other | Admitting: Obstetrics and Gynecology

## 2018-01-07 ENCOUNTER — Ambulatory Visit (INDEPENDENT_AMBULATORY_CARE_PROVIDER_SITE_OTHER): Payer: Medicaid Other | Admitting: Advanced Practice Midwife

## 2018-01-07 VITALS — BP 112/66 | HR 92 | Wt 203.0 lb

## 2018-01-07 DIAGNOSIS — Z331 Pregnant state, incidental: Secondary | ICD-10-CM

## 2018-01-07 DIAGNOSIS — O1213 Gestational proteinuria, third trimester: Secondary | ICD-10-CM | POA: Diagnosis not present

## 2018-01-07 DIAGNOSIS — Z1389 Encounter for screening for other disorder: Secondary | ICD-10-CM

## 2018-01-07 DIAGNOSIS — Z3A38 38 weeks gestation of pregnancy: Secondary | ICD-10-CM | POA: Diagnosis not present

## 2018-01-07 DIAGNOSIS — Z3483 Encounter for supervision of other normal pregnancy, third trimester: Secondary | ICD-10-CM

## 2018-01-07 LAB — POCT URINALYSIS DIPSTICK
Blood, UA: NEGATIVE
Glucose, UA: NEGATIVE
KETONES UA: NEGATIVE
Leukocytes, UA: NEGATIVE
NITRITE UA: NEGATIVE

## 2018-01-07 NOTE — Progress Notes (Signed)
  [id number] 7169w3d Estimated Date of Delivery: 01/18/18  Blood pressure 112/66, pulse 92, weight 92.1 kg (203 lb), last menstrual period 04/13/2017, not currently breastfeeding.   BP weight and urine results all reviewed and noted.  Please refer to the obstetrical flow sheet for the fundal height and fetal heart rate documentation:  Patient reports good fetal movement, denies any bleeding and no rupture of membranes symptoms or regular contractions. Patient is without complaints. All questions were answered.   Physical Assessment:   Vitals:   01/07/18 1635  BP: 112/66  Pulse: 92  Weight: 92.1 kg (203 lb)  Body mass index is 35.4 kg/m.        Physical Examination:   General appearance: Well appearing, and in no distress  Mental status: Alert, oriented to person, place, and time  Skin: Warm & dry  Cardiovascular: Normal heart rate noted  Respiratory: Normal respiratory effort, no distress  Abdomen: Soft, gravid, nontender  Pelvic: Cervical exam performed         Extremities: Edema: Trace  Fetal Status:     Movement: Present    Results for orders placed or performed in visit on 01/07/18 (from the past 24 hour(s))  POCT Urinalysis Dipstick   Collection Time: 01/07/18  4:38 PM  Result Value Ref Range   Color, UA     Clarity, UA     Glucose, UA neg    Bilirubin, UA     Ketones, UA neg    Spec Grav, UA  1.010 - 1.025   Blood, UA neg    pH, UA  5.0 - 8.0   Protein, UA 1+    Urobilinogen, UA  0.2 or 1.0 E.U./dL   Nitrite, UA neg    Leukocytes, UA Negative Negative   Appearance     Odor       Orders Placed This Encounter  Procedures  . Strep Gp B NAA  . GC/Chlamydia Probe Amp([facility])  . Urine Culture  . POCT Urinalysis Dipstick    Plan:  Continued routine obstetrical care, will try stripping membranes, schedule IOL for Easter per pt request (40.6)  Return in about 6 days (around 01/13/2018) for LROB.

## 2018-01-10 ENCOUNTER — Encounter (HOSPITAL_COMMUNITY): Payer: Self-pay | Admitting: *Deleted

## 2018-01-10 ENCOUNTER — Other Ambulatory Visit: Payer: Self-pay

## 2018-01-10 ENCOUNTER — Inpatient Hospital Stay (HOSPITAL_COMMUNITY)
Admission: AD | Admit: 2018-01-10 | Discharge: 2018-01-15 | DRG: 806 | Disposition: A | Discharge status: Home / Self Care | Payer: Medicaid Other | Source: Ambulatory Visit | Attending: Obstetrics & Gynecology | Admitting: Obstetrics & Gynecology

## 2018-01-10 DIAGNOSIS — D649 Anemia, unspecified: Secondary | ICD-10-CM | POA: Diagnosis present

## 2018-01-10 DIAGNOSIS — O9902 Anemia complicating childbirth: Secondary | ICD-10-CM | POA: Diagnosis present

## 2018-01-10 DIAGNOSIS — O99324 Drug use complicating childbirth: Secondary | ICD-10-CM | POA: Diagnosis present

## 2018-01-10 DIAGNOSIS — F329 Major depressive disorder, single episode, unspecified: Secondary | ICD-10-CM | POA: Diagnosis present

## 2018-01-10 DIAGNOSIS — Z3A38 38 weeks gestation of pregnancy: Secondary | ICD-10-CM | POA: Diagnosis not present

## 2018-01-10 DIAGNOSIS — Z23 Encounter for immunization: Secondary | ICD-10-CM

## 2018-01-10 DIAGNOSIS — Z3483 Encounter for supervision of other normal pregnancy, third trimester: Secondary | ICD-10-CM

## 2018-01-10 DIAGNOSIS — Z6791 Unspecified blood type, Rh negative: Secondary | ICD-10-CM

## 2018-01-10 DIAGNOSIS — O4292 Full-term premature rupture of membranes, unspecified as to length of time between rupture and onset of labor: Principal | ICD-10-CM | POA: Diagnosis present

## 2018-01-10 DIAGNOSIS — O4290 Premature rupture of membranes, unspecified as to length of time between rupture and onset of labor, unspecified weeks of gestation: Secondary | ICD-10-CM | POA: Diagnosis present

## 2018-01-10 DIAGNOSIS — O99344 Other mental disorders complicating childbirth: Secondary | ICD-10-CM | POA: Diagnosis present

## 2018-01-10 DIAGNOSIS — J45909 Unspecified asthma, uncomplicated: Secondary | ICD-10-CM | POA: Diagnosis present

## 2018-01-10 DIAGNOSIS — F419 Anxiety disorder, unspecified: Secondary | ICD-10-CM | POA: Diagnosis present

## 2018-01-10 DIAGNOSIS — O09299 Supervision of pregnancy with other poor reproductive or obstetric history, unspecified trimester: Secondary | ICD-10-CM

## 2018-01-10 DIAGNOSIS — O26899 Other specified pregnancy related conditions, unspecified trimester: Secondary | ICD-10-CM

## 2018-01-10 DIAGNOSIS — O1495 Unspecified pre-eclampsia, complicating the puerperium: Secondary | ICD-10-CM | POA: Diagnosis present

## 2018-01-10 DIAGNOSIS — Z87891 Personal history of nicotine dependence: Secondary | ICD-10-CM | POA: Diagnosis not present

## 2018-01-10 DIAGNOSIS — Z3490 Encounter for supervision of normal pregnancy, unspecified, unspecified trimester: Secondary | ICD-10-CM

## 2018-01-10 DIAGNOSIS — O26893 Other specified pregnancy related conditions, third trimester: Secondary | ICD-10-CM | POA: Diagnosis present

## 2018-01-10 DIAGNOSIS — O9952 Diseases of the respiratory system complicating childbirth: Secondary | ICD-10-CM | POA: Diagnosis present

## 2018-01-10 DIAGNOSIS — F1290 Cannabis use, unspecified, uncomplicated: Secondary | ICD-10-CM | POA: Diagnosis present

## 2018-01-10 HISTORY — DX: Unspecified asthma, uncomplicated: J45.909

## 2018-01-10 LAB — URINALYSIS, ROUTINE W REFLEX MICROSCOPIC
BACTERIA UA: NONE SEEN
BILIRUBIN URINE: NEGATIVE
Glucose, UA: NEGATIVE mg/dL
HGB URINE DIPSTICK: NEGATIVE
Ketones, ur: 5 mg/dL — AB
NITRITE: NEGATIVE
PROTEIN: NEGATIVE mg/dL
Specific Gravity, Urine: 1.019 (ref 1.005–1.030)
pH: 6 (ref 5.0–8.0)

## 2018-01-10 LAB — CBC
HEMATOCRIT: 29.2 % — AB (ref 36.0–46.0)
HEMOGLOBIN: 9.8 g/dL — AB (ref 12.0–15.0)
MCH: 28.4 pg (ref 26.0–34.0)
MCHC: 33.6 g/dL (ref 30.0–36.0)
MCV: 84.6 fL (ref 78.0–100.0)
Platelets: 251 10*3/uL (ref 150–400)
RBC: 3.45 MIL/uL — ABNORMAL LOW (ref 3.87–5.11)
RDW: 14.1 % (ref 11.5–15.5)
WBC: 7.1 10*3/uL (ref 4.0–10.5)

## 2018-01-10 LAB — POCT FERN TEST: POCT Fern Test: POSITIVE

## 2018-01-10 LAB — URINE CULTURE

## 2018-01-10 LAB — STREP GP B NAA: STREP GROUP B AG: NEGATIVE

## 2018-01-10 MED ORDER — SOD CITRATE-CITRIC ACID 500-334 MG/5ML PO SOLN: 30.0000 mL | ORAL | Status: DC | PRN

## 2018-01-10 MED ORDER — OXYTOCIN 40 UNITS IN LACTATED RINGERS INFUSION - SIMPLE MED
2.5000 [IU]/h | INTRAVENOUS | Status: DC
  Administered 2018-01-11: 2.5 [IU]/h via INTRAVENOUS
  Filled 2018-01-10: qty 1000

## 2018-01-10 MED ORDER — ONDANSETRON HCL 4 MG/2ML IJ SOLN
4.0000 mg | Freq: Four times a day (QID) | INTRAMUSCULAR | Status: DC | PRN
  Administered 2018-01-11: 4 mg via INTRAVENOUS
  Filled 2018-01-10: qty 2

## 2018-01-10 MED ORDER — LIDOCAINE HCL (PF) 1 % IJ SOLN
30.0000 mL | INTRAMUSCULAR | Status: AC | PRN
  Administered 2018-01-11: 30 mL via SUBCUTANEOUS
  Filled 2018-01-10: qty 30

## 2018-01-10 MED ORDER — OXYCODONE-ACETAMINOPHEN 5-325 MG PO TABS: 2.0000 | ORAL_TABLET | ORAL | Status: DC | PRN

## 2018-01-10 MED ORDER — LACTATED RINGERS IV SOLN
INTRAVENOUS | Status: DC
  Administered 2018-01-10 (×2): via INTRAVENOUS

## 2018-01-10 MED ORDER — ACETAMINOPHEN 325 MG PO TABS: 650.0000 mg | ORAL_TABLET | ORAL | Status: DC | PRN

## 2018-01-10 MED ORDER — OXYTOCIN BOLUS FROM INFUSION
500.0000 mL | Freq: Once | INTRAVENOUS | Status: AC
  Administered 2018-01-11: 500 mL via INTRAVENOUS

## 2018-01-10 MED ORDER — LACTATED RINGERS IV SOLN: 500.0000 mL | INTRAVENOUS | Status: DC | PRN

## 2018-01-10 MED ORDER — OXYCODONE-ACETAMINOPHEN 5-325 MG PO TABS: 1.0000 | ORAL_TABLET | ORAL | Status: DC | PRN

## 2018-01-10 NOTE — H&P (Signed)
OBSTETRIC ADMISSION HISTORY AND PHYSICAL  Monica Cox is a 27 y.o. female [id number] with IUP at 1964w6d presenting for SROM @1845 . She reports +FMs, No LOF, no VB, no blurry vision, headaches or peripheral edema, and RUQ pain. She plans on breast and bottlefeeding. She's undecided for birth control.  Dating: By LMP and 9wk [place] --->  Estimated Date of Delivery: 01/18/18  Sono:    @[redacted]w[redacted]d , CWD, normal anatomy  Prenatal History/Complications: -hx of postpartum pre-e w/G2 and G3 -Rh negative -hx anxiety and depression on Lexapro  Past Medical History: Past Medical History:  Diagnosis Date  . Anemia   . Asthma    inhaler as needed  . H/O trichomonas 08/08/2014   2015 pregnancy   . History of chlamydia 03/19/2016   2015 pregnancy   . Hypertension    pp htn  . Nausea 02/06/2016  . Reflux   . Smoker 02/06/2016  . Trichomonas infection 08-08-2014  . Vaginal Pap smear, abnormal   . Vertigo 09/21/2014   antivert rx 09/21/2014      Past Surgical History: Past Surgical History:  Procedure Laterality Date  . DILATION AND CURETTAGE OF UTERUS     [facility]AB Dr. Despina HiddenEure performed  . ESOPHAGOGASTRODUODENOSCOPY ENDOSCOPY      Obstetrical History: OB History    Gravida  5   Para  3   Term  3   Preterm  0   AB  1   Living  3     SAB  1   TAB  0   Ectopic  0   Multiple  0   Live Births  3           Social History: Social History   Socioeconomic History  . Marital status: Married    Spouse name: Not on file  . Number of children: 1  . Years of education: Not on file  . Highest education level: Not on file  Occupational History  . Occupation: [employer]    Employer: [employer]    Comment: respiratory therapy  Social Needs  . Financial resource strain: Not on file  . Food insecurity:    Worry: Not on file    Inability: Not on file  . Transportation needs:    Medical: Not on file    Non-medical: Not on file  Tobacco Use  . Smoking status: Former Smoker    Years:  3.00    Types: Cigars  . Smokeless tobacco: Never Used  Substance and Sexual Activity  . Alcohol use: No    Alcohol/week: 0.0 oz    Comment: occasionally; not now  . Drug use: No  . Sexual activity: Yes    Birth control/protection: None  Lifestyle  . Physical activity:    Days per week: Not on file    Minutes per session: Not on file  . Stress: Not on file  Relationships  . Social connections:    Talks on phone: Not on file    Gets together: Not on file    Attends religious service: Not on file    Active member of club or organization: Not on file    Attends meetings of clubs or organizations: Not on file    Relationship status: Not on file  Other Topics Concern  . Not on file  Social History Narrative  . Not on file    Family History: Family History  Problem Relation Age of Onset  . Ulcers Father   . Hypertension Father   .  Hypertension Mother   . Ulcers Paternal Grandmother   . Diabetes Paternal Grandmother   . Ulcers Paternal Aunt   . Liver disease Neg Hx   . Colon cancer Neg Hx     Allergies: No Known Allergies  Medications Prior to Admission  Medication Sig Dispense Refill Last Dose  . BABY ASPIRIN PO Take by mouth daily.   01/10/2018 at Unknown time  . escitalopram (LEXAPRO) 10 MG tablet Take 1 tablet (10 mg total) by mouth daily. 30 tablet 6 01/09/2018 at Unknown time  . Prenat-FeCbn-FeAspGl-FA-Omega (OB COMPLETE PETITE) 35-5-1-200 MG CAPS TAKE 1 CAPSULE EVERY DAY 30 capsule 12 Past Month at Unknown time  . promethazine (PHENERGAN) 12.5 MG tablet TAKE 1 TABLET BY MOUTH EVERY 6 HOURS AS NEEDED FOR NAUSEA OR VOMITING. 30 tablet 3 Past Week at Unknown time  . acetaminophen (TYLENOL) 500 MG tablet Take 1,000 mg by mouth as needed.   More than a month at Unknown time  . albuterol (PROVENTIL HFA;VENTOLIN HFA) 108 (90 Base) MCG/ACT inhaler Inhale 1-2 puffs into the lungs every 4 (four) hours as needed for wheezing or shortness of breath. 1 Inhaler 0 More than a month  at Unknown time     Review of Systems   All systems reviewed and negative except as stated in HPI  Blood pressure 137/77, pulse 75, temperature 98.7 F (37.1 C), resp. rate 18, height 5' 3.5" (1.613 m), weight 203 lb (92.1 kg), last menstrual period 04/13/2017, SpO2 100 %, not currently breastfeeding. General appearance: alert, cooperative and no distress Lungs: regular rate and effort Heart: regular rate  Abdomen: soft, non-tender Extremities: Homans sign is negative, no sign of DVT Presentation: cephalic Fetal monitoringBaseline: 140 bpm, Variability: Good {> 6 bpm), Accelerations: Reactive and Decelerations: Absent Uterine activity irregular Exam by:: Jullia Mulligan CNM  Prenatal labs: ABO, Rh:  O neg Antibody: Negative (01/23 0934) Rubella:  Immune (2017) RPR: Non Reactive (01/23 0934)  HBsAg: Negative (09/20 1620)  HIV: Non Reactive (01/23 0934)  GBS: Negative (04/05 1204)  2 hr Glucola nml Genetic screening  nml Anatomy US nml  Prenatal Transfer Tool  Maternal Diabetes: No Genetic Screening: Normal Maternal Ultrasounds/Referrals: Normal Fetal Ultrasounds or other Referrals:  None Maternal Substance Abuse:  No Significant Maternal Medications:  Meds include: Other: Lexapro Significant Maternal Lab Results: None  Results for orders placed or performed during the hospital encounter of 01/10/18 (from the past 24 hour(s))  Urinalysis, Routine w reflex microscopic   Collection Time: 01/10/18  8:30 PM  Result Value Ref Range   Color, Urine YELLOW YELLOW   APPearance CLEAR CLEAR   Specific Gravity, Urine 1.019 1.005 - 1.030   pH 6.0 5.0 - 8.0   Glucose, UA NEGATIVE NEGATIVE mg/dL   Hgb urine dipstick NEGATIVE NEGATIVE   Bilirubin Urine NEGATIVE NEGATIVE   Ketones, ur 5 (A) NEGATIVE mg/dL   Protein, ur NEGATIVE NEGATIVE mg/dL   Nitrite NEGATIVE NEGATIVE   Leukocytes, UA SMALL (A) NEGATIVE   RBC / HPF 0-5 0 - 5 RBC/hpf   WBC, UA 0-5 0 - 5 WBC/hpf   Bacteria, UA NONE  SEEN NONE SEEN   Squamous Epithelial / LPF 0-5 (A) NONE SEEN   Mucus PRESENT   Fern Test   Collection Time: 01/10/18  9:11 PM  Result Value Ref Range   POCT Fern Test Positive = ruptured amniotic membanes   CBC   Collection Time: 01/10/18  9:20 PM  Result Value Ref Range   WBC 7.1 4.0 -  10.5 K/uL   RBC 3.45 (L) 3.87 - 5.11 MIL/uL   Hemoglobin 9.8 (L) 12.0 - 15.0 g/dL   HCT 45.4 (L) 09.8 - 11.9 %   MCV 84.6 78.0 - 100.0 fL   MCH 28.4 26.0 - 34.0 pg   MCHC 33.6 30.0 - 36.0 g/dL   RDW 14.7 82.9 - 56.2 %   Platelets 251 150 - 400 K/uL    Patient Active Problem List   Diagnosis Date Noted  . Premature rupture of membranes 01/10/2018  . Flu-like symptoms 09/07/2017  . Supervision of normal pregnancy 06/25/2017  . Depression, postpartum 10/11/2016    Class: Acute  . Marijuana use 03/24/2016  . Depression with anxiety 03/19/2016  . Hx of preeclampsia, prior pregnancy, currently pregnant 03/19/2016  . Smoker 02/06/2016  . Heart murmur 09/21/2014  . Rh negative state in antepartum period 05/01/2014    Assessment: Monica Cox is a 27 y.o. [id number] at [redacted]w[redacted]d here for PROM at term  1. Labor: latent 2. FWB: Cat I 3. Pain: analgesia/anesthesia prn 4. GBS: neg   Plan: 1. Admit to BS 2. Expectant mngt for now, consider Pitocin 3. Anticipate SVD   Donette Larry, CNM  01/10/2018, 10:01 PM

## 2018-01-10 NOTE — MAU Note (Signed)
Pt reports LOF since 1845. Pt reports light contractions and +FM. Denies prenatal problems.

## 2018-01-11 ENCOUNTER — Inpatient Hospital Stay (HOSPITAL_COMMUNITY): Payer: Medicaid Other | Admitting: Anesthesiology

## 2018-01-11 ENCOUNTER — Encounter (HOSPITAL_COMMUNITY): Payer: Self-pay | Admitting: Obstetrics and Gynecology

## 2018-01-11 DIAGNOSIS — Z3A38 38 weeks gestation of pregnancy: Secondary | ICD-10-CM

## 2018-01-11 LAB — RPR: RPR: NONREACTIVE

## 2018-01-11 MED ORDER — SENNOSIDES-DOCUSATE SODIUM 8.6-50 MG PO TABS
2.0000 | ORAL_TABLET | ORAL | Status: DC
  Administered 2018-01-11 – 2018-01-14 (×4): 2 via ORAL
  Filled 2018-01-11 (×4): qty 2

## 2018-01-11 MED ORDER — FENTANYL CITRATE (PF) 100 MCG/2ML IJ SOLN
INTRAMUSCULAR | Status: AC
  Filled 2018-01-11: qty 2

## 2018-01-11 MED ORDER — LACTATED RINGERS IV SOLN: 500.0000 mL | Freq: Once | INTRAVENOUS | Status: DC

## 2018-01-11 MED ORDER — EPHEDRINE 5 MG/ML INJ
10.0000 mg | INTRAVENOUS | Status: DC | PRN
  Filled 2018-01-11: qty 2

## 2018-01-11 MED ORDER — LIDOCAINE HCL (PF) 1 % IJ SOLN
INTRAMUSCULAR | Status: DC | PRN
  Administered 2018-01-11 (×2): 5 mL via EPIDURAL

## 2018-01-11 MED ORDER — FENTANYL CITRATE (PF) 100 MCG/2ML IJ SOLN
100.0000 ug | INTRAMUSCULAR | Status: DC | PRN
  Administered 2018-01-11: 100 ug via INTRAVENOUS

## 2018-01-11 MED ORDER — WITCH HAZEL-GLYCERIN EX PADS: 1.0000 "application " | MEDICATED_PAD | CUTANEOUS | Status: DC | PRN

## 2018-01-11 MED ORDER — OXYTOCIN 40 UNITS IN LACTATED RINGERS INFUSION - SIMPLE MED
1.0000 m[IU]/min | INTRAVENOUS | Status: DC
  Administered 2018-01-11: 2 m[IU]/min via INTRAVENOUS

## 2018-01-11 MED ORDER — DIBUCAINE 1 % RE OINT: 1.0000 "application " | TOPICAL_OINTMENT | RECTAL | Status: DC | PRN

## 2018-01-11 MED ORDER — TERBUTALINE SULFATE 1 MG/ML IJ SOLN
0.2500 mg | Freq: Once | INTRAMUSCULAR | Status: DC | PRN
  Filled 2018-01-11: qty 1

## 2018-01-11 MED ORDER — ONDANSETRON HCL 4 MG PO TABS: 4.0000 mg | ORAL_TABLET | ORAL | Status: DC | PRN

## 2018-01-11 MED ORDER — PHENYLEPHRINE 40 MCG/ML (10ML) SYRINGE FOR IV PUSH (FOR BLOOD PRESSURE SUPPORT)
80.0000 ug | PREFILLED_SYRINGE | INTRAVENOUS | Status: DC | PRN
  Filled 2018-01-11: qty 5

## 2018-01-11 MED ORDER — FENTANYL 2.5 MCG/ML BUPIVACAINE 1/10 % EPIDURAL INFUSION (WH - ANES)
14.0000 mL/h | INTRAMUSCULAR | Status: DC | PRN
  Administered 2018-01-11: 14 mL/h via EPIDURAL
  Filled 2018-01-11: qty 100

## 2018-01-11 MED ORDER — OXYCODONE HCL 5 MG PO TABS
5.0000 mg | ORAL_TABLET | ORAL | Status: DC | PRN
  Administered 2018-01-11 – 2018-01-14 (×8): 5 mg via ORAL
  Filled 2018-01-11 (×8): qty 1

## 2018-01-11 MED ORDER — PNEUMOCOCCAL VAC POLYVALENT 25 MCG/0.5ML IJ INJ
0.5000 mL | INJECTION | INTRAMUSCULAR | Status: AC
  Administered 2018-01-12: 0.5 mL via INTRAMUSCULAR
  Filled 2018-01-11 (×2): qty 0.5

## 2018-01-11 MED ORDER — DIPHENHYDRAMINE HCL 25 MG PO CAPS: 25.0000 mg | ORAL_CAPSULE | Freq: Four times a day (QID) | ORAL | Status: DC | PRN

## 2018-01-11 MED ORDER — IBUPROFEN 600 MG PO TABS
600.0000 mg | ORAL_TABLET | Freq: Four times a day (QID) | ORAL | Status: DC
  Administered 2018-01-11 – 2018-01-15 (×17): 600 mg via ORAL
  Filled 2018-01-11 (×17): qty 1

## 2018-01-11 MED ORDER — COCONUT OIL OIL: 1.0000 "application " | TOPICAL_OIL | Status: DC | PRN

## 2018-01-11 MED ORDER — TETANUS-DIPHTH-ACELL PERTUSSIS 5-2.5-18.5 LF-MCG/0.5 IM SUSP
0.5000 mL | Freq: Once | INTRAMUSCULAR | Status: AC
  Administered 2018-01-12: 0.5 mL via INTRAMUSCULAR
  Filled 2018-01-11: qty 0.5

## 2018-01-11 MED ORDER — BENZOCAINE-MENTHOL 20-0.5 % EX AERO
1.0000 "application " | INHALATION_SPRAY | CUTANEOUS | Status: DC | PRN
  Administered 2018-01-11: 1 via TOPICAL
  Filled 2018-01-11: qty 56

## 2018-01-11 MED ORDER — SIMETHICONE 80 MG PO CHEW: 80.0000 mg | CHEWABLE_TABLET | ORAL | Status: DC | PRN

## 2018-01-11 MED ORDER — ONDANSETRON HCL 4 MG/2ML IJ SOLN: 4.0000 mg | INTRAMUSCULAR | Status: DC | PRN

## 2018-01-11 MED ORDER — DIPHENHYDRAMINE HCL 50 MG/ML IJ SOLN
12.5000 mg | INTRAMUSCULAR | Status: DC | PRN
  Administered 2018-01-11: 12.5 mg via INTRAVENOUS
  Filled 2018-01-11: qty 1

## 2018-01-11 MED ORDER — PRENATAL MULTIVITAMIN CH
1.0000 | ORAL_TABLET | Freq: Every day | ORAL | Status: DC
  Filled 2018-01-11 (×4): qty 1

## 2018-01-11 MED ORDER — ACETAMINOPHEN 325 MG PO TABS
650.0000 mg | ORAL_TABLET | ORAL | Status: DC | PRN
  Administered 2018-01-11 – 2018-01-15 (×4): 650 mg via ORAL
  Filled 2018-01-11 (×4): qty 2

## 2018-01-11 MED ORDER — PHENYLEPHRINE 40 MCG/ML (10ML) SYRINGE FOR IV PUSH (FOR BLOOD PRESSURE SUPPORT)
80.0000 ug | PREFILLED_SYRINGE | INTRAVENOUS | Status: DC | PRN
  Filled 2018-01-11: qty 5
  Filled 2018-01-11: qty 10

## 2018-01-11 NOTE — Anesthesia Preprocedure Evaluation (Signed)
Anesthesia Evaluation  Patient identified by MRN, date of birth, ID band Patient awake    Reviewed: Allergy & Precautions, H&P , NPO status , Patient's Chart, lab work & pertinent test results  Airway Mallampati: II   Neck ROM: full    Dental   Pulmonary asthma , former smoker,    breath sounds clear to auscultation       Cardiovascular hypertension,  Rhythm:regular Rate:Normal     Neuro/Psych PSYCHIATRIC DISORDERS Anxiety Depression    GI/Hepatic   Endo/Other    Renal/GU      Musculoskeletal   Abdominal   Peds  Hematology  (+) anemia ,   Anesthesia Other Findings   Reproductive/Obstetrics (+) Pregnancy                             Anesthesia Physical Anesthesia Plan  ASA: II  Anesthesia Plan: Epidural   Post-op Pain Management:    Induction: Intravenous  PONV Risk Score and Plan: 2 and Treatment may vary due to age or medical condition  Airway Management Planned: Natural Airway  Additional Equipment:   Intra-op Plan:   Post-operative Plan:   Informed Consent: I have reviewed the patients History and Physical, chart, labs and discussed the procedure including the risks, benefits and alternatives for the proposed anesthesia with the patient or authorized representative who has indicated his/her understanding and acceptance.     Plan Discussed with: Anesthesiologist  Anesthesia Plan Comments:         Anesthesia Quick Evaluation

## 2018-01-11 NOTE — Anesthesia Postprocedure Evaluation (Signed)
Anesthesia Post Note  Patient: Caleyah N Pickard-Lee  Procedure(s) Performed: AN AD HOC LABOR EPIDURAL     Patient location during evaluation: Mother Baby Anesthesia Type: Epidural Level of consciousness: awake and alert and oriented Pain management: satisfactory to patient Vital Signs Assessment: post-procedure vital signs reviewed and stable Respiratory status: respiratory function stable Cardiovascular status: stable Postop Assessment: no headache, no backache, epidural receding, patient able to bend at knees, no signs of nausea or vomiting and adequate PO intake Anesthetic complications: no    Last Vitals:  Vitals:   01/11/18 0745 01/11/18 0925  BP: 120/76 127/69  Pulse: 66 (!) 59  Resp: 20   Temp: 36.5 C 36.7 C  SpO2:      Last Pain:  Vitals:   01/11/18 1102  TempSrc:   PainSc: 7    Pain Goal:                 Oluwaseyi Tull

## 2018-01-11 NOTE — Progress Notes (Signed)
Labor Progress Note Dot BeenBriana N Pickard-Lee is a 27 y.o. [id number] at 7099w0d presented for PROM at term  S:  Felt some ctx while walking, none now.  O:  BP 130/73   Pulse 70   Temp 98.3 F (36.8 C) (Oral)   Resp 18   Ht 5' 3.5" (1.613 m)   Wt 203 lb (92.1 kg)   LMP 04/13/2017 (Exact Date)   SpO2 100%   BMI 35.40 kg/m  EFM: baseline 140 bpm/ mod variability/ + accels/ no decels  Toco: irregular  SVE: Dilation: 2 Effacement (%): 70 Presentation: Vertex Exam by:: Amil Bouwman CNM   A/P: 27 y.o. [id number] 5299w0d  1. Labor: latent 2. FWB: Cat I 3. Pain: analgesia/anesthesia prn  Recommend start Pitocin, pt agrees. Anticipate labor progression and SVD.  Donette LarryMelanie Ayuub Penley, CNM 1:10 AM

## 2018-01-11 NOTE — Anesthesia Procedure Notes (Signed)
Epidural Patient location during procedure: OB Start time: 01/11/2018 4:50 AM End time: 01/11/2018 4:57 AM  Staffing Anesthesiologist: Achille Rich, MD Performed: anesthesiologist   Preanesthetic Checklist Completed: patient identified, site marked, pre-op evaluation, timeout performed, IV checked, risks and benefits discussed and monitors and equipment checked  Epidural Patient position: sitting Prep: DuraPrep Patient monitoring: heart rate, cardiac monitor, continuous pulse ox and blood pressure Approach: midline Location: L2-L3 Injection technique: LOR saline  Needle:  Needle type: Tuohy  Needle gauge: 17 G Needle length: 9 cm Needle insertion depth: 6 cm Catheter type: closed end flexible Catheter size: 19 Gauge Catheter at skin depth: 12 cm Test dose: negative and Other  Assessment Events: blood not aspirated, injection not painful, no injection resistance and negative IV test  Additional Notes Informed consent obtained prior to proceeding including risk of failure, 1% risk of PDPH, risk of minor discomfort and bruising.  Discussed rare but serious complications including epidural abscess, permanent nerve injury, epidural hematoma.  Discussed alternatives to epidural analgesia and patient desires to proceed.  Timeout performed pre-procedure verifying patient name, procedure, and platelet count.  Patient tolerated procedure well. Reason for block:procedure for pain

## 2018-01-12 LAB — GC/CHLAMYDIA PROBE AMP
CHLAMYDIA, DNA PROBE: NEGATIVE
Neisseria gonorrhoeae by PCR: NEGATIVE

## 2018-01-12 LAB — RUBELLA SCREEN: Rubella: 1.61 index (ref >0.99)

## 2018-01-12 MED ORDER — ESCITALOPRAM OXALATE 10 MG PO TABS
10.0000 mg | ORAL_TABLET | Freq: Every day | ORAL | Status: DC
  Administered 2018-01-12 – 2018-01-14 (×3): 10 mg via ORAL
  Filled 2018-01-12 (×4): qty 1

## 2018-01-12 MED ORDER — RHO D IMMUNE GLOBULIN 1500 UNIT/2ML IJ SOSY
300.0000 ug | PREFILLED_SYRINGE | Freq: Once | INTRAMUSCULAR | Status: AC
  Administered 2018-01-12: 300 ug via INTRAVENOUS
  Filled 2018-01-12: qty 2

## 2018-01-12 NOTE — Clinical Social Work Maternal (Signed)
CLINICAL SOCIAL WORK MATERNAL/CHILD NOTE  Patient Details  Name: Monica Cox MRN: [id number] Date of Birth: 05/17/1991  Date:  01/12/2018  Clinical Social Worker Initiating Note:  Laurey Arrow Date/Time: Initiated:  01/12/18/1208     Child's Name:  Monica Cox   Biological Parents:  Mother, Father   Need for Interpreter:  None   Reason for Referral:  Behavioral Health Concerns, Current Substance Use/Substance Use During Pregnancy    Address:  [address] [place] [address]    Phone number:  [phone] (home)     Additional phone number:   Household Members/Support Persons (HM/SP):   Household Member/Support Person 1   HM/SP Name Relationship DOB or Age  HM/SP -1 Monica Cox daughter 10/05/2016  HM/SP -2        HM/SP -3        HM/SP -4        HM/SP -5        HM/SP -6        HM/SP -7        HM/SP -8          Natural Supports (not living in the home):  Immediate Family, Parent, Spouse/significant other, Extended Family, Chief Executive Officer Supports: None   Employment:     Type of Work:     Education:  Programmer, systems   Homebound arranged:    Museum/gallery curator Resources:  Medicaid   Other Resources:  [employer], Food Stamps    Cultural/Religious Considerations Which May Impact Care:  None Reported.   Strengths:  Ability to meet basic needs , Home prepared for child , Pediatrician chosen, Psychotropic Medications, Understanding of illness, Compliance with medical plan    Psychotropic Medications:  Lexapro      Pediatrician:    [facility]  Pediatrician List:   [place]      Pediatrician Fax Number:    Risk Factors/Current Problems:  Mental Health Concerns , Substance Use    Cognitive State:  Alert , Able to Concentrate , Insightful , Goal Oriented , Linear Thinking    Mood/Affect:  Bright ,  Interested , Happy , Comfortable , Flat , Relaxed    CSW Assessment: CSW met with [name] to complete an assessment for MH hx, EDPS score, and SA hx.  When CSW arrived, [name] was dressed and was observing infant sleeping. FOB was also present and with [name]'s permission, CSW asked FOB to leave the room in order to meet with [name] in private. [name] was easy to engage, forthcoming and receptive to meeting with CSW.   CSW asked about [name]'s emotional stated and [name] reported feeling good overall.  [name] shared that [name] is currently taking Lexapro and [name] communicated that she feels like the medication is managing [name]'s symptoms.  [name] also shared that [name] has not had her Lexapro since being inpatient (CSW informed bedside and requested medication for [name]). CSW provided education regarding the baby blues period vs. perinatal mood disorders, discussed treatment and gave resources for mental health follow up if concerns arise.  CSW recommends self-evaluation during the postpartum time period using the New Mom Checklist from Postpartum Progress and encouraged [name] to contact a medical professional if symptoms are noted at any time.  [name] acknowledged having PPD with [name]'s older three children and share that [name]'s symtoms did not go away  without medication after [name]'s 3rd child in 2017. [name] had insight and awareness expressed feeling comfortable talking to CSW about [name]'s PPD experience.  [name] also shared feelings of being afraid to talk to her family about PPD. CSW offered to return to [name]'s room when FOB and [name]'s parent arrive to provide education about PPD; [name] was receptive and appreciative. [name] was also receptive to resources for outpatient counseling.  CSW assessed for safety and [name] denied SI, HI, and DV.   CSW also asked about [name]'s SA hx.  [name] admitted to smoking marijuana during [name]'s pregnancy.  [name] communicated that [name] smoked to decrease [name]'s nausea and to increase [name]'s appetite.  Per [name], [name]'s last use was December  2018. CSW explained hospital SA policy and [name] was understanding. CSW made [name] aware that CSW will monitor infant's UDS and CDS and if the results are positive without an explanation, CSW will make a report to [facility] CPS.  [name] denied CPS hx.   CSW provided review of Sudden Infant Death Syndrome (SIDS) precautions.     CSW Plan/Description:  No Further Intervention Required/No Barriers to Discharge, Sudden Infant Death Syndrome (SIDS) Education, Other Information/Referral to [employer], Perinatal Mood and Anxiety Disorder (PMADs) Education, Other Patient/Family Education, [place], CSW Will Continue to Monitor Umbilical Cord Tissue Drug Screen Results and Make Report if Warranted   Laurey Arrow, MSW, LCSW Clinical Social Work [phone]  Dimple Nanas, LCSW 01/12/2018, 12:10 PM

## 2018-01-12 NOTE — Progress Notes (Addendum)
Post Partum Day 1 Subjective: Still having cramping that is causing 5 of 10 pain. Patient is bottle feeding. Still thinking about contraception choices. No other complaints.  Objective: Blood pressure 121/78, pulse (!) 59, temperature 98 F (36.7 C), temperature source Oral, resp. rate 18, height 5' 3.5" (1.613 m), weight 92.1 kg (203 lb), last menstrual period 04/13/2017, SpO2 100 %, unknown if currently breastfeeding.  Physical Exam:  General: alert, cooperative and no distress  Respiratory: Normal work of breathing, CTA BL Lochia: appropriate Uterine Fundus: firm Extremities: No pedal edema noted.  DVT Evaluation: No evidence of DVT seen on physical exam.  Recent Labs    01/10/18 2120  HGB 9.8*  HCT 29.2*    Assessment/Plan: Plan for discharge tomorrow Hx of PP preeclampsia- Follow up outpatient for BP monitoring   LOS: 2 days   Monica SessionsGrant G Cox 01/12/2018, 8:01 AM   OB FELLOW MEDICAL STUDENT NOTE ATTESTATION  I confirm that I have verified the information documented in the medical student's note and that I have also personally performed the physical exam and all medical decision making activities.  Patient doing well PPD#1 from SVD.  Bottle feeding.  Has h/o postpartum PreE in prior pregnancies. BP here wnl. Close outpatient f/u on BP on 4/12 Plan to d/c home tomorrow.   Frederik PearJulie P Calli Bashor, MD OB Fellow 01/12/2018, 8:41 AM

## 2018-01-13 ENCOUNTER — Encounter: Payer: Medicaid Other | Admitting: Advanced Practice Midwife

## 2018-01-13 ENCOUNTER — Encounter (HOSPITAL_COMMUNITY): Payer: Self-pay | Admitting: *Deleted

## 2018-01-13 LAB — RH IG WORKUP (INCLUDES ABO/RH)
ABO/RH(D): O NEG
FETAL SCREEN: NEGATIVE
Gestational Age(Wks): 39
UNIT DIVISION: 0

## 2018-01-13 LAB — PROTEIN / CREATININE RATIO, URINE
CREATININE, URINE: 120 mg/dL
Protein Creatinine Ratio: 0.43 mg/mg{Cre} — ABNORMAL HIGH (ref 0.00–0.15)
Total Protein, Urine: 51 mg/dL

## 2018-01-13 LAB — COMPREHENSIVE METABOLIC PANEL
ALBUMIN: 2.7 g/dL — AB (ref 3.5–5.0)
ALT: 10 U/L — ABNORMAL LOW (ref 14–54)
AST: 18 U/L (ref 15–41)
Alkaline Phosphatase: 170 U/L — ABNORMAL HIGH (ref 38–126)
Anion gap: 8 (ref 5–15)
BUN: 9 mg/dL (ref 6–20)
CHLORIDE: 108 mmol/L (ref 101–111)
CO2: 23 mmol/L (ref 22–32)
Calcium: 8.4 mg/dL — ABNORMAL LOW (ref 8.9–10.3)
Creatinine, Ser: 0.69 mg/dL (ref 0.44–1.00)
GFR calc Af Amer: >60 mL/min (ref >60)
GFR calc non Af Amer: >60 mL/min (ref >60)
GLUCOSE: 81 mg/dL (ref 65–99)
POTASSIUM: 3.6 mmol/L (ref 3.5–5.1)
Sodium: 139 mmol/L (ref 135–145)
Total Bilirubin: 0.5 mg/dL (ref 0.3–1.2)
Total Protein: 6.3 g/dL — ABNORMAL LOW (ref 6.5–8.1)

## 2018-01-13 LAB — CBC
HCT: 27.6 % — ABNORMAL LOW (ref 36.0–46.0)
Hemoglobin: 9.2 g/dL — ABNORMAL LOW (ref 12.0–15.0)
MCH: 28.8 pg (ref 26.0–34.0)
MCHC: 33.3 g/dL (ref 30.0–36.0)
MCV: 86.3 fL (ref 78.0–100.0)
PLATELETS: 232 10*3/uL (ref 150–400)
RBC: 3.2 MIL/uL — ABNORMAL LOW (ref 3.87–5.11)
RDW: 14 % (ref 11.5–15.5)
WBC: 6 10*3/uL (ref 4.0–10.5)

## 2018-01-13 MED ORDER — LABETALOL HCL 5 MG/ML IV SOLN: 20.0000 mg | INTRAVENOUS | Status: DC | PRN

## 2018-01-13 MED ORDER — MAGNESIUM SULFATE BOLUS VIA INFUSION
4.0000 g | Freq: Once | INTRAVENOUS | Status: AC
  Administered 2018-01-13: 4 g via INTRAVENOUS
  Filled 2018-01-13: qty 500

## 2018-01-13 MED ORDER — HYDRALAZINE HCL 20 MG/ML IJ SOLN: 10.0000 mg | Freq: Once | INTRAMUSCULAR | Status: DC | PRN

## 2018-01-13 MED ORDER — AMLODIPINE BESYLATE 5 MG PO TABS
5.0000 mg | ORAL_TABLET | Freq: Every day | ORAL | Status: DC
  Administered 2018-01-13 – 2018-01-15 (×3): 5 mg via ORAL
  Filled 2018-01-13 (×4): qty 1

## 2018-01-13 MED ORDER — LACTATED RINGERS IV SOLN
INTRAVENOUS | Status: DC
Start: 2018-01-13 — End: 2018-01-15
  Administered 2018-01-13 – 2018-01-14 (×3): via INTRAVENOUS

## 2018-01-13 MED ORDER — MAGNESIUM SULFATE BOLUS VIA INFUSION: 4.0000 g | Freq: Once | INTRAVENOUS | Status: DC

## 2018-01-13 MED ORDER — MAGNESIUM SULFATE 40 G IN LACTATED RINGERS - SIMPLE: 2.0000 g/h | INTRAVENOUS | Status: DC

## 2018-01-13 MED ORDER — FERROUS SULFATE 325 (65 FE) MG PO TABS
325.0000 mg | ORAL_TABLET | Freq: Two times a day (BID) | ORAL | Status: DC
  Administered 2018-01-13 – 2018-01-15 (×5): 325 mg via ORAL
  Filled 2018-01-13 (×5): qty 1

## 2018-01-13 MED ORDER — MAGNESIUM SULFATE 40 G IN LACTATED RINGERS - SIMPLE
2.0000 g/h | INTRAVENOUS | Status: AC
  Administered 2018-01-14: 2 g/h via INTRAVENOUS
  Filled 2018-01-13: qty 500
  Filled 2018-01-13: qty 40

## 2018-01-13 NOTE — Progress Notes (Signed)
   01/13/18 0619 01/13/18 0620  Vital Signs  BP (!) 144/77 (pt requested to have her BP taken again) (!) 146/73  BP Location Left Arm Right Arm  Patient Position (if appropriate) Semi-fowlers Semi-fowlers  BP Method Automatic Automatic  Pulse Rate (!) 59 (!) 56   Dr. Doroteo GlassmanPhelps was notified of elevated blood pressures. She stated there are no new orders at the moment, but to notify her of severe blood pressures.

## 2018-01-13 NOTE — Progress Notes (Addendum)
Post Partum Day 1 Subjective: Eating, drinking, voiding, ambulating well.  +flatus.  Lochia and pain wnl.  Denies dizziness, lightheadedness, or sob. Headache this am, and seeing 'crystals', denies ruq/epigastric pain, n/v. Has h/o pp pre-e x 2 w/ readmission.   Objective: Blood pressure (!) 146/73, pulse (!) 56, temperature 98.2 F (36.8 C), temperature source Oral, resp. rate 18, height 5' 3.5" (1.613 m), weight 92.1 kg (203 lb), last menstrual period 04/13/2017, SpO2 100 %, unknown if currently breastfeeding.  Physical Exam:  General: alert, cooperative and no distress Lochia: appropriate Uterine Fundus: firm Incision: n/a DVT Evaluation: No evidence of DVT seen on physical exam. Negative Homan's sign. No cords or calf tenderness. No significant calf/ankle edema. Edema: 1+, DTRs normal, no clonus  Recent Labs    01/10/18 2120  HGB 9.8*  HCT 29.2*    Assessment/Plan: BP elevated x 2, now w/ headache and seeing 'crystals', h/o pp pre-e x 2 w/ readmit. Will get pre-e labs, start norvasc 5mg  daily. If everything returns normal and bp's controlled, will d/c later today w/ f/u at [facility] in 2d. Add fe bid for mild anemia.    LOS: 3 days   Cheral MarkerKimberly R Teren Franckowiak 01/13/2018, 7:16 AM   0840: Discussed w/ Dr. Adrian BlackwaterStinson and Dr. Earlene Plateravis, d/t h/o pp pre-e x 2 requiring re-admit, bp's starting to elevate now w/ headache and visual changes, will start mag 4gm bolus, then 2gm/hr x 24hrs. Pt's nurse notified.  Cheral MarkerKimberly R. Samari Gorby, CNM, [facility]-BC 01/13/2018 8:43 AM

## 2018-01-13 NOTE — Progress Notes (Signed)
Transferred patient to high risk OB per CNM orders for mag. Gave report to charge RN, Lynda RainwaterPamela Lawson. Earl Galasborne, Linda HedgesStefanie LakeviewHudspeth

## 2018-01-14 ENCOUNTER — Encounter (HOSPITAL_COMMUNITY): Payer: Self-pay

## 2018-01-14 LAB — TYPE AND SCREEN
ABO/RH(D): O NEG
ANTIBODY SCREEN: POSITIVE
Unit division: 0
Unit division: 0

## 2018-01-14 LAB — BPAM RBC
BLOOD PRODUCT EXPIRATION DATE: 201905022359
Blood Product Expiration Date: 201905022359
Unit Type and Rh: 9500
Unit Type and Rh: 9500

## 2018-01-14 NOTE — Progress Notes (Signed)
[facility] [place] [facility] ([facility] CPS worker) report for infant's positive CD[facility]. CPS will follow-up with family within 72 hours.   There are no barriers to d/c.   Blaine HamperAngel Boyd-Gilyard, MSW, LCSW Clinical Social Work [phone]

## 2018-01-14 NOTE — Progress Notes (Signed)
CSW met with [name] in room 311 to inform [name] of infant's CDS results.  [name] was tearful and expressed feelings of guilt and being ashamed. CSW provided active listening and offered [name] resources for SA; [name] declined resources.  [name] communicated that FOB and [name]'s mother is aware that infant used marijuana during pregnancy and [name] plans to share infant's results with them today when they visits. Due to [name]'s emotional state, CSW assessed for safety and [name] denied SI and HI.  [name] plans to follow-up with outpatient counseling resources that were provided to [facility] by CSW during the initial assessment.   CSW left a voicemail message for [facility] CPS.  CPS requested a return call to make a CPS report for infant's positive CDS for THC.  There are no barriers to infant's discharge.   Laurey Arrow, MSW, LCSW Clinical Social Work [phone]

## 2018-01-14 NOTE — Progress Notes (Signed)
Post Partum Day 3/post partum pre eclampsia Subjective: no complaints, up ad lib, voiding and tolerating PO No headache or visual changes Objective: Blood pressure 112/67, pulse 75, temperature 97.6 F (36.4 C), temperature source Oral, resp. rate 16, height 5' 3.5" (1.613 m), weight 203 lb (92.1 kg), last menstrual period 04/13/2017, SpO2 100 %, unknown if currently breastfeeding.  Physical Exam:  General: alert, cooperative and no distress Lochia: appropriate Uterine Fundus: firm Incision:  DVT Evaluation: No evidence of DVT seen on physical exam.  DTR 3+ with 1 beat clonus  Recent Labs    01/13/18 0840  HGB 9.2*  HCT 27.6*    Assessment/Plan: Magnesium stops at 1100, re evaluate later today off magnesium and see if appropriate for discharge, she has been readmitted with 2 previous pregnancies for postpartum pre eclampsia   LOS: 4 days   Monica Cox 01/14/2018, 7:18 AM

## 2018-01-15 MED ORDER — AMLODIPINE BESYLATE 5 MG PO TABS: 5.0000 mg | ORAL_TABLET | Freq: Every day | ORAL | 2 refills | Status: DC

## 2018-01-15 NOTE — Progress Notes (Signed)
Patient discharged home with significant other and infant. Discharge teaching, home care, reasons to seek care, prescriptions, PIH, and follow-up appts discussed. No questions from parents at this time.

## 2018-01-15 NOTE — Discharge Summary (Signed)
Physician Discharge Summary  Patient ID: Monica Cox MRN: [id number] DOB/AGE: September 20, 1991 27 y.o.  Admit date: 01/10/2018 Discharge date: 01/15/2018   Discharge Diagnoses:  Active Problems:   Rh negative state in antepartum period   Pre-eclampsia in postpartum period   Hx of preeclampsia, prior pregnancy, currently pregnant   Marijuana use   Supervision of normal pregnancy   Premature rupture of membranes   Hospital Course: Please see HPI dated 01/10/2018 for details. This is a 27 y.o. [id number] now PPD#4 from a spontaneous vaginal delivery. She presented in spontaneous labor. Her antepartum course was complicated by anxiety/depression for which she is on lexapro. Her post partum course was complicated by post partum pre-eclampsia on PPD#2. She received 24 hrs of MgSO4 therapy and was started on norvasc 5 to good effect. By day 2, she was ambulating, passing flatus and pain was well controlled. Today, she is feeling very well and completely asymptomatic. She was discharged home PPD#4 in good condition. Baby is doing well and will be discharged home with patient. Instructions for follow up given. She will have BP check in office in 4-5 days, sent home on norvasc. UDS positive for THC and seen by SW.   Physical exam  Vitals:   01/15/18 0344 01/15/18 0354 01/15/18 0834 01/15/18 0903  BP: (!) 147/90 (!) 145/76 (!) 153/89 122/74  Pulse: 63 (!) 58 (!) 55 65  Resp:   18   Temp:  98.9 F (37.2 C) 98.7 F (37.1 C)   TempSrc:  Oral Oral   SpO2: 98% 100% 100% 97%  Weight:      Height:       Physical Exam:  General: alert, oriented, cooperative Chest: CTAB, normal respiratory effort Heart: RRR  Abdomen: +BS, soft, appropriately tender to palpation  Uterine Fundus: firm, 2 fingers below the umbilicus Lochia: moderate, rubra DVT Evaluation: no evidence of DVT Extremities: no edema, no calf tenderness   Postpartum contraception: Undecided  Disposition: home  Discharged  Condition: good  Discharge Instructions    Call MD for:  difficulty breathing, headache or visual disturbances   Complete by:  As directed    Call MD for:  extreme fatigue   Complete by:  As directed    Call MD for:  hives   Complete by:  As directed    Call MD for:  persistant dizziness or light-headedness   Complete by:  As directed    Call MD for:  persistant nausea and vomiting   Complete by:  As directed    Call MD for:  redness, tenderness, or signs of infection (pain, swelling, redness, odor or green/yellow discharge around incision site)   Complete by:  As directed    Call MD for:  severe uncontrolled pain   Complete by:  As directed    Call MD for:  temperature >100.4   Complete by:  As directed    Diet - low sodium heart healthy   Complete by:  As directed      Allergies as of 01/15/2018   No Known Allergies     Medication List    STOP taking these medications   aspirin 81 MG chewable tablet   promethazine 12.5 MG tablet Commonly known as:  PHENERGAN     TAKE these medications   albuterol 108 (90 Base) MCG/ACT inhaler Commonly known as:  PROVENTIL HFA;VENTOLIN HFA Inhale 1-2 puffs into the lungs every 4 (four) hours as needed for wheezing or shortness of breath.   amLODipine  5 MG tablet Commonly known as:  NORVASC Take 1 tablet (5 mg total) by mouth daily. Start taking on:  01/16/2018   escitalopram 10 MG tablet Commonly known as:  LEXAPRO Take 1 tablet (10 mg total) by mouth daily.   OB COMPLETE PETITE 35-5-1-200 MG Caps TAKE 1 CAPSULE EVERY DAY      Follow-up Information    [facility]. Schedule an appointment as soon as possible for a visit in 4 week(s).   Specialty:  Obstetrics and Gynecology Contact information: [address] [place] [address] [phone]           Signed: Conan Bowens 01/15/2018, 9:33 AM

## 2018-01-15 NOTE — Discharge Instructions (Signed)
Vaginal Delivery, Care After °Refer to this sheet in the next few weeks. These instructions provide you with information about caring for yourself after vaginal delivery. Your health care provider may also give you more specific instructions. Your treatment has been planned according to current medical practices, but problems sometimes occur. Call your health care provider if you have any problems or questions. °What can I expect after the procedure? °After vaginal delivery, it is common to have: °· Some bleeding from your vagina. °· Soreness in your abdomen, your vagina, and the area of skin between your vaginal opening and your anus (perineum). °· Pelvic cramps. °· Fatigue. ° °Follow these instructions at home: °Medicines °· Take over-the-counter and prescription medicines only as told by your health care provider. °· If you were prescribed an antibiotic medicine, take it as told by your health care provider. Do not stop taking the antibiotic until it is finished. °Driving ° °· Do not drive or operate heavy machinery while taking prescription pain medicine. °· Do not drive for 24 hours if you received a sedative. °Lifestyle °· Do not drink alcohol. This is especially important if you are breastfeeding or taking medicine to relieve pain. °· Do not use tobacco products, including cigarettes, chewing tobacco, or e-cigarettes. If you need help quitting, ask your health care provider. °Eating and drinking °· Drink at least 8 eight-ounce glasses of water every day unless you are told not to by your health care provider. If you choose to breastfeed your baby, you may need to drink more water than this. °· Eat high-fiber foods every day. These foods may help prevent or relieve constipation. High-fiber foods include: °? Whole grain cereals and breads. °? Brown rice. °? Beans. °? Fresh fruits and vegetables. °Activity °· Return to your normal activities as told by your health care provider. Ask your health care provider  what activities are safe for you. °· Rest as much as possible. Try to rest or take a nap when your baby is sleeping. °· Do not lift anything that is heavier than your baby or 10 lb (4.5 kg) until your health care provider says that it is safe. °· Talk with your health care provider about when you can engage in sexual activity. This may depend on your: °? Risk of infection. °? Rate of healing. °? Comfort and desire to engage in sexual activity. °Vaginal Care °· If you have an episiotomy or a vaginal tear, check the area every day for signs of infection. Check for: °? More redness, swelling, or pain. °? More fluid or blood. °? Warmth. °? Pus or a bad smell. °· Do not use tampons or douches until your health care provider says this is safe. °· Watch for any blood clots that may pass from your vagina. These may look like clumps of dark red, brown, or black discharge. °General instructions °· Keep your perineum clean and dry as told by your health care provider. °· Wear loose, comfortable clothing. °· Wipe from front to back when you use the toilet. °· Ask your health care provider if you can shower or take a bath. If you had an episiotomy or a perineal tear during labor and delivery, your health care provider may tell you not to take baths for a certain length of time. °· Wear a bra that supports your breasts and fits you well. °· If possible, have someone help you with household activities and help care for your baby for at least a few days after   you leave the hospital. °· Keep all follow-up visits for you and your baby as told by your health care provider. This is important. °Contact a health care provider if: °· You have: °? Vaginal discharge that has a bad smell. °? Difficulty urinating. °? Pain when urinating. °? A sudden increase or decrease in the frequency of your bowel movements. °? More redness, swelling, or pain around your episiotomy or vaginal tear. °? More fluid or blood coming from your episiotomy or  vaginal tear. °? Pus or a bad smell coming from your episiotomy or vaginal tear. °? A fever. °? A rash. °? Little or no interest in activities you used to enjoy. °? Questions about caring for yourself or your baby. °· Your episiotomy or vaginal tear feels warm to the touch. °· Your episiotomy or vaginal tear is separating or does not appear to be healing. °· Your breasts are painful, hard, or turn red. °· You feel unusually sad or worried. °· You feel nauseous or you vomit. °· You pass large blood clots from your vagina. If you pass a blood clot from your vagina, save it to show to your health care provider. Do not flush blood clots down the toilet without having your health care provider look at them. °· You urinate more than usual. °· You are dizzy or light-headed. °· You have not breastfed at all and you have not had a menstrual period for 12 weeks after delivery. °· You have stopped breastfeeding and you have not had a menstrual period for 12 weeks after you stopped breastfeeding. °Get help right away if: °· You have: °? Pain that does not go away or does not get better with medicine. °? Chest pain. °? Difficulty breathing. °? Blurred vision or spots in your vision. °? Thoughts about hurting yourself or your baby. °· You develop pain in your abdomen or in one of your legs. °· You develop a severe headache. °· You faint. °· You bleed from your vagina so much that you fill two sanitary pads in one hour. °This information is not intended to replace advice given to you by your health care provider. Make sure you discuss any questions you have with your health care provider. °Document Released: 09/19/2000 Document Revised: 03/05/2016 Document Reviewed: 10/07/2015 °Elsevier Interactive Patient Education © 2018 [employer] ° °

## 2018-01-21 ENCOUNTER — Ambulatory Visit: Payer: Medicaid Other

## 2018-01-21 VITALS — BP 120/80 | HR 95 | Wt 190.4 lb

## 2018-01-21 DIAGNOSIS — Z0130 Encounter for examination of blood pressure without abnormal findings: Secondary | ICD-10-CM

## 2018-01-21 NOTE — Progress Notes (Signed)
Pt here for blood pressure 120/80. Spoke with Drenda FreezeFran. Advise make appointment 3 weeks for pp visit. Keep taking Amlodipine. Pad CMA

## 2018-02-10 ENCOUNTER — Ambulatory Visit: Payer: Medicaid Other | Admitting: Advanced Practice Midwife

## 2018-02-11 ENCOUNTER — Ambulatory Visit: Payer: Medicaid Other | Admitting: Women's Health

## 2018-02-17 ENCOUNTER — Ambulatory Visit: Payer: Medicaid Other | Admitting: Advanced Practice Midwife

## 2018-02-23 ENCOUNTER — Ambulatory Visit: Payer: Medicaid Other | Admitting: Advanced Practice Midwife

## 2018-03-09 ENCOUNTER — Ambulatory Visit: Payer: Medicaid Other | Admitting: Advanced Practice Midwife

## 2018-03-10 ENCOUNTER — Other Ambulatory Visit: Payer: Self-pay

## 2018-03-10 ENCOUNTER — Encounter: Payer: Self-pay | Admitting: Advanced Practice Midwife

## 2018-03-10 ENCOUNTER — Ambulatory Visit (INDEPENDENT_AMBULATORY_CARE_PROVIDER_SITE_OTHER): Payer: Medicaid Other | Admitting: Advanced Practice Midwife

## 2018-03-10 MED ORDER — DESLORATADINE 5 MG PO TBDP: 5.0000 mg | ORAL_TABLET | Freq: Every day | ORAL | 6 refills | Status: DC

## 2018-03-10 MED ORDER — ESCITALOPRAM OXALATE 10 MG PO TABS: 15.0000 mg | ORAL_TABLET | Freq: Every day | ORAL | 6 refills | Status: DC

## 2018-03-10 NOTE — Progress Notes (Signed)
Monica Cox is a 27 y.o. who presents for a postpartum visit. She is 7 weeks postpartum following a spontaneous vaginal delivery. I have fully reviewed the prenatal and intrapartum course. The delivery was at 39 gestational weeks.  She had PP preE and took norvasc until 3 weeks ago. Anesthesia: local and epidural. Postpartum course has been uneventful. Baby's course has been uneventful. Baby is feeding by bottle. Bleeding: finished bleeding Sunday. Bowel function is normal. Bladder function is normal. Patient is sexually active.last intercourse Sunday. Contraception method is none. Postpartum depression screening: negative. However, she feels as if her lexapro isn't working as well, wants to increase the dosage.  Will increase by 5 mg,  Offered and declined therapy referral   Current Outpatient Medications:  .  albuterol (PROVENTIL HFA;VENTOLIN HFA) 108 (90 Base) MCG/ACT inhaler, Inhale 1-2 puffs into the lungs every 4 (four) hours as needed for wheezing or shortness of breath., Disp: 1 Inhaler, Rfl: 0 .  escitalopram (LEXAPRO) 10 MG tablet, Take 1 tablet (10 mg total) by mouth daily., Disp: 30 tablet, Rfl: 6 .  Prenat-FeCbn-FeAspGl-FA-Omega (OB COMPLETE PETITE) 35-5-1-200 MG CAPS, TAKE 1 CAPSULE EVERY DAY, Disp: 30 capsule, Rfl: 12 .  amLODipine (NORVASC) 5 MG tablet, Take 1 tablet (5 mg total) by mouth daily. (Patient not taking: Reported on 03/10/2018), Disp: 30 tablet, Rfl: 2  Review of Systems   Constitutional: Negative for fever and chills Eyes: Negative for visual disturbances Respiratory: Negative for shortness of breath, dyspnea Cardiovascular: Negative for chest pain or palpitations  Gastrointestinal: Negative for vomiting, diarrhea and constipation Genitourinary: Negative for dysuria and urgency Musculoskeletal: Negative for back pain, joint pain, myalgias  Neurological: Negative for dizziness and headaches    Objective:     Vitals:   03/10/18 1619  BP: 121/82  Pulse: 79    General:  alert, cooperative and no distress   Breasts:  negative  Lungs: Normal respiratory effort  Heart:  regular rate and rhythm  Abdomen: Soft, nontender   Vulva:  normal  Vagina: normal vagina  Cervix:  closed  Corpus: Well involuted     Rectal Exam: no hemorrhoids        Assessment:    normal postpartum exam. PP PreE, resolved Plan:   1. Contraception: Nexplanon 2. Follow up in:  6/12 for Nexplanon, abstinent until then

## 2018-03-17 ENCOUNTER — Ambulatory Visit (INDEPENDENT_AMBULATORY_CARE_PROVIDER_SITE_OTHER): Payer: Medicaid Other | Admitting: Advanced Practice Midwife

## 2018-03-17 ENCOUNTER — Ambulatory Visit: Payer: Medicaid Other | Admitting: Advanced Practice Midwife

## 2018-03-17 ENCOUNTER — Encounter: Payer: Self-pay | Admitting: Advanced Practice Midwife

## 2018-03-17 VITALS — BP 155/100 | HR 82 | Wt 189.0 lb

## 2018-03-17 DIAGNOSIS — Z3202 Encounter for pregnancy test, result negative: Secondary | ICD-10-CM | POA: Diagnosis not present

## 2018-03-17 DIAGNOSIS — Z30017 Encounter for initial prescription of implantable subdermal contraceptive: Secondary | ICD-10-CM | POA: Insufficient documentation

## 2018-03-17 DIAGNOSIS — Z3049 Encounter for surveillance of other contraceptives: Secondary | ICD-10-CM

## 2018-03-17 LAB — POCT URINE PREGNANCY: Preg Test, Ur: NEGATIVE

## 2018-03-17 MED ORDER — ETONOGESTREL 68 MG ~~LOC~~ IMPL
68.0000 mg | DRUG_IMPLANT | Freq: Once | SUBCUTANEOUS | Status: AC
  Administered 2018-03-17: 68 mg via SUBCUTANEOUS

## 2018-03-17 NOTE — Progress Notes (Signed)
  HPI:  Dot BeenBriana N Pickard-Lee is a 27 y.o. year old African American female here for Nexplanon insertion.  She has been abstinate for . 2 weeks , and her pregnancy test today was negative.  Risks/benefits/side effects of Nexplanon have been discussed and her questions have been answered.  Specifically, a failure rate of 10/998 has been reported, with an increased failure rate if pt takes St. John's Wort and/or antiseizure medicaitons.  Despina HiddenBriana N Pickard-Lee is aware of the common side effect of irregular bleeding, which the incidence of decreases over time.   Past Medical History: Past Medical History:  Diagnosis Date  . Anemia   . Asthma    inhaler as needed  . H/O trichomonas 08/08/2014   2015 pregnancy   . History of chlamydia 03/19/2016   2015 pregnancy   . Hypertension    pp htn  . Nausea 02/06/2016  . Reflux   . Smoker 02/06/2016  . Trichomonas infection 08-08-2014  . Vaginal Pap smear, abnormal   . Vertigo 09/21/2014   antivert rx 09/21/2014      Past Surgical History: Past Surgical History:  Procedure Laterality Date  . DILATION AND CURETTAGE OF UTERUS     MAB Dr. Despina HiddenEure performed  . ESOPHAGOGASTRODUODENOSCOPY ENDOSCOPY      Family History: Family History  Problem Relation Age of Onset  . Ulcers Father   . Hypertension Father   . Hypertension Mother   . Ulcers Paternal Grandmother   . Diabetes Paternal Grandmother   . Ulcers Paternal Aunt   . Liver disease Neg Hx   . Colon cancer Neg Hx     Social History: Social History   Tobacco Use  . Smoking status: Former Smoker    Years: 3.00    Types: Cigars  . Smokeless tobacco: Never Used  Substance Use Topics  . Alcohol use: No    Alcohol/week: 0.0 oz    Comment: occasionally; not now  . Drug use: No    Allergies: No Known Allergies    Her left arm, approximatly 4 inches proximal from the elbow, was cleansed with alcohol and anesthetized with 2cc of 2% Lidocaine.  The area was cleansed again and the Nexplanon  was inserted without difficulty.  A pressure bandage was applied.  Pt was instructed to remove pressure bandage in a few hours, and keep insertion site covered with a bandaid for 3 days.  Back up contraception was recommended for 2 weeks.  Follow-up scheduled PRN problems   BP was normal at PP check up last week, but up today. Pt to check BP at home BID for the next few days--it it remains elevated, will restart Norvasc for a few weeks. (baby now almost 9 week) Jacklyn ShellFrances Cresenzo-Dishmon 03/17/2018 3:22 PM

## 2018-03-19 ENCOUNTER — Other Ambulatory Visit: Payer: Self-pay

## 2018-03-19 ENCOUNTER — Encounter (HOSPITAL_COMMUNITY): Payer: Self-pay

## 2018-03-19 ENCOUNTER — Emergency Department (HOSPITAL_COMMUNITY)
Admission: EM | Admit: 2018-03-19 | Discharge: 2018-03-19 | Disposition: A | Discharge status: Home / Self Care | Payer: Medicaid Other | Attending: Emergency Medicine | Admitting: Emergency Medicine

## 2018-03-19 DIAGNOSIS — Z87891 Personal history of nicotine dependence: Secondary | ICD-10-CM | POA: Diagnosis not present

## 2018-03-19 DIAGNOSIS — Z79899 Other long term (current) drug therapy: Secondary | ICD-10-CM | POA: Insufficient documentation

## 2018-03-19 DIAGNOSIS — J45909 Unspecified asthma, uncomplicated: Secondary | ICD-10-CM | POA: Insufficient documentation

## 2018-03-19 DIAGNOSIS — K0889 Other specified disorders of teeth and supporting structures: Secondary | ICD-10-CM | POA: Diagnosis not present

## 2018-03-19 MED ORDER — PENICILLIN V POTASSIUM 250 MG PO TABS
500.0000 mg | ORAL_TABLET | Freq: Once | ORAL | Status: AC
  Administered 2018-03-19: 500 mg via ORAL
  Filled 2018-03-19: qty 2

## 2018-03-19 MED ORDER — BENZOCAINE 10 % MT GEL: 1.0000 "application " | OROMUCOSAL | 0 refills | Status: DC | PRN

## 2018-03-19 MED ORDER — PENICILLIN V POTASSIUM 500 MG PO TABS: 500.0000 mg | ORAL_TABLET | Freq: Four times a day (QID) | ORAL | 0 refills | Status: AC

## 2018-03-19 MED ORDER — LIDOCAINE VISCOUS HCL 2 % MT SOLN
15.0000 mL | Freq: Once | OROMUCOSAL | Status: AC
  Administered 2018-03-19: 15 mL via OROMUCOSAL
  Filled 2018-03-19: qty 15

## 2018-03-19 NOTE — ED Provider Notes (Signed)
[facility] EMERGENCY DEPARTMENT Provider Note   CSN: [id number] Arrival date & time: 03/19/18  2221     History   Chief Complaint Chief Complaint  Patient presents with  . Dental Pain    HPI Monica Cox is a 27 y.o. female.  Monica Cox is a 27 y.o. Female with a history of hypertension, vertigo, asthma and anemia, presents to the emergency department for evaluation of dental pain which started last night.  She reports pain surrounding her right upper second molar, where she noticed a small hole, which may be a cavity.  Patient reports she used to see a dentist in [facility] but they are out of practice, has not contacted anyone else for follow-up.  She reports pain is a constant throbbing that is worse with chewing or palpation.  She denies any abscess or drainage.  No pain or swelling under the tongue, no difficulty swallowing or breathing.  Patient has tried ibuprofen and Tylenol without improvement, no other aggravating or alleviating factors.  She denies fevers or chills, reports some mild nausea due to severity of pain today.  Tolerating liquids without difficulty.     Past Medical History:  Diagnosis Date  . Anemia   . Asthma    inhaler as needed  . H/O trichomonas 08/08/2014   2015 pregnancy   . History of chlamydia 03/19/2016   2015 pregnancy   . Hypertension    pp htn  . Nausea 02/06/2016  . Reflux   . Smoker 02/06/2016  . Trichomonas infection 08-08-2014  . Vaginal Pap smear, abnormal   . Vertigo 09/21/2014   antivert rx 09/21/2014      Patient Active Problem List   Diagnosis Date Noted  . Nexplanon insertion 03/17/2018  . Flu-like symptoms 09/07/2017  . Depression, postpartum 10/11/2016    Class: Acute  . Marijuana use 03/24/2016  . Depression with anxiety 03/19/2016  . Smoker 02/06/2016  . Pre-eclampsia in postpartum period 11/26/2014  . Heart murmur 09/21/2014    Past Surgical History:  Procedure Laterality Date  . DILATION AND  CURETTAGE OF UTERUS     MAB Dr. Despina Hidden performed  . ESOPHAGOGASTRODUODENOSCOPY ENDOSCOPY       OB History    Gravida  5   Para  4   Term  4   Preterm  0   AB  1   Living  4     SAB  1   TAB  0   Ectopic  0   Multiple  0   Live Births  4            Home Medications    Prior to Admission medications   Medication Sig Start Date End Date Taking? Authorizing Provider  albuterol (PROVENTIL HFA;VENTOLIN HFA) 108 (90 Base) MCG/ACT inhaler Inhale 1-2 puffs into the lungs every 4 (four) hours as needed for wheezing or shortness of breath. 07/02/16   Cheral Marker, CNM  amLODipine (NORVASC) 5 MG tablet Take 1 tablet (5 mg total) by mouth daily. Patient not taking: Reported on 03/10/2018 01/16/18   Conan Bowens, MD  desloratadine (CLARINEX REDITAB) 5 MG disintegrating tablet Take 1 tablet (5 mg total) by mouth daily. 03/10/18   Cresenzo-Dishmon, Scarlette Calico, CNM  escitalopram (LEXAPRO) 10 MG tablet Take 1.5 tablets (15 mg total) by mouth daily. 03/10/18   Cresenzo-Dishmon, Scarlette Calico, CNM  Prenat-FeCbn-FeAspGl-FA-Omega (OB COMPLETE PETITE) 35-5-1-200 MG CAPS TAKE 1 CAPSULE EVERY DAY 06/04/17   Adline Potter, NP  Family History Family History  Problem Relation Age of Onset  . Ulcers Father   . Hypertension Father   . Hypertension Mother   . Ulcers Paternal Grandmother   . Diabetes Paternal Grandmother   . Ulcers Paternal Aunt   . Liver disease Neg Hx   . Colon cancer Neg Hx     Social History Social History   Tobacco Use  . Smoking status: Former Smoker    Years: 3.00    Types: Cigars  . Smokeless tobacco: Never Used  Substance Use Topics  . Alcohol use: No    Alcohol/week: 0.0 oz    Comment: occasionally; not now  . Drug use: No     Allergies   Patient has no known allergies.   Review of Systems Review of Systems  Constitutional: Negative for chills and fever.  HENT: Positive for dental problem and facial swelling. Negative for drooling, ear  discharge, ear pain, sore throat, trouble swallowing and voice change.   Eyes: Negative for pain.  Respiratory: Negative for shortness of breath and stridor.   Gastrointestinal: Positive for nausea. Negative for vomiting.  Musculoskeletal: Negative for neck pain and neck stiffness.  Skin: Negative for color change and rash.     Physical Exam Updated Vital Signs BP (!) 156/86 (BP Location: Right Arm)   Pulse 80   Temp 98.3 F (36.8 C) (Oral)   Resp 16   Ht 5\' 3"  (1.6 m)   Wt 85.7 kg (189 lb)   LMP 02/27/2018   SpO2 99%   BMI 33.48 kg/m   Physical Exam  Constitutional: She appears well-developed and well-nourished. No distress.  HENT:  Head: Normocephalic and atraumatic.  Mouth/Throat:    There is some mild facial swelling over the right cheek, no evidence of eye involvement.  Teeth are overall in good intention, focal pain over the second right upper molar, appears to be small cavity, no evidence of drainable abscess, no sublingual swelling or pain, posterior oropharynx is clear,  Eyes: Right eye exhibits no discharge. Left eye exhibits no discharge.  Neck: Normal range of motion. Neck supple.  Pulmonary/Chest: Effort normal and breath sounds normal. No stridor. No respiratory distress. She has no wheezes. She has no rales.  Respirations equal and unlabored, patient able to speak in full sentences, lungs clear to auscultation bilaterally  Lymphadenopathy:    She has no cervical adenopathy.  Neurological: She is alert. Coordination normal.  Skin: Skin is warm and dry. Capillary refill takes less than 2 seconds. She is not diaphoretic.  Psychiatric: She has a normal mood and affect. Her behavior is normal.  Nursing note and vitals reviewed.    ED Treatments / Results  Labs (all labs ordered are listed, but only abnormal results are displayed) Labs Reviewed - No data to display  EKG None  Radiology No results found.  Procedures Procedures (including critical care  time)  Medications Ordered in ED Medications  penicillin v potassium (VEETID) tablet 500 mg (500 mg Oral Given 03/19/18 2337)  lidocaine (XYLOCAINE) 2 % viscous mouth solution 15 mL (15 mLs Mouth/Throat Given 03/19/18 2338)     Initial Impression / Assessment and Plan / ED Course  I have reviewed the triage vital signs and the nursing notes.  Pertinent labs & imaging results that were available during my care of the patient were reviewed by me and considered in my medical decision making (see chart for details).  Patient with toothache.  No gross abscess.  Exam unconcerning for  Ludwig's angina or spread of infection.  Will treat with penicillin and anti-inflammatories medicine.  First dose of antibiotics given here in ED. Urged patient to follow-up with dentist, dental resources provided.  Final Clinical Impressions(s) / ED Diagnoses   Final diagnoses:  Tooth pain    ED Discharge Orders        Ordered    penicillin v potassium (VEETID) 500 MG tablet  4 times daily     03/19/18 2333    benzocaine (ORAJEL) 10 % mucosal gel  As needed     03/19/18 2333       Dartha Lodge, PA-C 03/20/18 0127    Mancel Bale, MD 03/20/18 [phone]

## 2018-03-19 NOTE — Discharge Instructions (Addendum)
Please take antibiotic course as directed. Continue to use ibuprofen, tylenol and topical gel for pain. Please follow up with your dentist for continued management. Return for severe pain, fevers, swelling or pain under the tongue, difficulty swallowing or breathing.

## 2018-03-19 NOTE — ED Triage Notes (Signed)
Pt reports chipped upper right molar with pain that started last night.

## 2018-04-02 ENCOUNTER — Emergency Department (HOSPITAL_COMMUNITY)
Admission: EM | Admit: 2018-04-02 | Discharge: 2018-04-02 | Disposition: A | Discharge status: Home / Self Care | Payer: Medicaid Other | Attending: Emergency Medicine | Admitting: Emergency Medicine

## 2018-04-02 ENCOUNTER — Other Ambulatory Visit: Payer: Self-pay | Admitting: Women's Health

## 2018-04-02 ENCOUNTER — Other Ambulatory Visit: Payer: Self-pay

## 2018-04-02 ENCOUNTER — Encounter (HOSPITAL_COMMUNITY): Payer: Self-pay | Admitting: Emergency Medicine

## 2018-04-02 DIAGNOSIS — R197 Diarrhea, unspecified: Secondary | ICD-10-CM | POA: Diagnosis not present

## 2018-04-02 DIAGNOSIS — R112 Nausea with vomiting, unspecified: Secondary | ICD-10-CM | POA: Diagnosis not present

## 2018-04-02 DIAGNOSIS — Z5321 Procedure and treatment not carried out due to patient leaving prior to being seen by health care provider: Secondary | ICD-10-CM | POA: Diagnosis not present

## 2018-04-02 NOTE — ED Triage Notes (Signed)
Pt reports she started having N/V/D/ last night. V/ X1 and D/ X1 today. Can tolerate PO fluids.

## 2018-04-23 ENCOUNTER — Telehealth: Payer: Self-pay | Admitting: Advanced Practice Midwife

## 2018-04-23 MED ORDER — MEGESTROL ACETATE 40 MG PO TABS: ORAL_TABLET | ORAL | 1 refills | Status: DC

## 2018-04-23 NOTE — Telephone Encounter (Signed)
Patient states Monica Cox has been bleeding since having the nexplanon placed.  Monica Cox is concerned because Monica Cox has low iron and doesn't want it to get lower. Says Drenda FreezeFran mentioned megace to her when Monica Cox inserted it. Please advise.

## 2018-04-23 NOTE — Telephone Encounter (Signed)
Patient called stating she has had the nexplanon insertion and that she has been bleeding since she got it. Pt states that she stopped for a week and now she has began again, pt also states that she needs her lexapro needs to be upped and she needs a refill of her nausea medication. Please contact pt

## 2018-06-01 ENCOUNTER — Telehealth: Payer: Self-pay | Admitting: Advanced Practice Midwife

## 2018-06-01 NOTE — Telephone Encounter (Signed)
Patient called stating that she would like a call back from [place], Pt states that [place] asked her to call when the medication she gave her stopped working. According patient the medication has stopped working, please contact pt

## 2018-06-02 NOTE — Telephone Encounter (Signed)
Called patient back and left message that I am returning her call.  

## 2018-08-09 ENCOUNTER — Encounter (HOSPITAL_COMMUNITY): Payer: Self-pay | Admitting: Emergency Medicine

## 2018-08-09 ENCOUNTER — Emergency Department (HOSPITAL_COMMUNITY)
Admission: EM | Admit: 2018-08-09 | Discharge: 2018-08-09 | Disposition: A | Discharge status: Home / Self Care | Payer: Medicaid Other | Attending: Emergency Medicine | Admitting: Emergency Medicine

## 2018-08-09 ENCOUNTER — Other Ambulatory Visit: Payer: Self-pay

## 2018-08-09 DIAGNOSIS — I10 Essential (primary) hypertension: Secondary | ICD-10-CM | POA: Diagnosis not present

## 2018-08-09 DIAGNOSIS — J45909 Unspecified asthma, uncomplicated: Secondary | ICD-10-CM | POA: Diagnosis not present

## 2018-08-09 DIAGNOSIS — Z79899 Other long term (current) drug therapy: Secondary | ICD-10-CM | POA: Insufficient documentation

## 2018-08-09 DIAGNOSIS — Z87891 Personal history of nicotine dependence: Secondary | ICD-10-CM | POA: Insufficient documentation

## 2018-08-09 DIAGNOSIS — K0889 Other specified disorders of teeth and supporting structures: Secondary | ICD-10-CM | POA: Diagnosis not present

## 2018-08-09 MED ORDER — ACETAMINOPHEN 500 MG PO TABS: 500.0000 mg | ORAL_TABLET | Freq: Four times a day (QID) | ORAL | 0 refills | Status: DC | PRN

## 2018-08-09 MED ORDER — KETOROLAC TROMETHAMINE 30 MG/ML IJ SOLN
30.0000 mg | Freq: Once | INTRAMUSCULAR | Status: AC
  Administered 2018-08-09: 30 mg via INTRAMUSCULAR
  Filled 2018-08-09: qty 1

## 2018-08-09 MED ORDER — IBUPROFEN 600 MG PO TABS: 600.0000 mg | ORAL_TABLET | Freq: Four times a day (QID) | ORAL | 0 refills | Status: DC | PRN

## 2018-08-09 MED ORDER — PENICILLIN V POTASSIUM 250 MG PO TABS
500.0000 mg | ORAL_TABLET | Freq: Once | ORAL | Status: AC
  Administered 2018-08-09: 500 mg via ORAL
  Filled 2018-08-09: qty 2

## 2018-08-09 MED ORDER — PENICILLIN V POTASSIUM 500 MG PO TABS: 500.0000 mg | ORAL_TABLET | Freq: Four times a day (QID) | ORAL | 0 refills | Status: AC

## 2018-08-09 NOTE — ED Provider Notes (Signed)
[facility] EMERGENCY DEPARTMENT Provider Note   CSN: [id number] Arrival date & time: 08/09/18  1423     History   Chief Complaint Chief Complaint  Patient presents with  . Dental Pain    HPI Monica Cox is a 27 y.o. female who presents with a 3-day history of right upper dental pain.  She reports the tooth broke last week.  She began having pain for the past few days, worse today.  She reports a fever at home of 101.  She denies any neck pain.  She has been taking Tylenol at home without relief.  HPI  Past Medical History:  Diagnosis Date  . Anemia   . Asthma    inhaler as needed  . H/O trichomonas 08/08/2014   2015 pregnancy   . History of chlamydia 03/19/2016   2015 pregnancy   . Hypertension    pp htn  . Nausea 02/06/2016  . Reflux   . Smoker 02/06/2016  . Trichomonas infection 08-08-2014  . Vaginal Pap smear, abnormal   . Vertigo 09/21/2014   antivert rx 09/21/2014      Patient Active Problem List   Diagnosis Date Noted  . Nexplanon insertion 03/17/2018  . Flu-like symptoms 09/07/2017  . Depression, postpartum 10/11/2016    Class: Acute  . Marijuana use 03/24/2016  . Depression with anxiety 03/19/2016  . Smoker 02/06/2016  . Pre-eclampsia in postpartum period 11/26/2014  . Heart murmur 09/21/2014    Past Surgical History:  Procedure Laterality Date  . DILATION AND CURETTAGE OF UTERUS     MAB Dr. Despina Hidden performed  . ESOPHAGOGASTRODUODENOSCOPY ENDOSCOPY       OB History    Gravida  5   Para  4   Term  4   Preterm  0   AB  1   Living  4     SAB  1   TAB  0   Ectopic  0   Multiple  0   Live Births  4            Home Medications    Prior to Admission medications   Medication Sig Start Date End Date Taking? Authorizing Provider  acetaminophen (TYLENOL) 500 MG tablet Take 1 tablet (500 mg total) by mouth every 6 (six) hours as needed. 08/09/18   Daneesha Quinteros, Waylan Boga, PA-C  albuterol (PROVENTIL HFA;VENTOLIN HFA) 108 (90 Base)  MCG/ACT inhaler Inhale 1-2 puffs into the lungs every 4 (four) hours as needed for wheezing or shortness of breath. 07/02/16   Cheral Marker, CNM  amLODipine (NORVASC) 5 MG tablet Take 1 tablet (5 mg total) by mouth daily. Patient not taking: Reported on 03/10/2018 01/16/18   Conan Bowens, MD  benzocaine (ORAJEL) 10 % mucosal gel Use as directed 1 application in the mouth or throat as needed for mouth pain. 03/19/18   Dartha Lodge, PA-C  desloratadine (CLARINEX REDITAB) 5 MG disintegrating tablet Take 1 tablet (5 mg total) by mouth daily. 03/10/18   Cresenzo-Dishmon, Scarlette Calico, CNM  escitalopram (LEXAPRO) 10 MG tablet Take 1.5 tablets (15 mg total) by mouth daily. 03/10/18   Cresenzo-Dishmon, Scarlette Calico, CNM  ibuprofen (ADVIL,MOTRIN) 600 MG tablet Take 1 tablet (600 mg total) by mouth every 6 (six) hours as needed. 08/09/18   Anthone Prieur, Waylan Boga, PA-C  megestrol (MEGACE) 40 MG tablet 3x5d, 2x5d, then 1 daily to help control vaginal bleeding. Stop taking when bleeding stops. 04/23/18   Cheral Marker, CNM  penicillin v potassium (VEETID)  500 MG tablet Take 1 tablet (500 mg total) by mouth 4 (four) times daily for 7 days. 08/09/18 08/16/18  Regan Llorente, Waylan Boga, PA-C  Prenat-FeCbn-FeAspGl-FA-Omega (OB COMPLETE PETITE) 35-5-1-200 MG CAPS TAKE 1 CAPSULE EVERY DAY 06/04/17   Cyril Mourning A, NP  promethazine (PHENERGAN) 12.5 MG tablet TAKE 1 TABLET BY MOUTH EVERY 6 HOURS AS NEEDED FOR NAUSEA OR VOMITING. 04/05/18   Cheral Marker, CNM    Family History Family History  Problem Relation Age of Onset  . Ulcers Father   . Hypertension Father   . Hypertension Mother   . Ulcers Paternal Grandmother   . Diabetes Paternal Grandmother   . Ulcers Paternal Aunt   . Liver disease Neg Hx   . Colon cancer Neg Hx     Social History Social History   Tobacco Use  . Smoking status: Former Smoker    Years: 3.00    Types: Cigars  . Smokeless tobacco: Never Used  Substance Use Topics  . Alcohol use: No     Alcohol/week: 0.0 standard drinks    Comment: occasionally; not now  . Drug use: No     Allergies   Patient has no known allergies.   Review of Systems Review of Systems  Constitutional: Positive for fever.  HENT: Positive for dental problem.   Musculoskeletal: Negative for neck pain.     Physical Exam Updated Vital Signs BP (!) 140/93 (BP Location: Right Arm)   Pulse 88   Temp 98.5 F (36.9 C) (Oral)   Resp 18   Ht 5' 3.5" (1.613 m)   Wt 85.7 kg   LMP 08/07/2018   SpO2 100%   BMI 32.95 kg/m   Physical Exam  Constitutional: She appears well-developed and well-nourished. No distress.  HENT:  Head: Normocephalic and atraumatic.  Mouth/Throat: Oropharynx is clear and moist. No trismus in the jaw. No dental abscesses. No oropharyngeal exudate.    Eyes: Pupils are equal, round, and reactive to light. Conjunctivae are normal. Right eye exhibits no discharge. Left eye exhibits no discharge. No scleral icterus.  Neck: Normal range of motion. Neck supple. No thyromegaly present.  Cardiovascular: Normal rate, regular rhythm, normal heart sounds and intact distal pulses. Exam reveals no gallop and no friction rub.  No murmur heard. Pulmonary/Chest: Effort normal and breath sounds normal. No stridor. No respiratory distress. She has no wheezes. She has no rales.  Abdominal: Soft. Bowel sounds are normal. She exhibits no distension. There is no tenderness. There is no rebound and no guarding.  Musculoskeletal: She exhibits no edema.  Lymphadenopathy:    She has no cervical adenopathy.  Neurological: She is alert. Coordination normal.  Skin: Skin is warm and dry. No rash noted. She is not diaphoretic. No pallor.  Psychiatric: She has a normal mood and affect.  Nursing note and vitals reviewed.    ED Treatments / Results  Labs (all labs ordered are listed, but only abnormal results are displayed) Labs Reviewed - No data to display  EKG None  Radiology No results  found.  Procedures Procedures (including critical care time)  Medications Ordered in ED Medications  ketorolac (TORADOL) 30 MG/ML injection 30 mg (has no administration in time range)  penicillin v potassium (VEETID) tablet 500 mg (has no administration in time range)     Initial Impression / Assessment and Plan / ED Course  I have reviewed the triage vital signs and the nursing notes.  Pertinent labs & imaging results that were available during my  care of the patient were reviewed by me and considered in my medical decision making (see chart for details).     Patient with dentalgia.  No abscess requiring immediate incision and drainage.  Exam not concerning for Ludwig's angina or pharyngeal abscess.  Will treat with penicillin, ibuprofen, Tylenol.  Given Toradol injection in the ED.  Pt instructed to follow-up with dentist.  Given resources.  Discussed return precautions.  Patient understands and agrees with plan.  Patient vitals stable throughout ED course and discharged in satisfactory condition.   Final Clinical Impressions(s) / ED Diagnoses   Final diagnoses:  Pain, dental    ED Discharge Orders         Ordered    penicillin v potassium (VEETID) 500 MG tablet  4 times daily     08/09/18 1504    ibuprofen (ADVIL,MOTRIN) 600 MG tablet  Every 6 hours PRN     08/09/18 1504    acetaminophen (TYLENOL) 500 MG tablet  Every 6 hours PRN     08/09/18 1504           Temple Sporer, [place], PA-C 08/09/18 1507    Linwood Dibbles, MD 08/09/18 [phone]

## 2018-08-09 NOTE — ED Triage Notes (Signed)
Patient reports dental pain from a tooth that broke off a week ago.

## 2018-08-09 NOTE — Discharge Instructions (Signed)
Take penicillin until completed.  Alternate ibuprofen and Tylenol as prescribed, as needed for your pain.  Please see dentist as soon as possible, as this is the definitive treatment for your dental pain.  Please return the emergency department if you develop any new or worsening symptoms including masses in your neck, lockjaw, or any other concerning symptoms.

## 2018-08-25 ENCOUNTER — Ambulatory Visit (INDEPENDENT_AMBULATORY_CARE_PROVIDER_SITE_OTHER): Payer: Medicaid Other | Admitting: Advanced Practice Midwife

## 2018-08-25 ENCOUNTER — Encounter: Payer: Self-pay | Admitting: Advanced Practice Midwife

## 2018-08-25 VITALS — BP 138/82 | HR 83 | Ht 63.5 in | Wt 182.0 lb

## 2018-08-25 DIAGNOSIS — N921 Excessive and frequent menstruation with irregular cycle: Secondary | ICD-10-CM | POA: Diagnosis not present

## 2018-08-25 DIAGNOSIS — K920 Hematemesis: Secondary | ICD-10-CM | POA: Diagnosis not present

## 2018-08-25 DIAGNOSIS — Z975 Presence of (intrauterine) contraceptive device: Secondary | ICD-10-CM

## 2018-08-25 MED ORDER — ESCITALOPRAM OXALATE 10 MG PO TABS: 15.0000 mg | ORAL_TABLET | Freq: Every day | ORAL | 6 refills | Status: DC

## 2018-08-25 MED ORDER — MEGESTROL ACETATE 40 MG PO TABS: ORAL_TABLET | ORAL | 6 refills | Status: DC

## 2018-08-25 NOTE — Patient Instructions (Signed)
[facility]  08/26/18  At 10:30

## 2018-08-25 NOTE — Progress Notes (Signed)
[facility] Visit  Patient name: Monica Cox MRN [id number]  Date of birth: [birth year]  CC & HPI:  Monica Cox is a 27 y.o. African American female presenting today for BTB on nexplanon and vomiting blood.  Got nexplanon in June, tried megace inconsistently, it worked while she took it.  Now having dark discharge/blood, annoyed by it.  Considering depo, but discussed how it could have same SE.    Has hx of severe reflux. Not on meds.  Has been having severe reflux w/nausea and now has been vomiting blood for over a week.   Pertinent History Reviewed:  Medical & Surgical Hx:   Past Medical History:  Diagnosis Date  . Anemia   . Asthma    inhaler as needed  . H/O trichomonas 08/08/2014   2015 pregnancy   . History of chlamydia 03/19/2016   2015 pregnancy   . Hypertension    pp htn  . Nausea 02/06/2016  . Reflux   . Smoker 02/06/2016  . Trichomonas infection 08-08-2014  . Vaginal Pap smear, abnormal   . Vertigo 09/21/2014   antivert rx 09/21/2014     Past Surgical History:  Procedure Laterality Date  . DILATION AND CURETTAGE OF UTERUS     [facility] Dr. Despina HiddenEure performed  . ESOPHAGOGASTRODUODENOSCOPY ENDOSCOPY     Family History  Problem Relation Age of Onset  . Ulcers Father   . Hypertension Father   . Hypertension Mother   . Ulcers Paternal Grandmother   . Diabetes Paternal Grandmother   . Ulcers Paternal Aunt   . Liver disease Neg Hx   . Colon cancer Neg Hx     Current Outpatient Medications:  .  albuterol (PROVENTIL HFA;VENTOLIN HFA) 108 (90 Base) MCG/ACT inhaler, Inhale 1-2 puffs into the lungs every 4 (four) hours as needed for wheezing or shortness of breath., Disp: 1 Inhaler, Rfl: 0 .  promethazine (PHENERGAN) 12.5 MG tablet, TAKE 1 TABLET BY MOUTH EVERY 6 HOURS AS NEEDED FOR NAUSEA OR VOMITING., Disp: 30 tablet, Rfl: 0 Social History: Reviewed -  reports that she has quit smoking. Her smoking use included cigars. She quit after 3.00 years of use.  She has never used smokeless tobacco.  Review of Systems:   Constitutional: Negative for fever and chills Eyes: Negative for visual disturbances Respiratory: Negative for shortness of breath, dyspnea Cardiovascular: Negative for chest pain or palpitations  Gastrointestinal: Negative for vomiting, diarrhea and constipation; no abdominal pain Genitourinary: Negative for dysuria and urgency, vaginal irritation or itching Musculoskeletal: Negative for back pain, joint pain, myalgias  Neurological: Negative for dizziness and headaches    Objective Findings:    Physical Examination: Vitals:   08/25/18 1421  BP: 138/82  Pulse: 83   General appearance - well appearing, and in no distress Mental status - alert, oriented to person, place, and time Chest:  Normal respiratory effort Heart - normal rate and regular rhythm Abdomen:  Soft, nontender Pelvic: deferred Musculoskeletal:  Normal range of motion without pain Extremities:  No edema    No results found for this or any previous visit (from the past 24 hour(s)).    Assessment & Plan:  A:   BTB on Nexplanon--will try megace again  Reflux/hematemesis P:  appt w/[facility] tomorrow at 10:30, note given for work for past 2 days    No follow-ups on file.  Jacklyn ShellFrances Cresenzo-Dishmon CNM 08/25/2018 2:33 PM

## 2018-08-26 ENCOUNTER — Ambulatory Visit (INDEPENDENT_AMBULATORY_CARE_PROVIDER_SITE_OTHER): Payer: Medicaid Other | Admitting: Nurse Practitioner

## 2018-08-26 ENCOUNTER — Encounter: Payer: Self-pay | Admitting: Nurse Practitioner

## 2018-08-26 ENCOUNTER — Encounter: Payer: Self-pay | Admitting: *Deleted

## 2018-08-26 ENCOUNTER — Telehealth: Payer: Self-pay | Admitting: *Deleted

## 2018-08-26 ENCOUNTER — Other Ambulatory Visit: Payer: Self-pay | Admitting: *Deleted

## 2018-08-26 DIAGNOSIS — K920 Hematemesis: Secondary | ICD-10-CM

## 2018-08-26 DIAGNOSIS — K219 Gastro-esophageal reflux disease without esophagitis: Secondary | ICD-10-CM | POA: Diagnosis not present

## 2018-08-26 DIAGNOSIS — K5900 Constipation, unspecified: Secondary | ICD-10-CM | POA: Diagnosis not present

## 2018-08-26 MED ORDER — OMEPRAZOLE 20 MG PO CPDR: 20.0000 mg | DELAYED_RELEASE_CAPSULE | Freq: Two times a day (BID) | ORAL | 3 refills | Status: DC

## 2018-08-26 NOTE — Assessment & Plan Note (Signed)
The patient is having progressively worsening GERD symptoms as per above.  This includes vomiting which is now essentially every day with "moderate" hematemesis.  No symptoms of significant anemia.  Given her claims of moderate hematemesis on a regular basis I will check a CBC.  We will start her on a PPI again.  We will plan for upper endoscopy.  She has not had one since 2012 which is essentially normal at that time although she had recently been started on Nexium at that time.  Return for follow-up in 4 months.  Proceed with EGD on propofol/MAC with Dr. Gala Romney in near future: the risks, benefits, and alternatives have been discussed with the patient in detail. The patient states understanding and desires to proceed.  The patient is currently on Lexapro daily and Phenergan as needed for nausea.  No other anticoagulants, anxiolytics, chronic pain medications, or antidepressants.  She is also 27 years old.  Given her young age, chronic medications, and likely high tolerance we will plan for propofol/MAC to promote adequate sedation.

## 2018-08-26 NOTE — Patient Instructions (Signed)
1. I have sent in a prescription for omeprazole 20 mg to your pharmacy.  Take this twice a day, on an empty stomach. 2. I am giving you samples of Linzess 72 mcg.  Take this once a day, on an empty stomach. 3. Call us in 2 weeks and let us know if these medications are helping your symptoms. 4. We will provide you with a work note. 5. We will schedule your upper endoscopy for you. 6. Have your blood tests drawn as soon as you can. 7. Return for follow-up in 4 months. 8. Call us if you have any questions or concerns.  At [facility] we value your feedback. You may receive a survey about your visit today. Please share your experience as we strive to create trusting relationships with our patients to provide genuine, compassionate, quality care.  We appreciate your understanding and patience as we review any laboratory studies, imaging, and other diagnostic tests that are ordered as we care for you. Our office policy is 5 business days for review of these results, and any emergent or urgent results are addressed in a timely manner for your best interest. If you do not hear from our office in 1 week, please contact us.   We also encourage the use of MyChart, which contains your medical information for your review as well. If you are not enrolled in this feature, an access code is on this after visit summary for your convenience. Thank you for allowing us to be involved in your care.  It was great to see you today!  I hope you have a Happy Thanksgiving!!

## 2018-08-26 NOTE — Progress Notes (Signed)
  Referring Provider: Ferguson, John V, MD Primary Care Physician:  Ferguson, John V, MD Primary GI:  Dr. Rourk  Chief Complaint  Patient presents with  . Emesis    blood  . burning in throat    HPI:   Monica Cox is a 26 y.o. female who presents for vomiting with blood.  The patient has not been seen by our office since 02/20/2011 when she was seen for hematemesis and epigastric pain.  At that time it was noted hematemesis was intermittent, had nausea and vomiting throughout pregnancy in 4 months afterward.  Last episode was 3 weeks prior to visit with moderate volume bright red blood and epigastric burning frequently and worse with spicy foods.  No melena or bright red blood per rectum.  Nexium for 3 days seem to help.  Symptoms deemed likely due to resolved reflux.  Recommended EGD for further evaluation, Nexium 40 mg daily.  EGD was completed 02/25/2011 which found normal esophagus and stomach, normal duodenum.  Suspect trivial hematemesis.  Recommended continue Nexium 40 mg daily for the next 3 months and then attempt to taper.  No further GI evaluation.  Today she states she's doing ok overall. She stopped taking Nexium when her insurance stopped paying for it and the only option was OTC (about a couple years ago). Now with recurrent throat burning. Previously hyer burning pain was only in her stomach, now is worse with esophageal burning and throat burning. This has been going on for a few years. Started having intermittent vomiting a year ago but has gotten progressively more frequent. Currently vomiting every day. Sees a "moderate amount" of blood with each emesis episode now. Worse with stress. Has intermittent constipation, bowel movement every 2-3 days, stools are hard, require straining, incomplete emptying. Noted occasional hemorrhoids flares with rectal pain, itching, and irritation. Denies other abdominal pain, fever, chills, hematochezia, melena, fever, chills,  unintentional weight loss. Denies chest pain, dyspnea, dizziness, lightheadedness, syncope, near syncope. Denies any other upper or lower GI symptoms.  She is on Phenergan for nausea which helps.  Past Medical History:  Diagnosis Date  . Anemia   . Asthma    inhaler as needed  . H/O trichomonas 08/08/2014   2015 pregnancy   . History of chlamydia 03/19/2016   2015 pregnancy   . Hypertension    pp htn  . Nausea 02/06/2016  . Reflux   . Smoker 02/06/2016  . Trichomonas infection 08-08-2014  . Vaginal Pap smear, abnormal   . Vertigo 09/21/2014   antivert rx 09/21/2014      Past Surgical History:  Procedure Laterality Date  . DILATION AND CURETTAGE OF UTERUS     [facility] Dr. Eure performed  . ESOPHAGOGASTRODUODENOSCOPY ENDOSCOPY      Current Outpatient Medications  Medication Sig Dispense Refill  . albuterol (PROVENTIL HFA;VENTOLIN HFA) 108 (90 Base) MCG/ACT inhaler Inhale 1-2 puffs into the lungs every 4 (four) hours as needed for wheezing or shortness of breath. 1 Inhaler 0  . escitalopram (LEXAPRO) 10 MG tablet Take 1.5 tablets (15 mg total) by mouth daily. 45 tablet 6  . promethazine (PHENERGAN) 12.5 MG tablet TAKE 1 TABLET BY MOUTH EVERY 6 HOURS AS NEEDED FOR NAUSEA OR VOMITING. 30 tablet 0   No current facility-administered medications for this visit.     Allergies as of 08/26/2018  . (No Known Allergies)    Family History  Problem Relation Age of Onset  . Ulcers Father   . Hypertension Father   .   Hypertension Mother   . Ulcers Paternal Grandmother   . Diabetes Paternal Grandmother   . Ulcers Paternal Aunt   . Liver disease Neg Hx   . Colon cancer Neg Hx   . Gastric cancer Neg Hx   . Esophageal cancer Neg Hx     Social History   Socioeconomic History  . Marital status: Married    Spouse name: Not on file  . Number of children: 1  . Years of education: Not on file  . Highest education level: Not on file  Occupational History  . Occupation: [employer]    Employer:  [employer]    Comment: respiratory therapy  Social Needs  . Financial resource strain: Not on file  . Food insecurity:    Worry: Not on file    Inability: Not on file  . Transportation needs:    Medical: Not on file    Non-medical: Not on file  Tobacco Use  . Smoking status: Former Smoker    Years: 3.00    Types: Cigars  . Smokeless tobacco: Never Used  Substance and Sexual Activity  . Alcohol use: Yes    Alcohol/week: 0.0 standard drinks    Comment: occasionally  . Drug use: No  . Sexual activity: Yes    Birth control/protection: Implant  Lifestyle  . Physical activity:    Days per week: Not on file    Minutes per session: Not on file  . Stress: Not on file  Relationships  . Social connections:    Talks on phone: Not on file    Gets together: Not on file    Attends religious service: Not on file    Active member of club or organization: Not on file    Attends meetings of clubs or organizations: Not on file    Relationship status: Not on file  Other Topics Concern  . Not on file  Social History Narrative  . Not on file    Review of Systems: General: Negative for anorexia, weight loss, fever, chills, fatigue, weakness. ENT: Negative for hoarseness, difficulty swallowing. CV: Negative for chest pain, angina, palpitations, peripheral edema.  Respiratory: Negative for dyspnea at rest, cough, sputum, wheezing.  GI: See history of present illness. MS: Negative for joint pain, low back pain.  Derm: Negative for rash or itching.  Endo: Negative for unusual weight change.  Heme: Negative for bruising or bleeding. Allergy: Negative for rash or hives.   Physical Exam: BP 128/81   Pulse 86   Temp 98.8 F (37.1 C) (Oral)   Ht 5' 3.5" (1.613 m)   Wt 182 lb 6.4 oz (82.7 kg)   LMP 08/07/2018   BMI 31.80 kg/m  General:   Alert and oriented. Pleasant and cooperative. Well-nourished and well-developed.  Eyes:  Without icterus, sclera clear and conjunctiva pink.    Ears:  Normal auditory acuity. Cardiovascular:  S1, S2 present without murmurs appreciated. Extremities without clubbing or edema. Respiratory:  Clear to auscultation bilaterally. No wheezes, rales, or rhonchi. No distress.  Gastrointestinal:  +BS, soft, non-tender and non-distended. No HSM noted. No guarding or rebound. No masses appreciated.  Rectal:  Deferred  Musculoskalatal:  Symmetrical without gross deformities. Neurologic:  Alert and oriented x4;  grossly normal neurologically. Psych:  Alert and cooperative. Normal mood and affect. Heme/Lymph/Immune: No excessive bruising noted.    08/26/2018 11:09 AM   Disclaimer: This note was dictated with voice recognition software. Similar sounding words can inadvertently be transcribed and may not be corrected   upon review.  

## 2018-08-26 NOTE — Telephone Encounter (Signed)
Pre-op scheduled for 09/16/18 at 2:45pm. Letter mailed.  LMTCB

## 2018-08-26 NOTE — Assessment & Plan Note (Signed)
The patient is having progressively worsening reflux symptoms including epigastric pain, esophageal burning, throat burning, nausea, vomiting with hematemesis.  She was last seen in 2012 and started on Nexium which worked well for her.  However, her insurance stopped paying for Nexium and she has not been on a PPI for at least a couple years.  Last EGD about 7 years ago which was essentially normal since starting Nexium.  At this point given her symptoms we will proceed with EGD as per above.  I will start her on omeprazole 20 mg twice daily in an effort to find another PPI that insurance covers that will help her symptoms.  Given her persistent vomiting she is requesting to be written out of work for a few days until she can feel better.  We will provide this.  Follow-up in 4 months.

## 2018-08-26 NOTE — Assessment & Plan Note (Signed)
The patient describes chronic constipation with a bowel movement every 2 to 3 days which are hard stools, straining, incomplete emptying.  She is also having hemorrhoid flares related to constipation.  I will start her on Linzess 72 mcg samples for 2 weeks, request progress report in 2 weeks.  Return for follow-up in 4 months.

## 2018-08-26 NOTE — Telephone Encounter (Signed)
Pt called office, informed of pre-op appt. She wanted to reschedule appt. Gave her phone number to endo scheduler.

## 2018-08-26 NOTE — H&P (View-Only) (Signed)
Referring Provider: Tilda Burrow, MD Primary Care Physician:  Tilda Burrow, MD Primary GI:  Dr. Jena Gauss  Chief Complaint  Patient presents with  . Emesis    blood  . burning in throat    HPI:   Monica Cox is a 27 y.o. female who presents for vomiting with blood.  The patient has not been seen by our office since 02/20/2011 when she was seen for hematemesis and epigastric pain.  At that time it was noted hematemesis was intermittent, had nausea and vomiting throughout pregnancy in 4 months afterward.  Last episode was 3 weeks prior to visit with moderate volume bright red blood and epigastric burning frequently and worse with spicy foods.  No melena or bright red blood per rectum.  Nexium for 3 days seem to help.  Symptoms deemed likely due to resolved reflux.  Recommended EGD for further evaluation, Nexium 40 mg daily.  EGD was completed 02/25/2011 which found normal esophagus and stomach, normal duodenum.  Suspect trivial hematemesis.  Recommended continue Nexium 40 mg daily for the next 3 months and then attempt to taper.  No further GI evaluation.  Today she states she's doing ok overall. She stopped taking Nexium when her insurance stopped paying for it and the only option was OTC (about a couple years ago). Now with recurrent throat burning. Previously hyer burning pain was only in her stomach, now is worse with esophageal burning and throat burning. This has been going on for a few years. Started having intermittent vomiting a year ago but has gotten progressively more frequent. Currently vomiting every day. Sees a "moderate amount" of blood with each emesis episode now. Worse with stress. Has intermittent constipation, bowel movement every 2-3 days, stools are hard, require straining, incomplete emptying. Noted occasional hemorrhoids flares with rectal pain, itching, and irritation. Denies other abdominal pain, fever, chills, hematochezia, melena, fever, chills,  unintentional weight loss. Denies chest pain, dyspnea, dizziness, lightheadedness, syncope, near syncope. Denies any other upper or lower GI symptoms.  She is on Phenergan for nausea which helps.  Past Medical History:  Diagnosis Date  . Anemia   . Asthma    inhaler as needed  . H/O trichomonas 08/08/2014   2015 pregnancy   . History of chlamydia 03/19/2016   2015 pregnancy   . Hypertension    pp htn  . Nausea 02/06/2016  . Reflux   . Smoker 02/06/2016  . Trichomonas infection 08-08-2014  . Vaginal Pap smear, abnormal   . Vertigo 09/21/2014   antivert rx 09/21/2014      Past Surgical History:  Procedure Laterality Date  . DILATION AND CURETTAGE OF UTERUS     [facility] Dr. Despina Hidden performed  . ESOPHAGOGASTRODUODENOSCOPY ENDOSCOPY      Current Outpatient Medications  Medication Sig Dispense Refill  . albuterol (PROVENTIL HFA;VENTOLIN HFA) 108 (90 Base) MCG/ACT inhaler Inhale 1-2 puffs into the lungs every 4 (four) hours as needed for wheezing or shortness of breath. 1 Inhaler 0  . escitalopram (LEXAPRO) 10 MG tablet Take 1.5 tablets (15 mg total) by mouth daily. 45 tablet 6  . promethazine (PHENERGAN) 12.5 MG tablet TAKE 1 TABLET BY MOUTH EVERY 6 HOURS AS NEEDED FOR NAUSEA OR VOMITING. 30 tablet 0   No current facility-administered medications for this visit.     Allergies as of 08/26/2018  . (No Known Allergies)    Family History  Problem Relation Age of Onset  . Ulcers Father   . Hypertension Father   .  Hypertension Mother   . Ulcers Paternal Grandmother   . Diabetes Paternal Grandmother   . Ulcers Paternal Aunt   . Liver disease Neg Hx   . Colon cancer Neg Hx   . Gastric cancer Neg Hx   . Esophageal cancer Neg Hx     Social History   Socioeconomic History  . Marital status: Married    Spouse name: Not on file  . Number of children: 1  . Years of education: Not on file  . Highest education level: Not on file  Occupational History  . Occupation: [employer]    Employer:  [employer]    Comment: respiratory therapy  Social Needs  . Financial resource strain: Not on file  . Food insecurity:    Worry: Not on file    Inability: Not on file  . Transportation needs:    Medical: Not on file    Non-medical: Not on file  Tobacco Use  . Smoking status: Former Smoker    Years: 3.00    Types: Cigars  . Smokeless tobacco: Never Used  Substance and Sexual Activity  . Alcohol use: Yes    Alcohol/week: 0.0 standard drinks    Comment: occasionally  . Drug use: No  . Sexual activity: Yes    Birth control/protection: Implant  Lifestyle  . Physical activity:    Days per week: Not on file    Minutes per session: Not on file  . Stress: Not on file  Relationships  . Social connections:    Talks on phone: Not on file    Gets together: Not on file    Attends religious service: Not on file    Active member of club or organization: Not on file    Attends meetings of clubs or organizations: Not on file    Relationship status: Not on file  Other Topics Concern  . Not on file  Social History Narrative  . Not on file    Review of Systems: General: Negative for anorexia, weight loss, fever, chills, fatigue, weakness. ENT: Negative for hoarseness, difficulty swallowing. CV: Negative for chest pain, angina, palpitations, peripheral edema.  Respiratory: Negative for dyspnea at rest, cough, sputum, wheezing.  GI: See history of present illness. MS: Negative for joint pain, low back pain.  Derm: Negative for rash or itching.  Endo: Negative for unusual weight change.  Heme: Negative for bruising or bleeding. Allergy: Negative for rash or hives.   Physical Exam: BP 128/81   Pulse 86   Temp 98.8 F (37.1 C) (Oral)   Ht 5' 3.5" (1.613 m)   Wt 182 lb 6.4 oz (82.7 kg)   LMP 08/07/2018   BMI 31.80 kg/m  General:   Alert and oriented. Pleasant and cooperative. Well-nourished and well-developed.  Eyes:  Without icterus, sclera clear and conjunctiva pink.    Ears:  Normal auditory acuity. Cardiovascular:  S1, S2 present without murmurs appreciated. Extremities without clubbing or edema. Respiratory:  Clear to auscultation bilaterally. No wheezes, rales, or rhonchi. No distress.  Gastrointestinal:  +BS, soft, non-tender and non-distended. No HSM noted. No guarding or rebound. No masses appreciated.  Rectal:  Deferred  Musculoskalatal:  Symmetrical without gross deformities. Neurologic:  Alert and oriented x4;  grossly normal neurologically. Psych:  Alert and cooperative. Normal mood and affect. Heme/Lymph/Immune: No excessive bruising noted.    08/26/2018 11:09 AM   Disclaimer: This note was dictated with voice recognition software. Similar sounding words can inadvertently be transcribed and may not be corrected  upon review.

## 2018-08-27 NOTE — Progress Notes (Signed)
cc'ed to pcp °

## 2018-09-16 ENCOUNTER — Other Ambulatory Visit (HOSPITAL_COMMUNITY): Payer: Medicaid Other

## 2018-09-16 NOTE — Patient Instructions (Signed)
Monica Cox  09/16/2018     @PREFPERIOPPHARMACY @   Your procedure is scheduled on  09/20/2018 .  Report to [facility] at  715  A.M.  Call this number if you have problems the morning of surgery:  [phone]   Remember:  Follow the diet and prep instructions given to you by Dr Luvenia Starch office.                         Take these medicines the morning of surgery with A SIP OF WATER  Prilosec, phenergan ( if needed). Use your inhaler before you come.    Do not wear jewelry, make-up or nail polish.  Do not wear lotions, powders, or perfumes, or deodorant.  Do not shave 48 hours prior to surgery.  Men may shave face and neck.  Do not bring valuables to the hospital.  [facility] is not responsible for any belongings or valuables.  Contacts, dentures or bridgework may not be worn into surgery.  Leave your suitcase in the car.  After surgery it may be brought to your room.  For patients admitted to the hospital, discharge time will be determined by your treatment team.  Patients discharged the day of surgery will not be allowed to drive home.   Name and phone number of your driver:   family Special instructions:  None  Please read over the following fact sheets that you were given. Anesthesia Post-op Instructions and Care and Recovery After Surgery       Esophagogastroduodenoscopy Esophagogastroduodenoscopy (EGD) is a procedure to examine the lining of the esophagus, stomach, and first part of the small intestine (duodenum). This procedure is done to check for problems such as inflammation, bleeding, ulcers, or growths. During this procedure, a long, flexible, lighted tube with a camera attached (endoscope) is inserted down the throat. Tell a health care provider about:  Any allergies you have.  All medicines you are taking, including vitamins, herbs, eye drops, creams, and over-the-counter medicines.  Any problems you or family members have had  with anesthetic medicines.  Any blood disorders you have.  Any surgeries you have had.  Any medical conditions you have.  Whether you are pregnant or may be pregnant. What are the risks? Generally, this is a safe procedure. However, problems may occur, including:  Infection.  Bleeding.  A tear (perforation) in the esophagus, stomach, or duodenum.  Trouble breathing.  Excessive sweating.  Spasms of the larynx.  A slowed heartbeat.  Low blood pressure.  What happens before the procedure?  Follow instructions from your health care provider about eating or drinking restrictions.  Ask your health care provider about: ? Changing or stopping your regular medicines. This is especially important if you are taking diabetes medicines or blood thinners. ? Taking medicines such as aspirin and ibuprofen. These medicines can thin your blood. Do not take these medicines before your procedure if your health care provider instructs you not to.  Plan to have someone take you home after the procedure.  If you wear dentures, be ready to remove them before the procedure. What happens during the procedure?  To reduce your risk of infection, your health care team will wash or sanitize their hands.  An IV tube will be put in a vein in your hand or arm. You will get medicines and fluids through this tube.  You will be given one or more  of the following: ? A medicine to help you relax (sedative). ? A medicine to numb the area (local anesthetic). This medicine may be sprayed into your throat. It will make you feel more comfortable and keep you from gagging or coughing during the procedure. ? A medicine for pain.  A mouth guard may be placed in your mouth to protect your teeth and to keep you from biting on the endoscope.  You will be asked to lie on your left side.  The endoscope will be lowered down your throat into your esophagus, stomach, and duodenum.  Air will be put into the  endoscope. This will help your health care provider see better.  The lining of your esophagus, stomach, and duodenum will be examined.  Your health care provider may: ? Take a tissue sample so it can be looked at in a lab (biopsy). ? Remove growths. ? Remove objects (foreign bodies) that are stuck. ? Treat any bleeding with medicines or other devices that stop tissue from bleeding. ? Widen (dilate) or stretch narrowed areas of your esophagus and stomach.  The endoscope will be taken out. The procedure may vary among health care providers and hospitals. What happens after the procedure?  Your blood pressure, heart rate, breathing rate, and blood oxygen level will be monitored often until the medicines you were given have worn off.  Do not eat or drink anything until the numbing medicine has worn off and your gag reflex has returned. This information is not intended to replace advice given to you by your health care provider. Make sure you discuss any questions you have with your health care provider. Document Released: 01/23/2005 Document Revised: 02/28/2016 Document Reviewed: 08/16/2015 Elsevier Interactive Patient Education  2018 [employer]. Esophagogastroduodenoscopy, Care After Refer to this sheet in the next few weeks. These instructions provide you with information about caring for yourself after your procedure. Your health care provider may also give you more specific instructions. Your treatment has been planned according to current medical practices, but problems sometimes occur. Call your health care provider if you have any problems or questions after your procedure. What can I expect after the procedure? After the procedure, it is common to have:  A sore throat.  Nausea.  Bloating.  Dizziness.  Fatigue.  Follow these instructions at home:  Do not eat or drink anything until the numbing medicine (local anesthetic) has worn off and your gag reflex has returned. You  will know that the local anesthetic has worn off when you can swallow comfortably.  Do not drive for 24 hours if you received a medicine to help you relax (sedative).  If your health care provider took a tissue sample for testing during the procedure, make sure to get your test results. This is your responsibility. Ask your health care provider or the department performing the test when your results will be ready.  Keep all follow-up visits as told by your health care provider. This is important. Contact a health care provider if:  You cannot stop coughing.  You are not urinating.  You are urinating less than usual. Get help right away if:  You have trouble swallowing.  You cannot eat or drink.  You have throat or chest pain that gets worse.  You are dizzy or light-headed.  You faint.  You have nausea or vomiting.  You have chills.  You have a fever.  You have severe abdominal pain.  You have black, tarry, or bloody stools. This information  is not intended to replace advice given to you by your health care provider. Make sure you discuss any questions you have with your health care provider. Document Released: 09/08/2012 Document Revised: 02/28/2016 Document Reviewed: 08/16/2015 Elsevier Interactive Patient Education  2018 [employer]  Monitored Anesthesia Care Anesthesia is a term that refers to techniques, procedures, and medicines that help a person stay safe and comfortable during a medical procedure. Monitored anesthesia care, or sedation, is one type of anesthesia. Your anesthesia specialist may recommend sedation if you will be having a procedure that does not require you to be unconscious, such as:  Cataract surgery.  A dental procedure.  A biopsy.  A colonoscopy.  During the procedure, you may receive a medicine to help you relax (sedative). There are three levels of sedation:  Mild sedation. At this level, you may feel awake and relaxed. You will be  able to follow directions.  Moderate sedation. At this level, you will be sleepy. You may not remember the procedure.  Deep sedation. At this level, you will be asleep. You will not remember the procedure.  The more medicine you are given, the deeper your level of sedation will be. Depending on how you respond to the procedure, the anesthesia specialist may change your level of sedation or the type of anesthesia to fit your needs. An anesthesia specialist will monitor you closely during the procedure. Let your health care provider know about:  Any allergies you have.  All medicines you are taking, including vitamins, herbs, eye drops, creams, and over-the-counter medicines.  Any use of steroids (by mouth or as a cream).  Any problems you or family members have had with sedatives and anesthetic medicines.  Any blood disorders you have.  Any surgeries you have had.  Any medical conditions you have, such as sleep apnea.  Whether you are pregnant or may be pregnant.  Any use of cigarettes, alcohol, or street drugs. What are the risks? Generally, this is a safe procedure. However, problems may occur, including:  Getting too much medicine (oversedation).  Nausea.  Allergic reaction to medicines.  Trouble breathing. If this happens, a breathing tube may be used to help with breathing. It will be removed when you are awake and breathing on your own.  Heart trouble.  Lung trouble.  Before the procedure Staying hydrated Follow instructions from your health care provider about hydration, which may include:  Up to 2 hours before the procedure - you may continue to drink clear liquids, such as water, clear fruit juice, black coffee, and plain tea.  Eating and drinking restrictions Follow instructions from your health care provider about eating and drinking, which may include:  8 hours before the procedure - stop eating heavy meals or foods such as meat, fried foods, or fatty  foods.  6 hours before the procedure - stop eating light meals or foods, such as toast or cereal.  6 hours before the procedure - stop drinking milk or drinks that contain milk.  2 hours before the procedure - stop drinking clear liquids.  Medicines Ask your health care provider about:  Changing or stopping your regular medicines. This is especially important if you are taking diabetes medicines or blood thinners.  Taking medicines such as aspirin and ibuprofen. These medicines can thin your blood. Do not take these medicines before your procedure if your health care provider instructs you not to.  Tests and exams  You will have a physical exam.  You may have blood  tests done to show: ? How well your kidneys and liver are working. ? How well your blood can clot.  General instructions  Plan to have someone take you home from the hospital or clinic.  If you will be going home right after the procedure, plan to have someone with you for 24 hours.  What happens during the procedure?  Your blood pressure, heart rate, breathing, level of pain and overall condition will be monitored.  An IV tube will be inserted into one of your veins.  Your anesthesia specialist will give you medicines as needed to keep you comfortable during the procedure. This may mean changing the level of sedation.  The procedure will be performed. After the procedure  Your blood pressure, heart rate, breathing rate, and blood oxygen level will be monitored until the medicines you were given have worn off.  Do not drive for 24 hours if you received a sedative.  You may: ? Feel sleepy, clumsy, or nauseous. ? Feel forgetful about what happened after the procedure. ? Have a sore throat if you had a breathing tube during the procedure. ? Vomit. This information is not intended to replace advice given to you by your health care provider. Make sure you discuss any questions you have with your health care  provider. Document Released: 06/18/2005 Document Revised: 02/29/2016 Document Reviewed: 01/13/2016 Elsevier Interactive Patient Education  2018 [employer] Monitored Anesthesia Care, Care After These instructions provide you with information about caring for yourself after your procedure. Your health care provider may also give you more specific instructions. Your treatment has been planned according to current medical practices, but problems sometimes occur. Call your health care provider if you have any problems or questions after your procedure. What can I expect after the procedure? After your procedure, it is common to:  Feel sleepy for several hours.  Feel clumsy and have poor balance for several hours.  Feel forgetful about what happened after the procedure.  Have poor judgment for several hours.  Feel nauseous or vomit.  Have a sore throat if you had a breathing tube during the procedure.  Follow these instructions at home: For at least 24 hours after the procedure:   Do not: ? Participate in activities in which you could fall or become injured. ? Drive. ? Use heavy machinery. ? Drink alcohol. ? Take sleeping pills or medicines that cause drowsiness. ? Make important decisions or sign legal documents. ? Take care of children on your own.  Rest. Eating and drinking  Follow the diet that is recommended by your health care provider.  If you vomit, drink water, juice, or soup when you can drink without vomiting.  Make sure you have little or no nausea before eating solid foods. General instructions  Have a responsible adult stay with you until you are awake and alert.  Take over-the-counter and prescription medicines only as told by your health care provider.  If you smoke, do not smoke without supervision.  Keep all follow-up visits as told by your health care provider. This is important. Contact a health care provider if:  You keep feeling nauseous or you  keep vomiting.  You feel light-headed.  You develop a rash.  You have a fever. Get help right away if:  You have trouble breathing. This information is not intended to replace advice given to you by your health care provider. Make sure you discuss any questions you have with your health care provider. Document Released: 01/13/2016 Document  Revised: 05/14/2016 Document Reviewed: 01/13/2016 Elsevier Interactive Patient Education  [employer].

## 2018-09-20 ENCOUNTER — Encounter (HOSPITAL_COMMUNITY): Payer: Self-pay

## 2018-09-20 ENCOUNTER — Other Ambulatory Visit: Payer: Self-pay

## 2018-09-20 ENCOUNTER — Encounter (HOSPITAL_COMMUNITY)
Admission: RE | Admit: 2018-09-20 | Discharge: 2018-09-20 | Disposition: A | Discharge status: Home / Self Care | Payer: Medicaid Other | Source: Ambulatory Visit | Attending: Internal Medicine | Admitting: Internal Medicine

## 2018-09-20 DIAGNOSIS — Z01818 Encounter for other preprocedural examination: Secondary | ICD-10-CM | POA: Insufficient documentation

## 2018-09-20 HISTORY — DX: Personal history of urinary calculi: Z87.442

## 2018-09-20 HISTORY — DX: Depression, unspecified: F32.A

## 2018-09-20 HISTORY — DX: Gastro-esophageal reflux disease without esophagitis: K21.9

## 2018-09-20 HISTORY — DX: Major depressive disorder, single episode, unspecified: F32.9

## 2018-09-20 LAB — CBC
HEMATOCRIT: 40.8 % (ref 36.0–46.0)
Hemoglobin: 13.4 g/dL (ref 12.0–15.0)
MCH: 29.3 pg (ref 26.0–34.0)
MCHC: 32.8 g/dL (ref 30.0–36.0)
MCV: 89.3 fL (ref 80.0–100.0)
NRBC: 0 % (ref 0.0–0.2)
PLATELETS: 257 10*3/uL (ref 150–400)
RBC: 4.57 MIL/uL (ref 3.87–5.11)
RDW: 12.8 % (ref 11.5–15.5)
WBC: 4.1 10*3/uL (ref 4.0–10.5)

## 2018-09-20 LAB — BASIC METABOLIC PANEL
Anion gap: 7 (ref 5–15)
BUN: 11 mg/dL (ref 6–20)
CO2: 23 mmol/L (ref 22–32)
Calcium: 9.4 mg/dL (ref 8.9–10.3)
Chloride: 111 mmol/L (ref 98–111)
Creatinine, Ser: 0.67 mg/dL (ref 0.44–1.00)
GFR calc Af Amer: >60 mL/min (ref >60)
Glucose, Bld: 85 mg/dL (ref 70–99)
POTASSIUM: 3.3 mmol/L — AB (ref 3.5–5.1)
SODIUM: 141 mmol/L (ref 135–145)

## 2018-09-20 LAB — HCG, SERUM, QUALITATIVE: Preg, Serum: NEGATIVE

## 2018-09-23 ENCOUNTER — Other Ambulatory Visit: Payer: Self-pay

## 2018-09-23 ENCOUNTER — Encounter (HOSPITAL_COMMUNITY)
Admission: RE | Disposition: A | Discharge status: Home / Self Care | Payer: Self-pay | Source: Home / Self Care | Attending: Internal Medicine

## 2018-09-23 ENCOUNTER — Encounter (HOSPITAL_COMMUNITY): Payer: Self-pay | Admitting: *Deleted

## 2018-09-23 ENCOUNTER — Ambulatory Visit (HOSPITAL_COMMUNITY): Payer: Medicaid Other | Admitting: Anesthesiology

## 2018-09-23 ENCOUNTER — Ambulatory Visit (HOSPITAL_COMMUNITY)
Admission: RE | Admit: 2018-09-23 | Discharge: 2018-09-23 | Disposition: A | Discharge status: Home / Self Care | Payer: Medicaid Other | Attending: Internal Medicine | Admitting: Internal Medicine

## 2018-09-23 DIAGNOSIS — K219 Gastro-esophageal reflux disease without esophagitis: Secondary | ICD-10-CM | POA: Diagnosis not present

## 2018-09-23 DIAGNOSIS — Z8249 Family history of ischemic heart disease and other diseases of the circulatory system: Secondary | ICD-10-CM | POA: Diagnosis not present

## 2018-09-23 DIAGNOSIS — I10 Essential (primary) hypertension: Secondary | ICD-10-CM | POA: Diagnosis not present

## 2018-09-23 DIAGNOSIS — K649 Unspecified hemorrhoids: Secondary | ICD-10-CM | POA: Diagnosis not present

## 2018-09-23 DIAGNOSIS — K5900 Constipation, unspecified: Secondary | ICD-10-CM | POA: Diagnosis not present

## 2018-09-23 DIAGNOSIS — R12 Heartburn: Secondary | ICD-10-CM | POA: Diagnosis present

## 2018-09-23 DIAGNOSIS — J45909 Unspecified asthma, uncomplicated: Secondary | ICD-10-CM | POA: Diagnosis not present

## 2018-09-23 DIAGNOSIS — Z79899 Other long term (current) drug therapy: Secondary | ICD-10-CM | POA: Diagnosis not present

## 2018-09-23 DIAGNOSIS — K449 Diaphragmatic hernia without obstruction or gangrene: Secondary | ICD-10-CM | POA: Insufficient documentation

## 2018-09-23 DIAGNOSIS — K920 Hematemesis: Secondary | ICD-10-CM | POA: Diagnosis not present

## 2018-09-23 DIAGNOSIS — Z87891 Personal history of nicotine dependence: Secondary | ICD-10-CM | POA: Diagnosis not present

## 2018-09-23 HISTORY — PX: ESOPHAGOGASTRODUODENOSCOPY (EGD) WITH PROPOFOL: SHX5813

## 2018-09-23 SURGERY — ESOPHAGOGASTRODUODENOSCOPY (EGD) WITH PROPOFOL
Anesthesia: Monitor Anesthesia Care

## 2018-09-23 MED ORDER — PROPOFOL 10 MG/ML IV BOLUS
INTRAVENOUS | Status: AC
  Filled 2018-09-23: qty 60

## 2018-09-23 MED ORDER — LIDOCAINE HCL (CARDIAC) PF 100 MG/5ML IV SOSY
PREFILLED_SYRINGE | INTRAVENOUS | Status: DC | PRN
  Administered 2018-09-23: 40 mg via INTRAVENOUS

## 2018-09-23 MED ORDER — PROPOFOL 10 MG/ML IV BOLUS
INTRAVENOUS | Status: AC
  Filled 2018-09-23: qty 40

## 2018-09-23 MED ORDER — PROPOFOL 10 MG/ML IV BOLUS
INTRAVENOUS | Status: DC | PRN
  Administered 2018-09-23: 30 mg via INTRAVENOUS

## 2018-09-23 MED ORDER — GLYCOPYRROLATE 0.2 MG/ML IJ SOLN
INTRAMUSCULAR | Status: DC | PRN
  Administered 2018-09-23: 0.2 mg via INTRAVENOUS

## 2018-09-23 MED ORDER — PROPOFOL 500 MG/50ML IV EMUL
INTRAVENOUS | Status: DC | PRN
  Administered 2018-09-23: 150 ug/kg/min via INTRAVENOUS

## 2018-09-23 MED ORDER — CHLORHEXIDINE GLUCONATE CLOTH 2 % EX PADS: 6.0000 | MEDICATED_PAD | Freq: Once | CUTANEOUS | Status: DC

## 2018-09-23 MED ORDER — GLYCOPYRROLATE PF 0.2 MG/ML IJ SOSY
PREFILLED_SYRINGE | INTRAMUSCULAR | Status: AC
  Filled 2018-09-23: qty 3

## 2018-09-23 MED ORDER — LACTATED RINGERS IV SOLN
INTRAVENOUS | Status: DC
  Administered 2018-09-23: 08:00:00 via INTRAVENOUS

## 2018-09-23 MED ORDER — STERILE WATER FOR IRRIGATION IR SOLN
Status: DC | PRN
  Administered 2018-09-23: 1.5 mL

## 2018-09-23 MED ORDER — LIDOCAINE 2% (20 MG/ML) 5 ML SYRINGE
INTRAMUSCULAR | Status: AC
  Filled 2018-09-23: qty 15

## 2018-09-23 NOTE — Interval H&P Note (Signed)
History and Physical Interval Note:  09/23/2018 8:36 AM  Monica Cox  has presented today for surgery, with the diagnosis of GERD, hematemesis  The various methods of treatment have been discussed with the patient and family. After consideration of risks, benefits and other options for treatment, the patient has consented to  Procedure(s) with comments: ESOPHAGOGASTRODUODENOSCOPY (EGD) WITH PROPOFOL (N/A) - 8:45am as a surgical intervention .  The patient's history has been reviewed, patient examined, no change in status, stable for surgery.  I have reviewed the patient's chart and labs.  Questions were answered to the patient's satisfaction.     Wellington Winegarden  No dysphagia per patient Prilosec 20 mg twice daily controlling reflux symptoms well.  Plan for diagnostic EGD per plan. The risks, benefits, limitations, alternatives and imponderables have been reviewed with the patient. Potential for esophageal dilation, biopsy, etc. have also been reviewed.  Questions have been answered. All parties agreeable.

## 2018-09-23 NOTE — Progress Notes (Signed)
Please excuse Zettie CooleyLemar Lee from work. He will be providing care to his wife Luanna ColeBriana who cannot drive, operate machinery, or sign legal documents for 24 hours.

## 2018-09-23 NOTE — Anesthesia Postprocedure Evaluation (Signed)
Anesthesia Post Note  Patient: Monica Cox  Procedure(s) Performed: ESOPHAGOGASTRODUODENOSCOPY (EGD) WITH PROPOFOL (N/A )  Patient location during evaluation: PACU Anesthesia Type: MAC Level of consciousness: awake and alert and oriented Pain management: pain level controlled Vital Signs Assessment: post-procedure vital signs reviewed and stable Cardiovascular status: stable Postop Assessment: no apparent nausea or vomiting Anesthetic complications: no     Last Vitals:  Vitals:   09/23/18 0738  BP: 119/79  Pulse: 76  Resp: 20  Temp: 37 C  SpO2: 99%    Last Pain:  Vitals:   09/23/18 0840  TempSrc:   PainSc: 0-No pain                 Jheri Mitter A

## 2018-09-23 NOTE — Transfer of Care (Signed)
Immediate Anesthesia Transfer of Care Note  Patient: Monica Cox  Procedure(s) Performed: ESOPHAGOGASTRODUODENOSCOPY (EGD) WITH PROPOFOL (N/A )  Patient Location: PACU  Anesthesia Type:MAC  Level of Consciousness: awake, alert , oriented and patient cooperative  Airway & Oxygen Therapy: Patient Spontanous Breathing  Post-op Assessment: Report given to RN and Post -op Vital signs reviewed and stable  Post vital signs: Reviewed and stable  Last Vitals:  Vitals Value Taken Time  BP    Temp    Pulse    Resp 9 09/23/2018  9:03 AM  SpO2    Vitals shown include unvalidated device data.  Last Pain:  Vitals:   09/23/18 0840  TempSrc:   PainSc: 0-No pain         Complications: No apparent anesthesia complications

## 2018-09-23 NOTE — Anesthesia Procedure Notes (Signed)
Procedure Name: MAC Date/Time: 09/23/2018 8:39 AM Performed by: Andree Elk Stepfon Rawles A, CRNA Pre-anesthesia Checklist: Patient identified, Emergency Drugs available, Suction available, Patient being monitored and Timeout performed Oxygen Delivery Method: Simple face mask

## 2018-09-23 NOTE — Discharge Instructions (Signed)
EGD Discharge instructions Please read the instructions outlined below and refer to this sheet in the next few weeks. These discharge instructions provide you with general information on caring for yourself after you leave the hospital. Your doctor may also give you specific instructions. While your treatment has been planned according to the most current medical practices available, unavoidable complications occasionally occur. If you have any problems or questions after discharge, please call your doctor. ACTIVITY  You may resume your regular activity but move at a slower pace for the next 24 hours.   Take frequent rest periods for the next 24 hours.   Walking will help expel (get rid of) the air and reduce the bloated feeling in your abdomen.   No driving for 24 hours (because of the anesthesia (medicine) used during the test).   You may shower.   Do not sign any important legal documents or operate any machinery for 24 hours (because of the anesthesia used during the test).  NUTRITION  Drink plenty of fluids.   You may resume your normal diet.   Begin with a light meal and progress to your normal diet.   Avoid alcoholic beverages for 24 hours or as instructed by your caregiver.  MEDICATIONS  You may resume your normal medications unless your caregiver tells you otherwise.  WHAT YOU CAN EXPECT TODAY  You may experience abdominal discomfort such as a feeling of fullness or gas pains.  FOLLOW-UP  Your doctor will discuss the results of your test with you.  SEEK IMMEDIATE MEDICAL ATTENTION IF ANY OF THE FOLLOWING OCCUR:  Excessive nausea (feeling sick to your stomach) and/or vomiting.   Severe abdominal pain and distention (swelling).   Trouble swallowing.   Temperature over 101 F (37.8 C).   Rectal bleeding or vomiting of blood.   GERD information provided  Stop Prilosec 20 mg twice daily  Begin omeprazole 40 mg once daily in the morning before  breakfast  Office visit with us in 3 months     Gastroesophageal Reflux Disease, Adult Gastroesophageal reflux (GER) happens when acid from the stomach flows up into the tube that connects the mouth and the stomach (esophagus). Normally, food travels down the esophagus and stays in the stomach to be digested. With GER, food and stomach acid sometimes move back up into the esophagus. You may have a disease called gastroesophageal reflux disease (GERD) if the reflux:  Happens often.  Causes frequent or very bad symptoms.  Causes problems such as damage to the esophagus. When this happens, the esophagus becomes sore and swollen (inflamed). Over time, GERD can make small holes (ulcers) in the lining of the esophagus. What are the causes? This condition is caused by a problem with the muscle between the esophagus and the stomach. When this muscle is weak or not normal, it does not close properly to keep food and acid from coming back up from the stomach. The muscle can be weak because of:  Tobacco use.  Pregnancy.  Having a certain type of hernia (hiatal hernia).  Alcohol use.  Certain foods and drinks, such as coffee, chocolate, onions, and peppermint. What increases the risk? You are more likely to develop this condition if you:  Are overweight.  Have a disease that affects your connective tissue.  Use NSAID medicines. What are the signs or symptoms? Symptoms of this condition include:  Heartburn.  Difficult or painful swallowing.  The feeling of having a lump in the throat.  A bitter taste in  the mouth.  Bad breath.  Having a lot of saliva.  Having an upset or bloated stomach.  Belching.  Chest pain. Different conditions can cause chest pain. Make sure you see your doctor if you have chest pain.  Shortness of breath or noisy breathing (wheezing).  Ongoing (chronic) cough or a cough at night.  Wearing away of the surface of teeth (tooth enamel).  Weight  loss. How is this treated? Treatment will depend on how bad your symptoms are. Your doctor may suggest:  Changes to your diet.  Medicine.  Surgery. Follow these instructions at home: Eating and drinking   Follow a diet as told by your doctor. You may need to avoid foods and drinks such as: ? Coffee and tea (with or without caffeine). ? Drinks that contain alcohol. ? Energy drinks and sports drinks. ? Bubbly (carbonated) drinks or sodas. ? Chocolate and cocoa. ? Peppermint and mint flavorings. ? Garlic and onions. ? Horseradish. ? Spicy and acidic foods. These include peppers, chili powder, curry powder, vinegar, hot sauces, and BBQ sauce. ? Citrus fruit juices and citrus fruits, such as oranges, lemons, and limes. ? Tomato-based foods. These include red sauce, chili, salsa, and pizza with red sauce. ? Fried and fatty foods. These include donuts, french fries, potato chips, and high-fat dressings. ? High-fat meats. These include hot dogs, rib eye steak, sausage, ham, and bacon. ? High-fat dairy items, such as whole milk, butter, and cream cheese.  Eat small meals often. Avoid eating large meals.  Avoid drinking large amounts of liquid with your meals.  Avoid eating meals during the 2-3 hours before bedtime.  Avoid lying down right after you eat.  Do not exercise right after you eat. Lifestyle   Do not use any products that contain nicotine or tobacco. These include cigarettes, e-cigarettes, and chewing tobacco. If you need help quitting, ask your doctor.  Try to lower your stress. If you need help doing this, ask your doctor.  If you are overweight, lose an amount of weight that is healthy for you. Ask your doctor about a safe weight loss goal. General instructions  Pay attention to any changes in your symptoms.  Take over-the-counter and prescription medicines only as told by your doctor. Do not take aspirin, ibuprofen, or other NSAIDs unless your doctor says it is  okay.  Wear loose clothes. Do not wear anything tight around your waist.  Raise (elevate) the head of your bed about 6 inches (15 cm).  Avoid bending over if this makes your symptoms worse.  Keep all follow-up visits as told by your doctor. This is important. Contact a doctor if:  You have new symptoms.  You lose weight and you do not know why.  You have trouble swallowing or it hurts to swallow.  You have wheezing or a cough that keeps happening.  Your symptoms do not get better with treatment.  You have a hoarse voice. Get help right away if:  You have pain in your arms, neck, jaw, teeth, or back.  You feel sweaty, dizzy, or light-headed.  You have chest pain or shortness of breath.  You throw up (vomit) and your throw-up looks like blood or coffee grounds.  You pass out (faint).  Your poop (stool) is bloody or black.  You cannot swallow, drink, or eat. Summary  If a person has gastroesophageal reflux disease (GERD), food and stomach acid move back up into the esophagus and cause symptoms or problems such as damage to  the esophagus.  Treatment will depend on how bad your symptoms are.  Follow a diet as told by your doctor.  Take all medicines only as told by your doctor. This information is not intended to replace advice given to you by your health care provider. Make sure you discuss any questions you have with your health care provider. Document Released: 03/10/2008 Document Revised: 03/31/2018 Document Reviewed: 03/31/2018 Elsevier Interactive Patient Education  2019 [employer]     Monitored Anesthesia Care, Care After These instructions provide you with information about caring for yourself after your procedure. Your health care provider may also give you more specific instructions. Your treatment has been planned according to current medical practices, but problems sometimes occur. Call your health care provider if you have any problems or questions  after your procedure. What can I expect after the procedure? After your procedure, you may:  Feel sleepy for several hours.  Feel clumsy and have poor balance for several hours.  Feel forgetful about what happened after the procedure.  Have poor judgment for several hours.  Feel nauseous or vomit.  Have a sore throat if you had a breathing tube during the procedure. Follow these instructions at home: For at least 24 hours after the procedure:      Have a responsible adult stay with you. It is important to have someone help care for you until you are awake and alert.  Rest as needed.  Do not: ? Participate in activities in which you could fall or become injured. ? Drive. ? Use heavy machinery. ? Drink alcohol. ? Take sleeping pills or medicines that cause drowsiness. ? Make important decisions or sign legal documents. ? Take care of children on your own. Eating and drinking  Follow the diet that is recommended by your health care provider.  If you vomit, drink water, juice, or soup when you can drink without vomiting.  Make sure you have little or no nausea before eating solid foods. General instructions  Take over-the-counter and prescription medicines only as told by your health care provider.  If you have sleep apnea, surgery and certain medicines can increase your risk for breathing problems. Follow instructions from your health care provider about wearing your sleep device: ? Anytime you are sleeping, including during daytime naps. ? While taking prescription pain medicines, sleeping medicines, or medicines that make you drowsy.  If you smoke, do not smoke without supervision.  Keep all follow-up visits as told by your health care provider. This is important. Contact a health care provider if:  You keep feeling nauseous or you keep vomiting.  You feel light-headed.  You develop a rash.  You have a fever. Get help right away if:  You have trouble  breathing. Summary  For several hours after your procedure, you may feel sleepy and have poor judgment.  Have a responsible adult stay with you for at least 24 hours or until you are awake and alert. This information is not intended to replace advice given to you by your health care provider. Make sure you discuss any questions you have with your health care provider. Document Released: 01/13/2016 Document Revised: 05/08/2017 Document Reviewed: 01/13/2016 Elsevier Interactive Patient Education  2019 [employer].

## 2018-09-23 NOTE — Op Note (Signed)
[facility] Patient Name: Monica PilgrimBriana Cox Procedure Date: 09/23/2018 8:40 AM MRN: [id number] Date of Birth: [birth year] Attending MD: Gennette Pacobert Michael Aslyn Cottman , MD CSN: [id number] Age: 27 Admit Type: Outpatient Procedure:                Upper GI endoscopy Indications:              Heartburn Providers:                Gennette Pacobert Michael Elizabella Nolet, MD, Nena PolioLisa Moore, RN, Sterling Bigiffani                            Roberts, RN, Edythe ClarityKelly Cox, Technician Referring MD:              Medicines:                Propofol per Anesthesia Complications:            No immediate complications. Estimated Blood Loss:     Estimated blood loss: none. Procedure:                Pre-Anesthesia Assessment:                           - Prior to the procedure, a History and Physical                            was performed, and patient medications and                            allergies were reviewed. The patient's tolerance of                            previous anesthesia was also reviewed. The risks                            and benefits of the procedure and the sedation                            options and risks were discussed with the patient.                            All questions were answered, and informed consent                            was obtained. Prior Anticoagulants: The patient has                            taken no previous anticoagulant or antiplatelet                            agents. ASA Grade Assessment: II - A patient with                            mild systemic disease. After reviewing the risks  and benefits, the patient was deemed in                            satisfactory condition to undergo the procedure.                           After obtaining informed consent, the endoscope was                            passed under direct vision. Throughout the                            procedure, the patient's blood pressure, pulse, and                            oxygen  saturations were monitored continuously. The                            [id number]-H190 ([id number]) scope was introduced through the                            and advanced to the second part of duodenum. The                            upper GI endoscopy was accomplished without                            difficulty. The patient tolerated the procedure                            well. Scope In: 8:49:30 AM Scope Out: 8:52:36 AM Total Procedure Duration: 0 hours 3 minutes 6 seconds  Findings:      The examined esophagus was normal. Patulous EG junction.      A small hiatal hernia was present.      The exam was otherwise without abnormality.      The duodenal bulb and second portion of the duodenum were normal. Impression:               - Normal esophagus.                           - Small hiatal hernia.                           - The examination was otherwise normal.                           - Normal duodenal bulb and second portion of the                            duodenum. Moderate Sedation:      Moderate (conscious) sedation was personally administered by an       anesthesia professional. The following parameters were monitored: oxygen       saturation, heart rate, blood pressure, respiratory rate, EKG, adequacy  of pulmonary ventilation, and response to care. Recommendation:           Stop Prilosec 20 mg twice daily; begin omeprazole                            40 mg once in the morning. Office visit with us in                            3 months                           - No repeat upper endoscopy.                           - Return to GI office in 3 weeks. Procedure Code(s):        --- Professional ---                           [id number], Esophagogastroduodenoscopy, flexible,                            transoral; with biopsy, single or multiple Diagnosis Code(s):        --- Professional ---                           K44.9, Diaphragmatic hernia without obstruction or                             gangrene                           R12, Heartburn CPT copyright 2018 American Medical Association. All rights reserved. The codes documented in this report are preliminary and upon coder review may  be revised to meet current compliance requirements. Gerrit Friendsobert M. Celese Banner, MD Gennette Pacobert Michael Braxdon Gappa, MD 09/23/2018 9:02:32 AM This report has been signed electronically. Number of Addenda: 0

## 2018-09-23 NOTE — Interval H&P Note (Signed)
History and Physical Interval Note:  09/23/2018 8:22 AM  Monica Cox  has presented today for surgery, with the diagnosis of GERD, hematemesis  The various methods of treatment have been discussed with the patient and family. After consideration of risks, benefits and other options for treatment, the patient has consented to  Procedure(s) with comments: ESOPHAGOGASTRODUODENOSCOPY (EGD) WITH PROPOFOL (N/A) - 8:45am as a surgical intervention .  The patient's history has been reviewed, patient examined, no change in status, stable for surgery.  I have reviewed the patient's chart and labs.  Questions were answered to the patient's satisfaction.     Monica Cox  No change; dx EGD per plan.  The risks, benefits, limitations, alternatives and imponderables have been reviewed with the patient. Potential for esophageal dilation, biopsy, etc. have also been reviewed.  Questions have been answered. All parties agreeable.

## 2018-09-23 NOTE — Anesthesia Preprocedure Evaluation (Signed)
Anesthesia Evaluation  Patient identified by MRN, date of birth, ID band Patient awake    Reviewed: Allergy & Precautions, H&P , NPO status , Patient's Chart, lab work & pertinent test results  Airway Mallampati: II  TM Distance: >3 FB Neck ROM: full    Dental no notable dental hx.    Pulmonary asthma , former smoker,    Pulmonary exam normal breath sounds clear to auscultation       Cardiovascular Exercise Tolerance: Good hypertension, + Valvular Problems/Murmurs  Rhythm:regular Rate:Normal     Neuro/Psych PSYCHIATRIC DISORDERS Anxiety Depression negative neurological ROS     GI/Hepatic Neg liver ROS, GERD  ,  Endo/Other  negative endocrine ROS  Renal/GU negative Renal ROS  negative genitourinary   Musculoskeletal   Abdominal   Peds  Hematology  (+) Blood dyscrasia, anemia ,   Anesthesia Other Findings   Reproductive/Obstetrics negative OB ROS                             Anesthesia Physical Anesthesia Plan  ASA: II  Anesthesia Plan: MAC   Post-op Pain Management:    Induction:   PONV Risk Score and Plan:   Airway Management Planned:   Additional Equipment:   Intra-op Plan:   Post-operative Plan:   Informed Consent: I have reviewed the patients History and Physical, chart, labs and discussed the procedure including the risks, benefits and alternatives for the proposed anesthesia with the patient or authorized representative who has indicated his/her understanding and acceptance.   Dental Advisory Given  Plan Discussed with: CRNA  Anesthesia Plan Comments:         Anesthesia Quick Evaluation

## 2018-09-27 ENCOUNTER — Other Ambulatory Visit: Payer: Self-pay | Admitting: Gastroenterology

## 2018-09-27 ENCOUNTER — Other Ambulatory Visit: Payer: Self-pay

## 2018-09-27 DIAGNOSIS — E876 Hypokalemia: Secondary | ICD-10-CM

## 2018-09-27 NOTE — Progress Notes (Signed)
Very mildly low potassium. Likely resolved. Can recheck now.

## 2018-10-01 ENCOUNTER — Encounter (HOSPITAL_COMMUNITY): Payer: Self-pay | Admitting: Internal Medicine

## 2018-10-02 ENCOUNTER — Other Ambulatory Visit: Payer: Self-pay | Admitting: Women's Health

## 2018-10-04 ENCOUNTER — Telehealth: Payer: Self-pay | Admitting: Advanced Practice Midwife

## 2018-10-04 NOTE — Telephone Encounter (Signed)
Pt called in regards to medication for nausea,"premithidine" she thinks, she states it was supppose to be sent over to [facility] about a month ago but nothing was sent over.

## 2018-10-04 NOTE — Telephone Encounter (Signed)
Patient requesting refill on promethazine. States it was going to be sent at last visit but it was not. Please advise.

## 2018-10-05 ENCOUNTER — Other Ambulatory Visit: Payer: Self-pay | Admitting: Advanced Practice Midwife

## 2018-10-05 MED ORDER — PROMETHAZINE HCL 25 MG PO TABS: 25.0000 mg | ORAL_TABLET | Freq: Four times a day (QID) | ORAL | 1 refills | Status: DC | PRN

## 2018-12-29 ENCOUNTER — Ambulatory Visit: Payer: Medicaid Other | Admitting: Nurse Practitioner

## 2019-02-20 ENCOUNTER — Encounter (HOSPITAL_COMMUNITY): Payer: Self-pay | Admitting: Emergency Medicine

## 2019-02-20 ENCOUNTER — Emergency Department (HOSPITAL_COMMUNITY)
Admission: EM | Admit: 2019-02-20 | Discharge: 2019-02-20 | Disposition: A | Discharge status: Home / Self Care | Payer: Managed Care, Other (non HMO) | Attending: Emergency Medicine | Admitting: Emergency Medicine

## 2019-02-20 ENCOUNTER — Emergency Department (HOSPITAL_COMMUNITY): Payer: Managed Care, Other (non HMO)

## 2019-02-20 ENCOUNTER — Other Ambulatory Visit: Payer: Self-pay

## 2019-02-20 DIAGNOSIS — S3992XA Unspecified injury of lower back, initial encounter: Secondary | ICD-10-CM | POA: Insufficient documentation

## 2019-02-20 DIAGNOSIS — Y998 Other external cause status: Secondary | ICD-10-CM | POA: Insufficient documentation

## 2019-02-20 DIAGNOSIS — I10 Essential (primary) hypertension: Secondary | ICD-10-CM | POA: Insufficient documentation

## 2019-02-20 DIAGNOSIS — M545 Low back pain, unspecified: Secondary | ICD-10-CM

## 2019-02-20 DIAGNOSIS — J45909 Unspecified asthma, uncomplicated: Secondary | ICD-10-CM | POA: Insufficient documentation

## 2019-02-20 DIAGNOSIS — Y92002 Bathroom of unspecified non-institutional (private) residence single-family (private) house as the place of occurrence of the external cause: Secondary | ICD-10-CM | POA: Diagnosis not present

## 2019-02-20 DIAGNOSIS — Y9389 Activity, other specified: Secondary | ICD-10-CM | POA: Insufficient documentation

## 2019-02-20 DIAGNOSIS — W01198A Fall on same level from slipping, tripping and stumbling with subsequent striking against other object, initial encounter: Secondary | ICD-10-CM | POA: Diagnosis not present

## 2019-02-20 DIAGNOSIS — W19XXXA Unspecified fall, initial encounter: Secondary | ICD-10-CM

## 2019-02-20 LAB — POC URINE PREG, ED: Preg Test, Ur: NEGATIVE

## 2019-02-20 MED ORDER — PREDNISONE 10 MG PO TABS: ORAL_TABLET | ORAL | 0 refills | Status: DC

## 2019-02-20 MED ORDER — DICLOFENAC SODIUM 50 MG PO TBEC: 50.0000 mg | DELAYED_RELEASE_TABLET | Freq: Two times a day (BID) | ORAL | 0 refills | Status: DC

## 2019-02-20 MED ORDER — HYDROMORPHONE HCL 1 MG/ML IJ SOLN
1.0000 mg | Freq: Once | INTRAMUSCULAR | Status: AC
  Administered 2019-02-20: 1 mg via INTRAMUSCULAR
  Filled 2019-02-20: qty 1

## 2019-02-20 NOTE — ED Triage Notes (Signed)
Patient c/o upper and lower back pain after falling last night. Per patient she got wrapped in shower curtain causing her to fall. Per patient hit side of tube, toilet, and floor. Denies hitting head, LOC, or dizziness. CNS intact. Patient ambulatory. Denies any problems with urination or BMs.

## 2019-02-20 NOTE — Discharge Instructions (Addendum)
X-ray is normal.  You will be sore for several days.  Prescription for prednisone and anti-inflammatory medication.  Ice pack.

## 2019-02-24 NOTE — ED Provider Notes (Signed)
[facility] EMERGENCY DEPARTMENT Provider Note   CSN: [id number] Arrival date & time: 02/20/19  1112    History   Chief Complaint Chief Complaint  Patient presents with  . Back Pain    HPI Monica Cox is a 28 y.o. female.     Status post accidental fall last night striking the tub, toilet, floor on her lower back.  No head trauma, neck pain, loss of consciousness, neurological deficits, radicular pain.  Does complain of low back pain.  Severity is mild to moderate.  Palpation position make pain worse.     Past Medical History:  Diagnosis Date  . Anemia   . Asthma    inhaler as needed  . Depression   . GERD (gastroesophageal reflux disease)   . H/O trichomonas 08/08/2014   2015 pregnancy   . History of chlamydia 03/19/2016   2015 pregnancy   . History of kidney stones   . Hypertension    pp htn  . Nausea 02/06/2016  . Reflux   . Smoker 02/06/2016  . Trichomonas infection 08-08-2014  . Vaginal Pap smear, abnormal   . Vertigo 09/21/2014   antivert rx 09/21/2014      Patient Active Problem List   Diagnosis Date Noted  . GERD (gastroesophageal reflux disease) 08/26/2018  . Hematemesis 08/26/2018  . Constipation 08/26/2018  . Nexplanon insertion 03/17/2018  . Flu-like symptoms 09/07/2017  . Depression, postpartum 10/11/2016    Class: Acute  . Marijuana use 03/24/2016  . Depression with anxiety 03/19/2016  . Smoker 02/06/2016  . Pre-eclampsia in postpartum period 11/26/2014  . Heart murmur 09/21/2014    Past Surgical History:  Procedure Laterality Date  . DILATION AND CURETTAGE OF UTERUS     [facility] Dr. Despina Hidden performed  . ESOPHAGOGASTRODUODENOSCOPY (EGD) WITH PROPOFOL N/A 09/23/2018   Procedure: ESOPHAGOGASTRODUODENOSCOPY (EGD) WITH PROPOFOL;  Surgeon: Corbin Ade, MD;  Location: [facility];  Service: Endoscopy;  Laterality: N/A;  8:45am  . ESOPHAGOGASTRODUODENOSCOPY ENDOSCOPY       OB History    Gravida  5   Para  4   Term  4   Preterm   0   AB  1   Living  4     SAB  1   TAB  0   Ectopic  0   Multiple  0   Live Births  4            Home Medications    Prior to Admission medications   Medication Sig Start Date End Date Taking? Authorizing Provider  albuterol (PROVENTIL HFA;VENTOLIN HFA) 108 (90 Base) MCG/ACT inhaler Inhale 1-2 puffs into the lungs every 4 (four) hours as needed for wheezing or shortness of breath. Patient not taking: Reported on 02/20/2019 07/02/16   Cheral Marker, CNM  diclofenac (VOLTAREN) 50 MG EC tablet Take 1 tablet (50 mg total) by mouth 2 (two) times daily. 02/20/19   Donnetta Hutching, MD  escitalopram (LEXAPRO) 10 MG tablet Take 1.5 tablets (15 mg total) by mouth daily. Patient not taking: Reported on 02/20/2019 08/25/18   Cresenzo-Dishmon, Scarlette Calico, CNM  megestrol (MEGACE) 40 MG tablet Take 80-120 mg by mouth daily.    [provider]  omeprazole (PRILOSEC) 20 MG capsule Take 1 capsule (20 mg total) by mouth 2 (two) times daily before a meal. Patient not taking: Reported on 02/20/2019 08/26/18   Anice Paganini, NP  predniSONE (DELTASONE) 10 MG tablet 3 tabs for 3 days, 2 tabs for 3 days,  1 tab for 3 days. 02/20/19   Donnetta Hutching, MD  promethazine (PHENERGAN) 25 MG tablet Take 1 tablet (25 mg total) by mouth every 6 (six) hours as needed for nausea or vomiting. Patient not taking: Reported on 02/20/2019 10/05/18   Jacklyn Shell, CNM    Family History Family History  Problem Relation Age of Onset  . Ulcers Father   . Hypertension Father   . Hypertension Mother   . Ulcers Paternal Grandmother   . Diabetes Paternal Grandmother   . Ulcers Paternal Aunt   . Liver disease Neg Hx   . Colon cancer Neg Hx   . Gastric cancer Neg Hx   . Esophageal cancer Neg Hx     Social History Social History   Tobacco Use  . Smoking status: Former Smoker    Years: 3.00    Types: Cigars    Last attempt to quit: 09/20/2016    Years since quitting: 2.4  . Smokeless tobacco: Never  Used  Substance Use Topics  . Alcohol use: Yes    Alcohol/week: 0.0 standard drinks    Comment: occasionally  . Drug use: No     Allergies   Patient has no known allergies.   Review of Systems Review of Systems  All other systems reviewed and are negative.    Physical Exam Updated Vital Signs BP 125/66 (BP Location: Left Arm)   Pulse 76   Temp 98.8 F (37.1 C) (Oral)   Resp 18   Ht 5' 3.5" (1.613 m)   Wt 83.9 kg   SpO2 98%   BMI 32.26 kg/m   Physical Exam Vitals signs and nursing note reviewed.  Constitutional:      Appearance: She is well-developed.  HENT:     Head: Normocephalic and atraumatic.  Eyes:     Conjunctiva/sclera: Conjunctivae normal.  Neck:     Musculoskeletal: Neck supple.  Cardiovascular:     Rate and Rhythm: Normal rate and regular rhythm.  Pulmonary:     Effort: Pulmonary effort is normal.     Breath sounds: Normal breath sounds.  Abdominal:     General: Bowel sounds are normal.     Palpations: Abdomen is soft.  Musculoskeletal:     Comments: Tender paraspinous lumbar spine right greater than left  Skin:    General: Skin is warm and dry.  Neurological:     Mental Status: She is alert and oriented to person, place, and time.  Psychiatric:        Behavior: Behavior normal.      ED Treatments / Results  Labs (all labs ordered are listed, but only abnormal results are displayed) Labs Reviewed  POC URINE PREG, ED    EKG None  Radiology No results found.  Procedures Procedures (including critical care time)  Medications Ordered in ED Medications  HYDROmorphone (DILAUDID) injection 1 mg (1 mg Intramuscular Given 02/20/19 1154)     Initial Impression / Assessment and Plan / ED Course  I have reviewed the triage vital signs and the nursing notes.  Pertinent labs & imaging results that were available during my care of the patient were reviewed by me and considered in my medical decision making (see chart for details).        Plain films of lumbar spine negative.  No neuro deficits.  Dilaudid 1 mg IM given in the ED.  Discharge medication Voltaren 50 mg and prednisone.   Final Clinical Impressions(s) / ED Diagnoses   Final diagnoses:  Fall,  initial encounter  Acute right-sided low back pain without sciatica    ED Discharge Orders         Ordered    diclofenac (VOLTAREN) 50 MG EC tablet  2 times daily     02/20/19 1316    predniSONE (DELTASONE) 10 MG tablet     02/20/19 1316           Donnetta Hutchingook, Sharayah Renfrow, MD 02/24/19 1918

## 2019-05-22 IMAGING — DX LUMBAR SPINE - COMPLETE 4+ VIEW
5 series · 5 of 5 positions shown · non-contrast
Comparison: CT abdomen pelvis-08/23/2015

CLINICAL DATA: Fall last night, now with back pain

EXAM:
LUMBAR SPINE - COMPLETE 4+ VIEW

[l-spine ap]
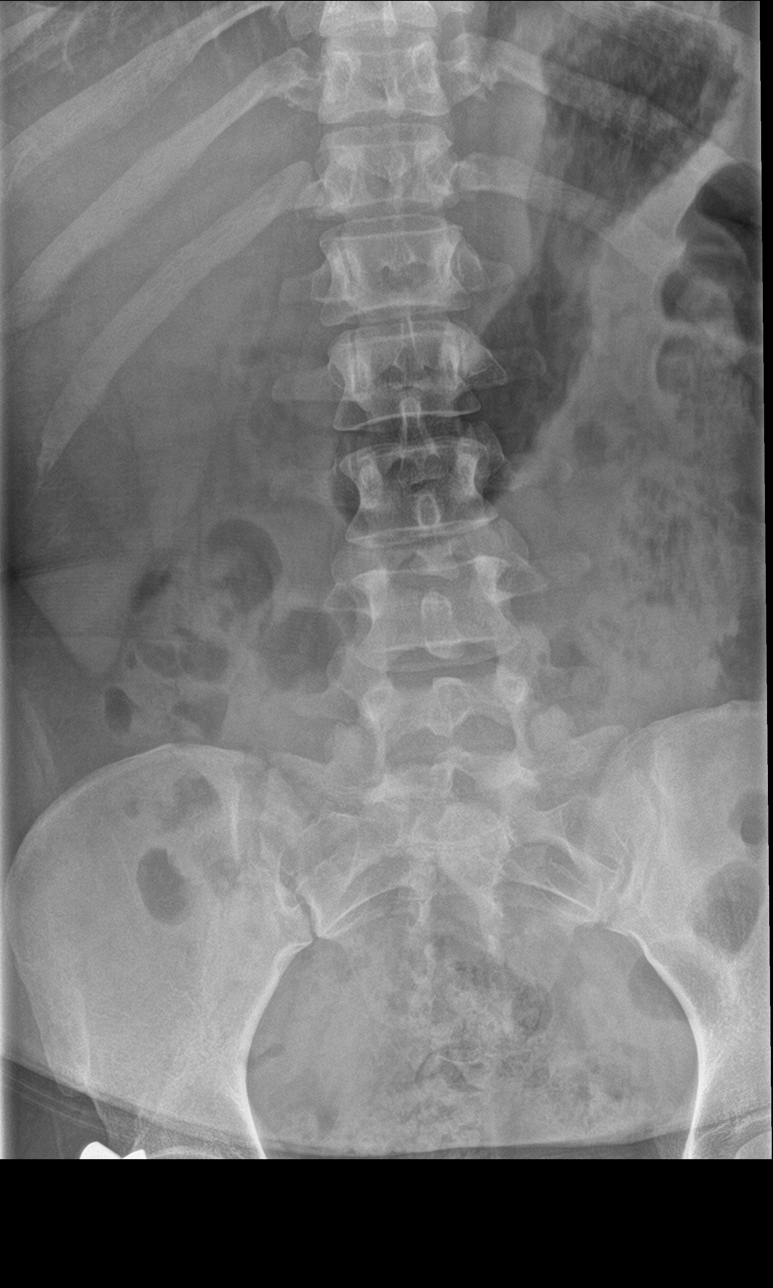

[l-spine obl (1 of 2)]
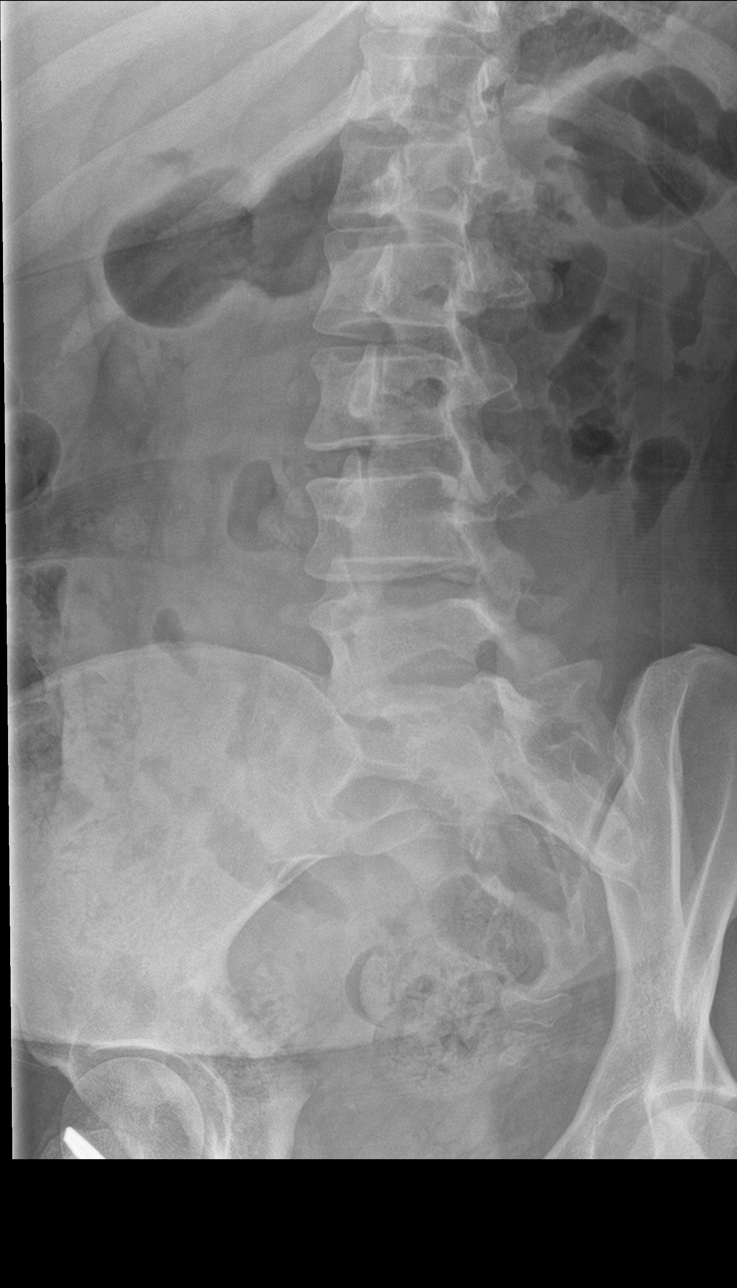

[l-spine obl (2 of 2)]
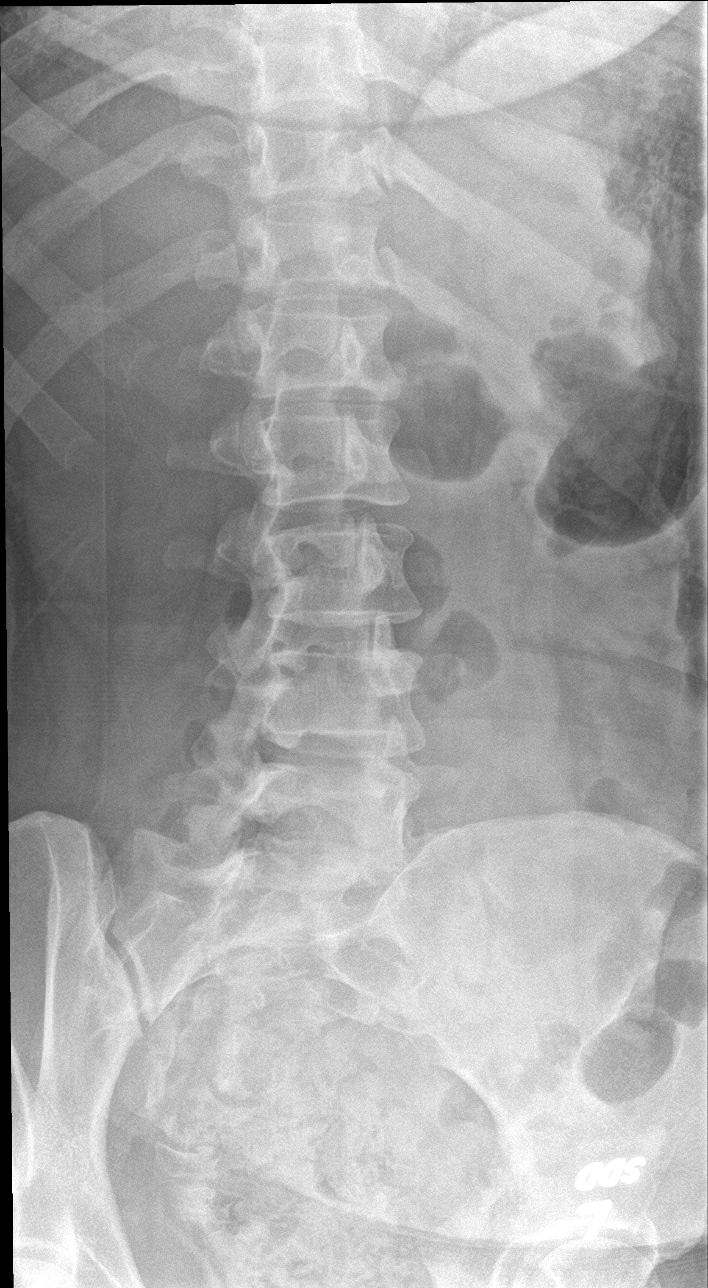

[l-spine lat]
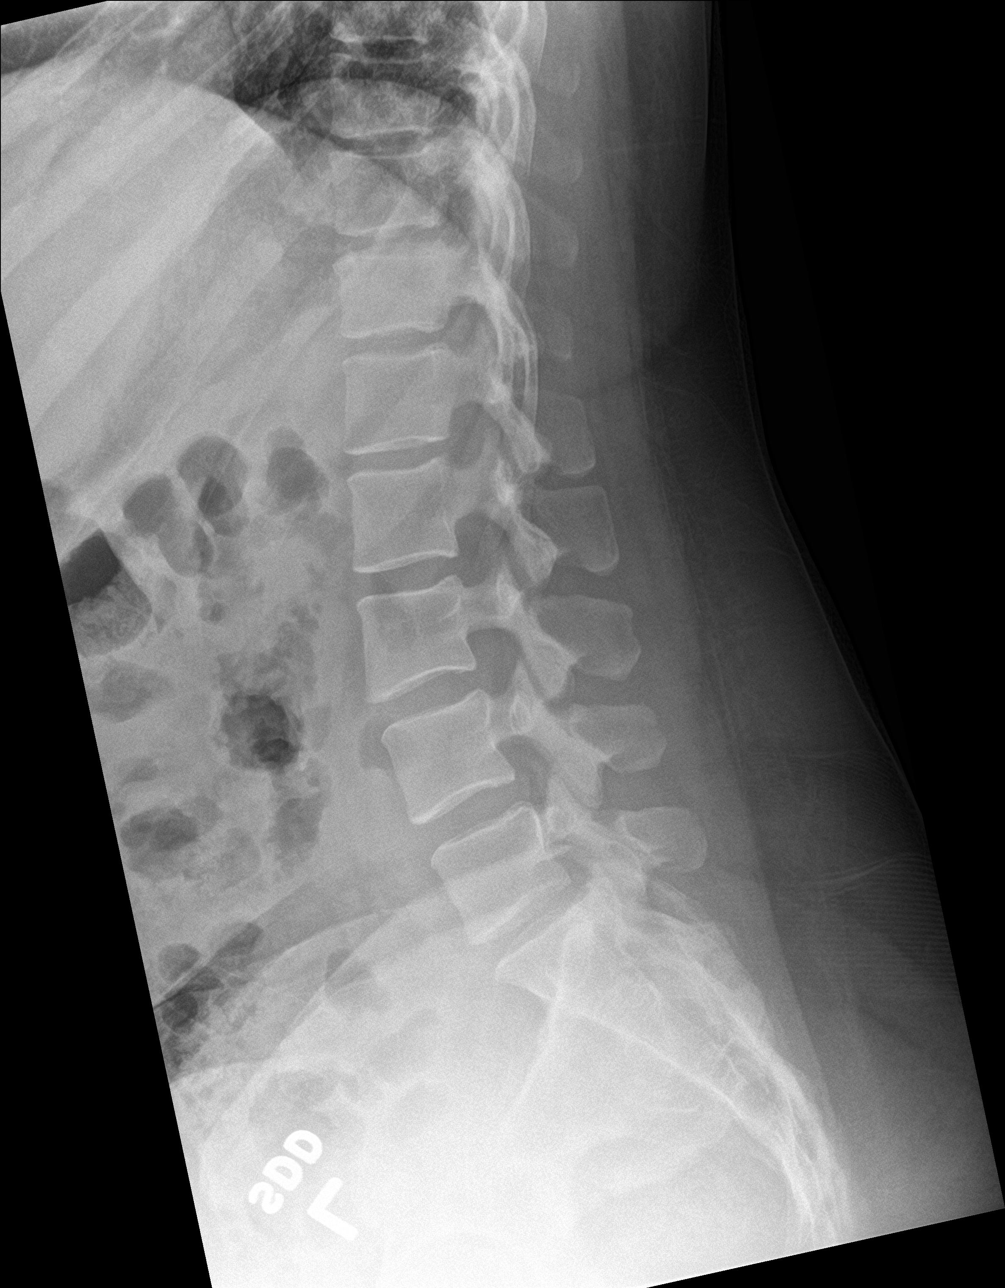

[l-spine spot]
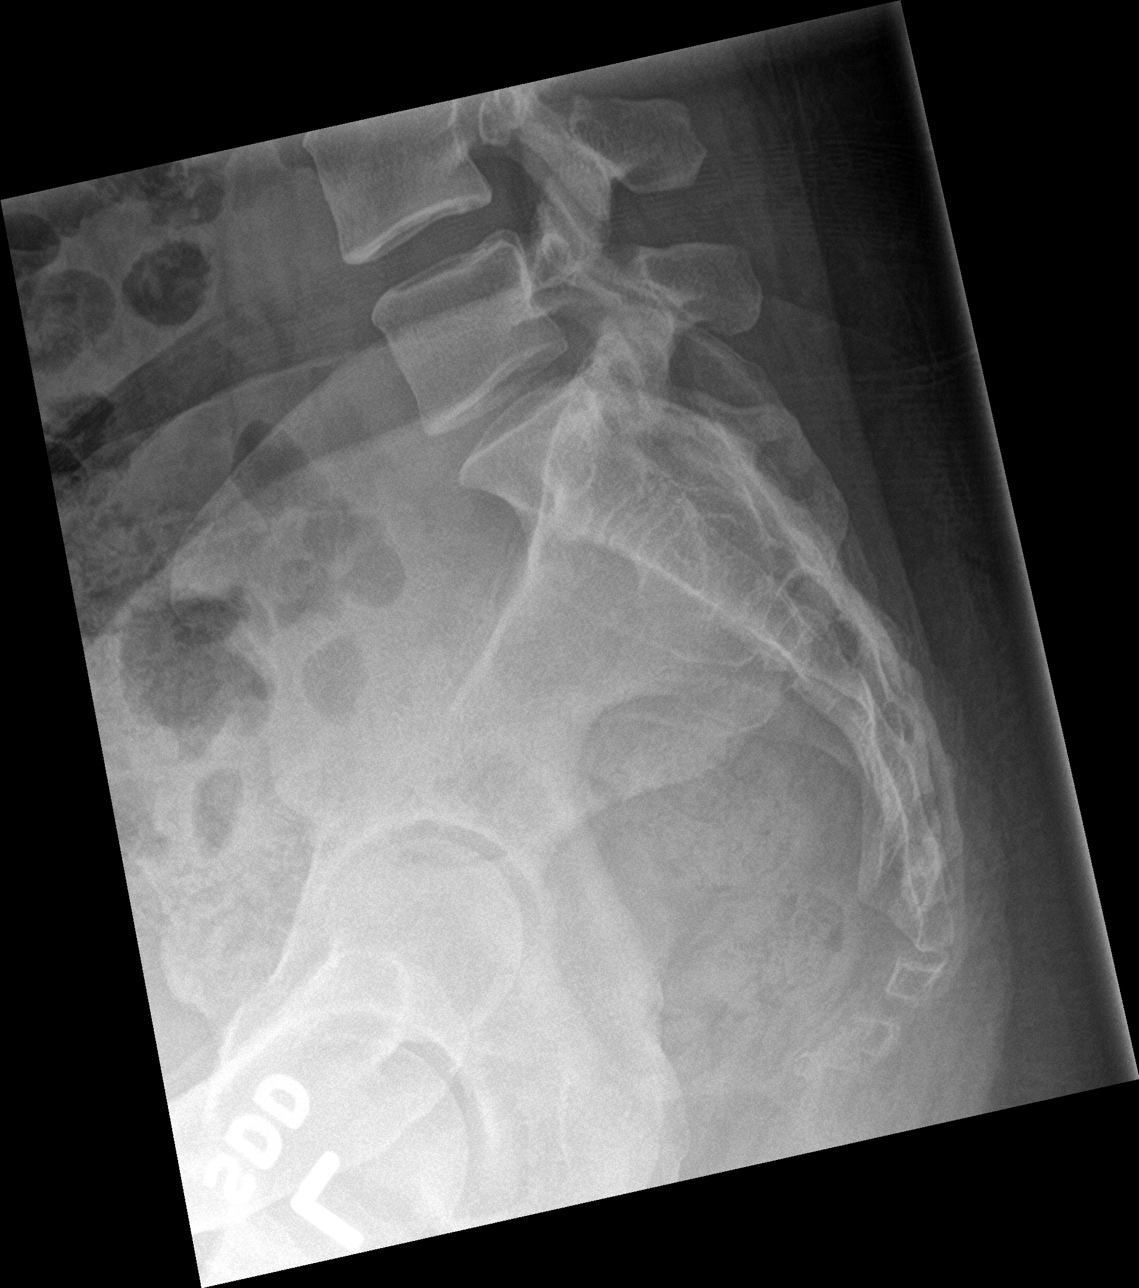

[5 of 5 positions shown; findings below may reference images not displayed]

FINDINGS: There are 5 non rib-bearing lumbar type vertebral bodies.

Normal alignment of the lumbar spine. No anterolisthesis or
retrolisthesis. No definite pars defects.

Lumbar vertebral body heights appear preserved.

Lumbar intervertebral disc space heights appear preserved.

Limited visualization of the bilateral SI joints is normal.

Large colonic stool burden without evidence of enteric obstruction.
IMPRESSION: No acute findings.

## 2019-05-30 ENCOUNTER — Other Ambulatory Visit: Payer: Self-pay

## 2019-05-30 DIAGNOSIS — Z20822 Contact with and (suspected) exposure to covid-19: Secondary | ICD-10-CM

## 2019-05-31 LAB — NOVEL CORONAVIRUS, NAA: SARS-CoV-2, NAA: NOT DETECTED

## 2019-06-09 ENCOUNTER — Other Ambulatory Visit: Payer: Self-pay | Admitting: *Deleted

## 2019-06-09 DIAGNOSIS — Z20822 Contact with and (suspected) exposure to covid-19: Secondary | ICD-10-CM

## 2019-06-10 LAB — NOVEL CORONAVIRUS, NAA: SARS-CoV-2, NAA: NOT DETECTED

## 2019-10-04 ENCOUNTER — Telehealth: Payer: Self-pay | Admitting: *Deleted

## 2019-10-04 ENCOUNTER — Other Ambulatory Visit: Payer: Self-pay | Admitting: Women's Health

## 2019-10-04 MED ORDER — MEGESTROL ACETATE 40 MG PO TABS: ORAL_TABLET | ORAL | 1 refills | Status: DC

## 2019-10-04 NOTE — Telephone Encounter (Signed)
Patient has Nexplanon in place. States she has been bleeding since Nov. 30 and would like some medication to be sent to her pharmacy to stop it.  Has had Megace in the past.

## 2019-12-16 ENCOUNTER — Emergency Department (HOSPITAL_COMMUNITY)
Admission: EM | Admit: 2019-12-16 | Discharge: 2019-12-16 | Disposition: A | Discharge status: Home / Self Care | Payer: 59 | Attending: Emergency Medicine | Admitting: Emergency Medicine

## 2019-12-16 ENCOUNTER — Other Ambulatory Visit: Payer: Self-pay

## 2019-12-16 ENCOUNTER — Encounter (HOSPITAL_COMMUNITY): Payer: Self-pay

## 2019-12-16 DIAGNOSIS — F430 Acute stress reaction: Secondary | ICD-10-CM | POA: Insufficient documentation

## 2019-12-16 DIAGNOSIS — J45909 Unspecified asthma, uncomplicated: Secondary | ICD-10-CM | POA: Diagnosis not present

## 2019-12-16 DIAGNOSIS — R200 Anesthesia of skin: Secondary | ICD-10-CM | POA: Diagnosis not present

## 2019-12-16 DIAGNOSIS — Z87891 Personal history of nicotine dependence: Secondary | ICD-10-CM | POA: Diagnosis not present

## 2019-12-16 DIAGNOSIS — R202 Paresthesia of skin: Secondary | ICD-10-CM | POA: Insufficient documentation

## 2019-12-16 DIAGNOSIS — I10 Essential (primary) hypertension: Secondary | ICD-10-CM | POA: Diagnosis not present

## 2019-12-16 DIAGNOSIS — F410 Panic disorder [episodic paroxysmal anxiety] without agoraphobia: Secondary | ICD-10-CM | POA: Insufficient documentation

## 2019-12-16 MED ORDER — HYDROXYZINE HCL 25 MG PO TABS: 25.0000 mg | ORAL_TABLET | Freq: Once | ORAL | Status: DC

## 2019-12-16 MED ORDER — SODIUM CHLORIDE 0.9 % IV BOLUS: 2000.0000 mL | Freq: Once | INTRAVENOUS | Status: DC

## 2019-12-16 MED ORDER — HYDROXYZINE HCL 25 MG PO TABS
25.0000 mg | ORAL_TABLET | Freq: Once | ORAL | Status: AC
  Administered 2019-12-16: 25 mg via ORAL
  Filled 2019-12-16: qty 1

## 2019-12-16 NOTE — ED Provider Notes (Signed)
[facility] EMERGENCY DEPARTMENT Provider Note   CSN: [id number] Arrival date & time: 12/16/19  1227     History Chief Complaint  Patient presents with  . Panic Attack    Monica Cox is a 29 y.o. female comes in for panic attack She awoke this morning feeling fine. She took 0.5 mg of ativan which she is prescribed as needed but takes every morning. Around 11:00 AM her boss called her and threatened her job because her computer has not been working and she has been unable to complete her tasks. She has a hx of previous panic attacks, but states that it has "never been this bad." She had an overwhelming sense of doom. She began hyperventilating and felt numbness and tingling in her hands and feet. She has no chest pain, hemoptysis. She has no hx of previous PE. RF for PE include vaping nicotine products and Nexplanon implant.    HPI     Past Medical History:  Diagnosis Date  . Anemia   . Asthma    inhaler as needed  . Depression   . GERD (gastroesophageal reflux disease)   . H/O trichomonas 08/08/2014   2015 pregnancy   . History of chlamydia 03/19/2016   2015 pregnancy   . History of kidney stones   . Hypertension    pp htn  . Nausea 02/06/2016  . Reflux   . Smoker 02/06/2016  . Trichomonas infection 08-08-2014  . Vaginal Pap smear, abnormal   . Vertigo 09/21/2014   antivert rx 09/21/2014      Patient Active Problem List   Diagnosis Date Noted  . GERD (gastroesophageal reflux disease) 08/26/2018  . Hematemesis 08/26/2018  . Constipation 08/26/2018  . Nexplanon insertion 03/17/2018  . Flu-like symptoms 09/07/2017  . Depression, postpartum 10/11/2016    Class: Acute  . Marijuana use 03/24/2016  . Depression with anxiety 03/19/2016  . Smoker 02/06/2016  . Pre-eclampsia in postpartum period 11/26/2014  . Heart murmur 09/21/2014    Past Surgical History:  Procedure Laterality Date  . DILATION AND CURETTAGE OF UTERUS     [facility] Dr. Elonda Husky performed  .  ESOPHAGOGASTRODUODENOSCOPY (EGD) WITH PROPOFOL N/A 09/23/2018   Procedure: ESOPHAGOGASTRODUODENOSCOPY (EGD) WITH PROPOFOL;  Surgeon: Daneil Dolin, MD;  Location: [facility];  Service: Endoscopy;  Laterality: N/A;  8:45am  . ESOPHAGOGASTRODUODENOSCOPY ENDOSCOPY       OB History    Gravida  5   Para  4   Term  4   Preterm  0   AB  1   Living  4     SAB  1   TAB  0   Ectopic  0   Multiple  0   Live Births  4           Family History  Problem Relation Age of Onset  . Ulcers Father   . Hypertension Father   . Hypertension Mother   . Ulcers Paternal Grandmother   . Diabetes Paternal Grandmother   . Ulcers Paternal Aunt   . Liver disease Neg Hx   . Colon cancer Neg Hx   . Gastric cancer Neg Hx   . Esophageal cancer Neg Hx     Social History   Tobacco Use  . Smoking status: Former Smoker    Years: 3.00    Types: Cigars    Quit date: 09/20/2016    Years since quitting: 3.2  . Smokeless tobacco: Never Used  Substance Use Topics  .  Alcohol use: Yes    Alcohol/week: 0.0 standard drinks    Comment: occasionally  . Drug use: No    Home Medications Prior to Admission medications   Medication Sig Start Date End Date Taking? Authorizing Provider  albuterol (PROVENTIL HFA;VENTOLIN HFA) 108 (90 Base) MCG/ACT inhaler Inhale 1-2 puffs into the lungs every 4 (four) hours as needed for wheezing or shortness of breath. Patient not taking: Reported on 02/20/2019 07/02/16   Cheral Marker, CNM  diclofenac (VOLTAREN) 50 MG EC tablet Take 1 tablet (50 mg total) by mouth 2 (two) times daily. 02/20/19   Donnetta Hutching, MD  escitalopram (LEXAPRO) 10 MG tablet Take 1.5 tablets (15 mg total) by mouth daily. Patient not taking: Reported on 02/20/2019 08/25/18   Cresenzo-Dishmon, Scarlette Calico, CNM  megestrol (MEGACE) 40 MG tablet 3x5d, 2x5d, then 1 daily to help control vaginal bleeding. Stop taking when bleeding stops. 10/04/19   Cheral Marker, CNM  omeprazole (PRILOSEC) 20  MG capsule Take 1 capsule (20 mg total) by mouth 2 (two) times daily before a meal. Patient not taking: Reported on 02/20/2019 08/26/18   Anice Paganini, NP  predniSONE (DELTASONE) 10 MG tablet 3 tabs for 3 days, 2 tabs for 3 days, 1 tab for 3 days. 02/20/19   Donnetta Hutching, MD  promethazine (PHENERGAN) 25 MG tablet Take 1 tablet (25 mg total) by mouth every 6 (six) hours as needed for nausea or vomiting. Patient not taking: Reported on 02/20/2019 10/05/18   Jacklyn Shell, CNM    Allergies    Patient has no known allergies.  Review of Systems   Review of Systems Ten systems reviewed and are negative for acute change, except as noted in the HPI.   Physical Exam Updated Vital Signs BP (!) 155/75 (BP Location: Right Arm)   Pulse (!) 104   Temp 98.3 F (36.8 C) (Oral)   Resp (!) 24   Ht 5\' 3"  (1.6 m)   Wt 87.3 kg   LMP 12/14/2019   SpO2 100%   BMI 34.08 kg/m   Physical Exam Vitals and nursing note reviewed.  Constitutional:      General: She is not in acute distress.    Appearance: She is well-developed. She is not diaphoretic.  HENT:     Head: Normocephalic and atraumatic.  Eyes:     General: No scleral icterus.    Conjunctiva/sclera: Conjunctivae normal.  Cardiovascular:     Rate and Rhythm: Normal rate and regular rhythm.     Heart sounds: Normal heart sounds. No murmur. No friction rub. No gallop.   Pulmonary:     Effort: Pulmonary effort is normal. No respiratory distress.     Breath sounds: Normal breath sounds.  Abdominal:     General: Bowel sounds are normal. There is no distension.     Palpations: Abdomen is soft. There is no mass.     Tenderness: There is no abdominal tenderness. There is no guarding.  Musculoskeletal:     Cervical back: Normal range of motion.  Skin:    General: Skin is warm and dry.  Neurological:     Mental Status: She is alert and oriented to person, place, and time.     Comments: tremulous  Psychiatric:        Mood and Affect:  Mood is anxious. Affect is tearful.        Behavior: Behavior normal.     ED Results / Procedures / Treatments   Labs (all labs ordered are  listed, but only abnormal results are displayed) Labs Reviewed - No data to display  EKG None  Radiology No results found.  Procedures Procedures (including critical care time)  Medications Ordered in ED Medications - No data to display  ED Course  I have reviewed the triage vital signs and the nursing notes.  Pertinent labs & imaging results that were available during my care of the patient were reviewed by me and considered in my medical decision making (see chart for details).    MDM Rules/Calculators/A&P                      Prior to my evaluation patient was speaking calmly on the phone with her significant other, as I walked in she hung up and then began becoming very tearful, shaking her leg speaking rapidly.  Patient's vital signs have normalized without intervention she has no current shortness of breath.  Legs are equal, symmetric without swelling or signs of DVT.  I have suggested mindfulness meditation, outpatient therapy.  Patient given a dose of hydroxyzine and appears appropriate for discharge at this time. Final Clinical Impression(s) / ED Diagnoses Final diagnoses:  None    Rx / DC Orders ED Discharge Orders    None       Arthor Captain, PA-C 12/16/19 1755    Bethann Berkshire, MD 12/17/19 1948

## 2019-12-16 NOTE — Discharge Instructions (Signed)
Get help right away if: You have serious thoughts about hurting yourself or others. You lose consciousness or cough up blood.

## 2019-12-16 NOTE — ED Triage Notes (Addendum)
Pt reports having a panic attack today after receiving "upsetting panic attack". States she has a history of panic attacks. Pt reports felling "worked up". Tearful in triage.   Pt states she takes 0.5 mg Ativan, Dextroamp-Amphetamine, and Lexapro. Reports taking this AM.

## 2019-12-16 NOTE — ED Notes (Signed)
Pt reports receiving a phone call from her employer this morning that upset her emotionally. Pt took her anxiety medications afterwards to help calm her anxiety, but the medication was unsuccessful. Pt reports that around 1130 today she started to really get panicked. Pt presents alert and oriented x 4, anxious, trembling and teeth chattering. Pt reports she has started 3 new mental health medications in the last 2 weeks by her PCP PA. Pt denies the use of a counselor or psychiatrist currently, although she does report that her PCP PA did offer this resource. Pt feels that because of her "culture" if she cannot handle things on her own then she "isn't praying enough, I'm not reading my bible enough, I don't have enough Jesus in me". Pt reports her spouse is supportive of her anxiety but "doesn't really understand it". RN attempted to speak with pt about current stressors and the events leading up to the panic attack today along with offering deep breathing techniques, calming exercises and supportive communication.

## 2020-06-13 ENCOUNTER — Other Ambulatory Visit: Payer: Self-pay

## 2020-06-13 ENCOUNTER — Encounter (HOSPITAL_COMMUNITY): Payer: Self-pay | Admitting: *Deleted

## 2020-06-13 ENCOUNTER — Emergency Department (HOSPITAL_COMMUNITY)
Admission: EM | Admit: 2020-06-13 | Discharge: 2020-06-14 | Disposition: A | Discharge status: Home / Self Care | Payer: HRSA Program | Attending: Emergency Medicine | Admitting: Emergency Medicine

## 2020-06-13 DIAGNOSIS — I10 Essential (primary) hypertension: Secondary | ICD-10-CM | POA: Insufficient documentation

## 2020-06-13 DIAGNOSIS — M791 Myalgia, unspecified site: Secondary | ICD-10-CM | POA: Diagnosis present

## 2020-06-13 DIAGNOSIS — U071 COVID-19: Secondary | ICD-10-CM | POA: Diagnosis not present

## 2020-06-13 DIAGNOSIS — Z79899 Other long term (current) drug therapy: Secondary | ICD-10-CM | POA: Diagnosis not present

## 2020-06-13 DIAGNOSIS — Z87891 Personal history of nicotine dependence: Secondary | ICD-10-CM | POA: Insufficient documentation

## 2020-06-13 DIAGNOSIS — J45909 Unspecified asthma, uncomplicated: Secondary | ICD-10-CM | POA: Insufficient documentation

## 2020-06-13 DIAGNOSIS — Z7951 Long term (current) use of inhaled steroids: Secondary | ICD-10-CM | POA: Insufficient documentation

## 2020-06-13 NOTE — ED Notes (Signed)
Pt states she has not been vaccinated for Covid.

## 2020-06-13 NOTE — ED Triage Notes (Signed)
Pt with covid exposure on Saturday at a funeral.  Pt with body aches and productive cough since Monday.  Denies fever, N/V/D.  + loss of taste and smell + loss of appetite.

## 2020-06-14 ENCOUNTER — Other Ambulatory Visit: Payer: Self-pay

## 2020-06-14 ENCOUNTER — Telehealth (HOSPITAL_COMMUNITY): Payer: Self-pay | Admitting: Nurse Practitioner

## 2020-06-14 LAB — SARS CORONAVIRUS 2 BY RT PCR (HOSPITAL ORDER, PERFORMED IN ~~LOC~~ HOSPITAL LAB): SARS Coronavirus 2: POSITIVE — AB

## 2020-06-14 MED ORDER — BENZONATATE 200 MG PO CAPS: 200.0000 mg | ORAL_CAPSULE | Freq: Three times a day (TID) | ORAL | 0 refills | Status: DC | PRN

## 2020-06-14 MED ORDER — PREDNISONE 50 MG PO TABS
60.0000 mg | ORAL_TABLET | Freq: Once | ORAL | Status: AC
  Administered 2020-06-14: 60 mg via ORAL
  Filled 2020-06-14: qty 1

## 2020-06-14 MED ORDER — PREDNISONE 20 MG PO TABS: 40.0000 mg | ORAL_TABLET | Freq: Every day | ORAL | 0 refills | Status: DC

## 2020-06-14 MED ORDER — IBUPROFEN 800 MG PO TABS
800.0000 mg | ORAL_TABLET | Freq: Once | ORAL | Status: AC
  Administered 2020-06-14: 800 mg via ORAL
  Filled 2020-06-14: qty 1

## 2020-06-14 MED ORDER — ACETAMINOPHEN 500 MG PO TABS
1000.0000 mg | ORAL_TABLET | Freq: Once | ORAL | Status: AC
  Administered 2020-06-14: 1000 mg via ORAL
  Filled 2020-06-14: qty 2

## 2020-06-14 NOTE — ED Notes (Signed)
Pt discharged during computer downtime

## 2020-06-14 NOTE — Telephone Encounter (Signed)
Called to Discuss with patient about Covid symptoms and the use of the monoclonal antibody infusion for those with mild to moderate Covid symptoms and at a high risk of hospitalization.     Pt appears to qualify for this infusion due to co-morbid conditions and/or a member of an at-risk group in accordance with the FDA Emergency Use Authorization.    Unable to reach pt - left voicemail requesting return call. My chart message sent as well.   Ocie Bob, AGNP-C

## 2020-06-14 NOTE — ED Notes (Signed)
CRITICAL VALUE ALERT  Critical Value:  COVID +  Date & Time Notied:  06/14/2020 0114  Provider Notified: Dr. Blinda Leatherwood  Orders Received/Actions taken: see chart

## 2020-06-15 NOTE — ED Provider Notes (Signed)
[facility] EMERGENCY DEPARTMENT Provider Note   CSN: [id number] Arrival date & time: 06/13/20  4098     History Chief Complaint  Patient presents with  . Covid Exposure    Monica Cox is a 30 y.o. female.  Patient reports recent exposure to Covid.  She is here with complaints of body aches, productive cough.  She has had loss of taste and smell.  Has not been eating much but no nausea, vomiting or diarrhea.        Past Medical History:  Diagnosis Date  . Anemia   . Asthma    inhaler as needed  . Depression   . GERD (gastroesophageal reflux disease)   . H/O trichomonas 08/08/2014   2015 pregnancy   . History of chlamydia 03/19/2016   2015 pregnancy   . History of kidney stones   . Hypertension    pp htn  . Nausea 02/06/2016  . Reflux   . Smoker 02/06/2016  . Trichomonas infection 08-08-2014  . Vaginal Pap smear, abnormal   . Vertigo 09/21/2014   antivert rx 09/21/2014      Patient Active Problem List   Diagnosis Date Noted  . GERD (gastroesophageal reflux disease) 08/26/2018  . Hematemesis 08/26/2018  . Constipation 08/26/2018  . Nexplanon insertion 03/17/2018  . Flu-like symptoms 09/07/2017  . Depression, postpartum 10/11/2016    Class: Acute  . Marijuana use 03/24/2016  . Depression with anxiety 03/19/2016  . Smoker 02/06/2016  . Pre-eclampsia in postpartum period 11/26/2014  . Heart murmur 09/21/2014    Past Surgical History:  Procedure Laterality Date  . DILATION AND CURETTAGE OF UTERUS     [facility] Dr. Despina Hidden performed  . ESOPHAGOGASTRODUODENOSCOPY (EGD) WITH PROPOFOL N/A 09/23/2018   Procedure: ESOPHAGOGASTRODUODENOSCOPY (EGD) WITH PROPOFOL;  Surgeon: Corbin Ade, MD;  Location: [facility];  Service: Endoscopy;  Laterality: N/A;  8:45am  . ESOPHAGOGASTRODUODENOSCOPY ENDOSCOPY       OB History    Gravida  5   Para  4   Term  4   Preterm  0   AB  1   Living  4     SAB  1   TAB  0   Ectopic  0   Multiple  0   Live  Births  4           Family History  Problem Relation Age of Onset  . Ulcers Father   . Hypertension Father   . Hypertension Mother   . Ulcers Paternal Grandmother   . Diabetes Paternal Grandmother   . Ulcers Paternal Aunt   . Liver disease Neg Hx   . Colon cancer Neg Hx   . Gastric cancer Neg Hx   . Esophageal cancer Neg Hx     Social History   Tobacco Use  . Smoking status: Former Smoker    Years: 3.00    Types: Cigars    Quit date: 09/20/2016    Years since quitting: 3.7  . Smokeless tobacco: Never Used  Vaping Use  . Vaping Use: Never used  Substance Use Topics  . Alcohol use: Yes    Alcohol/week: 0.0 standard drinks    Comment: occasionally  . Drug use: No    Home Medications Prior to Admission medications   Medication Sig Start Date End Date Taking? Authorizing Provider  albuterol (PROVENTIL HFA;VENTOLIN HFA) 108 (90 Base) MCG/ACT inhaler Inhale 1-2 puffs into the lungs every 4 (four) hours as needed for wheezing or shortness of  breath. 07/02/16   Cheral Marker, CNM  albuterol (PROVENTIL) (2.5 MG/3ML) 0.083% nebulizer solution Take 2.5 mg by nebulization as needed for shortness of breath.  12/02/19   [provider]  amphetamine-dextroamphetamine (ADDERALL) 10 MG tablet Take 10 mg by mouth 2 (two) times daily. 12/02/19   [provider]  benzonatate (TESSALON) 200 MG capsule Take 1 capsule (200 mg total) by mouth 3 (three) times daily as needed for cough. 06/14/20   Gilda Crease, MD  escitalopram (LEXAPRO) 20 MG tablet Take 20 mg by mouth daily. 12/01/19   [provider]  LORazepam (ATIVAN) 0.5 MG tablet Take 0.5 mg by mouth 2 (two) times daily.  11/29/19   [provider]  naproxen sodium (ALEVE) 220 MG tablet Take 220 mg by mouth as needed (headache).    [provider]  predniSONE (DELTASONE) 20 MG tablet Take 2 tablets (40 mg total) by mouth daily with breakfast. 06/14/20   Aquinnah Devin, Canary Brim, MD     Allergies    Patient has no known allergies.  Review of Systems   Review of Systems  Constitutional: Positive for fever.  Respiratory: Positive for cough.   Musculoskeletal: Positive for myalgias.  All other systems reviewed and are negative.   Physical Exam Updated Vital Signs BP 123/79 (BP Location: Right Arm)   Pulse 91   Temp 99.7 F (37.6 C) (Oral)   Ht 5' 3.5" (1.613 m)   Wt 88.9 kg   SpO2 100%   BMI 34.18 kg/m   Physical Exam Vitals and nursing note reviewed.  Constitutional:      General: She is not in acute distress.    Appearance: Normal appearance. She is well-developed.  HENT:     Head: Normocephalic and atraumatic.     Right Ear: Hearing normal.     Left Ear: Hearing normal.     Nose: Nose normal.  Eyes:     Conjunctiva/sclera: Conjunctivae normal.     Pupils: Pupils are equal, round, and reactive to light.  Cardiovascular:     Rate and Rhythm: Regular rhythm.     Heart sounds: S1 normal and S2 normal. No murmur heard.  No friction rub. No gallop.   Pulmonary:     Effort: Pulmonary effort is normal. No respiratory distress.     Breath sounds: Normal breath sounds.  Chest:     Chest wall: No tenderness.  Abdominal:     General: Bowel sounds are normal.     Palpations: Abdomen is soft.     Tenderness: There is no abdominal tenderness. There is no guarding or rebound. Negative signs include Murphy's sign and McBurney's sign.     Hernia: No hernia is present.  Musculoskeletal:        General: Normal range of motion.     Cervical back: Normal range of motion and neck supple.  Skin:    General: Skin is warm and dry.     Findings: No rash.  Neurological:     Mental Status: She is alert and oriented to person, place, and time.     GCS: GCS eye subscore is 4. GCS verbal subscore is 5. GCS motor subscore is 6.     Cranial Nerves: No cranial nerve deficit.     Sensory: No sensory deficit.     Coordination: Coordination normal.  Psychiatric:         Speech: Speech normal.        Behavior: Behavior normal.  Thought Content: Thought content normal.     ED Results / Procedures / Treatments   Labs (all labs ordered are listed, but only abnormal results are displayed) Labs Reviewed  SARS CORONAVIRUS 2 BY RT PCR (HOSPITAL ORDER, PERFORMED IN [facility]) - Abnormal; Notable for the following components:      Result Value   SARS Coronavirus 2 POSITIVE (*)    All other components within normal limits    EKG None  Radiology No results found.  Procedures Procedures (including critical care time)  Medications Ordered in ED Medications  predniSONE (DELTASONE) tablet 60 mg (60 mg Oral Given 06/14/20 0129)  acetaminophen (TYLENOL) tablet 1,000 mg (1,000 mg Oral Given 06/14/20 0129)  ibuprofen (ADVIL) tablet 800 mg (800 mg Oral Given 06/14/20 0129)    ED Course  I have reviewed the triage vital signs and the nursing notes.  Pertinent labs & imaging results that were available during my care of the patient were reviewed by me and considered in my medical decision making (see chart for details).    MDM Rules/Calculators/A&P                          Patient presents with Covid symptoms and did test positive.  She appears well, does not require hospitalization.  She does, however, have a history of asthma, will continue bronchodilator therapy and was prescribed prednisone.  She was given return precautions and quarantine instructions. Final Clinical Impression(s) / ED Diagnoses Final diagnoses:  COVID-19    Rx / DC Orders ED Discharge Orders         Ordered    benzonatate (TESSALON) 200 MG capsule  3 times daily PRN        06/14/20 0128    predniSONE (DELTASONE) 20 MG tablet  Daily with breakfast        06/14/20 0128           Gilda Crease, MD 06/15/20 [phone]

## 2020-06-21 ENCOUNTER — Ambulatory Visit: Payer: Self-pay | Admitting: *Deleted

## 2020-06-21 ENCOUNTER — Telehealth: Payer: Self-pay | Admitting: *Deleted

## 2020-06-21 NOTE — Telephone Encounter (Signed)
Pt's father calling. States pt tested positive for covid "And cannot keep anything down." Pt is not present. Advised would need to speak with pt. Two numbers provided, left message to CB at [phone] (Pt's husband Benson Norway who is with pt.)

## 2021-03-20 ENCOUNTER — Ambulatory Visit (INDEPENDENT_AMBULATORY_CARE_PROVIDER_SITE_OTHER): Payer: Medicaid Other | Admitting: Nurse Practitioner

## 2021-03-20 ENCOUNTER — Other Ambulatory Visit: Payer: Self-pay

## 2021-03-20 ENCOUNTER — Encounter: Payer: Self-pay | Admitting: Nurse Practitioner

## 2021-03-20 ENCOUNTER — Other Ambulatory Visit (HOSPITAL_COMMUNITY)
Admission: RE | Admit: 2021-03-20 | Discharge: 2021-03-20 | Disposition: A | Discharge status: Home / Self Care | Payer: Medicaid Other | Source: Ambulatory Visit | Attending: Nurse Practitioner | Admitting: Nurse Practitioner

## 2021-03-20 VITALS — BP 125/90 | HR 83 | Ht 63.5 in | Wt 193.0 lb

## 2021-03-20 DIAGNOSIS — B999 Unspecified infectious disease: Secondary | ICD-10-CM

## 2021-03-20 DIAGNOSIS — Z6833 Body mass index (BMI) 33.0-33.9, adult: Secondary | ICD-10-CM | POA: Diagnosis not present

## 2021-03-20 DIAGNOSIS — Z01419 Encounter for gynecological examination (general) (routine) without abnormal findings: Secondary | ICD-10-CM | POA: Diagnosis not present

## 2021-03-20 MED ORDER — SULFAMETHOXAZOLE-TRIMETHOPRIM 800-160 MG PO TABS: 1.0000 | ORAL_TABLET | Freq: Two times a day (BID) | ORAL | 0 refills | Status: DC

## 2021-03-20 NOTE — Progress Notes (Signed)
GYNECOLOGY ANNUAL PREVENTATIVE CARE ENCOUNTER NOTE  Subjective:   Monica Cox is a 30 y.o. [phone] female here for a routine annual gynecologic exam.  Current complaints: none.   Denies abnormal vaginal bleeding, discharge, pelvic pain, problems with intercourse or other gynecologic concerns.    Gynecologic History No LMP recorded. Patient has had an implant. Contraception: Nexplanon Last Pap: 03-19-16. Results were: normal   Obstetric History OB History  Gravida Para Term Preterm AB Living  5 4 4  0 1 4  SAB IAB Ectopic Multiple Live Births  1 0 0 0 4    # Outcome Date GA Lbr Len/2nd Weight Sex Delivery Anes PTL Lv  5 Term 01/11/18 [redacted]w[redacted]d 10:19 / 00:02 6 lb 15 oz (3.147 kg) F Vag-Spont Local, EPI  LIV  4 Term 10/05/16 [redacted]w[redacted]d / 00:20 8 lb 8.3 oz (3.864 kg) F Vag-Spont EPI  LIV  3 Term 11/19/14 [redacted]w[redacted]d 13:18 / 00:10 8 lb 9.4 oz (3.895 kg) F Vag-Spont EPI N LIV  2 Term 04/24/09 [redacted]w[redacted]d  7 lb 9 oz (3.43 kg) M Vag-Spont EPI N LIV     Birth Comments: immediate pp HTN  1 SAB             Past Medical History:  Diagnosis Date   Anemia    Asthma    inhaler as needed   Depression    GERD (gastroesophageal reflux disease)    H/O trichomonas 08/08/2014   2015 pregnancy    History of chlamydia 03/19/2016   2015 pregnancy    History of kidney stones    Hypertension    pp htn   Nausea 02/06/2016   Reflux    Smoker 02/06/2016   Trichomonas infection 08-08-2014   Vaginal Pap smear, abnormal    Vertigo 09/21/2014   antivert rx 09/21/2014      Past Surgical History:  Procedure Laterality Date   DILATION AND CURETTAGE OF UTERUS     [facility] Dr. 09/23/2014 performed   ESOPHAGOGASTRODUODENOSCOPY (EGD) WITH PROPOFOL N/A 09/23/2018   Procedure: ESOPHAGOGASTRODUODENOSCOPY (EGD) WITH PROPOFOL;  Surgeon: 09/25/2018, MD;  Location: [facility];  Service: Endoscopy;  Laterality: N/A;  8:45am   ESOPHAGOGASTRODUODENOSCOPY ENDOSCOPY      Current Outpatient Medications on File Prior to Visit   Medication Sig Dispense Refill   LORazepam (ATIVAN) 0.5 MG tablet Take 0.5 mg by mouth 2 (two) times daily.      No current facility-administered medications on file prior to visit.    No Known Allergies  Social History   Socioeconomic History   Marital status: Married    Spouse name: Not on file   Number of children: 1   Years of education: Not on file   Highest education level: Not on file  Occupational History   Occupation: [employer]    Employer: [employer]    Comment: respiratory therapy  Tobacco Use   Smoking status: Former    Pack years: 0.00    Types: Cigars    Quit date: 09/20/2016    Years since quitting: 4.4   Smokeless tobacco: Never  Vaping Use   Vaping Use: Never used  Substance and Sexual Activity   Alcohol use: Yes    Alcohol/week: 0.0 standard drinks    Comment: occasionally   Drug use: No   Sexual activity: Yes    Birth control/protection: Implant  Other Topics Concern   Not on file  Social History Narrative   Not on file   Social  Determinants of Health   Financial Resource Strain: Not on file  Food Insecurity: Not on file  Transportation Needs: Not on file  Physical Activity: Not on file  Stress: Not on file  Social Connections: Not on file  Intimate Partner Violence: Not on file    Family History  Problem Relation Age of Onset   Ulcers Father    Hypertension Father    Hypertension Mother    Ulcers Paternal Grandmother    Diabetes Paternal Grandmother    Ulcers Paternal Aunt    Liver disease Neg Hx    Colon cancer Neg Hx    Gastric cancer Neg Hx    Esophageal cancer Neg Hx     The following portions of the patient's history were reviewed and updated as appropriate: allergies, current medications, past family history, past medical history, past social history, past surgical history and problem list.  Review of Systems Pertinent items noted in HPI and remainder of comprehensive ROS otherwise negative.   Objective:  BP 125/90 (BP  Location: Left Arm, Patient Position: Sitting, Cuff Size: Normal)   Pulse 83   Ht 5' 3.5" (1.613 m)   Wt 193 lb (87.5 kg)   BMI 33.65 kg/m  CONSTITUTIONAL: Well-developed, well-nourished female in no acute distress.  HENT:  Normocephalic, atraumatic, External right and left ear normal.  EYES: Conjunctivae and EOM are normal. Pupils are equal, round.  No scleral icterus.  NECK: Normal range of motion, supple, no masses.  Normal thyroid.  SKIN: Skin is warm and dry. No rash noted. Not diaphoretic. No erythema. No pallor. NEUROLOGIC: Alert and oriented to person, place, and time. Normal reflexes, muscle tone coordination. No cranial nerve deficit noted. PSYCHIATRIC: Normal mood and affect. Normal behavior. Normal judgment and thought content. CARDIOVASCULAR: Normal heart rate noted, regular rhythm RESPIRATORY: Clear to auscultation bilaterally. Effort and breath sounds normal, no problems with respiration noted. BREASTS: Symmetric in size. No masses, skin changes, nipple drainage, or lymphadenopathy. ABDOMEN: Soft, no distention noted.  No tenderness, rebound or guarding.  PELVIC: Normal appearing external genitalia; normal appearing vaginal mucosa and cervix.  No abnormal discharge noted.  Pap smear obtained.  Normal uterine size, no other palpable masses, no uterine or adnexal tenderness. MUSCULOSKELETAL: Normal range of motion. No tenderness.  No cyanosis, clubbing, or edema.    Assessment and Plan:  1. Encounter for gynecological examination with Papanicolaou smear of cervix Annual exam today. Needs Nexplanon removed - expired recently in early June. Wants IUD and prefers Paragard but she does have 8 day menses.  Used Mirena before but "held onto 45 pounds postpartum until the Mirena was removed and then lost the weight in a very short amount of time. Given info on both IUDs and will consider Lolita Rieger or Liletta again. Will return for Nexplanon removal and IUD insertion.  - Cytology - PAP(  [facility])  2. BMI 33.0-33.9,adult Advised for her best health to be under BMI of 25  3. Infection Bilateral nipple piercings which still drain pus into her bra.  Requesting antibiotics as she had the piercings in August.  One 2 -3 mm closed pimple like area noted beside piercing on the right (inner aspect of the piercing). Will Give Bactrim for 3 days.  Will follow up results of pap smear and manage accordingly.Routine preventative health maintenance measures emphasized. Please refer to After Visit Summary for other counseling recommendations.    Nolene Bernheim, RN, MSN, NP-BC Nurse Practitioner, [facility] for [facility]

## 2021-03-22 LAB — CYTOLOGY - PAP
Chlamydia: NEGATIVE
Comment: NEGATIVE
Comment: NEGATIVE
Comment: NORMAL
Diagnosis: NEGATIVE
High risk HPV: NEGATIVE
Neisseria Gonorrhea: NEGATIVE

## 2021-04-04 ENCOUNTER — Encounter: Payer: Medicaid Other | Admitting: Adult Health

## 2021-04-16 ENCOUNTER — Encounter: Payer: Self-pay | Admitting: Adult Health

## 2021-04-16 ENCOUNTER — Other Ambulatory Visit: Payer: Self-pay

## 2021-04-16 ENCOUNTER — Ambulatory Visit (INDEPENDENT_AMBULATORY_CARE_PROVIDER_SITE_OTHER): Payer: Medicaid Other | Admitting: Adult Health

## 2021-04-16 VITALS — BP 127/88 | HR 96 | Ht 63.2 in | Wt 193.0 lb

## 2021-04-16 DIAGNOSIS — Z30011 Encounter for initial prescription of contraceptive pills: Secondary | ICD-10-CM

## 2021-04-16 DIAGNOSIS — Z3202 Encounter for pregnancy test, result negative: Secondary | ICD-10-CM

## 2021-04-16 DIAGNOSIS — Z3046 Encounter for surveillance of implantable subdermal contraceptive: Secondary | ICD-10-CM | POA: Diagnosis not present

## 2021-04-16 LAB — POCT URINE PREGNANCY: Preg Test, Ur: NEGATIVE

## 2021-04-16 MED ORDER — LO LOESTRIN FE 1 MG-10 MCG / 10 MCG PO TABS: 1.0000 | ORAL_TABLET | Freq: Every day | ORAL | 11 refills | Status: DC

## 2021-04-16 NOTE — Patient Instructions (Signed)
Use condoms x 4 weeks, keep clean and dry x 24 hours, no heavy lifting, keep steri strips on x 72 hours, Keep pressure dressing on x 24 hours. Follow up prn problems.  

## 2021-04-16 NOTE — Progress Notes (Signed)
  Subjective:     Patient ID: Monica Cox, female   DOB: 1990-10-07, 30 y.o.   MRN: [id number]  HPI Monica Cox is a 30 year old black female,married, G5P4014 in for nexplanon removal, it has expired, and IUD insertion, but she had sex last night. Lab Results  Component Value Date   DIAGPAP  03/20/2021    - Negative for intraepithelial lesion or malignancy (NILM)   HPVHIGH Negative 03/20/2021    PCP is Dillard Essex NP.  Review of Systems For nexplanon removal Had sex last night with condom. Reviewed past medical,surgical, social and family history. Reviewed medications and allergies.     Objective:   Physical Exam BP 127/88 (BP Location: Left Arm, Patient Position: Sitting, Cuff Size: Normal)   Pulse 96   Ht 5' 3.2" (1.605 m)   Wt 193 lb (87.5 kg)   LMP 03/08/2021   BMI 33.97 kg/m  UPT is negative. Consent signed and time out called. Left arm cleansed with betadine, and injected with 1.5 cc 2% lidocaine and waited til numb.Under sterile technique a #11 blade was used to make small vertical incision, and a curved forceps was used to easily remove rod. Steri strips applied. Pressure dressing applied.    Fall risk is low  Upstream - 04/16/21 1631       Pregnancy Intention Screening   Does the patient want to become pregnant in the next year? No    Does the patient's partner want to become pregnant in the next year? No    Would the patient like to discuss contraceptive options today? No      Contraception Wrap Up   Current Method Hormonal Implant    End Method Oral Contraceptive    Contraception Counseling Provided Yes             Assessment:     1. Pregnancy test negative   2. Encounter for Nexplanon removal Use condoms x 4 weeks, keep clean and dry x 24 hours, no heavy lifting, keep steri strips on x 72 hours, Keep pressure dressing on x 24 hours. Follow up prn problems.  Offered IUD in 2 weeks, no sex and she wants a pill.  3. Encounter for initial  prescription of contraceptive pills Given 1 pack Lo Loestrin can start today,use condoms for 1 pack    Meds ordered this encounter  Medications   Norethindrone-Ethinyl Estradiol-Fe Biphas (LO LOESTRIN FE) 1 MG-10 MCG / 10 MCG tablet    Sig: Take 1 tablet by mouth daily. Take 1 daily by mouth    Dispense:  28 tablet    Refill:  11    BIN [id number], PCN [id number], GRP [id number]    Order Specific Question:   Supervising Provider    Answer:   Lazaro Arms [[id number]]    Plan:     Follow up prn or if wants IUD

## 2021-08-20 ENCOUNTER — Ambulatory Visit
Admission: EM | Admit: 2021-08-20 | Discharge: 2021-08-20 | Disposition: A | Discharge status: Home / Self Care | Payer: Medicaid Other | Attending: Family Medicine | Admitting: Family Medicine

## 2021-08-20 ENCOUNTER — Other Ambulatory Visit: Payer: Self-pay

## 2021-08-20 DIAGNOSIS — Z1152 Encounter for screening for COVID-19: Secondary | ICD-10-CM

## 2021-08-20 DIAGNOSIS — J4521 Mild intermittent asthma with (acute) exacerbation: Secondary | ICD-10-CM

## 2021-08-20 DIAGNOSIS — R062 Wheezing: Secondary | ICD-10-CM

## 2021-08-20 DIAGNOSIS — J069 Acute upper respiratory infection, unspecified: Secondary | ICD-10-CM

## 2021-08-20 LAB — POCT URINE PREGNANCY: Preg Test, Ur: NEGATIVE

## 2021-08-20 MED ORDER — PROMETHAZINE-DM 6.25-15 MG/5ML PO SYRP: 5.0000 mL | ORAL_SOLUTION | Freq: Four times a day (QID) | ORAL | 0 refills | Status: DC | PRN

## 2021-08-20 MED ORDER — PREDNISONE 20 MG PO TABS: 40.0000 mg | ORAL_TABLET | Freq: Every day | ORAL | 0 refills | Status: DC

## 2021-08-20 NOTE — ED Provider Notes (Signed)
[facility]    CSN: [id number] Arrival date & time: 08/20/21  2707      History   Chief Complaint Chief Complaint  Patient presents with   Headache   Sore Throat    HPI Monica Cox is a 30 y.o. female.   Patient presenting today with 1 day history of sore throat, fever, chills, body aches, headache, cough, chest tightness, wheezing.  Denies chest pain, significant shortness of breath, abdominal pain, nausea vomiting or diarrhea.  So far taking her albuterol nebulizer treatments with minimal temporary relief but not taking any other medications for symptoms.  Multiple recent sick contacts with influenza.  History of seasonal allergies, asthma on albuterol as needed.   Past Medical History:  Diagnosis Date   Anemia    Asthma    inhaler as needed   Depression    GERD (gastroesophageal reflux disease)    H/O trichomonas 08/08/2014   2015 pregnancy    History of chlamydia 03/19/2016   2015 pregnancy    History of kidney stones    Hypertension    pp htn   Nausea 02/06/2016   Reflux    Smoker 02/06/2016   Trichomonas infection 08-08-2014   Vaginal Pap smear, abnormal    Vertigo 09/21/2014   antivert rx 09/21/2014      Patient Active Problem List   Diagnosis Date Noted   GERD (gastroesophageal reflux disease) 08/26/2018   Hematemesis 08/26/2018   Constipation 08/26/2018   Nexplanon insertion 03/17/2018   Flu-like symptoms 09/07/2017   Depression, postpartum 10/11/2016    Class: Acute   Marijuana use 03/24/2016   Depression with anxiety 03/19/2016   Smoker 02/06/2016   Pre-eclampsia in postpartum period 11/26/2014   Heart murmur 09/21/2014    Past Surgical History:  Procedure Laterality Date   DILATION AND CURETTAGE OF UTERUS     [facility] Dr. Despina Hidden performed   ESOPHAGOGASTRODUODENOSCOPY (EGD) WITH PROPOFOL N/A 09/23/2018   Procedure: ESOPHAGOGASTRODUODENOSCOPY (EGD) WITH PROPOFOL;  Surgeon: Corbin Ade, MD;  Location: [facility];  Service:  Endoscopy;  Laterality: N/A;  8:45am   ESOPHAGOGASTRODUODENOSCOPY ENDOSCOPY      OB History     Gravida  5   Para  4   Term  4   Preterm  0   AB  1   Living  4      SAB  1   IAB  0   Ectopic  0   Multiple  0   Live Births  4            Home Medications    Prior to Admission medications   Medication Sig Start Date End Date Taking? Authorizing Provider  predniSONE (DELTASONE) 20 MG tablet Take 2 tablets (40 mg total) by mouth daily with breakfast. 08/20/21  Yes Particia Nearing, PA-C  promethazine-dextromethorphan (PROMETHAZINE-DM) 6.25-15 MG/5ML syrup Take 5 mLs by mouth 4 (four) times daily as needed for cough. 08/20/21  Yes Particia Nearing, PA-C  LORazepam (ATIVAN) 0.5 MG tablet Take 0.5 mg by mouth 2 (two) times daily.  11/29/19   [provider]  Norethindrone-Ethinyl Estradiol-Fe Biphas (LO LOESTRIN FE) 1 MG-10 MCG / 10 MCG tablet Take 1 tablet by mouth daily. Take 1 daily by mouth 04/16/21   Adline Potter, NP  sulfamethoxazole-trimethoprim (BACTRIM DS) 800-160 MG tablet Take 1 tablet by mouth 2 (two) times daily. Patient not taking: Reported on 04/16/2021 03/20/21   Currie Paris, NP    Family History  Family History  Problem Relation Age of Onset   Ulcers Father    Hypertension Father    Hypertension Mother    Ulcers Paternal Grandmother    Diabetes Paternal Grandmother    Ulcers Paternal Aunt    Liver disease Neg Hx    Colon cancer Neg Hx    Gastric cancer Neg Hx    Esophageal cancer Neg Hx     Social History Social History   Tobacco Use   Smoking status: Former    Types: Cigars    Quit date: 09/20/2016    Years since quitting: 4.9   Smokeless tobacco: Never  Vaping Use   Vaping Use: Never used  Substance Use Topics   Alcohol use: Not Currently    Comment: occasionally   Drug use: No     Allergies   Sulfa antibiotics   Review of Systems Review of Systems Per HPI  Physical Exam Triage Vital  Signs ED Triage Vitals  Enc Vitals Group     BP 08/20/21 1144 (!) 132/93     Pulse Rate 08/20/21 1144 (!) 103     Resp 08/20/21 1144 20     Temp 08/20/21 1144 99.5 F (37.5 C)     Temp src --      SpO2 08/20/21 1144 95 %     Weight --      Height --      Head Circumference --      Peak Flow --      Pain Score 08/20/21 1141 9     Pain Loc --      Pain Edu? --      Excl. in GC? --    No data found.  Updated Vital Signs BP (!) 132/93   Pulse (!) 103   Temp 99.5 F (37.5 C)   Resp 20   LMP 07/20/2021 (Approximate)   SpO2 95%   Visual Acuity Right Eye Distance:   Left Eye Distance:   Bilateral Distance:    Right Eye Near:   Left Eye Near:    Bilateral Near:     Physical Exam Vitals and nursing note reviewed.  Constitutional:      Appearance: Normal appearance.  HENT:     Head: Atraumatic.     Right Ear: Tympanic membrane and external ear normal.     Left Ear: Tympanic membrane and external ear normal.     Nose: Rhinorrhea present.     Mouth/Throat:     Mouth: Mucous membranes are moist.     Pharynx: Posterior oropharyngeal erythema present.  Eyes:     Extraocular Movements: Extraocular movements intact.     Conjunctiva/sclera: Conjunctivae normal.  Cardiovascular:     Rate and Rhythm: Normal rate and regular rhythm.     Heart sounds: Normal heart sounds.  Pulmonary:     Effort: Pulmonary effort is normal.     Breath sounds: Wheezing present. No rales.     Comments: Minimal diffuse wheezes. Musculoskeletal:        General: Normal range of motion.     Cervical back: Normal range of motion and neck supple.  Skin:    General: Skin is warm and dry.  Neurological:     Mental Status: She is alert and oriented to person, place, and time.  Psychiatric:        Mood and Affect: Mood normal.        Thought Content: Thought content normal.   UC Treatments / Results  Labs (  all labs ordered are listed, but only abnormal results are displayed) Labs Reviewed   COVID-19, FLU A+B NAA  POCT URINE PREGNANCY    EKG   Radiology No results found.  Procedures Procedures (including critical care time)  Medications Ordered in UC Medications - No data to display  Initial Impression / Assessment and Plan / UC Course  I have reviewed the triage vital signs and the nursing notes.  Pertinent labs & imaging results that were available during my care of the patient were reviewed by me and considered in my medical decision making (see chart for details).     Mildly tachycardic and low-grade fever in triage, otherwise vital signs reassuring with oxygen saturation of 95% on room air.  She appears in no acute distress and is speaking in full sentences, breathing comfortably on room air.  Does have some wheezes on exam likely asthma exacerbation secondary to viral infection.  Will treat with short burst of prednisone, continued albuterol inhaler and nebulizer treatments, Phenergan DM for cough.  Work note given.  COVID and flu testing pending.  Return for acutely worsening symptoms.  Final Clinical Impressions(s) / UC Diagnoses   Final diagnoses:  Viral URI with cough  Wheezing  Mild intermittent asthma with acute exacerbation   Discharge Instructions   None    ED Prescriptions     Medication Sig Dispense Auth. Provider   predniSONE (DELTASONE) 20 MG tablet Take 2 tablets (40 mg total) by mouth daily with breakfast. 10 tablet Particia Nearing, PA-C   promethazine-dextromethorphan (PROMETHAZINE-DM) 6.25-15 MG/5ML syrup Take 5 mLs by mouth 4 (four) times daily as needed for cough. 100 mL Particia Nearing, [place]      PDMP not reviewed this encounter.   Particia Nearing, [place] 08/20/21 1310

## 2021-08-20 NOTE — ED Triage Notes (Signed)
Pt presents with sore throat , headache and body aches that began yesterday

## 2021-08-21 LAB — COVID-19, FLU A+B NAA
Influenza A, NAA: NOT DETECTED
Influenza B, NAA: NOT DETECTED
SARS-CoV-2, NAA: NOT DETECTED

## 2021-11-04 ENCOUNTER — Other Ambulatory Visit: Payer: Self-pay

## 2021-11-04 ENCOUNTER — Emergency Department (HOSPITAL_COMMUNITY): Payer: Medicaid Other

## 2021-11-04 ENCOUNTER — Emergency Department (HOSPITAL_COMMUNITY)
Admission: EM | Admit: 2021-11-04 | Discharge: 2021-11-04 | Disposition: A | Discharge status: Home / Self Care | Payer: Medicaid Other | Attending: Emergency Medicine | Admitting: Emergency Medicine

## 2021-11-04 ENCOUNTER — Encounter (HOSPITAL_COMMUNITY): Payer: Self-pay | Admitting: *Deleted

## 2021-11-04 DIAGNOSIS — R1030 Lower abdominal pain, unspecified: Secondary | ICD-10-CM | POA: Insufficient documentation

## 2021-11-04 DIAGNOSIS — R8289 Other abnormal findings on cytological and histological examination of urine: Secondary | ICD-10-CM | POA: Insufficient documentation

## 2021-11-04 DIAGNOSIS — N9489 Other specified conditions associated with female genital organs and menstrual cycle: Secondary | ICD-10-CM | POA: Diagnosis not present

## 2021-11-04 DIAGNOSIS — O210 Mild hyperemesis gravidarum: Secondary | ICD-10-CM | POA: Diagnosis present

## 2021-11-04 DIAGNOSIS — Z20822 Contact with and (suspected) exposure to covid-19: Secondary | ICD-10-CM | POA: Diagnosis not present

## 2021-11-04 DIAGNOSIS — O211 Hyperemesis gravidarum with metabolic disturbance: Secondary | ICD-10-CM | POA: Insufficient documentation

## 2021-11-04 DIAGNOSIS — O30041 Twin pregnancy, dichorionic/diamniotic, first trimester: Secondary | ICD-10-CM | POA: Diagnosis not present

## 2021-11-04 DIAGNOSIS — O26899 Other specified pregnancy related conditions, unspecified trimester: Secondary | ICD-10-CM

## 2021-11-04 DIAGNOSIS — Z3A01 Less than 8 weeks gestation of pregnancy: Secondary | ICD-10-CM | POA: Diagnosis not present

## 2021-11-04 DIAGNOSIS — R109 Unspecified abdominal pain: Secondary | ICD-10-CM

## 2021-11-04 LAB — URINALYSIS, ROUTINE W REFLEX MICROSCOPIC
Glucose, UA: 100 mg/dL — AB
Hgb urine dipstick: NEGATIVE
Ketones, ur: 40 mg/dL — AB
Leukocytes,Ua: NEGATIVE
Nitrite: NEGATIVE
Protein, ur: 30 mg/dL — AB
Specific Gravity, Urine: >1.03 — ABNORMAL HIGH (ref 1.005–1.030)
pH: 5.5 (ref 5.0–8.0)

## 2021-11-04 LAB — COMPREHENSIVE METABOLIC PANEL
ALT: 36 U/L (ref 0–44)
AST: 27 U/L (ref 15–41)
Albumin: 4.5 g/dL (ref 3.5–5.0)
Alkaline Phosphatase: 80 U/L (ref 38–126)
Anion gap: 10 (ref 5–15)
BUN: 8 mg/dL (ref 6–20)
CO2: 22 mmol/L (ref 22–32)
Calcium: 9.5 mg/dL (ref 8.9–10.3)
Chloride: 101 mmol/L (ref 98–111)
Creatinine, Ser: 0.59 mg/dL (ref 0.44–1.00)
GFR, Estimated: >60 mL/min (ref >60)
Glucose, Bld: 85 mg/dL (ref 70–99)
Potassium: 3.6 mmol/L (ref 3.5–5.1)
Sodium: 133 mmol/L — ABNORMAL LOW (ref 135–145)
Total Bilirubin: 1.2 mg/dL (ref 0.3–1.2)
Total Protein: 8.7 g/dL — ABNORMAL HIGH (ref 6.5–8.1)

## 2021-11-04 LAB — CBC WITH DIFFERENTIAL/PLATELET
Abs Immature Granulocytes: 0.04 10*3/uL (ref 0.00–0.07)
Basophils Absolute: 0 10*3/uL (ref 0.0–0.1)
Basophils Relative: 0 %
Eosinophils Absolute: 0 10*3/uL (ref 0.0–0.5)
Eosinophils Relative: 0 %
HCT: 39.6 % (ref 36.0–46.0)
Hemoglobin: 13.8 g/dL (ref 12.0–15.0)
Immature Granulocytes: 1 %
Lymphocytes Relative: 17 %
Lymphs Abs: 1.2 10*3/uL (ref 0.7–4.0)
MCH: 31.9 pg (ref 26.0–34.0)
MCHC: 34.8 g/dL (ref 30.0–36.0)
MCV: 91.5 fL (ref 80.0–100.0)
Monocytes Absolute: 0.5 10*3/uL (ref 0.1–1.0)
Monocytes Relative: 7 %
Neutro Abs: 5.4 10*3/uL (ref 1.7–7.7)
Neutrophils Relative %: 75 %
Platelets: 289 10*3/uL (ref 150–400)
RBC: 4.33 MIL/uL (ref 3.87–5.11)
RDW: 13.2 % (ref 11.5–15.5)
WBC: 7.1 10*3/uL (ref 4.0–10.5)
nRBC: 0 % (ref 0.0–0.2)

## 2021-11-04 LAB — URINALYSIS, MICROSCOPIC (REFLEX)

## 2021-11-04 LAB — HCG, QUANTITATIVE, PREGNANCY: hCG, Beta Chain, Quant, S: 128449 m[IU]/mL — ABNORMAL HIGH (ref <5)

## 2021-11-04 LAB — RESP PANEL BY RT-PCR (FLU A&B, COVID) ARPGX2
Influenza A by PCR: NEGATIVE
Influenza B by PCR: NEGATIVE
SARS Coronavirus 2 by RT PCR: NEGATIVE

## 2021-11-04 MED ORDER — LACTATED RINGERS IV BOLUS
1000.0000 mL | Freq: Once | INTRAVENOUS | Status: AC
  Administered 2021-11-04: 1000 mL via INTRAVENOUS

## 2021-11-04 MED ORDER — DOXYLAMINE-PYRIDOXINE 10-10 MG PO TBEC: 1.0000 | DELAYED_RELEASE_TABLET | Freq: Two times a day (BID) | ORAL | 0 refills | Status: DC

## 2021-11-04 MED ORDER — PROMETHAZINE HCL 25 MG/ML IJ SOLN
INTRAMUSCULAR | Status: AC
  Filled 2021-11-04: qty 1

## 2021-11-04 MED ORDER — PROMETHAZINE HCL 25 MG PO TABS: 25.0000 mg | ORAL_TABLET | Freq: Four times a day (QID) | ORAL | 0 refills | Status: DC | PRN

## 2021-11-04 MED ORDER — SODIUM CHLORIDE 0.9 % IV SOLN
25.0000 mg | Freq: Four times a day (QID) | INTRAVENOUS | Status: DC | PRN
  Administered 2021-11-04: 25 mg via INTRAVENOUS
  Filled 2021-11-04: qty 1

## 2021-11-04 NOTE — ED Triage Notes (Signed)
Pt's LMP was 12/17 and took home pregnancy test last Friday and was positive. This is pt's fifth pregnancy. Pt does not know how far along she is and no appt made yet. Pt with N/V with everything.

## 2021-11-04 NOTE — Consult Note (Signed)
° °  OB/GYN Telephone Consult  @DATE @  Monica Cox is a 31 y.o. [phone] currently at about 7 wks by LMP presenting to the ER. I was called for a consult regarding the care of this patient by [facility].    The provider had a clinical question about safe medications in the s/o nausea and vomiting in pregnancy.   The provider presented the following relevant clinical information:  Pt is a [birth year] at about 7 wks who presents with n/v in pregnancy with a history of hyperemesis in the past that is previously well controlled in prior pregnancies with phenergan.   I performed a chart review on the patient and reviewed available documentation.  BP 115/78 (BP Location: Left Arm)    Pulse 73    Temp 98.5 F (36.9 C)    Resp 16    Ht 5' 3.5" (1.613 m)    Wt 83.9 kg    LMP 09/21/2021    SpO2 100%    BMI 32.26 kg/m   Exam- performed by consulting provider   Recommendations:  - Discussed diclegis bid initially along with phenergan prn at this time. Discussed reglan is also often times next line but given her familiarity with phenergan this is reasonable and I would give this.  - Agreed with PA's plan of small frequent bland meals - Discussed if not concerned for ectopic, no need for 09/23/2021 at this time - this can be done in the office.  - Message sent to FT for short term follow up to establish OB care this week due to history of hyperemesis.   Thank you for this consult and if additional recommendations are needed please call [phone] for the OB/GYN attending on service at [facility].   I spent approximately 10 minutes directly consulting with the provider and verbally discussing this case. Additionally 5 minutes minutes was spent performing chart review and documentation.   [facility], MD Attending Obstetrician & Gynecologist, [facility] for [facility], [facility]

## 2021-11-04 NOTE — ED Provider Notes (Signed)
[facility] Provider Note   CSN: [id number] Arrival date & time: 11/04/21  1531     History Chief Complaint  Patient presents with   Emesis   Emesis During Pregnancy    Monica Cox is a 31 y.o. female G6P4 presents to the ED for evaluation of nausea and vomiting for the past 2 weeks. Additionally, she mentions 4 days of intermittent lower abdominal cramping without vaginal fluid leakage, vaginal bleeding, or abnormal vaginal discharge. She reports her LMP was 09/21/21 and had a positive pregnancy test a couple of weeks ago. She has not taken any prenatal vitamins or followed up with OBGYN. The patient reports that she has had hyperemesis in all of her pregnancy requiring phenergan. She reports that she has not been able to keep down any foods or fluids, but reports she has been voiding at least 4 times a day. Denies any medical history. Allergic to sulfa antibiotics. Denies any tobacco, EtOH, or illicit drug use.    Emesis Associated symptoms: abdominal pain   Associated symptoms: no chills, no cough, no fever and no sore throat       Home Medications Prior to Admission medications   Medication Sig Start Date End Date Taking? Authorizing Provider  LORazepam (ATIVAN) 0.5 MG tablet Take 0.5 mg by mouth 2 (two) times daily.  11/29/19   [provider]  Norethindrone-Ethinyl Estradiol-Fe Biphas (LO LOESTRIN FE) 1 MG-10 MCG / 10 MCG tablet Take 1 tablet by mouth daily. Take 1 daily by mouth 04/16/21   Estill Dooms, NP  predniSONE (DELTASONE) 20 MG tablet Take 2 tablets (40 mg total) by mouth daily with breakfast. 08/20/21   Volney American, PA-C  promethazine-dextromethorphan (PROMETHAZINE-DM) 6.25-15 MG/5ML syrup Take 5 mLs by mouth 4 (four) times daily as needed for cough. 08/20/21   Volney American, PA-C  sulfamethoxazole-trimethoprim (BACTRIM DS) 800-160 MG tablet Take 1 tablet by mouth 2 (two) times daily. Patient not taking:  Reported on 04/16/2021 03/20/21   Virginia Rochester, NP      Allergies    Sulfa antibiotics    Review of Systems   Review of Systems  Constitutional:  Negative for chills and fever.  HENT:  Negative for congestion, rhinorrhea and sore throat.   Respiratory:  Negative for cough and shortness of breath.   Cardiovascular:  Negative for chest pain and palpitations.  Gastrointestinal:  Positive for abdominal pain, nausea and vomiting.  Genitourinary:  Negative for difficulty urinating, dysuria, flank pain, frequency, hematuria, pelvic pain, urgency, vaginal bleeding, vaginal discharge and vaginal pain.  Musculoskeletal:  Negative for back pain.  Skin:  Negative for rash.  Neurological:  Negative for weakness and light-headedness.   Physical Exam Updated Vital Signs BP 118/78    Pulse 78    Temp 98.5 F (36.9 C)    Resp 20    Ht 5' 3.5" (1.613 m)    Wt 83.9 kg    LMP 09/21/2021    SpO2 100%    BMI 32.26 kg/m  Physical Exam Vitals and nursing note reviewed.  Constitutional:      General: She is not in acute distress.    Appearance: Normal appearance. She is not toxic-appearing.  HENT:     Head: Normocephalic and atraumatic.     Mouth/Throat:     Mouth: Mucous membranes are moist.     Pharynx: No oropharyngeal exudate or posterior oropharyngeal erythema.  Eyes:     General: No scleral icterus. Cardiovascular:  Rate and Rhythm: Normal rate and regular rhythm.  Pulmonary:     Effort: Pulmonary effort is normal. No respiratory distress.     Breath sounds: Normal breath sounds. No wheezing.  Abdominal:     General: Abdomen is flat. Bowel sounds are normal.     Palpations: Abdomen is soft.     Tenderness: There is no abdominal tenderness. There is no guarding or rebound.  Musculoskeletal:        General: No deformity.     Cervical back: Normal range of motion.  Skin:    General: Skin is warm and dry.  Neurological:     General: No focal deficit present.     Mental Status: She  is alert. Mental status is at baseline.    ED Results / Procedures / Treatments   Labs (all labs ordered are listed, but only abnormal results are displayed) Labs Reviewed  HCG, QUANTITATIVE, PREGNANCY - Abnormal; Notable for the following components:      Result Value   hCG, Beta Chain, Quant, S 128,449 (*)    All other components within normal limits  COMPREHENSIVE METABOLIC PANEL - Abnormal; Notable for the following components:   Sodium 133 (*)    Total Protein 8.7 (*)    All other components within normal limits  URINALYSIS, ROUTINE W REFLEX MICROSCOPIC - Abnormal; Notable for the following components:   Color, Urine STRAW (*)    APPearance HAZY (*)    Specific Gravity, Urine >1.030 (*)    Glucose, UA 100 (*)    Bilirubin Urine MODERATE (*)    Ketones, ur 40 (*)    Protein, ur 30 (*)    All other components within normal limits  URINALYSIS, MICROSCOPIC (REFLEX) - Abnormal; Notable for the following components:   Bacteria, UA MANY (*)    All other components within normal limits  RESP PANEL BY RT-PCR (FLU A&B, COVID) [id number]  CBC WITH DIFFERENTIAL/PLATELET    EKG None  Radiology US OB Comp Less 14 Wks  Result Date: 11/04/2021 CLINICAL DATA:  Vomiting with pregnancy.  Cramping. EXAM: TWIN OBSTETRICAL ULTRASOUND <14 WKS TECHNIQUE: Transabdominal ultrasound was performed for evaluation of the gestation as well as the maternal uterus and adnexal regions. COMPARISON:  None. FINDINGS: Number of IUPs:  2 Chorionicity/Amnionicity:  Dichorionic/diamniotic. TWIN 1/A Yolk sac:  Visualized. Embryo:  Visualized. Cardiac Activity: Visualized. Heart Rate: 126 bpm MSD: 16.7 mm   6 w   4 d CRL:   7.2 mm   6 w 4 d                  [place] EDC: 06/26/2022 TWIN 2/B Yolk sac:  Visualized. Embryo:  Visualized. Cardiac Activity: Visualized. Heart Rate: 122 bpm MSD: 22.3 mm   7 w   1 d CRL:   10.4 mm   7 w 1 d                  [place] EDC: 06/17/2022 Subchorionic hemorrhage:  None visualized. Maternal  uterus/adnexae: Bilateral ovaries visualized and appear within normal limits. No free fluid. IMPRESSION: 1. Live intrauterine twin pregnancy, dichorionic diamniotic. 2. Twin A measures 6 weeks 4 days.  Twin B measures 7 weeks 1 day. 3. No acute maternal abnormality. Electronically Signed   By: Ronney Asters M.D.   On: 11/04/2021 19:39    Procedures Procedures   Medications Ordered in ED Medications  promethazine (PHENERGAN) 25 mg in sodium chloride 0.9 % 50 mL IVPB (0 mg Intravenous  Stopped 11/04/21 2104)  lactated ringers bolus 1,000 mL (0 mLs Intravenous Stopped 11/04/21 1809)  lactated ringers bolus 1,000 mL (0 mLs Intravenous Stopped 11/04/21 1940)    ED Course/ Medical Decision Making/ A&P                           Medical Decision Making Amount and/or Complexity of Data Reviewed Labs: ordered. Radiology: ordered.  Risk Prescription drug management.   31 y/o F presents to the ED for evaluation of vomiting in pregnancy with mild abdominal cramping. Differential diagnosis includes but is not limited to hyperemesis gravidum, electrolyte abnormality, dehydration, miscarriage, ectopic. The patient reports that she has had this nausea and vomiting throughout her other pregnancies as well and usually requires phenergan. Vital signs stable. Physical exam unremarkable. MMM, no abdominal tenderness, no skin tenting. The patient did not have any emesis while in the ED.  Labs and [place] ordered.  I independently reviewed and interpreted the patient's labs and imaging. CBC shows no leukocytosis or anemia. CMP shows mildly decrease in sodium, otherwise no other electrolyte abnormality. Negative for COVID and flu. Urinalysis shows concentrated urine with some glucose, protein, and ketones. Many bacteria seen on reflex, but there is also 11-20 squamous cells presents and she was negative for nitrites and leukocytes. Likely dirty catch as the patient is also not experiencing any symptoms. bHCG [id number]. I  agree with the radiologist's interpretation of di-di twins without maternal abnormality.  I consulted OBGYN for their recommendations given the new FDA data on anti-emetics in pregnancy. I spoke to Dr. Radene Gunning, OBGYN regarding this patient. She recommended diclegis bid daily and phenergan as needed. She recommended zofran if the patient was refractory to phenergan.   With the patient's labs and normal imaging, she is likely experiencing hyperemesis gravidum with some dehydration given her urine.  The patient received 2L of LR and phenergan while here and did not have any emesis before and after the PO Challenge.    On re-evaluation, the patient reports she is feeling much better and would like to go home. I discussed with her the lab and imagine findings. Recommended 5 small meals a day instead of 3, ginger supplementation, and drinking plenty of fluids along with taking her prenatal vitamins. Dr. Radene Gunning messaged the patient's previous OB provider about the patient's new pregnancy and vomiting and requested that the patient follow up soon with her provider. Return precautions discussed. Patient agrees to plan. The patient is stable and being discharged home in good condition.    Final Clinical Impression(s) / ED Diagnoses Final diagnoses:  Hyperemesis gravidarum  Dichorionic diamniotic twin pregnancy in first trimester    Rx / DC Orders ED Discharge Orders          Ordered    Doxylamine-Pyridoxine (DICLEGIS) 10-10 MG TBEC  2 times daily        11/04/21 2141    promethazine (PHENERGAN) 25 MG tablet  Every 6 hours PRN        11/04/21 2141              Sherrell Puller, PA-C 11/06/21 1046    Godfrey Pick, MD 11/08/21 1826

## 2021-11-04 NOTE — ED Notes (Signed)
Pt tolerating po fluids well without nausea or vomiting. 

## 2021-11-04 NOTE — Discharge Instructions (Addendum)
You were seen here today for evaluation of your nausea and vomiting.  Your lab results were reassuring although your urine did show you were dehydrated some.  You were given plenty of fluids here as well as nausea medication.  I discussed your case with our on-call OB/GYN who recommended the Diclegis to take twice daily and to use the Phenergan only as needed for nausea and vomiting.  As discussed previously, it is important to try 5 small meals a day in comparison to 3 meals to lessen your nausea and chance for vomiting.  Additionally, you can try things such as GinGins or ginger mints to help with nausea in conjunction with your other medications.  Make sure to stay well-hydrated with plenty of fluids, mainly water.  Additionally, make sure that you are taking a pregnancy multivitamin daily.  Please follow-up with your OB/GYN that she was on previously with your other pregnancies.  If you are not interested in them, I have attached information on the [place] [facility].  If you have any vaginal bleeding, vaginal leakage of fluid, abnormal vaginal discharge, worsening abdominal pain, nausea vomiting uncontrolled your medications, please return to the nearest emergency department for evaluation.

## 2021-11-28 ENCOUNTER — Encounter: Payer: Self-pay | Admitting: *Deleted

## 2021-11-28 DIAGNOSIS — Z6791 Unspecified blood type, Rh negative: Secondary | ICD-10-CM | POA: Insufficient documentation

## 2021-11-28 DIAGNOSIS — O30049 Twin pregnancy, dichorionic/diamniotic, unspecified trimester: Secondary | ICD-10-CM | POA: Insufficient documentation

## 2021-11-28 DIAGNOSIS — O09299 Supervision of pregnancy with other poor reproductive or obstetric history, unspecified trimester: Secondary | ICD-10-CM | POA: Insufficient documentation

## 2021-11-28 DIAGNOSIS — O0990 Supervision of high risk pregnancy, unspecified, unspecified trimester: Secondary | ICD-10-CM | POA: Insufficient documentation

## 2021-11-28 DIAGNOSIS — O26899 Other specified pregnancy related conditions, unspecified trimester: Secondary | ICD-10-CM | POA: Insufficient documentation

## 2021-12-02 ENCOUNTER — Emergency Department (HOSPITAL_COMMUNITY)
Admission: EM | Admit: 2021-12-02 | Discharge: 2021-12-03 | Disposition: A | Discharge status: Home / Self Care | Payer: Medicaid Other | Attending: Emergency Medicine | Admitting: Emergency Medicine

## 2021-12-02 ENCOUNTER — Other Ambulatory Visit: Payer: Self-pay

## 2021-12-02 ENCOUNTER — Emergency Department (HOSPITAL_COMMUNITY): Payer: Medicaid Other

## 2021-12-02 DIAGNOSIS — N9489 Other specified conditions associated with female genital organs and menstrual cycle: Secondary | ICD-10-CM | POA: Diagnosis not present

## 2021-12-02 DIAGNOSIS — O209 Hemorrhage in early pregnancy, unspecified: Secondary | ICD-10-CM | POA: Diagnosis not present

## 2021-12-02 DIAGNOSIS — F419 Anxiety disorder, unspecified: Secondary | ICD-10-CM | POA: Diagnosis not present

## 2021-12-02 DIAGNOSIS — Z3A11 11 weeks gestation of pregnancy: Secondary | ICD-10-CM | POA: Diagnosis not present

## 2021-12-02 DIAGNOSIS — O200 Threatened abortion: Secondary | ICD-10-CM

## 2021-12-02 DIAGNOSIS — O208 Other hemorrhage in early pregnancy: Secondary | ICD-10-CM | POA: Diagnosis not present

## 2021-12-02 DIAGNOSIS — O30041 Twin pregnancy, dichorionic/diamniotic, first trimester: Secondary | ICD-10-CM | POA: Diagnosis not present

## 2021-12-02 DIAGNOSIS — O4690 Antepartum hemorrhage, unspecified, unspecified trimester: Secondary | ICD-10-CM

## 2021-12-02 LAB — HCG, QUANTITATIVE, PREGNANCY: hCG, Beta Chain, Quant, S: 173393 m[IU]/mL — ABNORMAL HIGH (ref <5)

## 2021-12-02 NOTE — ED Provider Notes (Signed)
[facility] EMERGENCY DEPARTMENT Provider Note   CSN: [id number] Arrival date & time: 12/02/21  1910     History  Chief Complaint  Patient presents with   Vaginal Bleeding    Monica Cox is a 31 y.o. female around [redacted] weeks pregnant presenting today with vaginal bleeding.  She reports that this started just prior to arrival.  During her emergency department wait she passed a blood clot in the toilet.  Denies any dizziness, palpitations or shortness of breath.  Reports feeling very anxious.  History of miscarriage.  She is unable to see OB/GYN until 3/6.  Denies any abdominal pain but states lower abdominal cramping.  Home Medications Prior to Admission medications   Medication Sig Start Date End Date Taking? Authorizing Provider  Doxylamine-Pyridoxine (DICLEGIS) 10-10 MG TBEC Take 1 tablet by mouth 2 (two) times daily. 11/04/21   Achille Rich, PA-C  LORazepam (ATIVAN) 0.5 MG tablet Take 0.5 mg by mouth 2 (two) times daily.  11/29/19   [provider]  Norethindrone-Ethinyl Estradiol-Fe Biphas (LO LOESTRIN FE) 1 MG-10 MCG / 10 MCG tablet Take 1 tablet by mouth daily. Take 1 daily by mouth 04/16/21   Adline Potter, NP  predniSONE (DELTASONE) 20 MG tablet Take 2 tablets (40 mg total) by mouth daily with breakfast. 08/20/21   Particia Nearing, PA-C  promethazine (PHENERGAN) 25 MG tablet Take 1 tablet (25 mg total) by mouth every 6 (six) hours as needed for nausea or vomiting. 11/04/21   Achille Rich, PA-C  promethazine-dextromethorphan (PROMETHAZINE-DM) 6.25-15 MG/5ML syrup Take 5 mLs by mouth 4 (four) times daily as needed for cough. 08/20/21   Particia Nearing, PA-C  sulfamethoxazole-trimethoprim (BACTRIM DS) 800-160 MG tablet Take 1 tablet by mouth 2 (two) times daily. Patient not taking: Reported on 04/16/2021 03/20/21   Currie Paris, NP      Allergies    Sulfa antibiotics    Review of Systems   Review of Systems  Genitourinary:  Positive for  vaginal bleeding.  See HPI  Physical Exam Updated Vital Signs BP 111/77    Pulse 87    Temp 98.9 F (37.2 C) (Oral)    Resp 18    Ht 5\' 3"  (1.6 m)    Wt 83.9 kg    LMP 09/21/2021    SpO2 100%    BMI 32.77 kg/m  Physical Exam Vitals and nursing note reviewed.  Constitutional:      Appearance: Normal appearance.  HENT:     Head: Normocephalic and atraumatic.  Eyes:     General: No scleral icterus.    Conjunctiva/sclera: Conjunctivae normal.  Pulmonary:     Effort: Pulmonary effort is normal. No respiratory distress.  Abdominal:     General: Abdomen is flat.     Palpations: Abdomen is soft.  Genitourinary:    Comments: Pelvic exam performed in presence of nurse tech.  Moderate amount of blood in vaginal vault, cervix partially dilated Skin:    Findings: No rash.  Neurological:     Mental Status: She is alert.  Psychiatric:        Mood and Affect: Mood normal.    ED Results / Procedures / Treatments   Labs (all labs ordered are listed, but only abnormal results are displayed) Labs Reviewed  HCG, QUANTITATIVE, PREGNANCY - Abnormal; Notable for the following components:      Result Value   hCG, Beta Chain, Quant, S 09/23/2021 (*)    All other components within normal limits  EKG None  Radiology No results found.  Procedures Procedures   Medications Ordered in ED Medications - No data to display  ED Course/ Medical Decision Making/ A&P                           Medical Decision Making Amount and/or Complexity of Data Reviewed Labs: ordered. Radiology: ordered.   31 year old female presenting with vaginal bleeding during pregnancy.  Started today.  History of miscarriage.  Physical exam: Patient with mild suprapubic tenderness.  Pelvic exam performed with tech, pelvic slightly dilated.  Patient was able to show me a photo of a quarter sized clot that she passed in the toilet while in the department.  Imaging: I was informed by charge that there would not be  18 to do ultrasound this evening unless we were ruling out torsion or an ectopic pregnancy.  While preparing to discharge the patient, ultrasound came and got her to take her to the scan.  Ultrasound pending at this time.  Patient signed out to Dr. Judd Lien at shift change.  Please see his note for ultimate results and disposition  Final Clinical Impression(s) / ED Diagnoses Final diagnoses:  Vaginal bleeding in pregnancy       Saddie Benders, PA-C 12/02/21 2320    Geoffery Lyons, MD 12/03/21 [id number]

## 2021-12-02 NOTE — ED Triage Notes (Signed)
Pov from home with cc of vaginal bleeding that started appx 1 hour ago. Monica Cox it is like she is on her period. Appx 10is weeks. Goes to [facility] march 6. [facility] is [facility]. Denies any pain

## 2021-12-02 NOTE — Discharge Instructions (Addendum)
No heavy lifting or physically strenuous activity until cleared by your OB/GYN.  Vaginal rest as discussed until cleared by your OB/GYN.  You should follow-up with your OB in the next 2 to 3 days for a recheck.  Return to the emergency department if you develop any new and/or concerning symptoms.

## 2021-12-02 NOTE — ED Notes (Signed)
To US

## 2021-12-03 ENCOUNTER — Encounter: Payer: Self-pay | Admitting: Obstetrics & Gynecology

## 2021-12-03 ENCOUNTER — Other Ambulatory Visit (HOSPITAL_COMMUNITY): Payer: Self-pay | Admitting: Emergency Medicine

## 2021-12-03 ENCOUNTER — Ambulatory Visit (INDEPENDENT_AMBULATORY_CARE_PROVIDER_SITE_OTHER): Payer: Self-pay | Admitting: Obstetrics & Gynecology

## 2021-12-03 VITALS — BP 102/73 | HR 94 | Wt 187.0 lb

## 2021-12-03 DIAGNOSIS — O30041 Twin pregnancy, dichorionic/diamniotic, first trimester: Secondary | ICD-10-CM | POA: Diagnosis not present

## 2021-12-03 DIAGNOSIS — O208 Other hemorrhage in early pregnancy: Secondary | ICD-10-CM | POA: Diagnosis not present

## 2021-12-03 DIAGNOSIS — Z3A10 10 weeks gestation of pregnancy: Secondary | ICD-10-CM

## 2021-12-03 DIAGNOSIS — O418X11 Other specified disorders of amniotic fluid and membranes, first trimester, fetus 1: Secondary | ICD-10-CM

## 2021-12-03 DIAGNOSIS — O468X1 Other antepartum hemorrhage, first trimester: Secondary | ICD-10-CM

## 2021-12-03 DIAGNOSIS — Z3A11 11 weeks gestation of pregnancy: Secondary | ICD-10-CM | POA: Diagnosis not present

## 2021-12-03 DIAGNOSIS — O4690 Antepartum hemorrhage, unspecified, unspecified trimester: Secondary | ICD-10-CM

## 2021-12-03 NOTE — Progress Notes (Signed)
Follow up appointment for results  Chief Complaint  Patient presents with   w/i for ER f/u bleeding    No bleeding currently    Blood pressure 102/73, pulse 94, weight 187 lb (84.8 kg), last menstrual period 09/21/2021.  US OB Comp AddL Gest Less 14 Wks  Result Date: 12/03/2021 CLINICAL DATA:  Twin pregnancy. Assigned gestational age [redacted] weeks, 4 days. Vaginal bleeding. EXAM: TWIN OBSTETRIC <14WK [place] AND TRANSVAGINAL OB US TECHNIQUE: Both transabdominal and transvaginal ultrasound examinations were performed for complete evaluation of the gestation as well as the maternal uterus, adnexal regions, and pelvic cul-de-sac. Transvaginal technique was performed to assess early pregnancy. COMPARISON:  None. FINDINGS: Number of IUPs:  2 Chorionicity/Amnionicity:  Dichorionic-diamniotic (thick membrane) TWIN 1 Yolk sac:  Not visualized Embryo:  Present, single Cardiac Activity: Present, regular Heart Rate: 162 bpm MSD: Appropriate given fetal size CRL:  45 mm   11 w 2 d                  [place] EDC: 06/21/2022 TWIN 2 Yolk sac:  Present, single, normal appearing Embryo:  Present, single Cardiac Activity: Present, regular Heart Rate: 166 bpm MSD: Appropriate given fetal size CRL:  42 mm   11 w 1 d                  [place] EDC: 06/22/2022 Subchorionic hemorrhage: Small subchorionic hemorrhage noted, best appreciated anteriorly on transvaginal sonography Maternal uterus/adnexae: The uterus is anteverted. No intrauterine masses are seen. The cervix is closed and is unremarkable. The maternal ovaries are unremarkable. IMPRESSION: Living dichorionic diamniotic twin gestation with appropriate interval growth since prior examination. EDC (based on examination of 11/04/2021) of 06/26/2022. Small subchorionic hemorrhage. Electronically Signed   By: Helyn Numbers M.D.   On: 12/03/2021 09:09   US OB LESS THAN 14 WEEKS WITH OB TRANSVAGINAL  Result Date: 12/03/2021 CLINICAL DATA:  Twin pregnancy. Assigned gestational age [redacted] weeks, 4  days. Vaginal bleeding. EXAM: TWIN OBSTETRIC <14WK [place] AND TRANSVAGINAL OB US TECHNIQUE: Both transabdominal and transvaginal ultrasound examinations were performed for complete evaluation of the gestation as well as the maternal uterus, adnexal regions, and pelvic cul-de-sac. Transvaginal technique was performed to assess early pregnancy. COMPARISON:  None. FINDINGS: Number of IUPs:  2 Chorionicity/Amnionicity:  Dichorionic-diamniotic (thick membrane) TWIN 1 Yolk sac:  Not visualized Embryo:  Present, single Cardiac Activity: Present, regular Heart Rate: 162 bpm MSD: Appropriate given fetal size CRL:  45 mm   11 w 2 d                  [place] EDC: 06/21/2022 TWIN 2 Yolk sac:  Present, single, normal appearing Embryo:  Present, single Cardiac Activity: Present, regular Heart Rate: 166 bpm MSD: Appropriate given fetal size CRL:  42 mm   11 w 1 d                  [place] EDC: 06/22/2022 Subchorionic hemorrhage: Small subchorionic hemorrhage noted, best appreciated anteriorly on transvaginal sonography Maternal uterus/adnexae: The uterus is anteverted. No intrauterine masses are seen. The cervix is closed and is unremarkable. The maternal ovaries are unremarkable. IMPRESSION: Living dichorionic diamniotic twin gestation with appropriate interval growth since prior examination. EDC (based on examination of 11/04/2021) of 06/26/2022. Small subchorionic hemorrhage. Electronically Signed   By: Helyn Numbers M.D.   On: 12/03/2021 00:57      MEDS ordered this encounter: No orders of the defined types were placed in this encounter.  Orders for this encounter: No orders of the defined types were placed in this encounter.   Impression:   ICD-10-CM   1. Subchorionic hemorrhage of placenta in first trimester  O41.8X11    O46.8X1        Plan: Keep scheduled appt Monday for labs etc  Follow Up: Return for keep scheduled.      All questions were answered.  Past Medical History:  Diagnosis Date   Anemia     Asthma    inhaler as needed   Depression    GERD (gastroesophageal reflux disease)    H/O trichomonas 08/08/2014   2015 pregnancy    History of chlamydia 03/19/2016   2015 pregnancy    History of kidney stones    Hypertension    pp htn   Nausea 02/06/2016   Reflux    Smoker 02/06/2016   Trichomonas infection 08-08-2014   Vaginal Pap smear, abnormal    Vertigo 09/21/2014   antivert rx 09/21/2014      Past Surgical History:  Procedure Laterality Date   DILATION AND CURETTAGE OF UTERUS     [facility] Dr. Despina Hidden performed   ESOPHAGOGASTRODUODENOSCOPY (EGD) WITH PROPOFOL N/A 09/23/2018   Procedure: ESOPHAGOGASTRODUODENOSCOPY (EGD) WITH PROPOFOL;  Surgeon: Corbin Ade, MD;  Location: [facility];  Service: Endoscopy;  Laterality: N/A;  8:45am   ESOPHAGOGASTRODUODENOSCOPY ENDOSCOPY      OB History     Gravida  6   Para  4   Term  4   Preterm  0   AB  1   Living  4      SAB  1   IAB  0   Ectopic  0   Multiple  0   Live Births  4           Allergies  Allergen Reactions   Sulfa Antibiotics Nausea And Vomiting    Social History   Socioeconomic History   Marital status: Married    Spouse name: Not on file   Number of children: 1   Years of education: Not on file   Highest education level: Not on file  Occupational History   Occupation: [employer]    Employer: [employer]    Comment: respiratory therapy  Tobacco Use   Smoking status: Former    Types: Cigars    Quit date: 09/20/2016    Years since quitting: 5.2   Smokeless tobacco: Never  Vaping Use   Vaping Use: Never used  Substance and Sexual Activity   Alcohol use: Not Currently    Comment: occasionally   Drug use: No   Sexual activity: Yes    Birth control/protection: None  Other Topics Concern   Not on file  Social History Narrative   Not on file   Social Determinants of Health   Financial Resource Strain: Not on file  Food Insecurity: Not on file  Transportation Needs: Not on file   Physical Activity: Not on file  Stress: Not on file  Social Connections: Not on file    Family History  Problem Relation Age of Onset   Ulcers Father    Hypertension Father    Hypertension Mother    Ulcers Paternal Grandmother    Diabetes Paternal Grandmother    Ulcers Paternal Aunt    Liver disease Neg Hx    Colon cancer Neg Hx    Gastric cancer Neg Hx    Esophageal cancer Neg Hx

## 2021-12-04 DIAGNOSIS — Z419 Encounter for procedure for purposes other than remedying health state, unspecified: Secondary | ICD-10-CM | POA: Diagnosis not present

## 2021-12-05 ENCOUNTER — Other Ambulatory Visit: Payer: Self-pay | Admitting: Obstetrics & Gynecology

## 2021-12-05 ENCOUNTER — Other Ambulatory Visit: Payer: Self-pay | Admitting: Physician Assistant

## 2021-12-05 DIAGNOSIS — Z3682 Encounter for antenatal screening for nuchal translucency: Secondary | ICD-10-CM

## 2021-12-05 DIAGNOSIS — O30042 Twin pregnancy, dichorionic/diamniotic, second trimester: Secondary | ICD-10-CM

## 2021-12-09 ENCOUNTER — Ambulatory Visit: Payer: Medicaid Other | Admitting: *Deleted

## 2021-12-09 ENCOUNTER — Ambulatory Visit (INDEPENDENT_AMBULATORY_CARE_PROVIDER_SITE_OTHER): Payer: Medicaid Other | Admitting: Women's Health

## 2021-12-09 ENCOUNTER — Encounter: Payer: Self-pay | Admitting: Women's Health

## 2021-12-09 ENCOUNTER — Ambulatory Visit (INDEPENDENT_AMBULATORY_CARE_PROVIDER_SITE_OTHER): Payer: Medicaid Other

## 2021-12-09 ENCOUNTER — Other Ambulatory Visit: Payer: Self-pay

## 2021-12-09 VITALS — BP 105/66 | HR 82 | Wt 191.0 lb

## 2021-12-09 DIAGNOSIS — Z1379 Encounter for other screening for genetic and chromosomal anomalies: Secondary | ICD-10-CM | POA: Diagnosis not present

## 2021-12-09 DIAGNOSIS — Z6791 Unspecified blood type, Rh negative: Secondary | ICD-10-CM | POA: Diagnosis not present

## 2021-12-09 DIAGNOSIS — Z3682 Encounter for antenatal screening for nuchal translucency: Secondary | ICD-10-CM | POA: Diagnosis not present

## 2021-12-09 DIAGNOSIS — O0991 Supervision of high risk pregnancy, unspecified, first trimester: Secondary | ICD-10-CM

## 2021-12-09 DIAGNOSIS — O09299 Supervision of pregnancy with other poor reproductive or obstetric history, unspecified trimester: Secondary | ICD-10-CM | POA: Diagnosis not present

## 2021-12-09 DIAGNOSIS — O30042 Twin pregnancy, dichorionic/diamniotic, second trimester: Secondary | ICD-10-CM

## 2021-12-09 DIAGNOSIS — Z3A12 12 weeks gestation of pregnancy: Secondary | ICD-10-CM

## 2021-12-09 DIAGNOSIS — O26899 Other specified pregnancy related conditions, unspecified trimester: Secondary | ICD-10-CM

## 2021-12-09 DIAGNOSIS — Z1389 Encounter for screening for other disorder: Secondary | ICD-10-CM

## 2021-12-09 DIAGNOSIS — O30041 Twin pregnancy, dichorionic/diamniotic, first trimester: Secondary | ICD-10-CM

## 2021-12-09 DIAGNOSIS — Z3480 Encounter for supervision of other normal pregnancy, unspecified trimester: Secondary | ICD-10-CM

## 2021-12-09 LAB — POCT URINALYSIS DIPSTICK OB
Blood, UA: NEGATIVE
Glucose, UA: NEGATIVE
Ketones, UA: NEGATIVE
Leukocytes, UA: NEGATIVE
Nitrite, UA: NEGATIVE
POC,PROTEIN,UA: NEGATIVE

## 2021-12-09 MED ORDER — ASPIRIN 81 MG PO TBEC: 162.0000 mg | DELAYED_RELEASE_TABLET | Freq: Every day | ORAL | 2 refills | Status: DC

## 2021-12-09 MED ORDER — BLOOD PRESSURE MONITOR MISC: 0 refills | Status: AC

## 2021-12-09 NOTE — Progress Notes (Signed)
? ? ?INITIAL OBSTETRICAL VISIT ?Patient name: Monica Cox MRN [id number]  Date of birth: Aug 29, 1991 ?Chief Complaint:   ?Initial Prenatal Visit and Pregnancy Ultrasound ? ?History of Present Illness:   ?Monica Cox is a 31 y.o. [phone] African-American female at [redacted]w[redacted]d by  U/S @ 7wks  with an Estimated Date of Delivery: 06/17/22 being seen today for her initial obstetrical visit.   ?Confusion w/ EDC d/t u/s on 11/04/21 at hospital, per report twin A CRL c/w [redacted]w[redacted]d=EDC 9/21, twin B CRL c/w [redacted]w[redacted]d=EDC 9/12, however this doesn't add up when plugged into dating calculator- EDC should be 9/17, so changed in system. Amber reviewed u/s pics of that encounter.  ?Pt reports she has been having some bleeding, went to hospital 2/27 for same, per u/s report that day there was a small [facility] best appreciated anteriorly on TV u/s. Pt states is worse when she walks in stores. Is Rh neg, states she did not receive Rhogam. Not currently having any bleeding. ? ?Patient's last menstrual period was 09/21/2021. ?Her obstetrical history is significant for  SAB x 1, then term SVB x 4. H/O pre-e and pp pre-e needing mag and bp meds .   ?Last pap 03/20/21. Results were: NILM w/ HRHPV negative ? ?Depression screen [facility] 2/9 12/09/2021 06/25/2017  ?Decreased Interest 0 3  ?Down, Depressed, Hopeless 0 3  ?PHQ - 2 Score 0 6  ?Altered sleeping 1 3  ?Tired, decreased energy 0 3  ?Change in appetite 0 2  ?Feeling bad or failure about yourself  0 3  ?Trouble concentrating 0 2  ?Moving slowly or fidgety/restless 0 1  ?Suicidal thoughts 0 0  ?PHQ-9 Score 1 20  ?Difficult doing work/chores - Extremely dIfficult  ? ?  ?GAD 7 : Generalized Anxiety Score 12/09/2021  ?Nervous, Anxious, on Edge 1  ?Control/stop worrying 0  ?Worry too much - different things 0  ?Trouble relaxing 0  ?Restless 0  ?Easily annoyed or irritable 1  ?Afraid - awful might happen 0  ?Total GAD 7 Score 2  ? ? ? ?Review of Systems:   ?Pertinent items are noted in HPI ?Denies  cramping/contractions, leakage of fluid, vaginal bleeding, abnormal vaginal discharge w/ itching/odor/irritation, headaches, visual changes, shortness of breath, chest pain, abdominal pain, severe nausea/vomiting, or problems with urination or bowel movements unless otherwise stated above.  ?Pertinent History Reviewed:  ?Reviewed past medical,surgical, social, obstetrical and family history.  ?Reviewed problem list, medications and allergies. ?OB History  ?Gravida Para Term Preterm AB Living  ?6 4 4  0 1 4  ?SAB IAB Ectopic Multiple Live Births  ?1 0 0 0 4  ?  ?# Outcome Date GA Lbr Len/2nd Weight Sex Delivery Anes PTL Lv  ?6 Current           ?5 Term 01/11/18 [redacted]w[redacted]d 10:19 / 00:02 6 lb 15 oz (3.147 kg) F Vag-Spont Local, EPI  LIV  ?4 Term 10/05/16 [redacted]w[redacted]d / 00:20 8 lb 8.3 oz (3.864 kg) F Vag-Spont EPI  LIV  ?3 Term 11/19/14 [redacted]w[redacted]d 13:18 / 00:10 8 lb 9.4 oz (3.895 kg) F Vag-Spont EPI N LIV  ?   Complications: Pre-eclampsia  ?2 Term 04/24/09 [redacted]w[redacted]d  7 lb 9 oz (3.43 kg) M Vag-Spont EPI N LIV  ?   Birth Comments: immediate pp HTN  ?1 SAB           ? ?Physical Assessment:  ? ?Vitals:  ? 12/09/21 1145  ?BP: 105/66  ?Pulse: 82  ?Weight: 191 lb (  86.6 kg)  ?Body mass index is 33.83 kg/m?. ? ?     Physical Examination: ? General appearance - well appearing, and in no distress ? Mental status - alert, oriented to person, place, and time ? Psych:  She has a normal mood and affect ? Skin - warm and dry, normal color, no suspicious lesions noted ? Chest - effort normal, all lung fields clear to auscultation bilaterally ? Heart - normal rate and regular rhythm ? Abdomen - soft, nontender ? Extremities:  No swelling or varicosities noted ? Thin prep pap is not done  ? ?Chaperone: N/A   ? ?TODAY'S NT [place] DA/DC TWINS 12+1 wks,normal ovaries  ?BABY A left,measurements c/w dates,anterior placenta,FHR 152 bpm,CRL 60.27 mm,NB present,NT 1.3 mm ?BABY B right,measurements c/w dates,anterior placenta,FHR 151 bpm,CRL 57.33 mm,NB present,NT 1.3  mm ? ?Results for orders placed or performed in visit on 12/09/21 (from the past 24 hour(s))  ?POC Urinalysis Dipstick OB  ? Collection Time: 12/09/21 11:42 AM  ?Result Value Ref Range  ? Color, UA    ? Clarity, UA    ? Glucose, UA Negative Negative  ? Bilirubin, UA    ? Ketones, UA neg   ? Spec Grav, UA    ? Blood, UA neg   ? pH, UA    ? POC,PROTEIN,UA Negative Negative, Trace, Small (1+), Moderate (2+), Large (3+), 4+  ? Urobilinogen, UA    ? Nitrite, UA neg   ? Leukocytes, UA Negative Negative  ? Appearance    ? Odor    ?  ?Assessment & Plan:  ?1) High-Risk Pregnancy [id number] at [redacted]w[redacted]d with an Estimated Date of Delivery: 06/17/22  ? ?2) Initial OB visit ? ?3) DCDA twins ? ?4) H/O pre-e and pp pre-e> ASA 162mg , baseline labs ? ?5) Recent vaginal bleeding> small [place] on 2/27 u/s at hospital, nothing noted today. Last bleeding yesterday. Rhogam today. Pelvic rest until at least 7d from last evidence of blood. Avoid walking long distances in stores (per pt, makes worse) ? ?Meds:  ?Meds ordered this encounter  ?Medications  ? Blood Pressure Monitor MISC  ?  Sig: For regular home bp monitoring during pregnancy  ?  Dispense:  1 each  ?  Refill:  0  ?  Please mail to patient  ? aspirin 81 MG EC tablet  ?  Sig: Take 2 tablets (162 mg total) by mouth daily. Swallow whole.  ?  Dispense:  180 tablet  ?  Refill:  2  ?  Order Specific Question:   Supervising Provider  ?  Answer:   Tania Ade H [[id number]]  ? ? ?Initial labs obtained ?Continue prenatal vitamins ?Reviewed n/v relief measures and warning s/s to report ?Reviewed recommended weight gain based on pre-gravid BMI ?Encouraged well-balanced diet ?Genetic & carrier screening discussed: requests Panorama, NT/IT, and Horizon  ?Ultrasound discussed; fetal survey: requested ?CCNC completed> form faxed if has or is planning to apply for medicaid ?The nature of [place] [facility] with multiple MDs and other Advanced Practice Providers was explained to patient;  also emphasized that fellows, residents, and students are part of our team. ?Does not have home bp cuff. Office bp cuff given: no. Rx sent: yes. Check bp weekly, let us know if consistently >140/90.  ? ?Follow-up: Return in about 4 weeks (around 01/06/2022) for [place], 2nd IT, in person, MD or CNM.  ? ?Orders Placed This Encounter  ?Procedures  ? Urine Culture  ? GC/Chlamydia Probe  Amp  ? RHO (D) Immune Globulin  ? Integrated 1  ? CBC/D/Plt+RPR+Rh+ABO+RubIgG...  ? Comprehensive metabolic panel  ? Protein / creatinine ratio, urine  ? POC Urinalysis Dipstick OB  ? ? ?[place], WHNP-BC ?12/09/2021 ?12:52 PM  ?

## 2021-12-09 NOTE — Patient Instructions (Signed)
Monica Cox, thank you for choosing our office today! We appreciate the opportunity to meet your healthcare needs. You may receive a short survey by mail, e-mail, or through [employer]. If you are happy with your care we would appreciate if you could take just a few minutes to complete the survey questions. We read all of your comments and take your feedback very seriously. Thank you again for choosing our office.  Center for [employer] [facility] at [facility]  [facility] at [facility] ([address] [place], [place] [address]) Entrance C, located off of [address] Free 24/7 valet parking   Nausea & Vomiting Have saltine crackers or pretzels by your bed and eat a few bites before you raise your head out of bed in the morning Eat small frequent meals throughout the day instead of large meals Drink plenty of fluids throughout the day to stay hydrated, just don't drink a lot of fluids with your meals.  This can make your stomach fill up faster making you feel sick Do not brush your teeth right after you eat Products with real ginger are good for nausea, like ginger ale and ginger hard candy Make sure it says made with real ginger! Sucking on sour candy like lemon heads is also good for nausea If your prenatal vitamins make you nauseated, take them at night so you will sleep through the nausea Sea Bands If you feel like you need medicine for the nausea & vomiting please let us know If you are unable to keep any fluids or food down please let us know   Constipation Drink plenty of fluid, preferably water, throughout the day Eat foods high in fiber such as fruits, vegetables, and grains Exercise, such as walking, is a good way to keep your bowels regular Drink warm fluids, especially warm prune juice, or decaf coffee Eat a 1/2 cup of real oatmeal (not instant), 1/2 cup applesauce, and 1/2-1 cup warm prune juice every day If needed, you may take Colace (docusate sodium) stool softener  once or twice a day to help keep the stool soft.  If you still are having problems with constipation, you may take Miralax once daily as needed to help keep your bowels regular.   Home Blood Pressure Monitoring for Patients   Your provider has recommended that you check your blood pressure (BP) at least once a week at home. If you do not have a blood pressure cuff at home, one will be provided for you. Contact your provider if you have not received your monitor within 1 week.   Helpful Tips for Accurate Home Blood Pressure Checks  Don't smoke, exercise, or drink caffeine 30 minutes before checking your BP Use the restroom before checking your BP (a full bladder can raise your pressure) Relax in a comfortable upright chair Feet on the ground Left arm resting comfortably on a flat surface at the level of your heart Legs uncrossed Back supported Sit quietly and don't talk Place the cuff on your bare arm Adjust snuggly, so that only two fingertips can fit between your skin and the top of the cuff Check 2 readings separated by at least one minute Keep a log of your BP readings For a visual, please reference this diagram: http://ccnc.care/bpdiagram  Provider Name: [facility]     Phone: [phone]  Zone 1: ALL CLEAR  Continue to monitor your symptoms:  BP reading is less than 140 (top number) or less than 90 (bottom  number)  No right upper stomach pain No headaches or seeing spots No feeling nauseated or throwing up No swelling in face and hands  Zone 2: CAUTION Call your doctor's office for any of the following:  BP reading is greater than 140 (top number) or greater than 90 (bottom number)  Stomach pain under your ribs in the middle or right side Headaches or seeing spots Feeling nauseated or throwing up Swelling in face and hands  Zone 3: EMERGENCY  Seek immediate medical care if you have any of the following:  BP reading is greater than160 (top number) or greater than  110 (bottom number) Severe headaches not improving with Tylenol Serious difficulty catching your breath Any worsening symptoms from Zone 2    First Trimester of Pregnancy The first trimester of pregnancy is from week 1 until the end of week 12 (months 1 through 3). A week after a sperm fertilizes an egg, the egg will implant on the wall of the uterus. This embryo will begin to develop into a baby. Genes from you and your partner are forming the baby. The female genes determine whether the baby is a boy or a girl. At 6-8 weeks, the eyes and face are formed, and the heartbeat can be seen on ultrasound. At the end of 12 weeks, all the baby's organs are formed.  Now that you are pregnant, you will want to do everything you can to have a healthy baby. Two of the most important things are to get good prenatal care and to follow your health care provider's instructions. Prenatal care is all the medical care you receive before the baby's birth. This care will help prevent, find, and treat any problems during the pregnancy and childbirth. BODY CHANGES Your body goes through many changes during pregnancy. The changes vary from woman to woman.  You may gain or lose a couple of pounds at first. You may feel sick to your stomach (nauseous) and throw up (vomit). If the vomiting is uncontrollable, call your health care provider. You may tire easily. You may develop headaches that can be relieved by medicines approved by your health care provider. You may urinate more often. Painful urination may mean you have a bladder infection. You may develop heartburn as a result of your pregnancy. You may develop constipation because certain hormones are causing the muscles that push waste through your intestines to slow down. You may develop hemorrhoids or swollen, bulging veins (varicose veins). Your breasts may begin to grow larger and become tender. Your nipples may stick out more, and the tissue that surrounds them  (areola) may become darker. Your gums may bleed and may be sensitive to brushing and flossing. Dark spots or blotches (chloasma, mask of pregnancy) may develop on your face. This will likely fade after the baby is born. Your menstrual periods will stop. You may have a loss of appetite. You may develop cravings for certain kinds of food. You may have changes in your emotions from day to day, such as being excited to be pregnant or being concerned that something may go wrong with the pregnancy and baby. You may have more vivid and strange dreams. You may have changes in your hair. These can include thickening of your hair, rapid growth, and changes in texture. Some women also have hair loss during or after pregnancy, or hair that feels dry or thin. Your hair will most likely return to normal after your baby is born. WHAT TO EXPECT AT YOUR PRENATAL   VISITS During a routine prenatal visit: You will be weighed to make sure you and the baby are growing normally. Your blood pressure will be taken. Your abdomen will be measured to track your baby's growth. The fetal heartbeat will be listened to starting around week 10 or 12 of your pregnancy. Test results from any previous visits will be discussed. Your health care provider may ask you: How you are feeling. If you are feeling the baby move. If you have had any abnormal symptoms, such as leaking fluid, bleeding, severe headaches, or abdominal cramping. If you have any questions. Other tests that may be performed during your first trimester include: Blood tests to find your blood type and to check for the presence of any previous infections. They will also be used to check for low iron levels (anemia) and Rh antibodies. Later in the pregnancy, blood tests for diabetes will be done along with other tests if problems develop. Urine tests to check for infections, diabetes, or protein in the urine. An ultrasound to confirm the proper growth and development  of the baby. An amniocentesis to check for possible genetic problems. Fetal screens for spina bifida and Down syndrome. You may need other tests to make sure you and the baby are doing well. HOME CARE INSTRUCTIONS  Medicines Follow your health care provider's instructions regarding medicine use. Specific medicines may be either safe or unsafe to take during pregnancy. Take your prenatal vitamins as directed. If you develop constipation, try taking a stool softener if your health care provider approves. Diet Eat regular, well-balanced meals. Choose a variety of foods, such as meat or vegetable-based protein, fish, milk and low-fat dairy products, vegetables, fruits, and whole grain breads and cereals. Your health care provider will help you determine the amount of weight gain that is right for you. Avoid raw meat and uncooked cheese. These carry germs that can cause birth defects in the baby. Eating four or five small meals rather than three large meals a day may help relieve nausea and vomiting. If you start to feel nauseous, eating a few soda crackers can be helpful. Drinking liquids between meals instead of during meals also seems to help nausea and vomiting. If you develop constipation, eat more high-fiber foods, such as fresh vegetables or fruit and whole grains. Drink enough fluids to keep your urine clear or pale yellow. Activity and Exercise Exercise only as directed by your health care provider. Exercising will help you: Control your weight. Stay in shape. Be prepared for labor and delivery. Experiencing pain or cramping in the lower abdomen or low back is a good sign that you should stop exercising. Check with your health care provider before continuing normal exercises. Try to avoid standing for long periods of time. Move your legs often if you must stand in one place for a long time. Avoid heavy lifting. Wear low-heeled shoes, and practice good posture. You may continue to have sex  unless your health care provider directs you otherwise. Relief of Pain or Discomfort Wear a good support bra for breast tenderness.   Take warm sitz baths to soothe any pain or discomfort caused by hemorrhoids. Use hemorrhoid cream if your health care provider approves.   Rest with your legs elevated if you have leg cramps or low back pain. If you develop varicose veins in your legs, wear support hose. Elevate your feet for 15 minutes, 3-4 times a day. Limit salt in your diet. Prenatal Care Schedule your prenatal visits by the   twelfth week of pregnancy. They are usually scheduled monthly at first, then more often in the last 2 months before delivery. Write down your questions. Take them to your prenatal visits. Keep all your prenatal visits as directed by your health care provider. Safety Wear your seat belt at all times when driving. Make a list of emergency phone numbers, including numbers for family, friends, the hospital, and police and fire departments. General Tips Ask your health care provider for a referral to a local prenatal education class. Begin classes no later than at the beginning of month 6 of your pregnancy. Ask for help if you have counseling or nutritional needs during pregnancy. Your health care provider can offer advice or refer you to specialists for help with various needs. Do not use hot tubs, steam rooms, or saunas. Do not douche or use tampons or scented sanitary pads. Do not cross your legs for long periods of time. Avoid cat litter boxes and soil used by cats. These carry germs that can cause birth defects in the baby and possibly loss of the fetus by miscarriage or stillbirth. Avoid all smoking, herbs, alcohol, and medicines not prescribed by your health care provider. Chemicals in these affect the formation and growth of the baby. Schedule a dentist appointment. At home, brush your teeth with a soft toothbrush and be gentle when you floss. SEEK MEDICAL CARE IF:   You have dizziness. You have mild pelvic cramps, pelvic pressure, or nagging pain in the abdominal area. You have persistent nausea, vomiting, or diarrhea. You have a bad smelling vaginal discharge. You have pain with urination. You notice increased swelling in your face, hands, legs, or ankles. SEEK IMMEDIATE MEDICAL CARE IF:  You have a fever. You are leaking fluid from your vagina. You have spotting or bleeding from your vagina. You have severe abdominal cramping or pain. You have rapid weight gain or loss. You vomit blood or material that looks like coffee grounds. You are exposed to German measles and have never had them. You are exposed to fifth disease or chickenpox. You develop a severe headache. You have shortness of breath. You have any kind of trauma, such as from a fall or a car accident. Document Released: 09/16/2001 Document Revised: 02/06/2014 Document Reviewed: 08/02/2013 ExitCare Patient Information 2015 [employer]. This information is not intended to replace advice given to you by your health care provider. Make sure you discuss any questions you have with your health care provider.  

## 2021-12-09 NOTE — Progress Notes (Addendum)
[place] DA/DC TWINS 12+1 wks,normal ovaries  ?BABY A left,measurements c/w dates,anterior placenta,FHR 152 bpm,CRL 60.27 mm,NB present,NT 1.3 mm ?BABY B right,measurements c/w dates,anterior placenta,FHR 151 bpm,CRL 57.33 mm,NB present,NT 1.3 mm ?

## 2021-12-10 ENCOUNTER — Other Ambulatory Visit: Payer: Self-pay | Admitting: *Deleted

## 2021-12-10 ENCOUNTER — Telehealth: Payer: Self-pay | Admitting: Women's Health

## 2021-12-10 MED ORDER — PROMETHAZINE HCL 25 MG PO TABS: 25.0000 mg | ORAL_TABLET | Freq: Four times a day (QID) | ORAL | 0 refills | Status: DC | PRN

## 2021-12-10 NOTE — Telephone Encounter (Signed)
Refill request to Dr. Despina Hidden. ?

## 2021-12-10 NOTE — Telephone Encounter (Signed)
Patient is calling wanting to know if she could get a script for a medicine that is out for  phenergan 25 mg tablet and to be sent [facility] [address] [place]  ?

## 2021-12-11 LAB — GC/CHLAMYDIA PROBE AMP
Chlamydia trachomatis, NAA: NEGATIVE
Neisseria Gonorrhoeae by PCR: NEGATIVE

## 2021-12-12 LAB — URINE CULTURE

## 2021-12-13 LAB — COMPREHENSIVE METABOLIC PANEL
ALT: 16 IU/L (ref 0–32)
AST: 19 IU/L (ref 0–40)
Albumin/Globulin Ratio: 1.4 (ref 1.2–2.2)
Albumin: 3.9 g/dL (ref 3.8–4.8)
Alkaline Phosphatase: 84 IU/L (ref 44–121)
BUN/Creatinine Ratio: 10 (ref 9–23)
BUN: 6 mg/dL (ref 6–20)
Bilirubin Total: 0.2 mg/dL (ref 0.0–1.2)
CO2: 21 mmol/L (ref 20–29)
Calcium: 9.4 mg/dL (ref 8.7–10.2)
Chloride: 101 mmol/L (ref 96–106)
Creatinine, Ser: 0.58 mg/dL (ref 0.57–1.00)
Globulin, Total: 2.8 g/dL (ref 1.5–4.5)
Glucose: 77 mg/dL (ref 70–99)
Potassium: 3.7 mmol/L (ref 3.5–5.2)
Sodium: 137 mmol/L (ref 134–144)
Total Protein: 6.7 g/dL (ref 6.0–8.5)
eGFR: 123 mL/min/{1.73_m2} (ref >59)

## 2021-12-13 LAB — INTEGRATED 1
Crown Rump Length Twin B: 57 mm
Crown Rump Length: 60.3 mm
Gest. Age on Collection Date: 12.3 weeks
Maternal Age at EDD: 32.8 yr
NT Twin B: 1.3 mm
Nuchal Translucency (NT): 1.3 mm
Number of Fetuses: 2
PAPP-A Value: 2394 ng/mL
Weight: 191 [lb_av]

## 2021-12-13 LAB — HCV INTERPRETATION

## 2021-12-13 LAB — CBC/D/PLT+RPR+RH+ABO+RUBIGG...
Antibody Screen: NEGATIVE
Basophils Absolute: 0 10*3/uL (ref 0.0–0.2)
Basos: 0 %
EOS (ABSOLUTE): 0.1 10*3/uL (ref 0.0–0.4)
Eos: 1 %
HCV Ab: NONREACTIVE
HIV Screen 4th Generation wRfx: NONREACTIVE
Hematocrit: 33.8 % — ABNORMAL LOW (ref 34.0–46.6)
Hemoglobin: 11.6 g/dL (ref 11.1–15.9)
Hepatitis B Surface Ag: NEGATIVE
Immature Grans (Abs): 0 10*3/uL (ref 0.0–0.1)
Immature Granulocytes: 0 %
Lymphocytes Absolute: 1.2 10*3/uL (ref 0.7–3.1)
Lymphs: 16 %
MCH: 31.3 pg (ref 26.6–33.0)
MCHC: 34.3 g/dL (ref 31.5–35.7)
MCV: 91 fL (ref 79–97)
Monocytes Absolute: 0.5 10*3/uL (ref 0.1–0.9)
Monocytes: 6 %
Neutrophils Absolute: 5.9 10*3/uL (ref 1.4–7.0)
Neutrophils: 77 %
Platelets: 251 10*3/uL (ref 150–450)
RBC: 3.71 x10E6/uL — ABNORMAL LOW (ref 3.77–5.28)
RDW: 12.9 % (ref 11.7–15.4)
RPR Ser Ql: NONREACTIVE
Rh Factor: NEGATIVE
Rubella Antibodies, IGG: 2.03 index (ref >0.99)
WBC: 7.7 10*3/uL (ref 3.4–10.8)

## 2021-12-13 LAB — PROTEIN / CREATININE RATIO, URINE
Creatinine, Urine: 173.7 mg/dL
Protein, Ur: 11.2 mg/dL
Protein/Creat Ratio: 64 mg/g creat (ref 0–200)

## 2021-12-16 ENCOUNTER — Telehealth: Payer: Self-pay | Admitting: Women's Health

## 2021-12-16 DIAGNOSIS — R8271 Bacteriuria: Secondary | ICD-10-CM | POA: Insufficient documentation

## 2021-12-16 MED ORDER — NITROFURANTOIN MONOHYD MACRO 100 MG PO CAPS: 100.0000 mg | ORAL_CAPSULE | Freq: Two times a day (BID) | ORAL | 0 refills | Status: DC

## 2021-12-16 NOTE — Telephone Encounter (Signed)
Patient calling about test results.  

## 2021-12-16 NOTE — Telephone Encounter (Signed)
Returned pt's call, two identifiers used. Pt's urine culture results were reviewed with pt. Pt had several questions. Informed pt that a provider would be asked and her questions would be addressed. Attempted to help pt get access to Mychart. Pt confirmed understanding. ?

## 2021-12-16 NOTE — Addendum Note (Signed)
Addended by: Cheral Marker on: 12/16/2021 09:40 AM ? ? Modules accepted: Orders ? ?

## 2021-12-18 ENCOUNTER — Encounter: Payer: Self-pay | Admitting: Women's Health

## 2021-12-19 ENCOUNTER — Telehealth: Payer: Self-pay | Admitting: *Deleted

## 2021-12-19 MED ORDER — POLYMYXIN B-TRIMETHOPRIM 10000-0.1 UNIT/ML-% OP SOLN: OPHTHALMIC | 0 refills | Status: DC

## 2021-12-19 NOTE — Telephone Encounter (Signed)
Patient called stating last night she noticed her right eye was red and had a "small bump" on the eyelid.  She didn't think a lot about it then but woke up and noticed the eyeball was red with yellow drainage along with a sty today.    She is requesting eye drops be sent in to her pharmacy if possible. Please advise.   ?

## 2021-12-19 NOTE — Addendum Note (Signed)
Addended by: Cheral Marker on: 12/19/2021 03:24 PM ? ? Modules accepted: Orders ? ?

## 2021-12-23 ENCOUNTER — Encounter: Payer: Self-pay | Admitting: Women's Health

## 2021-12-30 ENCOUNTER — Ambulatory Visit: Payer: Medicaid Other | Admitting: *Deleted

## 2022-01-03 ENCOUNTER — Ambulatory Visit (INDEPENDENT_AMBULATORY_CARE_PROVIDER_SITE_OTHER): Payer: Medicaid Other | Admitting: Women's Health

## 2022-01-03 ENCOUNTER — Encounter: Payer: Self-pay | Admitting: Women's Health

## 2022-01-03 VITALS — BP 117/72 | HR 95 | Wt 199.0 lb

## 2022-01-03 DIAGNOSIS — O0992 Supervision of high risk pregnancy, unspecified, second trimester: Secondary | ICD-10-CM

## 2022-01-03 DIAGNOSIS — F418 Other specified anxiety disorders: Secondary | ICD-10-CM

## 2022-01-03 DIAGNOSIS — Z1379 Encounter for other screening for genetic and chromosomal anomalies: Secondary | ICD-10-CM

## 2022-01-03 DIAGNOSIS — Z87440 Personal history of urinary (tract) infections: Secondary | ICD-10-CM | POA: Diagnosis not present

## 2022-01-03 DIAGNOSIS — Z3A15 15 weeks gestation of pregnancy: Secondary | ICD-10-CM | POA: Diagnosis not present

## 2022-01-03 DIAGNOSIS — O30042 Twin pregnancy, dichorionic/diamniotic, second trimester: Secondary | ICD-10-CM

## 2022-01-03 MED ORDER — ESCITALOPRAM OXALATE 10 MG PO TABS: 10.0000 mg | ORAL_TABLET | Freq: Every day | ORAL | 6 refills | Status: DC

## 2022-01-03 NOTE — Progress Notes (Signed)
? ?HIGH-RISK PREGNANCY VISIT ?Patient name: Monica Cox MRN [id number]  Date of birth: 13-Apr-1991 ?Chief Complaint:   ?Routine Prenatal Visit ? ?History of Present Illness:   ?Monica Cox is a 31 y.o. [phone] female at [redacted]w[redacted]d with an Estimated Date of Delivery: 06/22/22 being seen today for ongoing management of a high-risk pregnancy complicated by multiple gestation DCDA twins.   ? ?Today she reports  feels dep/anx coming back, more anxiety, remembers using lexapro in past and thinks it worked well, wants to restart . Contractions: Not present.  .  Movement: Absent. denies leaking of fluid.  ? ? ?  12/09/2021  ? 11:33 AM 06/25/2017  ?  3:34 PM  ?Depression screen PHQ 2/9  ?Decreased Interest 0 3  ?Down, Depressed, Hopeless 0 3  ?PHQ - 2 Score 0 6  ?Altered sleeping 1 3  ?Tired, decreased energy 0 3  ?Change in appetite 0 2  ?Feeling bad or failure about yourself  0 3  ?Trouble concentrating 0 2  ?Moving slowly or fidgety/restless 0 1  ?Suicidal thoughts 0 0  ?PHQ-9 Score 1 20  ?Difficult doing work/chores  Extremely dIfficult  ? ?  ? ?  12/09/2021  ? 11:44 AM  ?GAD 7 : Generalized Anxiety Score  ?Nervous, Anxious, on Edge 1  ?Control/stop worrying 0  ?Worry too much - different things 0  ?Trouble relaxing 0  ?Restless 0  ?Easily annoyed or irritable 1  ?Afraid - awful might happen 0  ?Total GAD 7 Score 2  ? ? ? ?Review of Systems:   ?Pertinent items are noted in HPI ?Denies abnormal vaginal discharge w/ itching/odor/irritation, headaches, visual changes, shortness of breath, chest pain, abdominal pain, severe nausea/vomiting, or problems with urination or bowel movements unless otherwise stated above. ?Pertinent History Reviewed:  ?Reviewed past medical,surgical, social, obstetrical and family history.  ?Reviewed problem list, medications and allergies. ?Physical Assessment:  ? ?Vitals:  ? 01/03/22 1141  ?BP: 117/72  ?Pulse: 95  ?Weight: 199 lb (90.3 kg)  ?Body mass index is 35.25 kg/m?. ?     ?      Physical Examination:  ? General appearance: alert, well appearing, and in no distress ? Mental status: alert, oriented to person, place, and time ? Skin: warm & dry  ? Extremities: Edema: None  ?  Cardiovascular: normal heart rate noted ? Respiratory: normal respiratory effort, no distress ? Abdomen: gravid, soft, non-tender ? Pelvic: Cervical exam deferred        ? ?Fetal Status: Fetal Heart Rate (bpm): +u/s   Movement: Absent  +FCA and active fetus x2 on informal TA u/s ? ?Chaperone: N/A   ? ?No results found for this or any previous visit (from the past 24 hour(s)).  ?Assessment & Plan:  ?High-risk pregnancy: [id number] at [redacted]w[redacted]d with an Estimated Date of Delivery: 06/22/22  ? ?1) DCDA twins, stable ? ?2) H/O pre-e and pp pre-e, ASA ? ?3) Dep/anx> rx lexapro 10mg  daily, [facility] referral ? ?4) Recent ASB> urine cx poc today ? ?Meds:  ?Meds ordered this encounter  ?Medications  ? escitalopram (LEXAPRO) 10 MG tablet  ?  Sig: Take 1 tablet (10 mg total) by mouth daily.  ?  Dispense:  30 tablet  ?  Refill:  6  ?  Order Specific Question:   Supervising Provider  ?  Answer:   Tania Ade H [[id number]]  ? ? ?Labs/procedures today: 2nd IT ? ?Treatment Plan:    U/S q4wks    2x/wk testing @  36wks or weekly BPP    Deliver @ 38wks:____  ? ?Reviewed: Preterm labor symptoms and general obstetric precautions including but not limited to vaginal bleeding, contractions, leaking of fluid and fetal movement were reviewed in detail with the patient.  All questions were answered. Does have home bp cuff. Office bp cuff given: not applicable. Check bp weekly, let us know if consistently >140 and/or >90. ? ?Follow-up: Return in about 3 weeks (around 01/24/2022) for HROB, Twins, [id number], MD or CNM, in person. ? ? ?Future Appointments  ?Date Time Provider [place]  ?01/22/2022  1:30 PM [facility] - [facility] [place] [facility] None  ?01/22/2022  3:10 PM Janyth Pupa, DO [facility]  ? ? ?Orders Placed This Encounter  ?Procedures  ? Urine Culture  ? US  OB Comp + 14 Wk  ? US OB Comp AddL Gest + 14 Wk  ? INTEGRATED 2  ? Amb ref to [place]  ? ?[place], [place] ?01/03/2022 ?1:21 PM  ?

## 2022-01-03 NOTE — Patient Instructions (Signed)
Luanna Cole, thank you for choosing our office today! We appreciate the opportunity to meet your healthcare needs. You may receive a short survey by mail, e-mail, or through [employer]. If you are happy with your care we would appreciate if you could take just a few minutes to complete the survey questions. We read all of your comments and take your feedback very seriously. Thank you again for choosing our office.  ?Center for [employer] Team at [facility] ?[facility] at [facility] ?([address] [place], [place] [address]) ?Entrance C, located off of [address] ?Free 24/7 valet parking  ?Go to [email or web address] to register for FREE online childbirth classes ? ?Call the office [phone]) or go to [facility] if: ?You begin to severe cramping ?Your water breaks.  Sometimes it is a big gush of fluid, sometimes it is just a trickle that keeps getting your panties wet or running down your legs ?You have vaginal bleeding.  It is normal to have a small amount of spotting if your cervix was checked.  ? ?[place] Pediatricians/Family Doctors ?[facility]): [address], [phone]           ?[facility]: [address], [phone]                ?[facility]): [address], [phone] (call to ask if accepting patients) ?[facility] Department: [address], [place], [phone]   ? ?[place] Pediatricians/Family Doctors ?[facility]): [address], [phone] ?[facility]: [address] [place], [phone] ?[facility]: [address], [phone] ? ?[employer] Family Doctors  ?[facility]): [phone] ?[facility]: [address], [phone]  ? ?[facility] Family Doctors ?[facility]: [address], [phone] ? ?[facility]  ?[facility]: [phone], [phone] ? ?Home Blood Pressure Monitoring for Patients  ? ?Your provider has recommended that you check your blood pressure (BP) at least once a week at home. If you do not have a blood pressure cuff at home, one will be provided for you. Contact your provider if you have not received your monitor within 1 week.  ? ?Helpful Tips for Accurate Home Blood Pressure Checks  ?Don't smoke, exercise, or drink caffeine 30 minutes before checking your BP ?Use the restroom before checking your BP (a full bladder can raise your pressure) ?Relax in a comfortable upright chair ?Feet on the ground ?Left arm resting comfortably on a flat surface at the level of your heart ?Legs uncrossed ?Back supported ?Sit quietly and don't talk ?Place the cuff on your bare arm ?Adjust snuggly, so that only two fingertips can fit between your skin and the top of the cuff ?Check 2 readings separated by at least one minute ?Keep a log of your BP readings ?For a visual, please reference this diagram: http://ccnc.care/bpdiagram ? ?Provider Name: [facility]     Phone: [phone] ? ?Zone 1: ALL CLEAR  ?Continue to monitor your symptoms:  ?BP reading is less than 140 (top number) or less than 90 (bottom number)  ?No right upper stomach pain ?No headaches or seeing spots ?No feeling nauseated or throwing up ?No swelling in face and hands ? ?Zone 2: CAUTION ?Call your doctor's office for any of the following:  ?BP reading is greater than 140 (top number) or greater than  90 (bottom number)  ?Stomach pain under your ribs in the middle or right side ?Headaches or seeing spots ?Feeling nauseated or throwing up ?Swelling in face and hands ? ?Zone 3: EMERGENCY  ?Seek immediate medical care if you have any of the following:  ?BP reading is greater than160 (top number) or greater than 110 (bottom number) ?Severe headaches not improving with Tylenol ?Serious difficulty catching your breath ?Any worsening symptoms from  Zone 2  ? ?  ?Second Trimester of Pregnancy ?The second trimester is from week 14 through week 27 (months 4 through 6). The second trimester is often a time when you feel your best. Your body has adjusted to being pregnant, and you begin to feel better physically. Usually, morning sickness has lessened or quit completely, you may have more energy, and you may have an increase in appetite. The second trimester is also a time when the fetus is growing rapidly. At the end of the sixth month, the fetus is about 9 inches long and weighs about 1? pounds. You will likely begin to feel the baby move (quickening) between 16 and 20 weeks of pregnancy. ?Body changes during your second trimester ?Your body continues to go through many changes during your second trimester. The changes vary from woman to woman. ?Your weight will continue to increase. You will notice your lower abdomen bulging out. ?You may begin to get stretch marks on your hips, abdomen, and breasts. ?You may develop headaches that can be relieved by medicines. The medicines should be approved by your health care provider. ?You may urinate more often because the fetus is pressing on your bladder. ?You may develop or continue to have heartburn as a result of your pregnancy. ?You may develop constipation because certain hormones are causing the muscles that push waste through your intestines to slow down. ?You may develop hemorrhoids or swollen, bulging veins (varicose veins). ?You may have back pain. This is caused by: ?Weight gain. ?Pregnancy hormones that are relaxing the joints in your pelvis. ?A shift in weight and the muscles that support your balance. ?Your breasts will continue to grow and they will continue to become tender. ?Your gums may bleed and may be sensitive to brushing and flossing. ?Dark spots or blotches (chloasma, mask of pregnancy) may develop on your face. This will likely fade after the baby is born. ?A dark line from your belly button to  the pubic area (linea nigra) may appear. This will likely fade after the baby is born. ?You may have changes in your hair. These can include thickening of your hair, rapid growth, and changes in texture. Some women also have hair loss during or after pregnancy, or hair that feels dry or thin. Your hair will most likely return to normal after your baby is born. ? ?What to expect at prenatal visits ?During a routine prenatal visit: ?You will be weighed to make sure you and the fetus are growing normally. ?Your blood pressure will be taken. ?Your abdomen will be measured to track your baby's growth. ?The fetal heartbeat will be listened to. ?Any test results from the previous visit will be discussed. ? ?Your health care provider may ask you: ?How you are feeling. ?If you are feeling the baby move. ?If you have had any abnormal symptoms, such as leaking fluid, bleeding, severe headaches, or abdominal cramping. ?If you are using any tobacco products, including cigarettes, chewing tobacco, and electronic cigarettes. ?If you have any questions. ? ?Other tests that may be performed during  your second trimester include: ?Blood tests that check for: ?Low iron levels (anemia). ?High blood sugar that affects pregnant women (gestational diabetes) between 59 and 28 weeks. ?Rh antibodies. This is to check for a protein on red blood cells (Rh factor). ?Urine tests to check for infections, diabetes, or protein in the urine. ?An ultrasound to confirm the proper growth and development of the baby. ?An amniocentesis to check for possible genetic problems. ?Fetal screens for spina bifida and Down syndrome. ?HIV (human immunodeficiency virus) testing. Routine prenatal testing includes screening for HIV, unless you choose not to have this test. ? ?Follow these instructions at home: ?Medicines ?Follow your health care provider's instructions regarding medicine use. Specific medicines may be either safe or unsafe to take during  pregnancy. ?Take a prenatal vitamin that contains at least 600 micrograms (mcg) of folic acid. ?If you develop constipation, try taking a stool softener if your health care provider approves. ?Eating and drinking ?Eat

## 2022-01-04 DIAGNOSIS — Z419 Encounter for procedure for purposes other than remedying health state, unspecified: Secondary | ICD-10-CM | POA: Diagnosis not present

## 2022-01-07 LAB — INTEGRATED 2
AFP MoM: 3.61
Alpha-Fetoprotein: 106.4 ng/mL
Crown Rump Length Twin B: 57 mm
Crown Rump Length: 60.3 mm
DIA MoM: 3.66
DIA Value: 495.7 pg/mL
Estriol, Unconjugated: 1.4 ng/mL
Gest. Age on Collection Date: 12.3 weeks
Gestational Age: 15.9 weeks
Maternal Age at EDD: 32.8 yr
NT MoM Twin B: 1.02
NT Twin B: 1.3 mm
Nuchal Translucency (NT): 1.3 mm
Nuchal Translucency MoM: 0.97
Number of Fetuses: 2
PAPP-A MoM: 3.42
PAPP-A Value: 2394 ng/mL
Weight: 191 [lb_av]
Weight: 191 [lb_av]
hCG MoM: 2.88
hCG Value: 95.6 IU/mL
uE3 MoM: 1.67

## 2022-01-07 LAB — URINE CULTURE

## 2022-01-13 ENCOUNTER — Other Ambulatory Visit: Payer: Medicaid Other

## 2022-01-21 ENCOUNTER — Encounter: Payer: Medicaid Other | Admitting: Obstetrics & Gynecology

## 2022-01-22 ENCOUNTER — Other Ambulatory Visit: Payer: Medicaid Other

## 2022-01-22 ENCOUNTER — Encounter: Payer: Medicaid Other | Admitting: Medical

## 2022-01-23 DIAGNOSIS — Z3400 Encounter for supervision of normal first pregnancy, unspecified trimester: Secondary | ICD-10-CM | POA: Diagnosis not present

## 2022-01-25 ENCOUNTER — Other Ambulatory Visit: Payer: Self-pay | Admitting: Women's Health

## 2022-02-03 DIAGNOSIS — Z419 Encounter for procedure for purposes other than remedying health state, unspecified: Secondary | ICD-10-CM | POA: Diagnosis not present

## 2022-02-03 IMAGING — US US OB COMP LESS 14 WK
1 series · 14 of 28 positions shown · non-contrast
Comparison: None.

CLINICAL DATA: Vomiting with pregnancy.  Cramping.

EXAM:
TWIN OBSTETRICAL ULTRASOUND <14 WKS
TECHNIQUE: Transabdominal ultrasound was performed for evaluation of the
gestation as well as the maternal uterus and adnexal regions.

[Series 1: early ob us · arterial · 14 of 77 slices shown]
[im 3/77]
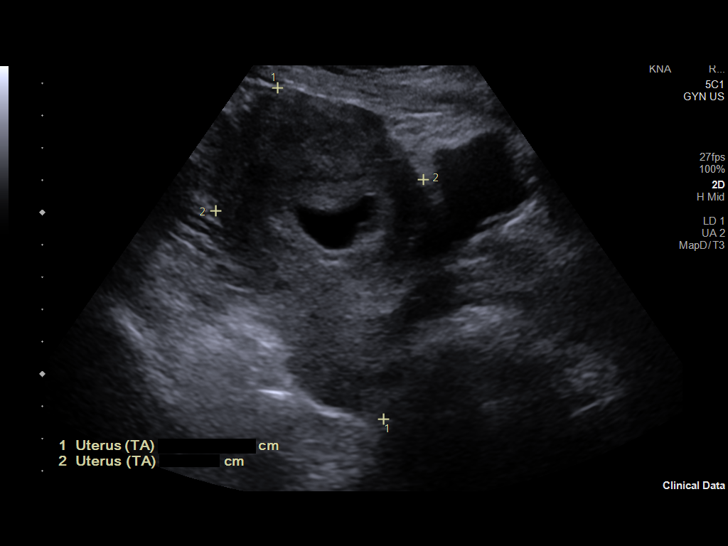
[im 9/77]
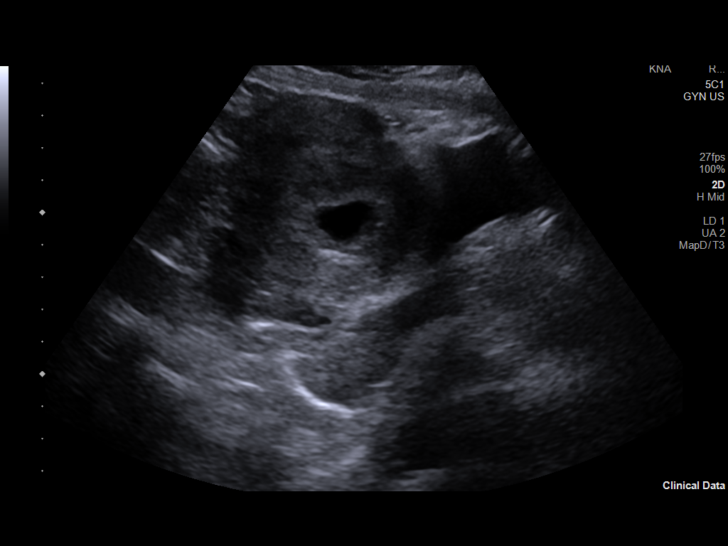
[im 15/77]
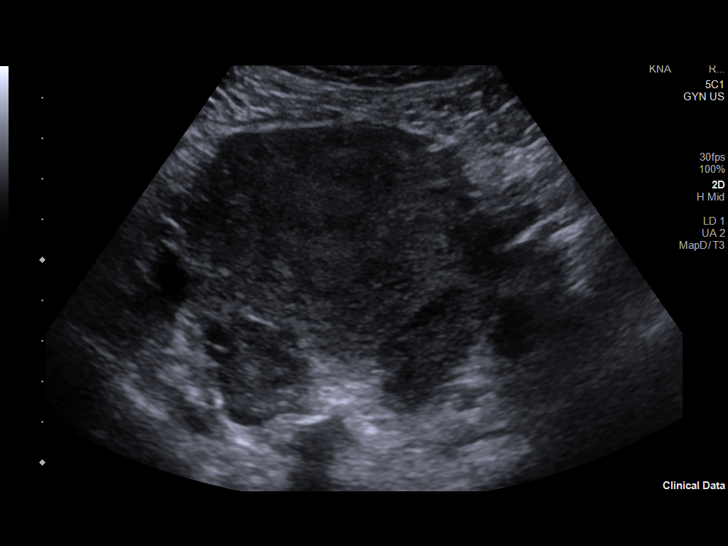
[im 20/77]
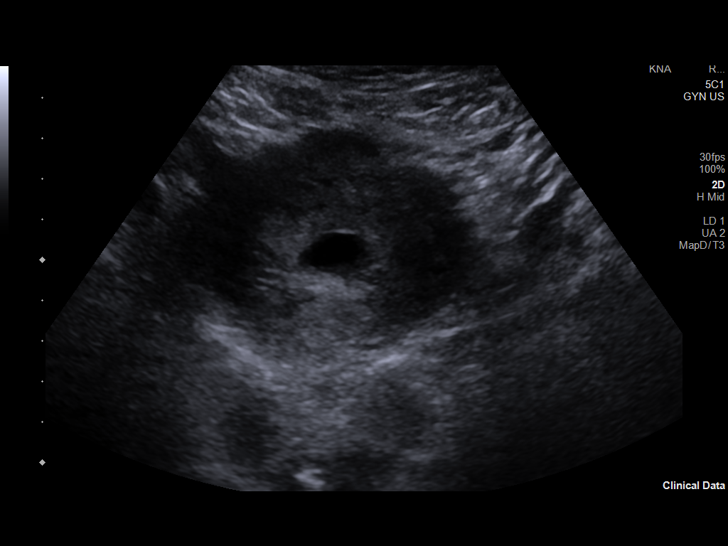
[im 26/77]
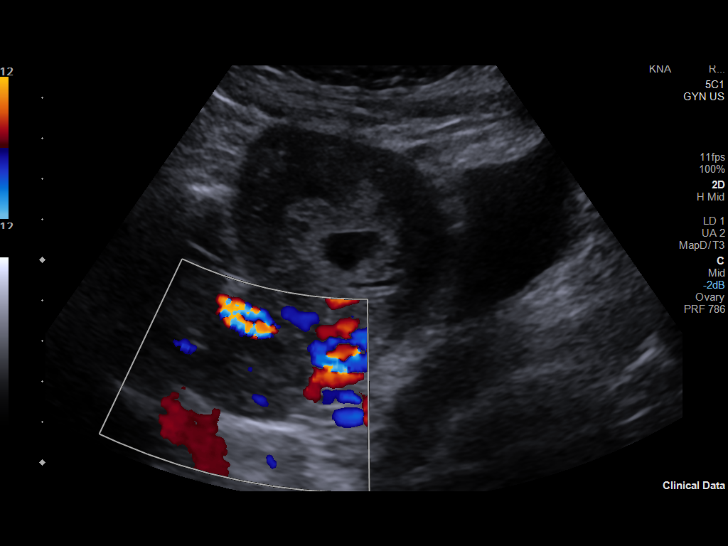
[im 31/77]
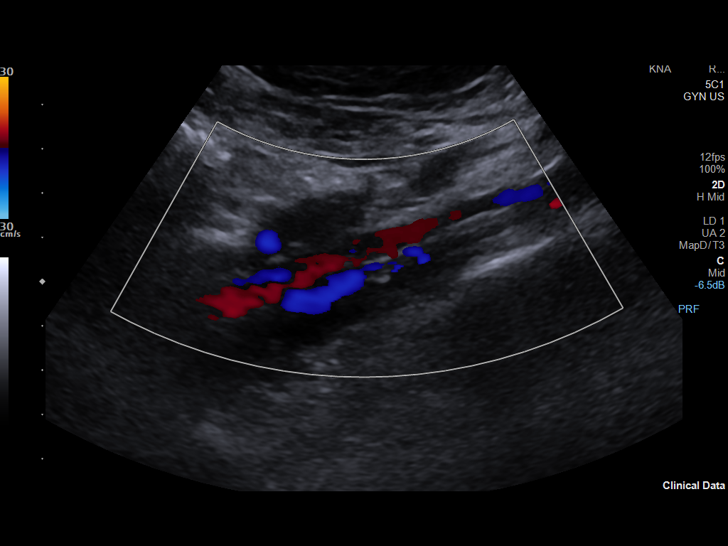
[im 37/77]
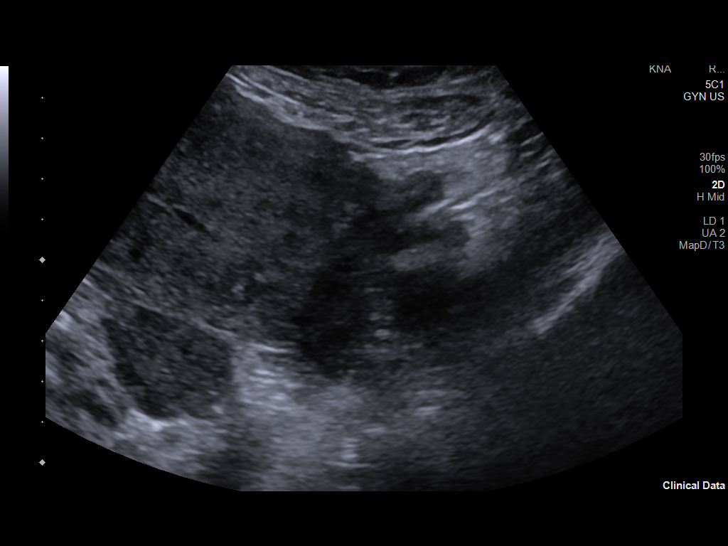
[im 43/77]
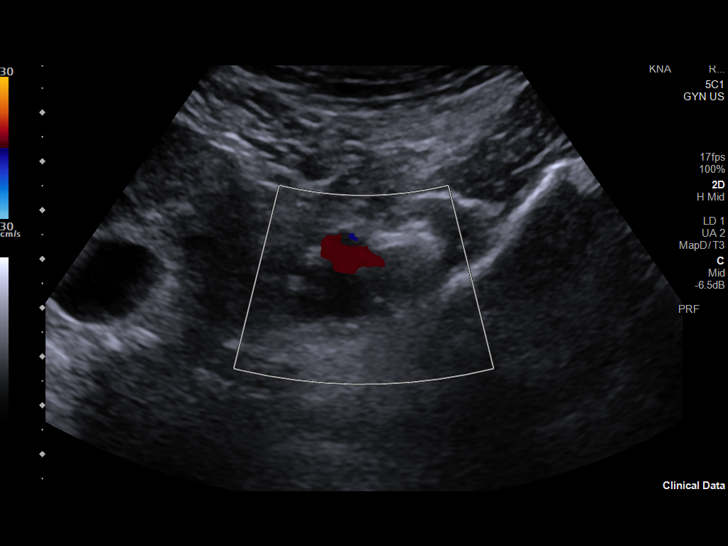
[im 48/77]
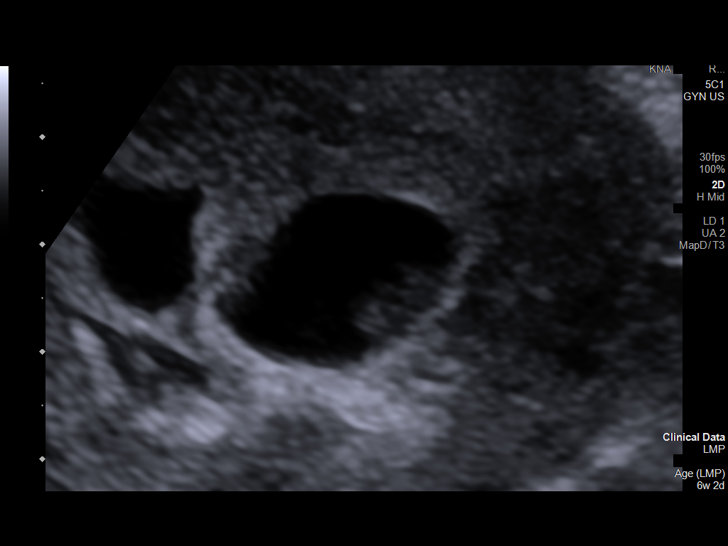
[im 54/77]
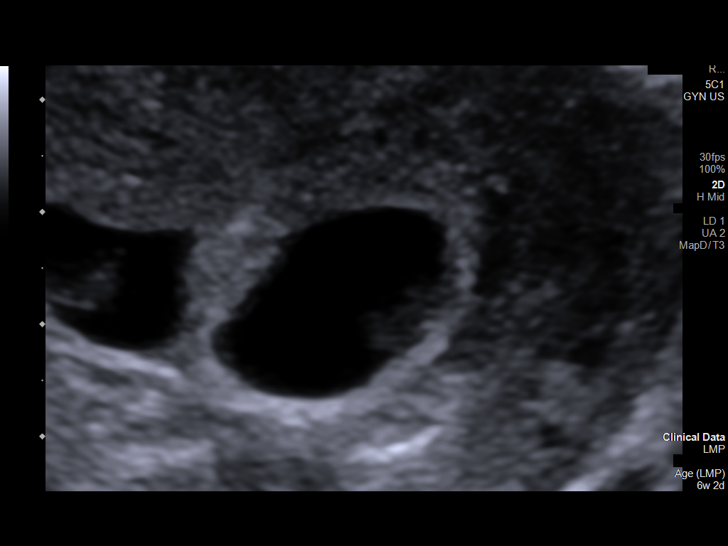
[im 60/77]
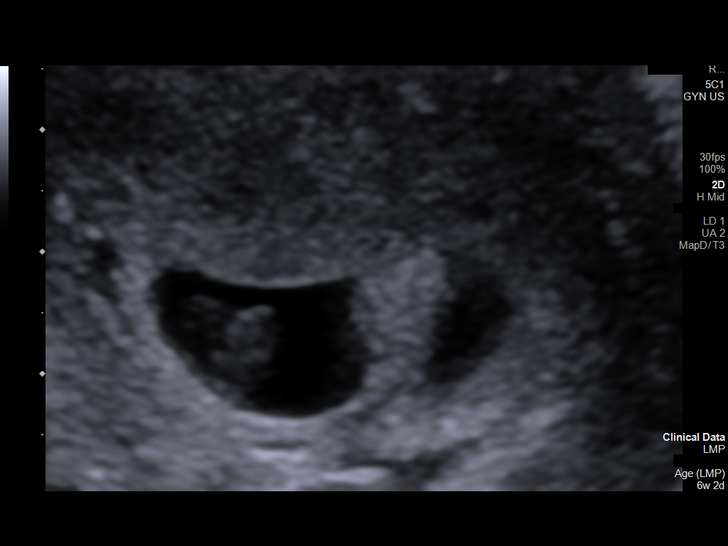
[im 65/77]
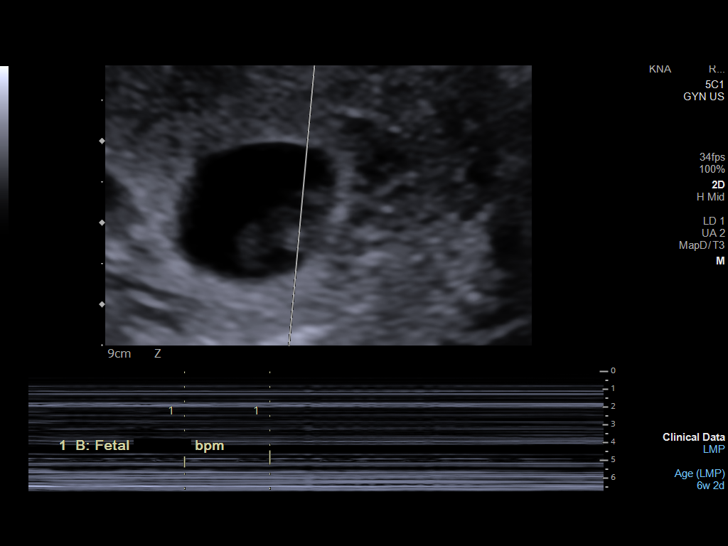
[im 71/77]
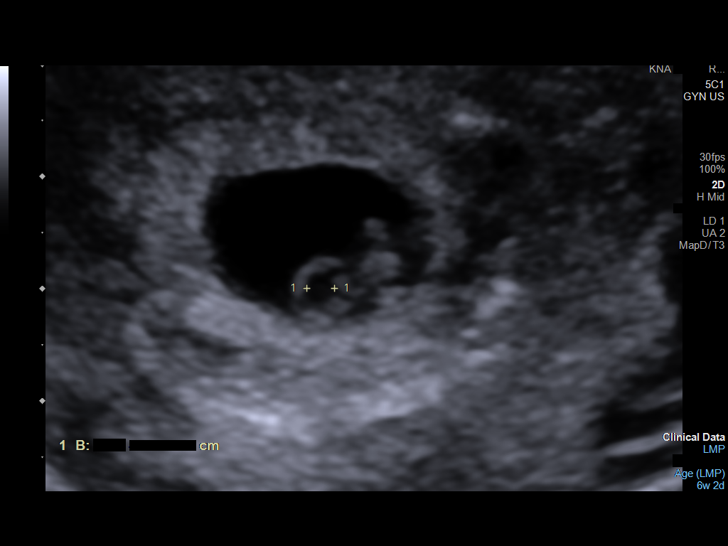
[im 77/77]
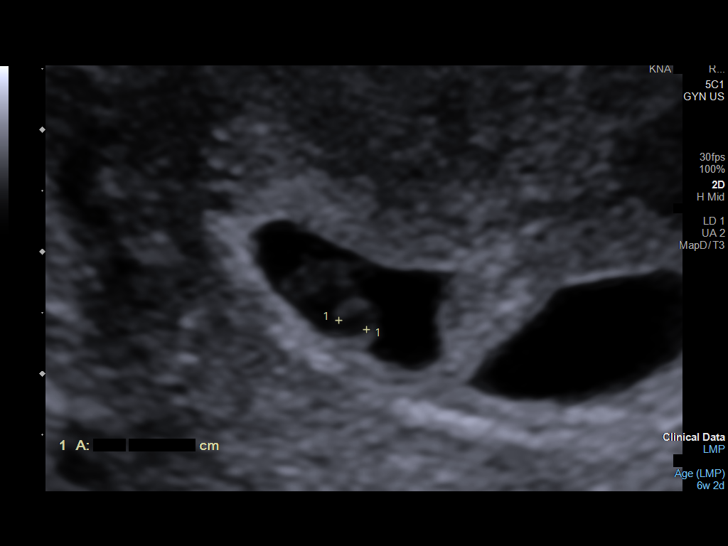

[14 of 28 positions shown; findings below may reference images not displayed]

FINDINGS: Number of IUPs:  2

Chorionicity/Amnionicity:  Dichorionic/diamniotic.

TWIN 1/A

Yolk sac:  Visualized.

Embryo:  Visualized.

Cardiac Activity: Visualized.

Heart Rate: 126 bpm

MSD: 16.7 mm   6 w   4 d

CRL:   7.2 mm   6 w 4 d                  US EDC: 06/26/2022

TWIN 2/B

Yolk sac:  Visualized.

Embryo:  Visualized.

Cardiac Activity: Visualized.

Heart Rate: 122 bpm

MSD: 22.3 mm   7 w   1 d

CRL:   10.4 mm   7 w 1 d                  US EDC: 06/17/2022

Subchorionic hemorrhage:  None visualized.

Maternal uterus/adnexae: Bilateral ovaries visualized and appear
within normal limits. No free fluid.
IMPRESSION: 1. Live intrauterine twin pregnancy, dichorionic diamniotic.
2. Twin A measures 6 weeks 4 days.  Twin B measures 7 weeks 1 day.
3. No acute maternal abnormality.

## 2022-02-10 ENCOUNTER — Other Ambulatory Visit: Payer: Medicaid Other

## 2022-02-10 ENCOUNTER — Encounter: Payer: Medicaid Other | Admitting: Obstetrics & Gynecology

## 2022-02-12 ENCOUNTER — Ambulatory Visit (INDEPENDENT_AMBULATORY_CARE_PROVIDER_SITE_OTHER): Payer: Medicaid Other

## 2022-02-12 ENCOUNTER — Encounter: Payer: Self-pay | Admitting: Obstetrics & Gynecology

## 2022-02-12 ENCOUNTER — Ambulatory Visit (INDEPENDENT_AMBULATORY_CARE_PROVIDER_SITE_OTHER): Payer: Medicaid Other | Admitting: Obstetrics & Gynecology

## 2022-02-12 VITALS — BP 123/78 | HR 87 | Wt 199.2 lb

## 2022-02-12 DIAGNOSIS — Z3A21 21 weeks gestation of pregnancy: Secondary | ICD-10-CM

## 2022-02-12 DIAGNOSIS — O0992 Supervision of high risk pregnancy, unspecified, second trimester: Secondary | ICD-10-CM

## 2022-02-12 DIAGNOSIS — O26899 Other specified pregnancy related conditions, unspecified trimester: Secondary | ICD-10-CM

## 2022-02-12 DIAGNOSIS — O30042 Twin pregnancy, dichorionic/diamniotic, second trimester: Secondary | ICD-10-CM

## 2022-02-12 LAB — POCT URINALYSIS DIPSTICK OB
Blood, UA: NEGATIVE
Glucose, UA: NEGATIVE
Ketones, UA: NEGATIVE
Leukocytes, UA: NEGATIVE
Nitrite, UA: NEGATIVE

## 2022-02-12 NOTE — Progress Notes (Signed)
? ?HIGH-RISK PREGNANCY VISIT ?Patient name: Monica Cox MRN [id number]  Date of birth: 1991/05/05 ?Chief Complaint:   ?Routine Prenatal Visit, High Risk Gestation, and Pregnancy Ultrasound ? ?History of Present Illness:   ?Monica Cox is a 31 y.o. [phone] female at [redacted]w[redacted]d with an Estimated Date of Delivery: 06/22/22 being seen today for ongoing management of a high-risk pregnancy complicated by DC/DA twins.   ? ?Today she reports no complaints.  ?Of note, seen in [facility]U due to bleeding that has since resolved ? ?Contractions: Not present. Vag. Bleeding: None.  Movement: Present. denies leaking of fluid.  ? ? ?  12/09/2021  ? 11:33 AM 06/25/2017  ?  3:34 PM  ?Depression screen PHQ 2/9  ?Decreased Interest 0 3  ?Down, Depressed, Hopeless 0 3  ?PHQ - 2 Score 0 6  ?Altered sleeping 1 3  ?Tired, decreased energy 0 3  ?Change in appetite 0 2  ?Feeling bad or failure about yourself  0 3  ?Trouble concentrating 0 2  ?Moving slowly or fidgety/restless 0 1  ?Suicidal thoughts 0 0  ?PHQ-9 Score 1 20  ?Difficult doing work/chores  Extremely dIfficult  ? ? ? ?Current Outpatient Medications  ?Medication Instructions  ? aspirin 162 mg, Oral, Daily, Swallow whole.  ? Blood Pressure Monitor MISC For regular home bp monitoring during pregnancy  ? Doxylamine-Pyridoxine (DICLEGIS) 10-10 MG TBEC 1 tablet, Oral, 2 times daily  ? escitalopram (LEXAPRO) 10 MG tablet TAKE 1 TABLET BY MOUTH EVERY DAY  ? Prenatal MV & Min w/FA-DHA (PRENATAL GUMMIES PO) Oral  ? promethazine (PHENERGAN) 25 mg, Oral, Every 6 hours PRN  ?  ? ?Review of Systems:   ?Pertinent items are noted in HPI ?Denies abnormal vaginal discharge w/ itching/odor/irritation, headaches, visual changes, shortness of breath, chest pain, abdominal pain, severe nausea/vomiting, or problems with urination or bowel movements unless otherwise stated above. ?Pertinent History Reviewed:  ?Reviewed past medical,surgical, social, obstetrical and family history.  ?Reviewed problem  list, medications and allergies. ?Physical Assessment:  ? ?Vitals:  ? 02/12/22 1624  ?BP: 123/78  ?Pulse: 87  ?Weight: 199 lb 3.2 oz (90.4 kg)  ?Body mass index is 35.29 kg/m?. ?     ?     Physical Examination:  ? General appearance: alert, well appearing, and in no distress ? Mental status: normal mood, behavior, speech, dress, motor activity, and thought processes ? Skin: warm & dry  ? Extremities: Edema: None  ?  Cardiovascular: normal heart rate noted ? Respiratory: normal respiratory effort, no distress ? Abdomen: gravid, soft, non-tender ? Pelvic: Cervical exam deferred        ? ?Fetal Status:     Movement: Present   ? ?Fetal Surveillance Testing today: anatomy scan ?[place] 21+3 wks,DC/DA TWINS, normal ovaries,cx 3.8 cm ?BABY A: boy,breech left,anterior marginal cord insertion with vasa previa,SVP of fluid 7.3 cm,FHR 142 bpm,EFW 416 g 39%,anatomy complete ?BABY B: girl,cephalic right,anterior placenta with a posterior accessory lobe,FHR 152 bpm,SVP of fluid 6.3 cm,EFW 437 g 53%,anatomy complete ?  ? ?Chaperone: N/A   ? ?Results for orders placed or performed in visit on 02/12/22 (from the past 24 hour(s))  ?POC Urinalysis Dipstick OB  ? Collection Time: 02/12/22  4:26 PM  ?Result Value Ref Range  ? Color, UA    ? Clarity, UA    ? Glucose, UA Negative Negative  ? Bilirubin, UA    ? Ketones, UA neg   ? Spec Grav, UA    ? Blood, UA neg   ?  pH, UA    ? POC,PROTEIN,UA Trace Negative, Trace, Small (1+), Moderate (2+), Large (3+), 4+  ? Urobilinogen, UA    ? Nitrite, UA neg   ? Leukocytes, UA Negative Negative  ? Appearance    ? Odor    ?  ? ?Assessment & Plan:  ?High-risk pregnancy: [id number] at [redacted]w[redacted]d with an Estimated Date of Delivery: 06/22/22  ? ?1) DC/DA twins ?-reviewed continue fetal surveillance ? ?2) Vasa previa ?-reviewed US findings, will continue to follow with growth ?-discussed if remains present, will require primary C-section ? ?3) Contraception ?-pt may consider tubal ligation if C-section ?-risk/benefit  reviewed, []  plan to complete at next visit ? ?Meds: No orders of the defined types were placed in this encounter. ? ? ?Labs/procedures today: growth scan ? ?Treatment Plan:  as outlined above ? ?Reviewed: Preterm labor symptoms and general obstetric precautions including but not limited to vaginal bleeding, contractions, leaking of fluid and fetal movement were reviewed in detail with the patient.  All questions were answered. ? ?Follow-up: Return in about 4 weeks (around 03/12/2022) for HROB visit and growth scan (di/di twins). ? ? ?Future Appointments  ?Date Time Provider Department Center  ?03/14/2022 11:30 AM [facility] 05/14/2022 [facility] None  ?03/14/2022  1:10 PM 05/14/2022, DO [facility]  ? ? ?Orders Placed This Encounter  ?Procedures  ? POC Urinalysis Dipstick OB  ? ? ?Myna Hidalgo, DO ?Attending Obstetrician & Gynecologist, Faculty Practice ?Center for Myna Hidalgo, [facility] ? ? ? ?

## 2022-02-12 NOTE — Progress Notes (Signed)
[place] 21+3 wks,DC/DA TWINS, normal ovaries,cx 3.8 cm ?BABY A: boy,breech left,anterior marginal cord insertion with vasa previa,SVP of fluid 7.3 cm,FHR 142 bpm,EFW 416 g 39%,anatomy complete ?BABY B: girl,cephalic right,anterior placenta with a posterior accessory lobe,FHR 152 bpm,SVP of fluid 6.3 cm,EFW 437 g 53%,anatomy complete ?

## 2022-03-03 IMAGING — US US OB < 14 WEEKS - US OB TV
1 series · 13 of 28 positions shown · non-contrast
Comparison: None.

CLINICAL DATA: Twin pregnancy. Assigned gestational age 10 weeks, 4
days. Vaginal bleeding.

EXAM:
TWIN OBSTETRIC <14WK US AND TRANSVAGINAL OB US
TECHNIQUE: Both transabdominal and transvaginal ultrasound examinations were
performed for complete evaluation of the gestation as well as the
maternal uterus, adnexal regions, and pelvic cul-de-sac.
Transvaginal technique was performed to assess early pregnancy.

[Series 1: us ob less than 14 weeks with ob transvaginal · 13 of 85 slices shown]
[im 4/85]
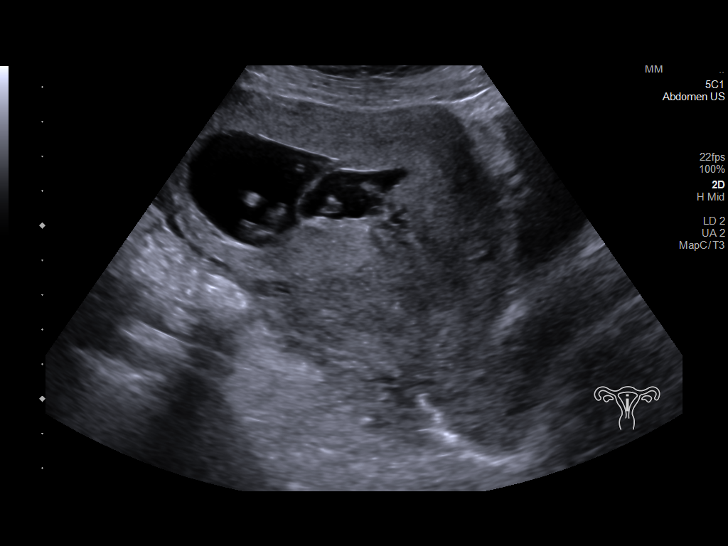
[im 10/85]
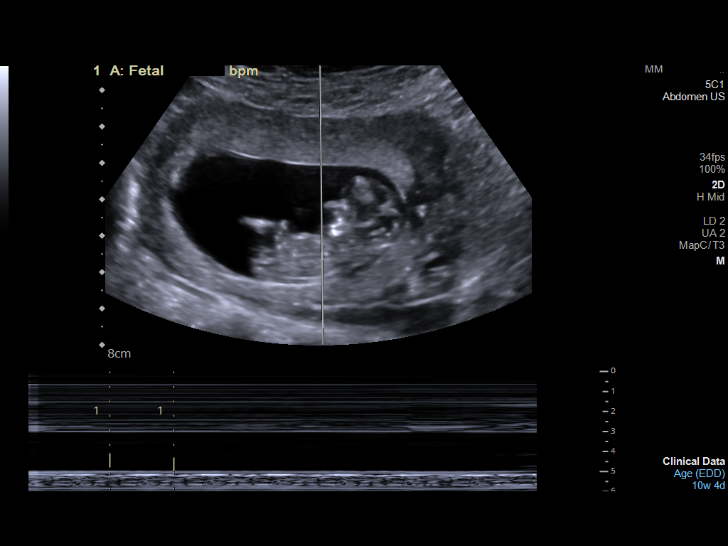
[im 16/85]
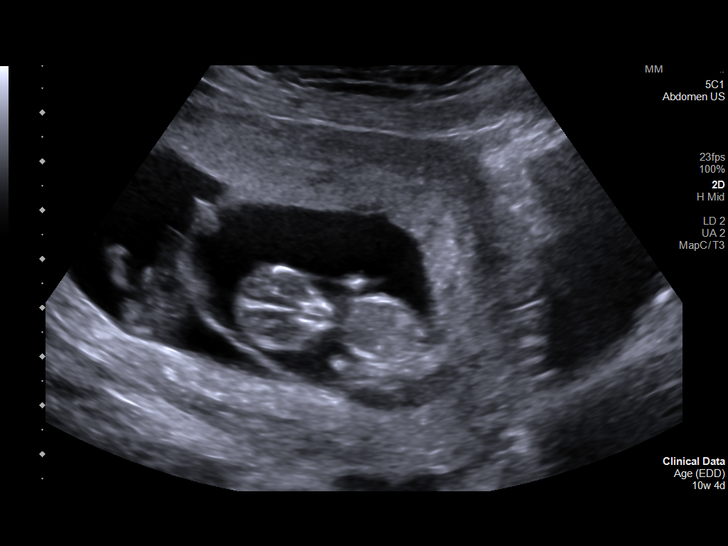
[im 22/85]
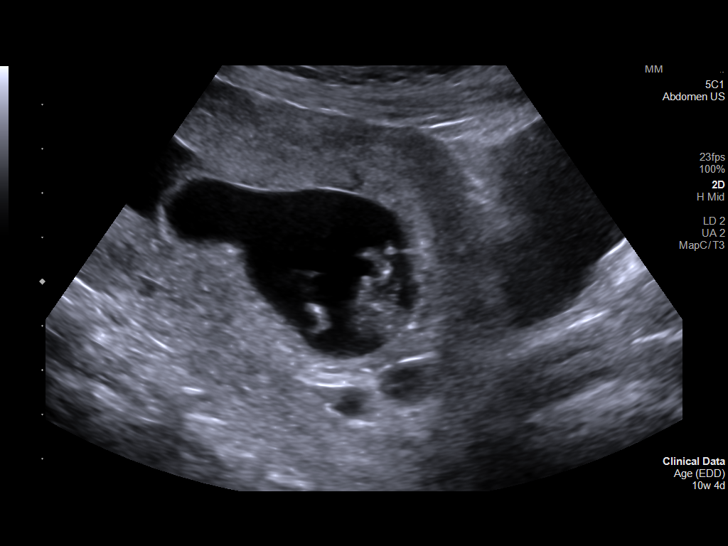
[im 29/85]
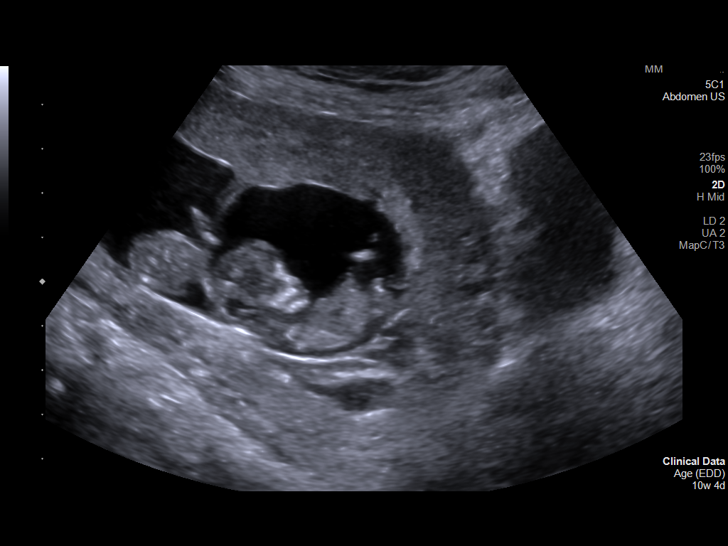
[im 35/85]
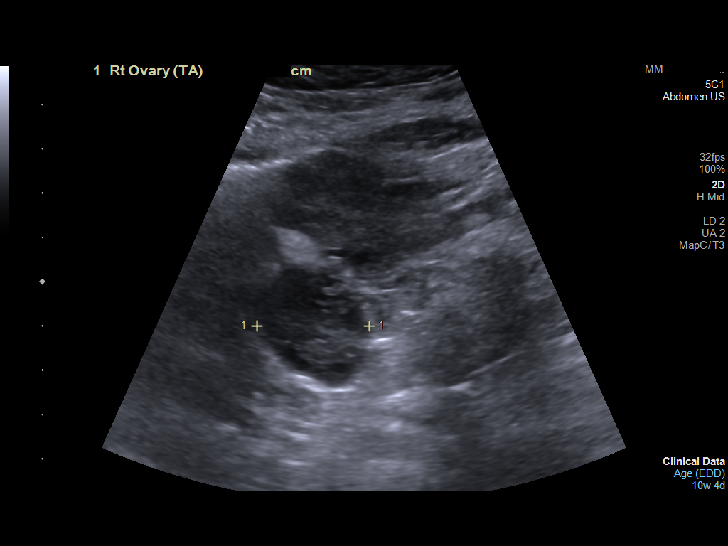
[im 44/85]
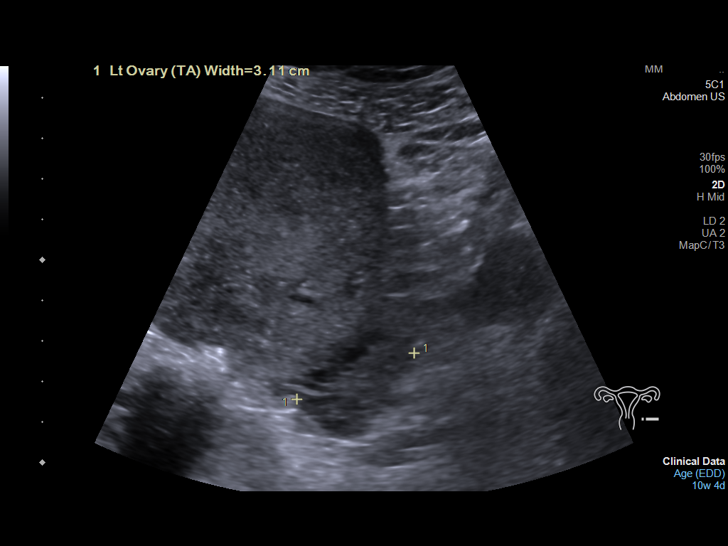
[im 50/85]
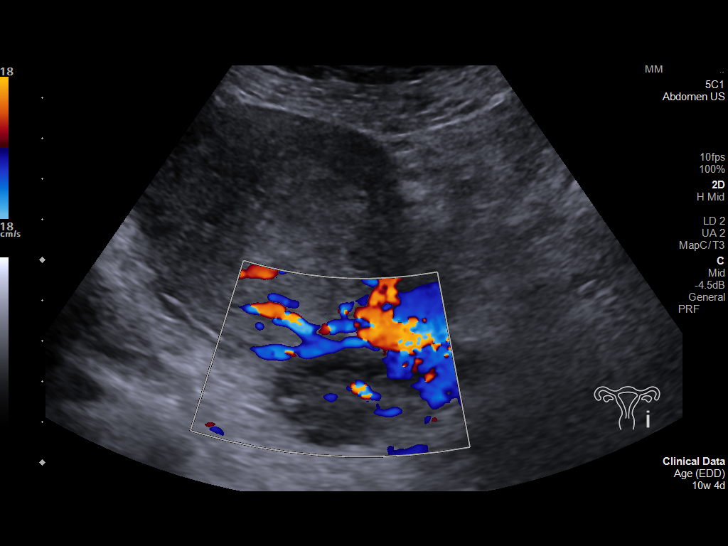
[im 57/85]
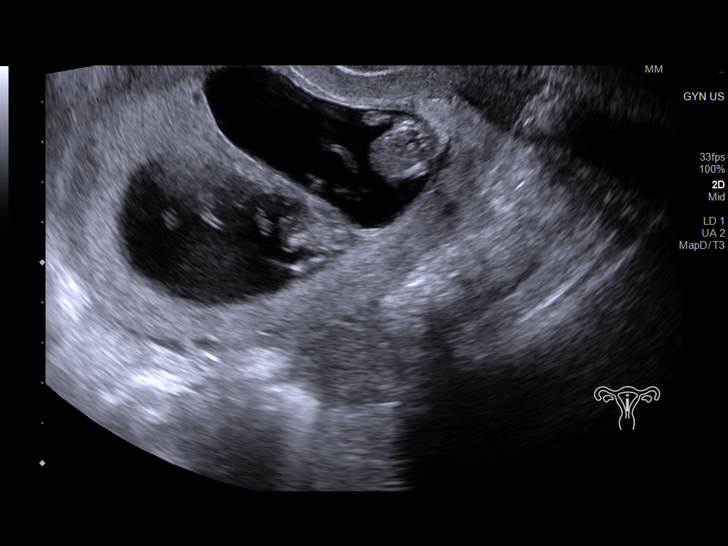
[im 63/85]
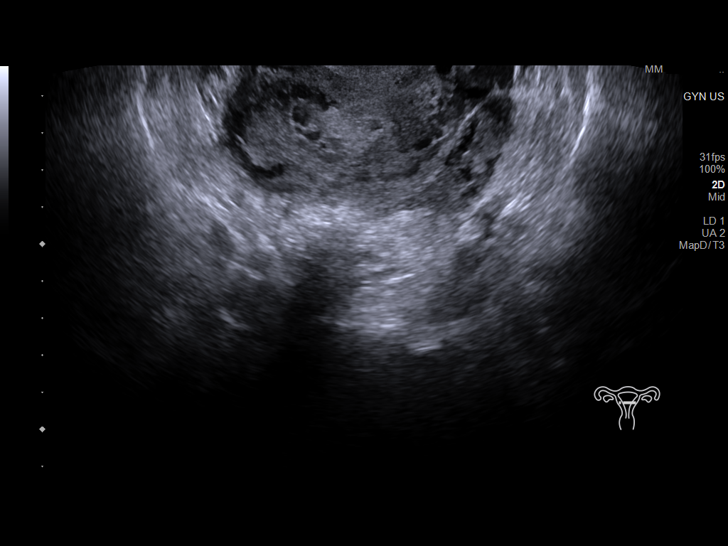
[im 69/85]
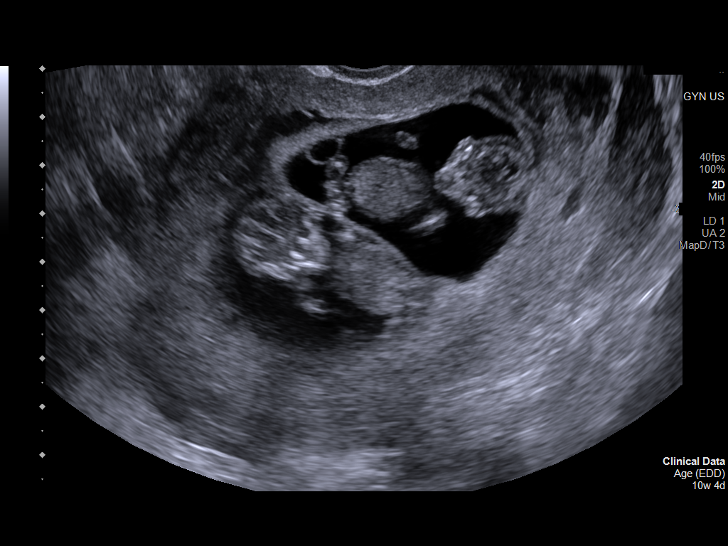
[im 75/85]
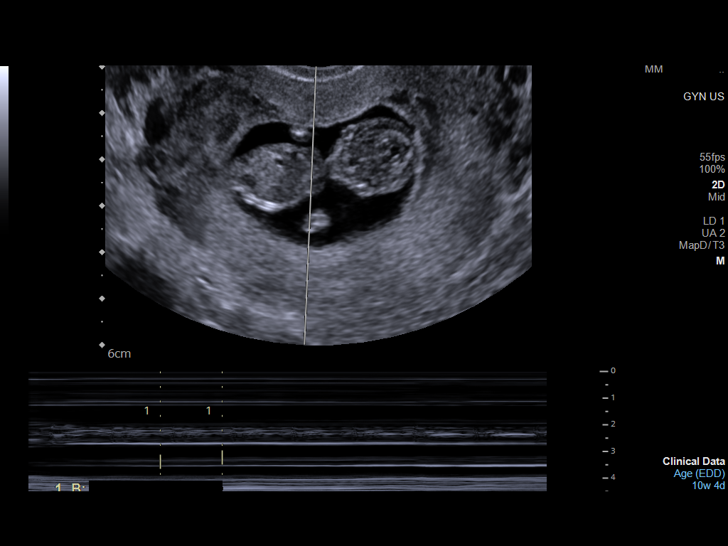
[im 81/85]
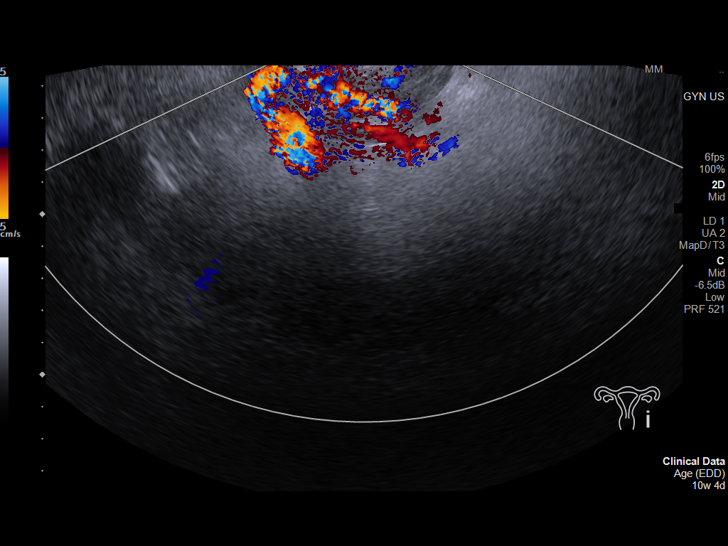

[13 of 28 positions shown; findings below may reference images not displayed]

FINDINGS: Number of IUPs:  2

Chorionicity/Amnionicity:  Dichorionic-diamniotic (thick membrane)

TWIN 1

Yolk sac:  Not visualized

Embryo:  Present, single

Cardiac Activity: Present, regular

Heart Rate: 162 bpm

MSD: Appropriate given fetal size

CRL:  45 mm   11 w 2 d                  US EDC: 06/21/2022

TWIN 2

Yolk sac:  Present, single, normal appearing

Embryo:  Present, single

Cardiac Activity: Present, regular

Heart Rate: 166 bpm

MSD: Appropriate given fetal size

CRL:  42 mm   11 w 1 d                  US EDC: 06/22/2022

Subchorionic hemorrhage: Small subchorionic hemorrhage noted, best
appreciated anteriorly on transvaginal sonography

Maternal uterus/adnexae: The uterus is anteverted. No intrauterine
masses are seen. The cervix is closed and is unremarkable. The
maternal ovaries are unremarkable.
IMPRESSION: Living dichorionic diamniotic twin gestation with appropriate
interval growth since prior examination. EDC (based on examination
of 11/04/2021) of 06/26/2022.

Small subchorionic hemorrhage.

## 2022-03-06 DIAGNOSIS — Z419 Encounter for procedure for purposes other than remedying health state, unspecified: Secondary | ICD-10-CM | POA: Diagnosis not present

## 2022-03-07 ENCOUNTER — Telehealth: Payer: Self-pay | Admitting: Clinical

## 2022-03-07 NOTE — Telephone Encounter (Signed)
Attempt to call regarding referral; Left HIPPA-compliant message to call back Tannon Peerson from [facility] at  [phone] (Azarel Banner's office).   

## 2022-03-12 ENCOUNTER — Other Ambulatory Visit: Payer: Self-pay | Admitting: Obstetrics & Gynecology

## 2022-03-12 DIAGNOSIS — O30042 Twin pregnancy, dichorionic/diamniotic, second trimester: Secondary | ICD-10-CM

## 2022-03-12 NOTE — BH Specialist Note (Signed)
Pt did not arrive to video visit and did not answer the phone; Left HIPPA-compliant message to call back Dellamae Rosamilia from [facility] at  [phone] (Makeyla Govan's office).  ?; left MyChart message for patient.  ? ?

## 2022-03-14 ENCOUNTER — Ambulatory Visit (INDEPENDENT_AMBULATORY_CARE_PROVIDER_SITE_OTHER): Payer: Medicaid Other

## 2022-03-14 ENCOUNTER — Ambulatory Visit (INDEPENDENT_AMBULATORY_CARE_PROVIDER_SITE_OTHER): Payer: Medicaid Other | Admitting: Obstetrics & Gynecology

## 2022-03-14 ENCOUNTER — Encounter: Payer: Self-pay | Admitting: Obstetrics & Gynecology

## 2022-03-14 VITALS — BP 119/73 | HR 90 | Wt 207.4 lb

## 2022-03-14 DIAGNOSIS — Z3A25 25 weeks gestation of pregnancy: Secondary | ICD-10-CM | POA: Diagnosis not present

## 2022-03-14 DIAGNOSIS — O30042 Twin pregnancy, dichorionic/diamniotic, second trimester: Secondary | ICD-10-CM

## 2022-03-14 DIAGNOSIS — O26899 Other specified pregnancy related conditions, unspecified trimester: Secondary | ICD-10-CM

## 2022-03-14 DIAGNOSIS — O0992 Supervision of high risk pregnancy, unspecified, second trimester: Secondary | ICD-10-CM

## 2022-03-14 NOTE — Progress Notes (Signed)
[place] DC/DA TWINS 25+5 wks,cx 2.8 cm BABY A: female,cephalic left,anterior placenta gr 1,marginal cord insertion,vasa previa,FHR 157 bpm,SVP of fluid 8.1 cm,EFW 885 g 53% BABY B: female,breech right superior,anterior/funda placenta with posterior accessory lobe gr 1,FHR 150 bpm,SVP of fluid 5.9 cm,EFW 880 g 51%,discordance .6 %

## 2022-03-14 NOTE — Progress Notes (Unsigned)
HIGH-RISK PREGNANCY VISIT Patient name: Monica Cox MRN [id number]  Date of birth: 1991/05/06 Chief Complaint:   High Risk Gestation ([place] today)  History of Present Illness:   Monica Cox is a 31 y.o. [phone] female at [redacted]w[redacted]d with an Estimated Date of Delivery: 06/22/22 being seen today for ongoing management of a high-risk pregnancy complicated by DC/DA twins Vasa previa O neg.    Today she reports no complaints.   Contractions: Irritability. Vag. Bleeding: None.  Movement: Present. denies leaking of fluid.      12/09/2021   11:33 AM 06/25/2017    3:34 PM  Depression screen PHQ 2/9  Decreased Interest 0 3  Down, Depressed, Hopeless 0 3  PHQ - 2 Score 0 6  Altered sleeping 1 3  Tired, decreased energy 0 3  Change in appetite 0 2  Feeling bad or failure about yourself  0 3  Trouble concentrating 0 2  Moving slowly or fidgety/restless 0 1  Suicidal thoughts 0 0  PHQ-9 Score 1 20  Difficult doing work/chores  Extremely dIfficult     Current Outpatient Medications  Medication Instructions   aspirin EC 162 mg, Oral, Daily, Swallow whole.   Blood Pressure Monitor MISC For regular home bp monitoring during pregnancy   escitalopram (LEXAPRO) 10 MG tablet TAKE 1 TABLET BY MOUTH EVERY DAY   Prenatal MV & Min w/FA-DHA (PRENATAL GUMMIES PO) Oral   promethazine (PHENERGAN) 25 mg, Oral, Every 6 hours PRN     Review of Systems:   Pertinent items are noted in HPI Denies abnormal vaginal discharge w/ itching/odor/irritation, headaches, visual changes, shortness of breath, chest pain, abdominal pain, severe nausea/vomiting, or problems with urination or bowel movements unless otherwise stated above. Pertinent History Reviewed:  Reviewed past medical,surgical, social, obstetrical and family history.  Reviewed problem list, medications and allergies. Physical Assessment:   Vitals:   03/14/22 1248  BP: 119/73  Pulse: 90  Weight: 207 lb 6.4 oz (94.1 kg)  Body mass  index is 36.74 kg/m.           Physical Examination:   General appearance: alert, well appearing, and in no distress  Mental status: normal mood, behavior, speech, dress, motor activity, and thought processes  Skin: warm & dry   Extremities: Edema: Trace    Cardiovascular: normal heart rate noted  Respiratory: normal respiratory effort, no distress  Abdomen: gravid, soft, non-tender  Pelvic: Cervical exam deferred         Fetal Status:     Movement: Present    Fetal Surveillance Testing today: growth scan- [place] DC/DA TWINS 25+5 wks,cx 2.8 cm BABY A: female,cephalic left,anterior placenta gr 1,marginal cord insertion,vasa previa,FHR 157 bpm,SVP of fluid 8.1 cm,EFW 885 g 53% BABY B: female,breech right superior,anterior/funda placenta with posterior accessory lobe gr 1,FHR 150 bpm,SVP of fluid 5.9 cm,EFW 880 g 51%,discordance .6 %  Chaperone: N/A    No results found for this or any previous visit (from the past 24 hour(s)).   Assessment & Plan:  High-risk pregnancy: [id number] at [redacted]w[redacted]d with an Estimated Date of Delivery: 06/22/22   1) DC/DA twins with vasa previa -reviewed US findings -continue with growth q 4wks -delivery @ 34wks via C-section  2) Contraceptive -discussed options, she is considering tubal ligation -reviewed risk/benefit and indications.  Pt is not 100% sure, but wanted to complete consent form in case she does desire to proceed -Inform consent obtained  3) h/o preeclampsia -encouraged compliance with aspirin  Meds: No orders  of the defined types were placed in this encounter.   Labs/procedures today: growth scan  Treatment Plan:  as outlined above, []  next visit, PN2 and RhoGAM  Reviewed: Preterm labor symptoms and general obstetric precautions including but not limited to vaginal bleeding, contractions, leaking of fluid and fetal movement were reviewed in detail with the patient.  All questions were answered. Pt has home bp cuff. Check bp weekly, let know if  >140/90.   Follow-up: Return in about 3 weeks (around 04/04/2022) for HROB visit, PN-2 and growth (DC/DA twins).   Future Appointments  Date Time Provider Department Center  03/14/2022  1:10 PM 05/14/2022, DO [facility]  03/26/2022  2:45 PM [facility]    No orders of the defined types were placed in this encounter.   [facility] DO Attending Obstetrician & Gynecologist, [facility] for [facility]

## 2022-03-18 ENCOUNTER — Encounter: Payer: Self-pay | Admitting: Obstetrics & Gynecology

## 2022-03-20 ENCOUNTER — Other Ambulatory Visit: Payer: Self-pay | Admitting: Obstetrics & Gynecology

## 2022-03-26 ENCOUNTER — Ambulatory Visit: Payer: Medicaid Other | Admitting: Clinical

## 2022-03-26 DIAGNOSIS — Z91199 Patient's noncompliance with other medical treatment and regimen due to unspecified reason: Secondary | ICD-10-CM

## 2022-04-02 ENCOUNTER — Encounter: Payer: Self-pay | Admitting: Obstetrics & Gynecology

## 2022-04-02 ENCOUNTER — Ambulatory Visit (INDEPENDENT_AMBULATORY_CARE_PROVIDER_SITE_OTHER): Payer: Medicaid Other | Admitting: Obstetrics & Gynecology

## 2022-04-02 VITALS — BP 105/64 | HR 91 | Wt 203.8 lb

## 2022-04-02 DIAGNOSIS — O30042 Twin pregnancy, dichorionic/diamniotic, second trimester: Secondary | ICD-10-CM

## 2022-04-02 DIAGNOSIS — O0993 Supervision of high risk pregnancy, unspecified, third trimester: Secondary | ICD-10-CM

## 2022-04-02 NOTE — Progress Notes (Signed)
HIGH-RISK PREGNANCY VISIT Patient name: Monica Cox MRN [id number]  Date of birth: 26-May-1991 Chief Complaint:   OB visit  History of Present Illness:   INDONESIA Cox is a 31 y.o. [id number] female at [redacted]w[redacted]d with an Estimated Date of Delivery: 06/22/22 being seen today for ongoing management of a high-risk pregnancy complicated by -DC/DA twins -Vasa previa -O neg    Today she reports no complaints.   Contractions: Irritability. Vag. Bleeding: None.  Movement: Present. denies leaking of fluid.      12/09/2021   11:33 AM 06/25/2017    3:34 PM  Depression screen PHQ 2/9  Decreased Interest 0 3  Down, Depressed, Hopeless 0 3  PHQ - 2 Score 0 6  Altered sleeping 1 3  Tired, decreased energy 0 3  Change in appetite 0 2  Feeling bad or failure about yourself  0 3  Trouble concentrating 0 2  Moving slowly or fidgety/restless 0 1  Suicidal thoughts 0 0  PHQ-9 Score 1 20  Difficult doing work/chores  Extremely dIfficult     Current Outpatient Medications  Medication Instructions   aspirin EC 162 mg, Oral, Daily, Swallow whole.   Blood Pressure Monitor MISC For regular home bp monitoring during pregnancy   escitalopram (LEXAPRO) 10 MG tablet TAKE 1 TABLET BY MOUTH EVERY DAY   Prenatal MV & Min w/FA-DHA (PRENATAL GUMMIES PO) Oral   promethazine (PHENERGAN) 25 MG tablet TAKE 1 TABLET BY MOUTH EVERY 6 HOURS AS NEEDED FOR NAUSEA OR VOMITING.     Review of Systems:   Pertinent items are noted in HPI Denies abnormal vaginal discharge w/ itching/odor/irritation, headaches, visual changes, shortness of breath, chest pain, abdominal pain, severe nausea/vomiting, or problems with urination or bowel movements unless otherwise stated above. Pertinent History Reviewed:  Reviewed past medical,surgical, social, obstetrical and family history.  Reviewed problem list, medications and allergies. Physical Assessment:   Vitals:   04/02/22 1603  BP: 105/64  Pulse: 91  Weight: 203 lb  12.8 oz (92.4 kg)  Body mass index is 36.1 kg/m.           Physical Examination:   General appearance: alert, well appearing, and in no distress and oriented to person, place, and time  Mental status: normal mood, behavior, speech, dress, motor activity, and thought processes  Skin: warm & dry   Extremities: Edema: Trace    Cardiovascular: normal heart rate noted  Respiratory: normal respiratory effort, no distress  Abdomen: gravid, soft, non-tender  Pelvic: Cervical exam deferred         Fetal Status:     Movement: Present    Fetal Surveillance Testing today: 135/140   Chaperone: N/A    No results found for this or any previous visit (from the past 24 hour(s)).   Assessment & Plan:  High-risk pregnancy: [id number] at [redacted]w[redacted]d with an Estimated Date of Delivery: 06/22/22   1) DC/DA twins with Vasa previa -reviewed plan for inpatient monitoring at 32wks -plan for scheduled C-section @ 34wk- referral created -reviewed inpt expectations- including BMZ and NICU consult -Reviewed precautions -next [place] scheduled for 7/3  2) OB care -PN2 scheduled for tomorrow followed by RhoGAM shot -continue q 2wk visits  Meds: No orders of the defined types were placed in this encounter.   Labs/procedures today: none  Treatment Plan:  as outlined above  Reviewed: Preterm labor symptoms and general obstetric precautions including but not limited to vaginal bleeding, contractions, leaking of fluid and fetal movement were reviewed  in detail with the patient.  All questions were answered.   Follow-up: Return for PN-2 next available, next appt as scheduled and schedule appt July 17week with Jailee Jaquez.   Future Appointments  Date Time Provider Department Center  04/03/2022  8:50 AM [facility]  04/07/2022  3:00 PM [facility] [place] [facility] None  04/07/2022  4:30 PM Cheral Marker, CNM [facility]  04/21/2022  4:10 PM Myna Hidalgo, DO [facility]    No orders of the defined  types were placed in this encounter.   Myna Hidalgo, DO Attending Obstetrician & Gynecologist, [facility] for Monica Cox, [facility]

## 2022-04-02 NOTE — Patient Instructions (Signed)
July 23- plan for direct admission into [facility] specialty care August 6- plan for scheduled C-section (if all goes to plan)

## 2022-04-03 ENCOUNTER — Other Ambulatory Visit: Payer: Medicaid Other

## 2022-04-03 DIAGNOSIS — Z3A28 28 weeks gestation of pregnancy: Secondary | ICD-10-CM | POA: Diagnosis not present

## 2022-04-03 DIAGNOSIS — O30043 Twin pregnancy, dichorionic/diamniotic, third trimester: Secondary | ICD-10-CM

## 2022-04-03 DIAGNOSIS — O0993 Supervision of high risk pregnancy, unspecified, third trimester: Secondary | ICD-10-CM

## 2022-04-03 DIAGNOSIS — Z131 Encounter for screening for diabetes mellitus: Secondary | ICD-10-CM

## 2022-04-04 ENCOUNTER — Other Ambulatory Visit: Payer: Self-pay | Admitting: Obstetrics & Gynecology

## 2022-04-04 DIAGNOSIS — O30042 Twin pregnancy, dichorionic/diamniotic, second trimester: Secondary | ICD-10-CM

## 2022-04-04 DIAGNOSIS — O99012 Anemia complicating pregnancy, second trimester: Secondary | ICD-10-CM

## 2022-04-04 LAB — HIV ANTIBODY (ROUTINE TESTING W REFLEX): HIV Screen 4th Generation wRfx: NONREACTIVE

## 2022-04-04 LAB — CBC
Hematocrit: 28.1 % — ABNORMAL LOW (ref 34.0–46.6)
Hemoglobin: 9.1 g/dL — ABNORMAL LOW (ref 11.1–15.9)
MCH: 28.5 pg (ref 26.6–33.0)
MCHC: 32.4 g/dL (ref 31.5–35.7)
MCV: 88 fL (ref 79–97)
Platelets: 277 10*3/uL (ref 150–450)
RBC: 3.19 x10E6/uL — ABNORMAL LOW (ref 3.77–5.28)
RDW: 12.4 % (ref 11.7–15.4)
WBC: 6.4 10*3/uL (ref 3.4–10.8)

## 2022-04-04 LAB — ANTIBODY SCREEN: Antibody Screen: NEGATIVE

## 2022-04-04 LAB — RPR: RPR Ser Ql: NONREACTIVE

## 2022-04-04 LAB — GLUCOSE TOLERANCE, 2 HOURS W/ 1HR
Glucose, 1 hour: 130 mg/dL (ref 70–179)
Glucose, 2 hour: 128 mg/dL (ref 70–152)
Glucose, Fasting: 71 mg/dL (ref 70–91)

## 2022-04-04 MED ORDER — FERROUS GLUCONATE 324 (38 FE) MG PO TABS: 324.0000 mg | ORAL_TABLET | ORAL | 6 refills | Status: AC

## 2022-04-04 NOTE — Progress Notes (Signed)
Rx for iron sent in

## 2022-04-05 DIAGNOSIS — Z419 Encounter for procedure for purposes other than remedying health state, unspecified: Secondary | ICD-10-CM | POA: Diagnosis not present

## 2022-04-07 ENCOUNTER — Ambulatory Visit (INDEPENDENT_AMBULATORY_CARE_PROVIDER_SITE_OTHER): Payer: Medicaid Other

## 2022-04-07 ENCOUNTER — Ambulatory Visit (INDEPENDENT_AMBULATORY_CARE_PROVIDER_SITE_OTHER): Payer: Medicaid Other | Admitting: Women's Health

## 2022-04-07 ENCOUNTER — Encounter: Payer: Self-pay | Admitting: Women's Health

## 2022-04-07 ENCOUNTER — Other Ambulatory Visit: Payer: Medicaid Other

## 2022-04-07 VITALS — BP 121/80 | HR 93 | Wt 204.8 lb

## 2022-04-07 DIAGNOSIS — O0993 Supervision of high risk pregnancy, unspecified, third trimester: Secondary | ICD-10-CM

## 2022-04-07 DIAGNOSIS — O30042 Twin pregnancy, dichorionic/diamniotic, second trimester: Secondary | ICD-10-CM

## 2022-04-07 DIAGNOSIS — Z23 Encounter for immunization: Secondary | ICD-10-CM

## 2022-04-07 DIAGNOSIS — Z3A29 29 weeks gestation of pregnancy: Secondary | ICD-10-CM

## 2022-04-07 DIAGNOSIS — Z6791 Unspecified blood type, Rh negative: Secondary | ICD-10-CM

## 2022-04-07 MED ORDER — PANTOPRAZOLE SODIUM 20 MG PO TBEC: 20.0000 mg | DELAYED_RELEASE_TABLET | Freq: Every day | ORAL | 2 refills | Status: DC

## 2022-04-07 NOTE — Progress Notes (Signed)
[place] 29+1 wks,DC/DA twins,cx 3.1 cm,normal ovaries BABY A:boy,cephalic,anterior placenta gr 3,marginal cord insertion,vasa previa,SVP of fluid 5.7 cm,FHR 140 bpm,EFW 1379 g 45% BABY B:girl,transverse head right,anterior fundal placenta gr 3,posterior accessory lobe,SVP of fluid 5 cm,FHR 140 bpm,EFW 1413 g 52%,discordance 2.4%

## 2022-04-07 NOTE — Patient Instructions (Signed)
Monica Cox, thank you for choosing our office today! We appreciate the opportunity to meet your healthcare needs. You may receive a short survey by mail, e-mail, or through [employer]. If you are happy with your care we would appreciate if you could take just a few minutes to complete the survey questions. We read all of your comments and take your feedback very seriously. Thank you again for choosing our office.  Center for [employer] Team at [facility]  [facility] at [facility] ([address] [place], [place] [address]) Entrance C, located off of [address] Free 24/7 valet parking   CLASSES: Go to [email or web address] to register for classes (childbirth, breastfeeding, waterbirth, infant CPR, daddy bootcamp, etc.)  Call the office [phone]) or go to [facility] if: You begin to have strong, frequent contractions Your water breaks.  Sometimes it is a big gush of fluid, sometimes it is just a trickle that keeps getting your panties wet or running down your legs You have vaginal bleeding.  It is normal to have a small amount of spotting if your cervix was checked.  You don't feel your baby moving like normal.  If you don't, get you something to eat and drink and lay down and focus on feeling your baby move.   If your baby is still not moving like normal, you should call the office or go to [facility]  Call the office [phone]) or go to [facility] hospital for these signs of pre-eclampsia: Severe headache that does not go away with Tylenol Visual changes- seeing spots, double, blurred vision Pain under your right breast or upper abdomen that does not go away with Tums or heartburn medicine Nausea and/or vomiting Severe swelling in your hands, feet, and face   Tdap Vaccine It is recommended that you get the Tdap vaccine during the third trimester of EACH pregnancy to help protect your baby from getting pertussis (whooping cough) 27-36 weeks is the BEST time to do  this so that you can pass the protection on to your baby. During pregnancy is better than after pregnancy, but if you are unable to get it during pregnancy it will be offered at the hospital.  You can get this vaccine with [place], at the health department, your family doctor, or some local pharmacies Everyone who will be around your baby should also be up-to-date on their vaccines before the baby comes. Adults (who are not pregnant) only need 1 dose of Tdap during adulthood.   [facility] Pediatricians/Family Doctors [facility]): [address] Colette Ribas, [phone]           [facility]: [address], [phone]                [facility] Medicine [facility]): [address], [phone] (call to ask if accepting patients) [facility] Department: [address], [place], [phone]    [facility] Pediatricians/Family Doctors [facility]): [phone], [phone] [facility]: [address] [place], [phone] [facility]: [address], [phone]  [facility] Doctors  [facility]): [phone] [facility]: [address] [phone]   [facility] Doctors [facility]: [address] [phone]  [facility] Family Doctors  [facility]: [phone], [phone]  Home Blood Pressure Monitoring for Patients   Your provider has recommended that you check your  blood pressure (BP) at least once a week at home. If you do not have a blood pressure cuff at home, one will be provided for you. Contact your provider if you have not received your monitor within 1 week.   Helpful Tips for Accurate Home Blood Pressure Checks  Don't smoke, exercise, or drink caffeine 30 minutes before checking your BP Use the restroom before checking your BP (a full bladder can raise your  pressure) Relax in a comfortable upright chair Feet on the ground Left arm resting comfortably on a flat surface at the level of your heart Legs uncrossed Back supported Sit quietly and don't talk Place the cuff on your bare arm Adjust snuggly, so that only two fingertips can fit between your skin and the top of the cuff Check 2 readings separated by at least one minute Keep a log of your BP readings For a visual, please reference this diagram: http://ccnc.care/bpdiagram  Provider Name: [facility]     Phone: [phone]  Zone 1: ALL CLEAR  Continue to monitor your symptoms:  BP reading is less than 140 (top number) or less than 90 (bottom number)  No right upper stomach pain No headaches or seeing spots No feeling nauseated or throwing up No swelling in face and hands  Zone 2: CAUTION Call your doctor's office for any of the following:  BP reading is greater than 140 (top number) or greater than 90 (bottom number)  Stomach pain under your ribs in the middle or right side Headaches or seeing spots Feeling nauseated or throwing up Swelling in face and hands  Zone 3: EMERGENCY  Seek immediate medical care if you have any of the following:  BP reading is greater than160 (top number) or greater than 110 (bottom number) Severe headaches not improving with Tylenol Serious difficulty catching your breath Any worsening symptoms from Zone 2   Third Trimester of Pregnancy The third trimester is from week 29 through week 42, months 7 through 9. The third trimester is a time when the fetus is growing rapidly. At the end of the ninth month, the fetus is about 20 inches in length and weighs 6-10 pounds.  BODY CHANGES Your body goes through many changes during pregnancy. The changes vary from woman to woman.  Your weight will continue to increase. You can expect to gain 25-35 pounds (11-16 kg) by the end of the pregnancy. You may begin to get stretch marks on your hips, abdomen,  and breasts. You may urinate more often because the fetus is moving lower into your pelvis and pressing on your bladder. You may develop or continue to have heartburn as a result of your pregnancy. You may develop constipation because certain hormones are causing the muscles that push waste through your intestines to slow down. You may develop hemorrhoids or swollen, bulging veins (varicose veins). You may have pelvic pain because of the weight gain and pregnancy hormones relaxing your joints between the bones in your pelvis. Backaches may result from overexertion of the muscles supporting your posture. You may have changes in your hair. These can include thickening of your hair, rapid growth, and changes in texture. Some women also have hair loss during or after pregnancy, or hair that feels dry or thin. Your hair will most likely return to normal after your baby is born. Your breasts will continue to grow and be tender. A yellow discharge may leak from your breasts called colostrum. Your belly button may stick out. You may   feel short of breath because of your expanding uterus. You may notice the fetus "dropping," or moving lower in your abdomen. You may have a bloody mucus discharge. This usually occurs a few days to a week before labor begins. Your cervix becomes thin and soft (effaced) near your due date. WHAT TO EXPECT AT YOUR PRENATAL EXAMS  You will have prenatal exams every 2 weeks until week 36. Then, you will have weekly prenatal exams. During a routine prenatal visit: You will be weighed to make sure you and the fetus are growing normally. Your blood pressure is taken. Your abdomen will be measured to track your baby's growth. The fetal heartbeat will be listened to. Any test results from the previous visit will be discussed. You may have a cervical check near your due date to see if you have effaced. At around 36 weeks, your caregiver will check your cervix. At the same time, your  caregiver will also perform a test on the secretions of the vaginal tissue. This test is to determine if a type of bacteria, Group B streptococcus, is present. Your caregiver will explain this further. Your caregiver may ask you: What your birth plan is. How you are feeling. If you are feeling the baby move. If you have had any abnormal symptoms, such as leaking fluid, bleeding, severe headaches, or abdominal cramping. If you have any questions. Other tests or screenings that may be performed during your third trimester include: Blood tests that check for low iron levels (anemia). Fetal testing to check the health, activity level, and growth of the fetus. Testing is done if you have certain medical conditions or if there are problems during the pregnancy. FALSE LABOR You may feel small, irregular contractions that eventually go away. These are called Braxton Hicks contractions, or false labor. Contractions may last for hours, days, or even weeks before true labor sets in. If contractions come at regular intervals, intensify, or become painful, it is best to be seen by your caregiver.  SIGNS OF LABOR  Menstrual-like cramps. Contractions that are 5 minutes apart or less. Contractions that start on the top of the uterus and spread down to the lower abdomen and back. A sense of increased pelvic pressure or back pain. A watery or bloody mucus discharge that comes from the vagina. If you have any of these signs before the 37th week of pregnancy, call your caregiver right away. You need to go to the hospital to get checked immediately. HOME CARE INSTRUCTIONS  Avoid all smoking, herbs, alcohol, and unprescribed drugs. These chemicals affect the formation and growth of the baby. Follow your caregiver's instructions regarding medicine use. There are medicines that are either safe or unsafe to take during pregnancy. Exercise only as directed by your caregiver. Experiencing uterine cramps is a good sign to  stop exercising. Continue to eat regular, healthy meals. Wear a good support bra for breast tenderness. Do not use hot tubs, steam rooms, or saunas. Wear your seat belt at all times when driving. Avoid raw meat, uncooked cheese, cat litter boxes, and soil used by cats. These carry germs that can cause birth defects in the baby. Take your prenatal vitamins. Try taking a stool softener (if your caregiver approves) if you develop constipation. Eat more high-fiber foods, such as fresh vegetables or fruit and whole grains. Drink plenty of fluids to keep your urine clear or pale yellow. Take warm sitz baths to soothe any pain or discomfort caused by hemorrhoids. Use hemorrhoid cream if   your caregiver approves. If you develop varicose veins, wear support hose. Elevate your feet for 15 minutes, 3-4 times a day. Limit salt in your diet. Avoid heavy lifting, wear low heal shoes, and practice good posture. Rest a lot with your legs elevated if you have leg cramps or low back pain. Visit your dentist if you have not gone during your pregnancy. Use a soft toothbrush to brush your teeth and be gentle when you floss. A sexual relationship may be continued unless your caregiver directs you otherwise. Do not travel far distances unless it is absolutely necessary and only with the approval of your caregiver. Take prenatal classes to understand, practice, and ask questions about the labor and delivery. Make a trial run to the hospital. Pack your hospital bag. Prepare the baby's nursery. Continue to go to all your prenatal visits as directed by your caregiver. SEEK MEDICAL CARE IF: You are unsure if you are in labor or if your water has broken. You have dizziness. You have mild pelvic cramps, pelvic pressure, or nagging pain in your abdominal area. You have persistent nausea, vomiting, or diarrhea. You have a bad smelling vaginal discharge. You have pain with urination. SEEK IMMEDIATE MEDICAL CARE IF:  You  have a fever. You are leaking fluid from your vagina. You have spotting or bleeding from your vagina. You have severe abdominal cramping or pain. You have rapid weight loss or gain. You have shortness of breath with chest pain. You notice sudden or extreme swelling of your face, hands, ankles, feet, or legs. You have not felt your baby move in over an hour. You have severe headaches that do not go away with medicine. You have vision changes. Document Released: 09/16/2001 Document Revised: 09/27/2013 Document Reviewed: 11/23/2012 ExitCare Patient Information 2015 [employer]. This information is not intended to replace advice given to you by your health care provider. Make sure you discuss any questions you have with your health care provider.       

## 2022-04-07 NOTE — Progress Notes (Signed)
HIGH-RISK PREGNANCY VISIT Patient name: Monica Cox MRN [id number]  Date of birth: Oct 21, 1990 Chief Complaint:   Routine Prenatal Visit  History of Present Illness:   Monica Cox is a 31 y.o. [id number] female at [redacted]w[redacted]d with an Estimated Date of Delivery: 06/22/22 being seen today for ongoing management of a high-risk pregnancy complicated by multiple gestation DCDA twins and vasa previa.    Today she reports no complaints. Contractions: Not present. Vag. Bleeding: None.  Movement: Present. denies leaking of fluid.      12/09/2021   11:33 AM 06/25/2017    3:34 PM  Depression screen PHQ 2/9  Decreased Interest 0 3  Down, Depressed, Hopeless 0 3  PHQ - 2 Score 0 6  Altered sleeping 1 3  Tired, decreased energy 0 3  Change in appetite 0 2  Feeling bad or failure about yourself  0 3  Trouble concentrating 0 2  Moving slowly or fidgety/restless 0 1  Suicidal thoughts 0 0  PHQ-9 Score 1 20  Difficult doing work/chores  Extremely dIfficult        12/09/2021   11:44 AM  GAD 7 : Generalized Anxiety Score  Nervous, Anxious, on Edge 1  Control/stop worrying 0  Worry too much - different things 0  Trouble relaxing 0  Restless 0  Easily annoyed or irritable 1  Afraid - awful might happen 0  Total GAD 7 Score 2     Review of Systems:   Pertinent items are noted in HPI Denies abnormal vaginal discharge w/ itching/odor/irritation, headaches, visual changes, shortness of breath, chest pain, abdominal pain, severe nausea/vomiting, or problems with urination or bowel movements unless otherwise stated above. Pertinent History Reviewed:  Reviewed past medical,surgical, social, obstetrical and family history.  Reviewed problem list, medications and allergies. Physical Assessment:   Vitals:   04/07/22 1610  BP: 121/80  Pulse: 93  Weight: 204 lb 12.8 oz (92.9 kg)  Body mass index is 36.28 kg/m.           Physical Examination:   General appearance: alert, well  appearing, and in no distress  Mental status: alert, oriented to person, place, and time  Skin: warm & dry   Extremities: Edema: Trace    Cardiovascular: normal heart rate noted  Respiratory: normal respiratory effort, no distress  Abdomen: gravid, soft, non-tender  Pelvic: Cervical exam deferred         Fetal Status: Fetal Heart Rate (bpm): 140/140   Movement: Present    Fetal Surveillance Testing today: [place] 29+1 wks,DC/DA twins,cx 3.1 cm,normal ovaries BABY A:boy,cephalic,anterior placenta gr 3,marginal cord insertion,vasa previa,SVP of fluid 5.7 cm,FHR 140 bpm,EFW 1379 g 45% BABY B:girl,transverse head right,anterior fundal placenta gr 3,posterior accessory lobe,SVP of fluid 5 cm,FHR 140 bpm,EFW 1413 g 52%,discordance 2.4%  Chaperone: N/A    No results found for this or any previous visit (from the past 24 hour(s)).  Assessment & Plan:  High-risk pregnancy: [id number] at [redacted]w[redacted]d with an Estimated Date of Delivery: 06/22/22   1) DCDA twins, stable, 2.4% discordance today  2) Vasa previa, plan inpt @ 32wks, RCS @ 34wks  3) H/O pre-e & pp pre-e> ASA, reviewed s/s, reasons to seek care  Meds:  Meds ordered this encounter  Medications   pantoprazole (PROTONIX) 20 MG tablet    Sig: Take 1 tablet (20 mg total) by mouth daily.    Dispense:  30 tablet    Refill:  2    Order Specific Question:   Supervising  Provider    Answer:   Lazaro Arms [[id number]]    Labs/procedures today: tdap, Rhogam, and U/S  Treatment Plan:  f/u 7/17 as scheduled, inpt @ 32wks, RCS @ 34wks  Reviewed: Preterm labor symptoms and general obstetric precautions including but not limited to vaginal bleeding, contractions, leaking of fluid and fetal movement were reviewed in detail with the patient.  All questions were answered. Does have home bp cuff. Office bp cuff given: not applicable. Check bp weekly, let us know if consistently >140 and/or >90.  Follow-up: Return for As scheduled.   Future Appointments  Date  Time Provider Department Center  04/21/2022  4:10 PM Myna Hidalgo, DO [facility]    Orders Placed This Encounter  Procedures   RHO (D) Immune Globulin   Tdap vaccine greater than or equal to 7yo IM   POC Urinalysis Dipstick OB   Cheral Marker [place], [facility] 04/07/2022 4:39 PM

## 2022-04-21 ENCOUNTER — Encounter: Payer: Self-pay | Admitting: Obstetrics & Gynecology

## 2022-04-21 ENCOUNTER — Ambulatory Visit (INDEPENDENT_AMBULATORY_CARE_PROVIDER_SITE_OTHER): Payer: Medicaid Other | Admitting: Obstetrics & Gynecology

## 2022-04-21 VITALS — BP 136/71 | HR 103 | Wt 206.0 lb

## 2022-04-21 DIAGNOSIS — O30042 Twin pregnancy, dichorionic/diamniotic, second trimester: Secondary | ICD-10-CM

## 2022-04-21 DIAGNOSIS — O0993 Supervision of high risk pregnancy, unspecified, third trimester: Secondary | ICD-10-CM

## 2022-04-21 NOTE — Progress Notes (Signed)
HIGH-RISK PREGNANCY VISIT Patient name: Monica Cox MRN [id number]  Date of birth: 08/14/1991 Chief Complaint:   Routine Prenatal Visit  History of Present Illness:   Monica Cox is a 31 y.o. [id number] female at [redacted]w[redacted]d with an Estimated Date of Delivery: 06/22/22 being seen today for ongoing management of a high-risk pregnancy complicated by:  -DC/DA twins with vasa previa  Today she reports  vaginal pressure .   Contractions: Not present. Vag. Bleeding: None.  Movement: Present. denies leaking of fluid.      12/09/2021   11:33 AM 06/25/2017    3:34 PM  Depression screen PHQ 2/9  Decreased Interest 0 3  Down, Depressed, Hopeless 0 3  PHQ - 2 Score 0 6  Altered sleeping 1 3  Tired, decreased energy 0 3  Change in appetite 0 2  Feeling bad or failure about yourself  0 3  Trouble concentrating 0 2  Moving slowly or fidgety/restless 0 1  Suicidal thoughts 0 0  PHQ-9 Score 1 20  Difficult doing work/chores  Extremely dIfficult     Current Outpatient Medications  Medication Instructions   aspirin EC 162 mg, Oral, Daily, Swallow whole.   Blood Pressure Monitor MISC For regular home bp monitoring during pregnancy   escitalopram (LEXAPRO) 10 MG tablet TAKE 1 TABLET BY MOUTH EVERY DAY   ferrous gluconate (FERGON) 324 mg, Oral, Every other day   pantoprazole (PROTONIX) 20 mg, Oral, Daily   Prenatal MV & Min w/FA-DHA (PRENATAL GUMMIES PO) Oral   promethazine (PHENERGAN) 25 MG tablet TAKE 1 TABLET BY MOUTH EVERY 6 HOURS AS NEEDED FOR NAUSEA OR VOMITING.     Review of Systems:   Pertinent items are noted in HPI Denies abnormal vaginal discharge w/ itching/odor/irritation, headaches, visual changes, shortness of breath, chest pain, abdominal pain, severe nausea/vomiting, or problems with urination or bowel movements unless otherwise stated above. Pertinent History Reviewed:  Reviewed past medical,surgical, social, obstetrical and family history.  Reviewed problem  list, medications and allergies. Physical Assessment:   Vitals:   04/21/22 1619  BP: 136/71  Pulse: (!) 103  Weight: 206 lb (93.4 kg)  Body mass index is 36.49 kg/m.           Physical Examination:   General appearance: alert, well appearing, and in no distress  Mental status: normal mood, behavior, speech, dress, motor activity, and thought processes  Skin: warm & dry   Extremities: Edema: Trace    Cardiovascular: normal heart rate noted  Respiratory: normal respiratory effort, no distress  Abdomen: gravid, soft, non-tender  Pelvic: Cervical exam deferred         Fetal Status:     Movement: Present   Twin A- 145bpm Twin B: 150bpm  Fetal Surveillance Testing today: bedside US to confirm FHT of both twins   Chaperone: N/A    No results found for this or any previous visit (from the past 24 hour(s)).   Assessment & Plan:  High-risk pregnancy: [id number] at [redacted]w[redacted]d with an Estimated Date of Delivery: 06/22/22   1) DC/DA twins with vasa previa -admit this Sunday -reviewed precautions -plan for Repeat C-section @ 34wk  2) Contraception -desires BTL  Meds: No orders of the defined types were placed in this encounter.   Labs/procedures today: doppler  Treatment Plan:  as outlined above  Reviewed: Preterm labor symptoms and general obstetric precautions including but not limited to vaginal bleeding, contractions, leaking of fluid and fetal movement were reviewed in detail with the  patient.  All questions were answered. Pt has home bp cuff. Check bp weekly, let us know if >140/90.   Follow-up: Return for admit next week in hospital- follow up TBD.   No future appointments.  No orders of the defined types were placed in this encounter.   Myna Hidalgo, DO Attending Obstetrician & Gynecologist, [facility] for [employer], [facility]

## 2022-04-22 NOTE — Addendum Note (Signed)
Addended by: Sharon Seller on: 04/22/2022 09:02 AM   Modules accepted: Orders

## 2022-04-25 NOTE — Patient Instructions (Addendum)
05/13/2022 Monica Cox  04/25/2022   Your procedure is scheduled on:  05/11/2022  Arrive at 0730 at Entrance C on [address] at [facility]  and [address]. You are invited to use the FREE valet parking or use the Visitor's parking deck.  Pick up the phone at the desk and dial [phone].  Call this number if you have problems the morning of surgery: [phone]  Remember:   Do not eat food:(After Midnight) Desps de medianoche.  Do not drink clear liquids: (After Midnight) Desps de medianoche.  Take these medicines the morning of surgery with A SIP OF WATER:  none   Do not wear jewelry, make-up or nail polish.  Do not wear lotions, powders, or perfumes. Do not wear deodorant.  Do not shave 48 hours prior to surgery.  Do not bring valuables to the hospital.  [facility] is not   responsible for any belongings or valuables brought to the hospital.  Contacts, dentures or bridgework may not be worn into surgery.  Leave suitcase in the car. After surgery it may be brought to your room.  For patients admitted to the hospital, checkout time is 11:00 AM the day of              discharge.      Please read over the following fact sheets that you were given:     Preparing for Surgery

## 2022-04-27 ENCOUNTER — Telehealth: Payer: Self-pay | Admitting: Obstetrics and Gynecology

## 2022-04-27 NOTE — Telephone Encounter (Signed)
Pt was to be admitted today for management of Di/Di twins, in setting of IUP 32 weeks and Tw A with vasa previa. NICU is full. Contacted [facility] about transfer for 5-7 days until NICU bed available. Refused due to nursing staff availability  As pt is and has been stable through out pregnancy, ie no vaginal bleeding.  Will hold admission for today and discuss with  team tomorrow.   Discussed situation with Ms Pickard-Lee by phone. Informed pt that she would be contacted tomorrow. If unable to keep pt in [facility], pt requests admission to [facility].

## 2022-04-28 ENCOUNTER — Encounter: Payer: Self-pay | Admitting: Obstetrics and Gynecology

## 2022-04-28 ENCOUNTER — Ambulatory Visit: Payer: Medicaid Other | Admitting: *Deleted

## 2022-04-28 ENCOUNTER — Encounter (HOSPITAL_COMMUNITY): Payer: Self-pay

## 2022-04-28 ENCOUNTER — Other Ambulatory Visit: Payer: Self-pay | Admitting: Obstetrics and Gynecology

## 2022-04-28 ENCOUNTER — Telehealth: Payer: Self-pay | Admitting: Obstetrics and Gynecology

## 2022-04-28 ENCOUNTER — Encounter: Payer: Self-pay | Admitting: Women's Health

## 2022-04-28 ENCOUNTER — Ambulatory Visit: Payer: Medicaid Other | Attending: Obstetrics and Gynecology

## 2022-04-28 VITALS — BP 127/62 | HR 108

## 2022-04-28 DIAGNOSIS — Z3A32 32 weeks gestation of pregnancy: Secondary | ICD-10-CM

## 2022-04-28 DIAGNOSIS — O30042 Twin pregnancy, dichorionic/diamniotic, second trimester: Secondary | ICD-10-CM

## 2022-04-28 DIAGNOSIS — O30043 Twin pregnancy, dichorionic/diamniotic, third trimester: Secondary | ICD-10-CM

## 2022-04-28 DIAGNOSIS — Z6791 Unspecified blood type, Rh negative: Secondary | ICD-10-CM | POA: Insufficient documentation

## 2022-04-28 DIAGNOSIS — O26899 Other specified pregnancy related conditions, unspecified trimester: Secondary | ICD-10-CM | POA: Insufficient documentation

## 2022-04-28 DIAGNOSIS — O165 Unspecified maternal hypertension, complicating the puerperium: Secondary | ICD-10-CM | POA: Insufficient documentation

## 2022-04-28 NOTE — Telephone Encounter (Signed)
OB Telephone Note NICU still very high census with more early third trimester admits this morning via MAU. NICU confirms and recommend scheduled admit at another facility if at all possible. I called the patient and she was amenable to this and prefers [facility].  I called [facility] and spoke to Dr. Council Mechanic and they were amenable to admission. He will coordinate to try and get her an updated u/s in their office prior to scheduled admission and will call her with further details  I called Ms. Pickard-Lee back and told her to anticipate a call from [facility]. She asked if there was a possibility to be transferred back to [facility] but I told her to anticipate staying at [facility] for the duration and delivering there. Patient amenable to plan  Cornelia Copa MD Attending Center for [facility] (Faculty Practice) 04/28/2022 Time: 1045am

## 2022-04-28 NOTE — Telephone Encounter (Signed)
OB Telephone Note Patient called me stating that the earliest she could be seen at the [facility] outpatient clinic would be Friday and she was uncomfortable with that. I told her that didn't sound right as [facility] just wants an MFM u/s and an updated one to see what's going on, but their hospital sonographer is not there today.  I called Dr. Council Mechanic and he said that the earliest outpatient appt that worked for the patient was on Friday but that she was able to come to [facility] birth center anytime tomorrow 8-5 for an u/s with them.   I called the patient back and she said that if she could go to another hospital like [facility] b/c it's only 15 more minutes further than [facility] to see about admitting tonight, as she doesn't want to wait until tomorrow b/c she has been told how acute everything is. I told her I agree with [facility] wanting their own MFMs to scan her first especially since it's been three weeks since her last scan and she's never been seen by an MFM.  She also said that what if [facility] scans her and then sends her home. I told her then that would most likely be a good thing b/c then she doesn't have anything that warrants inpatient admission  I said to let me see if our MFMs could scan her today and if the previa is confirmed with ours if [facility] would be okay with that and take her tonight and then they can do their own scan tomorrow. She was okay with this  Cornelia Copa MD Attending Center for [employer] (Faculty Practice) 04/28/2022 Time: 1145am   I called Dr. Judeth Cornfield and he was amenable to this and will be able to fit her in at 1430.  I called the patient back and she was amenable to this plan. I told her that someone from the [address] office will reach out to her to finalize this appt.   She continues to deny any VB, spotting or OB s/s.   Cornelia Copa MD Attending Center for [employer] (Faculty Practice) 04/28/2022 Time: 1230pm

## 2022-04-29 ENCOUNTER — Ambulatory Visit (INDEPENDENT_AMBULATORY_CARE_PROVIDER_SITE_OTHER): Payer: Medicaid Other | Admitting: Obstetrics & Gynecology

## 2022-04-29 ENCOUNTER — Encounter: Payer: Self-pay | Admitting: Obstetrics & Gynecology

## 2022-04-29 ENCOUNTER — Ambulatory Visit (INDEPENDENT_AMBULATORY_CARE_PROVIDER_SITE_OTHER): Payer: Medicaid Other

## 2022-04-29 ENCOUNTER — Other Ambulatory Visit: Payer: Self-pay | Admitting: Obstetrics & Gynecology

## 2022-04-29 VITALS — BP 130/73 | HR 105 | Wt 211.0 lb

## 2022-04-29 DIAGNOSIS — O30042 Twin pregnancy, dichorionic/diamniotic, second trimester: Secondary | ICD-10-CM

## 2022-04-29 DIAGNOSIS — O30043 Twin pregnancy, dichorionic/diamniotic, third trimester: Secondary | ICD-10-CM

## 2022-04-29 DIAGNOSIS — O0993 Supervision of high risk pregnancy, unspecified, third trimester: Secondary | ICD-10-CM

## 2022-04-29 NOTE — Progress Notes (Signed)
HIGH-RISK PREGNANCY VISIT Patient name: Monica Cox MRN [id number]  Date of birth: 12-Dec-1990 Chief Complaint:   Routine Prenatal Visit  History of Present Illness:   Monica Cox is a 31 y.o. [id number] female at 110w2d with an Estimated Date of Delivery: 06/22/22 being seen today for ongoing management of a high-risk pregnancy complicated by:  DC/DA twins Vasa previa- MFM [place] completed yesterday- did not show signs of vasa previa.  Reviewed findings with patient, questions/concerns were addressed. Hospital admission not indicated at this time. Marginal cord insertion Twin B    Today she reports no complaints.  Contractions: Not present. Vag. Bleeding: None.  Movement: Present. denies leaking of fluid.      12/09/2021   11:33 AM 06/25/2017    3:34 PM  Depression screen PHQ 2/9  Decreased Interest 0 3  Down, Depressed, Hopeless 0 3  PHQ - 2 Score 0 6  Altered sleeping 1 3  Tired, decreased energy 0 3  Change in appetite 0 2  Feeling bad or failure about yourself  0 3  Trouble concentrating 0 2  Moving slowly or fidgety/restless 0 1  Suicidal thoughts 0 0  PHQ-9 Score 1 20  Difficult doing work/chores  Extremely dIfficult     Current Outpatient Medications  Medication Instructions   aspirin EC 162 mg, Oral, Daily, Swallow whole.   Blood Pressure Monitor MISC For regular home bp monitoring during pregnancy   escitalopram (LEXAPRO) 10 MG tablet TAKE 1 TABLET BY MOUTH EVERY DAY   ferrous gluconate (FERGON) 324 mg, Oral, Every other day   pantoprazole (PROTONIX) 20 mg, Oral, Daily   Prenatal MV & Min w/FA-DHA (PRENATAL GUMMIES PO) Oral   promethazine (PHENERGAN) 25 MG tablet TAKE 1 TABLET BY MOUTH EVERY 6 HOURS AS NEEDED FOR NAUSEA OR VOMITING.     Review of Systems:   Pertinent items are noted in HPI Denies abnormal vaginal discharge w/ itching/odor/irritation, headaches, visual changes, shortness of breath, chest pain, abdominal pain, severe nausea/vomiting,  or problems with urination or bowel movements unless otherwise stated above. Pertinent History Reviewed:  Reviewed past medical,surgical, social, obstetrical and family history.  Reviewed problem list, medications and allergies. Physical Assessment:   Vitals:   04/29/22 1525 04/29/22 1528  BP: 136/75 130/73  Pulse: (!) 105   Weight: 211 lb (95.7 kg)   Body mass index is 37.38 kg/m.           Physical Examination:   General appearance: alert, well appearing, and in no distress  Mental status: normal mood, behavior, speech, dress, motor activity, and thought processes  Skin: warm & dry   Extremities: Edema: Trace    Cardiovascular: normal heart rate noted  Respiratory: normal respiratory effort, no distress  Abdomen: gravid, soft, non-tender  Pelvic: Cervical exam deferred         Fetal Status:     Movement: Present    Fetal Surveillance Testing today: DI/DA TWINS,cord 3.6 cm from cx (image 26) BABY A: female,anterior placenta gr 3,cephalic left,SVP of fluid 4.4 cm,FHR 138 bpm,EFW 1971 g 44%,(limited head measurement) BABY B: female,anterior placenta with posterior accessory lobe gr 3,breech right,SVP of fluid 4.9 cm,FHR 166 bpm,EFW 2085 g 60%,discordance 5.5%   Chaperone: N/A    No results found for this or any previous visit (from the past 24 hour(s)).   Assessment & Plan:  High-risk pregnancy: [id number] at [redacted]w[redacted]d with an Estimated Date of Delivery: 06/22/22   1) DC/DA twins -with resolution of vasa previa- hospital  admission no longer indicated -also if desired, pt could consider vaginal delivery.  Reviewed risk/benefit of each option.  After discussion, pt desires to proceed with primary C_section Will schedule for 38wks (or sooner if clinically indicated) -continue growth q 3-4wks -BPP starting @ 36wk  2) O neg S/p RhoGAM  Meds: No orders of the defined types were placed in this encounter.   Labs/procedures today: growth scan  Treatment Plan:  as outlined  above  Reviewed: Preterm labor symptoms and general obstetric precautions including but not limited to vaginal bleeding, contractions, leaking of fluid and fetal movement were reviewed in detail with the patient.  All questions were answered. Pt has home bp cuff. Check bp weekly, let us know if >140/90.   Follow-up: Return in about 2 weeks (around 05/13/2022) for HROB visit.   Future Appointments  Date Time Provider Department Center  05/09/2022 10:30 AM [facility] PAT 1 [facility]IN[facility] None  05/13/2022  1:30 PM Myna Hidalgo, DO [facility]  05/28/2022 12:10 AM Myna Hidalgo, DO [facility]  05/28/2022 10:45 AM [facility] - [facility] [place] [facility] None  06/02/2022  9:15 AM [facility] - [facility] [place] [facility] None  06/02/2022 10:50 AM Myna Hidalgo, DO [facility]    No orders of the defined types were placed in this encounter.   Myna Hidalgo, DO Attending Obstetrician & Gynecologist, [facility] for [employer], [facility]

## 2022-04-29 NOTE — Progress Notes (Addendum)
[place] 32+2 wks,TV/TA: DI/DA TWINS,cord 3.6 cm from cx (image 26) BABY A: female,anterior placenta gr 3,cephalic left,SVP of fluid 4.4 cm,FHR 138 bpm,EFW 1971 g 44%,(limited head measurement) BABY B: female,anterior placenta with posterior accessory lobe gr 3,breech right,SVP of fluid 4.9 cm,FHR 166 bpm,EFW 2085 g 60%,discordance 5.5%

## 2022-05-06 DIAGNOSIS — Z419 Encounter for procedure for purposes other than remedying health state, unspecified: Secondary | ICD-10-CM | POA: Diagnosis not present

## 2022-05-09 ENCOUNTER — Encounter (HOSPITAL_COMMUNITY)
Admission: RE | Admit: 2022-05-09 | Discharge: 2022-05-09 | Disposition: A | Discharge status: Home / Self Care | Payer: Medicaid Other | Source: Ambulatory Visit | Attending: Nurse Practitioner | Admitting: Nurse Practitioner

## 2022-05-09 HISTORY — DX: Mild to moderate pre-eclampsia, complicating the puerperium: O14.05

## 2022-05-11 ENCOUNTER — Encounter (HOSPITAL_COMMUNITY): Payer: Self-pay

## 2022-05-11 ENCOUNTER — Inpatient Hospital Stay (HOSPITAL_COMMUNITY): Admit: 2022-05-11 | Payer: Medicaid Other | Admitting: Obstetrics & Gynecology

## 2022-05-11 SURGERY — Surgical Case
Anesthesia: Regional | Laterality: Bilateral

## 2022-05-13 ENCOUNTER — Ambulatory Visit (INDEPENDENT_AMBULATORY_CARE_PROVIDER_SITE_OTHER): Payer: Medicaid Other | Admitting: Obstetrics & Gynecology

## 2022-05-13 ENCOUNTER — Encounter: Payer: Self-pay | Admitting: Obstetrics & Gynecology

## 2022-05-13 ENCOUNTER — Other Ambulatory Visit: Payer: Medicaid Other

## 2022-05-13 VITALS — BP 117/77 | HR 89 | Wt 214.2 lb

## 2022-05-13 DIAGNOSIS — O30043 Twin pregnancy, dichorionic/diamniotic, third trimester: Secondary | ICD-10-CM

## 2022-05-13 DIAGNOSIS — O0993 Supervision of high risk pregnancy, unspecified, third trimester: Secondary | ICD-10-CM

## 2022-05-13 DIAGNOSIS — Z3A34 34 weeks gestation of pregnancy: Secondary | ICD-10-CM

## 2022-05-13 DIAGNOSIS — O30042 Twin pregnancy, dichorionic/diamniotic, second trimester: Secondary | ICD-10-CM

## 2022-05-13 NOTE — Progress Notes (Signed)
HIGH-RISK PREGNANCY VISIT Patient name: Monica Cox MRN [id number]  Date of birth: 1991/08/08 Chief Complaint:   High Risk Gestation (Pelvic pressure/pain; needs refill on nausea med)  History of Present Illness:   Monica Cox is a 31 y.o. [id number] female at [redacted]w[redacted]d with an Estimated Date of Delivery: 06/22/22 being seen today for ongoing management of a high-risk pregnancy complicated by  DC/DA twins Vasa previa- resolved Marginal cord insertion Twin B  .    Today she reports no complaints- irregular contractions only   Contractions: Irritability. Vag. Bleeding: None.  Movement: Present. denies leaking of fluid.      12/09/2021   11:33 AM 06/25/2017    3:34 PM  Depression screen PHQ 2/9  Decreased Interest 0 3  Down, Depressed, Hopeless 0 3  PHQ - 2 Score 0 6  Altered sleeping 1 3  Tired, decreased energy 0 3  Change in appetite 0 2  Feeling bad or failure about yourself  0 3  Trouble concentrating 0 2  Moving slowly or fidgety/restless 0 1  Suicidal thoughts 0 0  PHQ-9 Score 1 20  Difficult doing work/chores  Extremely dIfficult     Current Outpatient Medications  Medication Instructions   aspirin EC 162 mg, Oral, Daily, Swallow whole.   Blood Pressure Monitor MISC For regular home bp monitoring during pregnancy   escitalopram (LEXAPRO) 10 MG tablet TAKE 1 TABLET BY MOUTH EVERY DAY   ferrous gluconate (FERGON) 324 mg, Oral, Every other day   pantoprazole (PROTONIX) 20 mg, Oral, Daily   Prenatal MV & Min w/FA-DHA (PRENATAL GUMMIES PO) Oral   promethazine (PHENERGAN) 25 MG tablet TAKE 1 TABLET BY MOUTH EVERY 6 HOURS AS NEEDED FOR NAUSEA OR VOMITING.     Review of Systems:   Pertinent items are noted in HPI Denies abnormal vaginal discharge w/ itching/odor/irritation, headaches, visual changes, shortness of breath, chest pain, abdominal pain, severe nausea/vomiting, or problems with urination or bowel movements unless otherwise stated above. Pertinent  History Reviewed:  Reviewed past medical,surgical, social, obstetrical and family history.  Reviewed problem list, medications and allergies. Physical Assessment:   Vitals:   05/13/22 1337  BP: 117/77  Pulse: 89  Weight: 214 lb 3.2 oz (97.2 kg)  Body mass index is 37.94 kg/m.           Physical Examination:   General appearance: alert, well appearing, and in no distress  Mental status: normal mood, behavior, speech, dress, motor activity, and thought processes  Skin: warm & dry   Extremities: Edema: Trace    Cardiovascular: normal heart rate noted  Respiratory: normal respiratory effort, no distress  Abdomen: gravid, soft, non-tender  Pelvic: Cervical exam deferred         Fetal Status: Fetal Heart Rate (bpm): 155/160 Fundal Height: 41 cm Movement: Present   Bedside US; vertex/breech- confirmed FHT of both twins  Fetal Surveillance Testing today: doppler   Chaperone: N/A    No results found for this or any previous visit (from the past 24 hour(s)).   Assessment & Plan:  High-risk pregnancy: [id number] at [redacted]w[redacted]d with an Estimated Date of Delivery: 06/22/22   1) DC/DA twins -start BPP/NST weekly @ 36wks -continue growth q 4wks -plan for primary C-section and BTL @ 38wks []  GBS next visit  Meds: No orders of the defined types were placed in this encounter.   Labs/procedures today: doppler  Treatment Plan:  as outlined above  Reviewed: Preterm labor symptoms and general obstetric precautions including  but not limited to vaginal bleeding, contractions, leaking of fluid and fetal movement were reviewed in detail with the patient.  All questions were answered. Pt has home bp cuff. Check bp weekly, let us know if >140/90.   Follow-up: Return for 2 weeks with BPP/NST weekly and HROB.   Future Appointments  Date Time Provider Department Center  05/26/2022  2:50 PM [facility] NURSE [facility]  05/28/2022 10:45 AM [facility] - [facility] [place] [facility] None  05/28/2022 12:15 PM Myna Hidalgo, DO [facility]  06/02/2022 10:00 AM [facility] - [facility] [place] [facility] None  06/02/2022 10:50 AM Myna Hidalgo, DO [facility]  06/05/2022  2:30 PM [facility] NURSE [facility]    No orders of the defined types were placed in this encounter.   Myna Hidalgo, DO Attending Obstetrician & Gynecologist, [facility] for [employer], [facility]

## 2022-05-21 ENCOUNTER — Inpatient Hospital Stay (HOSPITAL_COMMUNITY)
Admission: AD | Admit: 2022-05-21 | Discharge: 2022-05-22 | Disposition: A | Discharge status: Home / Self Care | Payer: Medicaid Other | Attending: Obstetrics & Gynecology | Admitting: Obstetrics & Gynecology

## 2022-05-21 DIAGNOSIS — O321XX0 Maternal care for breech presentation, not applicable or unspecified: Secondary | ICD-10-CM | POA: Insufficient documentation

## 2022-05-21 DIAGNOSIS — Z3A35 35 weeks gestation of pregnancy: Secondary | ICD-10-CM | POA: Insufficient documentation

## 2022-05-21 DIAGNOSIS — O99333 Smoking (tobacco) complicating pregnancy, third trimester: Secondary | ICD-10-CM | POA: Insufficient documentation

## 2022-05-21 DIAGNOSIS — O113 Pre-existing hypertension with pre-eclampsia, third trimester: Secondary | ICD-10-CM | POA: Insufficient documentation

## 2022-05-21 DIAGNOSIS — O10913 Unspecified pre-existing hypertension complicating pregnancy, third trimester: Secondary | ICD-10-CM | POA: Insufficient documentation

## 2022-05-21 DIAGNOSIS — O26893 Other specified pregnancy related conditions, third trimester: Secondary | ICD-10-CM | POA: Insufficient documentation

## 2022-05-21 DIAGNOSIS — O99343 Other mental disorders complicating pregnancy, third trimester: Secondary | ICD-10-CM | POA: Insufficient documentation

## 2022-05-21 DIAGNOSIS — O99513 Diseases of the respiratory system complicating pregnancy, third trimester: Secondary | ICD-10-CM | POA: Insufficient documentation

## 2022-05-21 DIAGNOSIS — O471 False labor at or after 37 completed weeks of gestation: Secondary | ICD-10-CM | POA: Insufficient documentation

## 2022-05-21 DIAGNOSIS — O09293 Supervision of pregnancy with other poor reproductive or obstetric history, third trimester: Secondary | ICD-10-CM | POA: Insufficient documentation

## 2022-05-21 DIAGNOSIS — O479 False labor, unspecified: Secondary | ICD-10-CM

## 2022-05-21 DIAGNOSIS — R109 Unspecified abdominal pain: Secondary | ICD-10-CM | POA: Insufficient documentation

## 2022-05-21 DIAGNOSIS — O30043 Twin pregnancy, dichorionic/diamniotic, third trimester: Secondary | ICD-10-CM | POA: Insufficient documentation

## 2022-05-21 DIAGNOSIS — O99613 Diseases of the digestive system complicating pregnancy, third trimester: Secondary | ICD-10-CM | POA: Insufficient documentation

## 2022-05-21 NOTE — MAU Note (Signed)
.  Monica Cox is a 31 y.o. at [redacted]w[redacted]d here in MAU reporting: CTX since 2012 tonight, reports lost mucus plug last week. Pt denies VB, LOF, DFM, PIH s/s. Last intercourse 2 nights ago. Sve Ukn  GBS ukn Didi Twins, A-Vertex, B-Breech Placenta Previa resolved  Onset of complaint: 2012 Pain score: 8/10 Vitals:   05/21/22 2339  BP: 123/75  Pulse: (!) 102  Resp: 18  Temp: 98 F (36.7 C)  SpO2: 100%     FHT:A-145, B-150 Lab orders placed from triage: UA

## 2022-05-22 ENCOUNTER — Encounter: Payer: Self-pay | Admitting: Obstetrics & Gynecology

## 2022-05-22 ENCOUNTER — Encounter (HOSPITAL_COMMUNITY): Payer: Self-pay | Admitting: Obstetrics & Gynecology

## 2022-05-22 DIAGNOSIS — O321XX0 Maternal care for breech presentation, not applicable or unspecified: Secondary | ICD-10-CM | POA: Diagnosis not present

## 2022-05-22 DIAGNOSIS — O99343 Other mental disorders complicating pregnancy, third trimester: Secondary | ICD-10-CM | POA: Diagnosis not present

## 2022-05-22 DIAGNOSIS — O479 False labor, unspecified: Secondary | ICD-10-CM | POA: Diagnosis not present

## 2022-05-22 DIAGNOSIS — O471 False labor at or after 37 completed weeks of gestation: Secondary | ICD-10-CM | POA: Diagnosis not present

## 2022-05-22 DIAGNOSIS — O09293 Supervision of pregnancy with other poor reproductive or obstetric history, third trimester: Secondary | ICD-10-CM | POA: Diagnosis not present

## 2022-05-22 DIAGNOSIS — Z3A35 35 weeks gestation of pregnancy: Secondary | ICD-10-CM

## 2022-05-22 DIAGNOSIS — R109 Unspecified abdominal pain: Secondary | ICD-10-CM | POA: Diagnosis not present

## 2022-05-22 DIAGNOSIS — O99333 Smoking (tobacco) complicating pregnancy, third trimester: Secondary | ICD-10-CM | POA: Diagnosis not present

## 2022-05-22 DIAGNOSIS — O99513 Diseases of the respiratory system complicating pregnancy, third trimester: Secondary | ICD-10-CM | POA: Diagnosis not present

## 2022-05-22 DIAGNOSIS — O99613 Diseases of the digestive system complicating pregnancy, third trimester: Secondary | ICD-10-CM | POA: Diagnosis not present

## 2022-05-22 DIAGNOSIS — O26893 Other specified pregnancy related conditions, third trimester: Secondary | ICD-10-CM | POA: Diagnosis not present

## 2022-05-22 DIAGNOSIS — O10913 Unspecified pre-existing hypertension complicating pregnancy, third trimester: Secondary | ICD-10-CM | POA: Diagnosis not present

## 2022-05-22 DIAGNOSIS — O30043 Twin pregnancy, dichorionic/diamniotic, third trimester: Secondary | ICD-10-CM | POA: Diagnosis not present

## 2022-05-22 DIAGNOSIS — O113 Pre-existing hypertension with pre-eclampsia, third trimester: Secondary | ICD-10-CM | POA: Diagnosis not present

## 2022-05-22 LAB — URINALYSIS, ROUTINE W REFLEX MICROSCOPIC
Bilirubin Urine: NEGATIVE
Glucose, UA: NEGATIVE mg/dL
Hgb urine dipstick: NEGATIVE
Ketones, ur: 20 mg/dL — AB
Nitrite: NEGATIVE
Protein, ur: 30 mg/dL — AB
Specific Gravity, Urine: 1.024 (ref 1.005–1.030)
pH: 5 (ref 5.0–8.0)

## 2022-05-22 NOTE — MAU Provider Note (Signed)
Chief Complaint:  Contractions   Event Date/Time   First Provider Initiated Contact with Patient 05/22/22 0122     HPI: Monica Cox is a 31 y.o. [id number] at 50w4dwho presents to maternity admissions reporting contractions for several hours.  States lost mucous plug last week.  . She reports good fetal movement, denies LOF, vaginal bleeding, vaginal itching/burning, urinary symptoms, h/a, dizziness, n/v, diarrhea, constipation or fever/chills.    Abdominal Pain This is a recurrent problem. The current episode started today. The onset quality is gradual. The problem occurs intermittently. The quality of the pain is cramping. The abdominal pain does not radiate. Pertinent negatives include no fever, frequency, myalgias or nausea. Nothing aggravates the pain. The pain is relieved by Nothing. She has tried nothing for the symptoms.   RN Note: [name] is a 31 y.o. at [redacted]w[redacted]d here in MAU reporting: CTX since 2012 tonight, reports lost mucus plug last week. Pt denies VB, LOF, DFM, PIH s/s. Last intercourse 2 nights ago. Sve Ukn GBS ukn Didi Twins, A-Vertex, B-Breech Placenta Previa resolved      Onset of complaint: 2012 Pain score: 8/10  Past Medical History: Past Medical History:  Diagnosis Date   Anemia    Asthma    inhaler as needed   Depression    GERD (gastroesophageal reflux disease)    H/O trichomonas 08/08/2014   2015 pregnancy    History of chlamydia 03/19/2016   2015 pregnancy    History of kidney stones    Mild to moderate pre-eclampsia, postpartum    Nausea 02/06/2016   Postpartum hypertension    pp htn   Reflux    Smoker 02/06/2016   Trichomonas infection 08/08/2014   Vaginal Pap smear, abnormal    Vertigo 09/21/2014   antivert rx 09/21/2014      Past obstetric history: OB History  Gravida Para Term Preterm AB Living  6 4 4  0 1 4  SAB IAB Ectopic Multiple Live Births  1 0 0   4    # Outcome Date GA Lbr Len/2nd Weight Sex Delivery Anes PTL Lv   6 Current           5 Term 01/11/18 [redacted]w[redacted]d 10:19 / 00:02 3147 g F Vag-Spont Local, EPI  LIV  4 Term 10/05/16 [redacted]w[redacted]d / 00:20 3864 g F Vag-Spont EPI  LIV  3 Term 11/19/14 [redacted]w[redacted]d 13:18 / 00:10 3895 g F Vag-Spont EPI N LIV     Complications: Pre-eclampsia  2 Term 04/24/09 [redacted]w[redacted]d  3430 g M Vag-Spont EPI N LIV     Birth Comments: immediate pp HTN  1 SAB             Past Surgical History: Past Surgical History:  Procedure Laterality Date   DILATION AND CURETTAGE OF UTERUS     MAB Dr. [redacted]w[redacted]d performed   ESOPHAGOGASTRODUODENOSCOPY (EGD) WITH PROPOFOL N/A 09/23/2018   Procedure: ESOPHAGOGASTRODUODENOSCOPY (EGD) WITH PROPOFOL;  Surgeon: 09/25/2018, MD;  Location: [facility];  Service: Endoscopy;  Laterality: N/A;  8:45am   ESOPHAGOGASTRODUODENOSCOPY ENDOSCOPY      Family History: Family History  Problem Relation Age of Onset   Ulcers Father    Hypertension Father    Hypertension Mother    Ulcers Paternal Grandmother    Diabetes Paternal Grandmother    Ulcers Paternal Aunt    Liver disease Neg Hx    Colon cancer Neg Hx    Gastric cancer Neg Hx    Esophageal cancer Neg Hx  Social History: Social History   Tobacco Use   Smoking status: Former    Types: Cigars    Quit date: 09/20/2016    Years since quitting: 5.6   Smokeless tobacco: Never  Vaping Use   Vaping Use: Never used  Substance Use Topics   Alcohol use: Not Currently    Comment: occasionally   Drug use: No    Allergies:  Allergies  Allergen Reactions   Sulfa Antibiotics Nausea And Vomiting   Tape Other (See Comments)    Tears skin    Meds:  Medications Prior to Admission  Medication Sig Dispense Refill Last Dose   aspirin 81 MG EC tablet Take 2 tablets (162 mg total) by mouth daily. Swallow whole. 180 tablet 2 Past Week   Blood Pressure Monitor MISC For regular home bp monitoring during pregnancy 1 each 0 Past Week   Prenatal MV & Min w/FA-DHA (PRENATAL GUMMIES PO) Take by mouth.   Past Week    promethazine (PHENERGAN) 25 MG tablet TAKE 1 TABLET BY MOUTH EVERY 6 HOURS AS NEEDED FOR NAUSEA OR VOMITING. 30 tablet 0 05/21/2022 at 1400   escitalopram (LEXAPRO) 10 MG tablet TAKE 1 TABLET BY MOUTH EVERY DAY (Patient not taking: Reported on 04/28/2022) 90 tablet 3    ferrous gluconate (FERGON) 324 MG tablet Take 1 tablet (324 mg total) by mouth every other day. 15 tablet 6    pantoprazole (PROTONIX) 20 MG tablet Take 1 tablet (20 mg total) by mouth daily. 30 tablet 2     I have reviewed patient's Past Medical Hx, Surgical Hx, Family Hx, Social Hx, medications and allergies.   ROS:  Review of Systems  Constitutional:  Negative for fever.  Gastrointestinal:  Positive for abdominal pain. Negative for nausea.  Genitourinary:  Negative for frequency.  Musculoskeletal:  Negative for myalgias.   Other systems negative  Physical Exam  Patient Vitals for the past 24 hrs:  BP Temp Temp src Pulse Resp SpO2 Height Weight  05/22/22 0115 139/80 -- -- 93 -- 98 % -- --  05/22/22 0108 133/78 -- -- 93 -- -- -- --  05/22/22 0040 -- -- -- -- -- 98 % -- --  05/22/22 0038 130/70 -- -- 98 -- -- -- --  05/21/22 2339 123/75 98 F (36.7 C) Oral (!) 102 18 100 % 5\' 3"  (1.6 m) 98.3 kg   Constitutional: Well-developed, well-nourished female in no acute distress.  Cardiovascular: normal rate and rhythm Respiratory: normal effort, clear to auscultation bilaterally GI: Abd soft, non-tender, gravid appropriate for gestational age.   No rebound or guarding. MS: Extremities nontender, no edema, normal ROM Neurologic: Alert and oriented x 4.  GU: Neg CVAT.  PELVIC EXAM:  Dilation: 2 Effacement (%): 70 Cervical Position: Posterior Station: , -3 Presentation: Vertex Exam by:: [email or web address], CNM  FHT:  Baseline 130/140 , moderate variability, accelerations present, no decelerations  Both twins reactive Contractions: Intermittent irritability with only 2 contractions while on monitor.     Labs: Results for orders placed or performed during the hospital encounter of 05/21/22 (from the past 24 hour(s))  Urinalysis, Routine w reflex microscopic     Status: Abnormal   Collection Time: 05/21/22 11:48 PM  Result Value Ref Range   Color, Urine AMBER (A) YELLOW   APPearance HAZY (A) CLEAR   Specific Gravity, Urine 1.024 1.005 - 1.030   pH 5.0 5.0 - 8.0   Glucose, UA NEGATIVE NEGATIVE mg/dL   Hgb urine dipstick NEGATIVE NEGATIVE  Bilirubin Urine NEGATIVE NEGATIVE   Ketones, ur 20 (A) NEGATIVE mg/dL   Protein, ur 30 (A) NEGATIVE mg/dL   Nitrite NEGATIVE NEGATIVE   Leukocytes,Ua MODERATE (A) NEGATIVE   RBC / HPF 0-5 0 - 5 RBC/hpf   WBC, UA 0-5 0 - 5 WBC/hpf   Bacteria, UA RARE (A) NONE SEEN   Squamous Epithelial / LPF 11-20 0 - 5   Mucus PRESENT    O/Negative/-- (03/06 1344)  Imaging:   MAU Course/MDM: I have reviewed the triage vital signs and the nursing notes.   Pertinent labs & imaging results that were available during my care of the patient were reviewed by me and considered in my medical decision making (see chart for details).      I have reviewed her medical records including past results, notes and treatments.   I have ordered labs and reviewed results. UA WNL, too advanced to do fetal fibronectin NST reviewed, reactive both twins  Treatments in MAU included EFM.    Assessment: Di-Di Twin Gestation at [redacted]w[redacted]d Irregular contractions, not in labor  Plan: Discharge home Labor precautions and fetal kick counts Follow up in Office for prenatal visits and recheck Encouraged to return if she develops worsening of symptoms, increase in pain, fever, or other concerning symptoms.  Pt stable at time of discharge.  Wynelle Bourgeois CNM, MSN Certified Nurse-Midwife 05/22/2022 1:35 AM

## 2022-05-22 NOTE — MAU Note (Signed)
I have communicated with Wynelle Bourgeois CNM and reviewed vital signs:  Vitals:   05/22/22 0108 05/22/22 0115  BP: 133/78 139/80  Pulse: 93 93  Resp:    Temp:    SpO2:  98%    Vaginal exam:  Dilation: 2 Effacement (%): 70 Cervical Position: Posterior Station: [place], -3 Presentation: Vertex Exam by:: Wynelle Bourgeois, CNM,   Also reviewed contraction pattern and that non-stress test is reactive.  It has been documented that patient is currently having a rare contraction with UI.  Based on this report provider has given order for discharge.  A discharge order and diagnosis entered by a provider.   Labor discharge instructions reviewed with patient.     Pt left without printed discharge paperwork. In Stable condition. Ambulatory off unit with husband.

## 2022-05-23 ENCOUNTER — Other Ambulatory Visit: Payer: Self-pay | Admitting: Obstetrics & Gynecology

## 2022-05-23 MED ORDER — PROMETHAZINE HCL 25 MG PO TABS: 25.0000 mg | ORAL_TABLET | Freq: Four times a day (QID) | ORAL | 0 refills | Status: DC | PRN

## 2022-05-26 ENCOUNTER — Telehealth (HOSPITAL_COMMUNITY): Payer: Self-pay | Admitting: *Deleted

## 2022-05-26 ENCOUNTER — Other Ambulatory Visit: Payer: Medicaid Other

## 2022-05-26 ENCOUNTER — Encounter (HOSPITAL_COMMUNITY): Payer: Self-pay

## 2022-05-26 ENCOUNTER — Ambulatory Visit (INDEPENDENT_AMBULATORY_CARE_PROVIDER_SITE_OTHER): Payer: Medicaid Other | Admitting: *Deleted

## 2022-05-26 DIAGNOSIS — Z3A36 36 weeks gestation of pregnancy: Secondary | ICD-10-CM

## 2022-05-26 DIAGNOSIS — O0993 Supervision of high risk pregnancy, unspecified, third trimester: Secondary | ICD-10-CM

## 2022-05-26 DIAGNOSIS — O30043 Twin pregnancy, dichorionic/diamniotic, third trimester: Secondary | ICD-10-CM | POA: Diagnosis not present

## 2022-05-26 NOTE — Progress Notes (Addendum)
   NURSE VISIT- NST  SUBJECTIVE:  Monica Cox is a 31 y.o. [phone] female at [redacted]w[redacted]d, here for a NST for pregnancy complicated by Multiple gestation.  She reports active fetal movement, contractions: irritability, vaginal bleeding: none, membranes: intact.   OBJECTIVE:  BP 125/80   Pulse 80   Wt 217 lb (98.4 kg)   LMP 09/21/2021   BMI 38.44 kg/m   Appears well, no apparent distress  No results found for this or any previous visit (from the past 24 hour(s)).  NST: FHR A baseline 155 bpm, Variability: moderate, Accelerations:present, Decelerations:  Absent= Cat 1/reactive FHR B baseline: 145, variability:moderate, Accelerations:present, Decelerations:Absent= Cat 1/reactive  Toco: none   ASSESSMENT: [id number] at [redacted]w[redacted]d with Multiple gestation NST reactive  PLAN: EFM strip reviewed by [name], CNM   Recommendations: keep next appointment as scheduled    Jobe Marker  05/26/2022 4:01 PM  Chart reviewed for nurse visit. Agree with plan of care.  Jacklyn Shell, [place] 05/26/2022 6:19 PM

## 2022-05-26 NOTE — Patient Instructions (Signed)
Monica Cox  05/26/2022   Your procedure is scheduled on:  06/09/2022  Arrive at 0615 at Entrance C on [address] at [facility]  and [employer]. You are invited to use the FREE valet parking or use the Visitor's parking deck.  Pick up the phone at the desk and dial [phone].  Call this number if you have problems the morning of surgery: [phone]  Remember:   Do not eat food:(After Midnight) Desps de medianoche.  Do not drink clear liquids: (After Midnight) Desps de medianoche.  Take these medicines the morning of surgery with A SIP OF WATER:  none   Do not wear jewelry, make-up or nail polish.  Do not wear lotions, powders, or perfumes. Do not wear deodorant.  Do not shave 48 hours prior to surgery.  Do not bring valuables to the hospital.  [facility] is not   responsible for any belongings or valuables brought to the hospital.  Contacts, dentures or bridgework may not be worn into surgery.  Leave suitcase in the car. After surgery it may be brought to your room.  For patients admitted to the hospital, checkout time is 11:00 AM the day of              discharge.      Please read over the following fact sheets that you were given:     Preparing for Surgery

## 2022-05-26 NOTE — Telephone Encounter (Signed)
Preadmission screen  

## 2022-05-27 ENCOUNTER — Other Ambulatory Visit: Payer: Self-pay | Admitting: Obstetrics & Gynecology

## 2022-05-27 DIAGNOSIS — O30043 Twin pregnancy, dichorionic/diamniotic, third trimester: Secondary | ICD-10-CM

## 2022-05-28 ENCOUNTER — Other Ambulatory Visit: Payer: Medicaid Other

## 2022-05-28 ENCOUNTER — Inpatient Hospital Stay (HOSPITAL_COMMUNITY)
Admission: AD | Admit: 2022-05-28 | Discharge: 2022-05-31 | DRG: 786 | Disposition: A | Discharge status: Home / Self Care | Payer: Medicaid Other | Attending: Obstetrics and Gynecology | Admitting: Obstetrics and Gynecology

## 2022-05-28 ENCOUNTER — Encounter (HOSPITAL_COMMUNITY): Payer: Self-pay | Admitting: Obstetrics and Gynecology

## 2022-05-28 ENCOUNTER — Encounter (HOSPITAL_COMMUNITY)
Admission: AD | Disposition: A | Discharge status: Home / Self Care | Payer: Self-pay | Source: Home / Self Care | Attending: Obstetrics and Gynecology

## 2022-05-28 ENCOUNTER — Encounter: Payer: Medicaid Other | Admitting: Obstetrics & Gynecology

## 2022-05-28 ENCOUNTER — Inpatient Hospital Stay (HOSPITAL_COMMUNITY): Payer: Medicaid Other | Admitting: Anesthesiology

## 2022-05-28 ENCOUNTER — Other Ambulatory Visit: Payer: Self-pay

## 2022-05-28 ENCOUNTER — Encounter (HOSPITAL_COMMUNITY): Payer: Self-pay

## 2022-05-28 DIAGNOSIS — O30043 Twin pregnancy, dichorionic/diamniotic, third trimester: Secondary | ICD-10-CM | POA: Diagnosis present

## 2022-05-28 DIAGNOSIS — Z6791 Unspecified blood type, Rh negative: Secondary | ICD-10-CM

## 2022-05-28 DIAGNOSIS — O42913 Preterm premature rupture of membranes, unspecified as to length of time between rupture and onset of labor, third trimester: Principal | ICD-10-CM | POA: Diagnosis present

## 2022-05-28 DIAGNOSIS — O329XX0 Maternal care for malpresentation of fetus, unspecified, not applicable or unspecified: Secondary | ICD-10-CM | POA: Diagnosis not present

## 2022-05-28 DIAGNOSIS — O43123 Velamentous insertion of umbilical cord, third trimester: Secondary | ICD-10-CM | POA: Diagnosis present

## 2022-05-28 DIAGNOSIS — O321XX2 Maternal care for breech presentation, fetus 2: Secondary | ICD-10-CM | POA: Diagnosis not present

## 2022-05-28 DIAGNOSIS — O26893 Other specified pregnancy related conditions, third trimester: Secondary | ICD-10-CM | POA: Diagnosis present

## 2022-05-28 DIAGNOSIS — O42919 Preterm premature rupture of membranes, unspecified as to length of time between rupture and onset of labor, unspecified trimester: Secondary | ICD-10-CM | POA: Diagnosis present

## 2022-05-28 DIAGNOSIS — Z3A36 36 weeks gestation of pregnancy: Secondary | ICD-10-CM | POA: Diagnosis not present

## 2022-05-28 DIAGNOSIS — Z87891 Personal history of nicotine dependence: Secondary | ICD-10-CM

## 2022-05-28 DIAGNOSIS — O9081 Anemia of the puerperium: Secondary | ICD-10-CM | POA: Diagnosis not present

## 2022-05-28 DIAGNOSIS — O329XX2 Maternal care for malpresentation of fetus, unspecified, fetus 2: Secondary | ICD-10-CM

## 2022-05-28 DIAGNOSIS — O9902 Anemia complicating childbirth: Secondary | ICD-10-CM | POA: Diagnosis not present

## 2022-05-28 DIAGNOSIS — O26899 Other specified pregnancy related conditions, unspecified trimester: Principal | ICD-10-CM

## 2022-05-28 DIAGNOSIS — Z87442 Personal history of urinary calculi: Secondary | ICD-10-CM | POA: Diagnosis not present

## 2022-05-28 DIAGNOSIS — O30049 Twin pregnancy, dichorionic/diamniotic, unspecified trimester: Secondary | ICD-10-CM | POA: Diagnosis present

## 2022-05-28 DIAGNOSIS — Z98891 History of uterine scar from previous surgery: Secondary | ICD-10-CM

## 2022-05-28 DIAGNOSIS — D62 Acute posthemorrhagic anemia: Secondary | ICD-10-CM | POA: Diagnosis not present

## 2022-05-28 LAB — COMPREHENSIVE METABOLIC PANEL
ALT: 8 U/L (ref 0–44)
AST: 20 U/L (ref 15–41)
Albumin: 2.1 g/dL — ABNORMAL LOW (ref 3.5–5.0)
Alkaline Phosphatase: 168 U/L — ABNORMAL HIGH (ref 38–126)
Anion gap: 7 (ref 5–15)
BUN: 5 mg/dL — ABNORMAL LOW (ref 6–20)
CO2: 20 mmol/L — ABNORMAL LOW (ref 22–32)
Calcium: 7.8 mg/dL — ABNORMAL LOW (ref 8.9–10.3)
Chloride: 110 mmol/L (ref 98–111)
Creatinine, Ser: 0.82 mg/dL (ref 0.44–1.00)
GFR, Estimated: >60 mL/min (ref >60)
Glucose, Bld: 114 mg/dL — ABNORMAL HIGH (ref 70–99)
Potassium: 2.9 mmol/L — ABNORMAL LOW (ref 3.5–5.1)
Sodium: 137 mmol/L (ref 135–145)
Total Bilirubin: 0.6 mg/dL (ref 0.3–1.2)
Total Protein: 4.6 g/dL — ABNORMAL LOW (ref 6.5–8.1)

## 2022-05-28 LAB — CBC
HCT: 26 % — ABNORMAL LOW (ref 36.0–46.0)
HCT: 20.7 % — ABNORMAL LOW (ref 36.0–46.0)
HCT: 27.8 % — ABNORMAL LOW (ref 36.0–46.0)
Hemoglobin: 8.3 g/dL — ABNORMAL LOW (ref 12.0–15.0)
Hemoglobin: 6.6 g/dL — CL (ref 12.0–15.0)
Hemoglobin: 9.3 g/dL — ABNORMAL LOW (ref 12.0–15.0)
MCH: 26.1 pg (ref 26.0–34.0)
MCH: 26.1 pg (ref 26.0–34.0)
MCH: 27.8 pg (ref 26.0–34.0)
MCHC: 31.9 g/dL (ref 30.0–36.0)
MCHC: 31.9 g/dL (ref 30.0–36.0)
MCHC: 33.5 g/dL (ref 30.0–36.0)
MCV: 81.8 fL (ref 80.0–100.0)
MCV: 81.8 fL (ref 80.0–100.0)
MCV: 83 fL (ref 80.0–100.0)
Platelets: 303 10*3/uL (ref 150–400)
Platelets: 305 10*3/uL (ref 150–400)
Platelets: 182 10*3/uL (ref 150–400)
RBC: 3.18 MIL/uL — ABNORMAL LOW (ref 3.87–5.11)
RBC: 2.53 MIL/uL — ABNORMAL LOW (ref 3.87–5.11)
RBC: 3.35 MIL/uL — ABNORMAL LOW (ref 3.87–5.11)
RDW: 13.7 % (ref 11.5–15.5)
RDW: 13.8 % (ref 11.5–15.5)
RDW: 14.3 % (ref 11.5–15.5)
WBC: 6.2 10*3/uL (ref 4.0–10.5)
WBC: 7.3 10*3/uL (ref 4.0–10.5)
WBC: 18.4 10*3/uL — ABNORMAL HIGH (ref 4.0–10.5)
nRBC: 0.3 % — ABNORMAL HIGH (ref 0.0–0.2)
nRBC: 1 % — ABNORMAL HIGH (ref 0.0–0.2)
nRBC: 0.1 % (ref 0.0–0.2)

## 2022-05-28 LAB — DIC (DISSEMINATED INTRAVASCULAR COAGULATION)PANEL
D-Dimer, Quant: 3.04 ug/mL-FEU — ABNORMAL HIGH (ref 0.00–0.50)
Fibrinogen: 374 mg/dL (ref 210–475)
INR: 1.1 (ref 0.8–1.2)
Platelets: 308 10*3/uL (ref 150–400)
Prothrombin Time: 14.4 seconds (ref 11.4–15.2)
Smear Review: NONE SEEN
aPTT: 29 seconds (ref 24–36)

## 2022-05-28 LAB — URINALYSIS, ROUTINE W REFLEX MICROSCOPIC
Bilirubin Urine: NEGATIVE
Glucose, UA: NEGATIVE mg/dL
Hgb urine dipstick: NEGATIVE
Ketones, ur: NEGATIVE mg/dL
Leukocytes,Ua: NEGATIVE
Nitrite: NEGATIVE
Protein, ur: NEGATIVE mg/dL
Specific Gravity, Urine: 1.011 (ref 1.005–1.030)
pH: 6 (ref 5.0–8.0)

## 2022-05-28 LAB — POCT FERN TEST: POCT Fern Test: POSITIVE

## 2022-05-28 LAB — RPR: RPR Ser Ql: NONREACTIVE

## 2022-05-28 LAB — POSTPARTUM HEMORRHAGE PROTOCOL (BB NOTIFICATION)

## 2022-05-28 SURGERY — Surgical Case
Anesthesia: Spinal

## 2022-05-28 MED ORDER — FENTANYL CITRATE (PF) 100 MCG/2ML IJ SOLN
INTRAMUSCULAR | Status: AC
  Filled 2022-05-28: qty 2

## 2022-05-28 MED ORDER — TETANUS-DIPHTH-ACELL PERTUSSIS 5-2.5-18.5 LF-MCG/0.5 IM SUSY: 0.5000 mL | PREFILLED_SYRINGE | Freq: Once | INTRAMUSCULAR | Status: DC

## 2022-05-28 MED ORDER — SOD CITRATE-CITRIC ACID 500-334 MG/5ML PO SOLN
30.0000 mL | Freq: Once | ORAL | Status: AC
  Administered 2022-05-28: 30 mL via ORAL
  Filled 2022-05-28: qty 30

## 2022-05-28 MED ORDER — DEXAMETHASONE SODIUM PHOSPHATE 4 MG/ML IJ SOLN
INTRAMUSCULAR | Status: DC | PRN
  Administered 2022-05-28: 4 mg via INTRAVENOUS

## 2022-05-28 MED ORDER — IBUPROFEN 600 MG PO TABS
600.0000 mg | ORAL_TABLET | Freq: Four times a day (QID) | ORAL | Status: DC | PRN
Start: 2022-05-28 — End: 2022-05-31
  Administered 2022-05-28 – 2022-05-31 (×10): 600 mg via ORAL
  Filled 2022-05-28 (×9): qty 1

## 2022-05-28 MED ORDER — SODIUM CHLORIDE 0.9 % IV SOLN: INTRAVENOUS | Status: DC | PRN

## 2022-05-28 MED ORDER — PHENYLEPHRINE 80 MCG/ML (10ML) SYRINGE FOR IV PUSH (FOR BLOOD PRESSURE SUPPORT)
PREFILLED_SYRINGE | INTRAVENOUS | Status: AC
  Filled 2022-05-28: qty 10

## 2022-05-28 MED ORDER — DIPHENHYDRAMINE HCL 50 MG/ML IJ SOLN: 12.5000 mg | INTRAMUSCULAR | Status: DC | PRN

## 2022-05-28 MED ORDER — CEFAZOLIN SODIUM-DEXTROSE 2-4 GM/100ML-% IV SOLN
2.0000 g | INTRAVENOUS | Status: AC
  Administered 2022-05-28: 2 g via INTRAVENOUS
  Filled 2022-05-28: qty 100

## 2022-05-28 MED ORDER — DIPHENHYDRAMINE HCL 12.5 MG/5ML PO ELIX: 12.5000 mg | ORAL_SOLUTION | Freq: Four times a day (QID) | ORAL | Status: DC | PRN

## 2022-05-28 MED ORDER — OXYCODONE-ACETAMINOPHEN 5-325 MG PO TABS: 1.0000 | ORAL_TABLET | ORAL | Status: DC | PRN

## 2022-05-28 MED ORDER — SIMETHICONE 80 MG PO CHEW: 80.0000 mg | CHEWABLE_TABLET | ORAL | Status: DC | PRN

## 2022-05-28 MED ORDER — OXYTOCIN-SODIUM CHLORIDE 30-0.9 UT/500ML-% IV SOLN
INTRAVENOUS | Status: DC | PRN
  Administered 2022-05-28: 30 [IU] via INTRAVENOUS

## 2022-05-28 MED ORDER — ALBUMIN HUMAN 5 % IV SOLN: INTRAVENOUS | Status: DC | PRN

## 2022-05-28 MED ORDER — SODIUM CHLORIDE 0.9 % IV SOLN: 10.0000 mL/h | Freq: Once | INTRAVENOUS | Status: DC

## 2022-05-28 MED ORDER — ONDANSETRON HCL 4 MG/2ML IJ SOLN
INTRAMUSCULAR | Status: DC | PRN
  Administered 2022-05-28: 4 mg via INTRAVENOUS

## 2022-05-28 MED ORDER — CEFAZOLIN SODIUM-DEXTROSE 2-4 GM/100ML-% IV SOLN: 2.0000 g | INTRAVENOUS | Status: DC

## 2022-05-28 MED ORDER — HYDROMORPHONE 1 MG/ML IV SOLN
INTRAVENOUS | Status: DC
  Administered 2022-05-28: 0.6 mg via INTRAVENOUS
  Administered 2022-05-28: 30 mg via INTRAVENOUS
  Filled 2022-05-28: qty 30

## 2022-05-28 MED ORDER — MORPHINE SULFATE (PF) 0.5 MG/ML IJ SOLN
INTRAMUSCULAR | Status: DC | PRN
  Administered 2022-05-28: 150 ug via INTRATHECAL

## 2022-05-28 MED ORDER — ENOXAPARIN SODIUM 60 MG/0.6ML IJ SOSY
50.0000 mg | PREFILLED_SYRINGE | INTRAMUSCULAR | Status: DC
  Administered 2022-05-28 – 2022-05-30 (×3): 50 mg via SUBCUTANEOUS
  Filled 2022-05-28 (×3): qty 0.6

## 2022-05-28 MED ORDER — LACTATED RINGERS IV BOLUS: 1000.0000 mL | Freq: Once | INTRAVENOUS | Status: DC

## 2022-05-28 MED ORDER — COCONUT OIL OIL: 1.0000 | TOPICAL_OIL | Status: DC | PRN

## 2022-05-28 MED ORDER — IBUPROFEN 600 MG PO TABS: 600.0000 mg | ORAL_TABLET | Freq: Four times a day (QID) | ORAL | Status: DC

## 2022-05-28 MED ORDER — ACETAMINOPHEN 500 MG PO TABS: 1000.0000 mg | ORAL_TABLET | Freq: Four times a day (QID) | ORAL | Status: DC | PRN

## 2022-05-28 MED ORDER — ESCITALOPRAM OXALATE 10 MG PO TABS
10.0000 mg | ORAL_TABLET | Freq: Every day | ORAL | Status: DC
  Filled 2022-05-28 (×2): qty 1

## 2022-05-28 MED ORDER — ACETAMINOPHEN 500 MG PO TABS
1000.0000 mg | ORAL_TABLET | Freq: Four times a day (QID) | ORAL | Status: DC | PRN
  Administered 2022-05-28 – 2022-05-31 (×2): 1000 mg via ORAL
  Filled 2022-05-28 (×3): qty 2

## 2022-05-28 MED ORDER — DIPHENHYDRAMINE HCL 25 MG PO CAPS: 25.0000 mg | ORAL_CAPSULE | ORAL | Status: DC | PRN

## 2022-05-28 MED ORDER — OXYCODONE HCL 5 MG PO TABS
5.0000 mg | ORAL_TABLET | Freq: Four times a day (QID) | ORAL | Status: DC | PRN
  Administered 2022-05-28 – 2022-05-31 (×10): 10 mg via ORAL
  Filled 2022-05-28 (×11): qty 2

## 2022-05-28 MED ORDER — SIMETHICONE 80 MG PO CHEW
80.0000 mg | CHEWABLE_TABLET | Freq: Three times a day (TID) | ORAL | Status: DC
  Administered 2022-05-28 – 2022-05-31 (×8): 80 mg via ORAL
  Filled 2022-05-28 (×7): qty 1

## 2022-05-28 MED ORDER — DEXMEDETOMIDINE (PRECEDEX) IN NS 20 MCG/5ML (4 MCG/ML) IV SYRINGE
PREFILLED_SYRINGE | INTRAVENOUS | Status: DC | PRN
  Administered 2022-05-28: 8 ug via INTRAVENOUS

## 2022-05-28 MED ORDER — NALOXONE HCL 0.4 MG/ML IJ SOLN
0.4000 mg | INTRAMUSCULAR | Status: DC | PRN
Start: 2022-05-28 — End: 2022-05-28

## 2022-05-28 MED ORDER — DIPHENHYDRAMINE HCL 25 MG PO CAPS
25.0000 mg | ORAL_CAPSULE | Freq: Four times a day (QID) | ORAL | Status: DC | PRN
  Administered 2022-05-29: 25 mg via ORAL
  Filled 2022-05-28: qty 1

## 2022-05-28 MED ORDER — SODIUM CHLORIDE 0.9 % IR SOLN
Status: DC | PRN
  Administered 2022-05-28: 1

## 2022-05-28 MED ORDER — POVIDONE-IODINE 10 % EX SWAB: 2.0000 | Freq: Once | CUTANEOUS | Status: DC

## 2022-05-28 MED ORDER — TERBUTALINE SULFATE 1 MG/ML IJ SOLN
0.2500 mg | Freq: Once | INTRAMUSCULAR | Status: AC
  Administered 2022-05-28: 0.25 mg via SUBCUTANEOUS
  Filled 2022-05-28: qty 1

## 2022-05-28 MED ORDER — ONDANSETRON HCL 4 MG/2ML IJ SOLN
INTRAMUSCULAR | Status: AC
  Filled 2022-05-28: qty 2

## 2022-05-28 MED ORDER — SODIUM CHLORIDE 0.9% FLUSH
9.0000 mL | INTRAVENOUS | Status: DC | PRN
Start: 2022-05-28 — End: 2022-05-28

## 2022-05-28 MED ORDER — OXYTOCIN-SODIUM CHLORIDE 30-0.9 UT/500ML-% IV SOLN
INTRAVENOUS | Status: AC
  Filled 2022-05-28: qty 500

## 2022-05-28 MED ORDER — MENTHOL 3 MG MT LOZG: 1.0000 | LOZENGE | OROMUCOSAL | Status: DC | PRN

## 2022-05-28 MED ORDER — DEXAMETHASONE SODIUM PHOSPHATE 4 MG/ML IJ SOLN
INTRAMUSCULAR | Status: AC
  Filled 2022-05-28: qty 1

## 2022-05-28 MED ORDER — MORPHINE SULFATE (PF) 0.5 MG/ML IJ SOLN
INTRAMUSCULAR | Status: AC
  Filled 2022-05-28: qty 10

## 2022-05-28 MED ORDER — PHENYLEPHRINE HCL (PRESSORS) 10 MG/ML IV SOLN
INTRAVENOUS | Status: DC | PRN
  Administered 2022-05-28 (×4): 80 ug via INTRAVENOUS
  Administered 2022-05-28: 160 ug via INTRAVENOUS

## 2022-05-28 MED ORDER — LACTATED RINGERS IV BOLUS
1000.0000 mL | Freq: Once | INTRAVENOUS | Status: AC
  Administered 2022-05-28: 1000 mL via INTRAVENOUS

## 2022-05-28 MED ORDER — WITCH HAZEL-GLYCERIN EX PADS: 1.0000 | MEDICATED_PAD | CUTANEOUS | Status: DC | PRN

## 2022-05-28 MED ORDER — SCOPOLAMINE 1 MG/3DAYS TD PT72
MEDICATED_PATCH | TRANSDERMAL | Status: AC
  Filled 2022-05-28: qty 1

## 2022-05-28 MED ORDER — NALOXONE HCL 0.4 MG/ML IJ SOLN: 0.4000 mg | INTRAMUSCULAR | Status: DC | PRN

## 2022-05-28 MED ORDER — FENTANYL CITRATE (PF) 100 MCG/2ML IJ SOLN
INTRAMUSCULAR | Status: DC | PRN
  Administered 2022-05-28: 35 ug via INTRAVENOUS
  Administered 2022-05-28: 50 ug via INTRAVENOUS

## 2022-05-28 MED ORDER — ONDANSETRON HCL 4 MG/2ML IJ SOLN: 4.0000 mg | Freq: Four times a day (QID) | INTRAMUSCULAR | Status: DC | PRN

## 2022-05-28 MED ORDER — FENTANYL CITRATE (PF) 100 MCG/2ML IJ SOLN
INTRAMUSCULAR | Status: DC | PRN
Start: 2022-05-28 — End: 2022-05-28
  Administered 2022-05-28: 15 ug via INTRATHECAL

## 2022-05-28 MED ORDER — SENNOSIDES-DOCUSATE SODIUM 8.6-50 MG PO TABS
2.0000 | ORAL_TABLET | Freq: Every day | ORAL | Status: DC
  Administered 2022-05-29 – 2022-05-31 (×3): 2 via ORAL
  Filled 2022-05-28 (×3): qty 2

## 2022-05-28 MED ORDER — BUPIVACAINE IN DEXTROSE 0.75-8.25 % IT SOLN
INTRATHECAL | Status: DC | PRN
  Administered 2022-05-28: 1.6 mL via INTRATHECAL

## 2022-05-28 MED ORDER — MORPHINE SULFATE (PF) 0.5 MG/ML IJ SOLN
INTRAMUSCULAR | Status: DC | PRN
  Administered 2022-05-28: 1 mg via EPIDURAL

## 2022-05-28 MED ORDER — DIBUCAINE (PERIANAL) 1 % EX OINT: 1.0000 | TOPICAL_OINTMENT | CUTANEOUS | Status: DC | PRN

## 2022-05-28 MED ORDER — SCOPOLAMINE 1 MG/3DAYS TD PT72: 1.0000 | MEDICATED_PATCH | Freq: Once | TRANSDERMAL | Status: DC

## 2022-05-28 MED ORDER — FENTANYL CITRATE (PF) 100 MCG/2ML IJ SOLN
25.0000 ug | INTRAMUSCULAR | Status: DC | PRN
  Administered 2022-05-28 (×3): 50 ug via INTRAVENOUS

## 2022-05-28 MED ORDER — KETOROLAC TROMETHAMINE 30 MG/ML IJ SOLN: 30.0000 mg | Freq: Once | INTRAMUSCULAR | Status: DC | PRN

## 2022-05-28 MED ORDER — DEXMEDETOMIDINE HCL IN NACL 80 MCG/20ML IV SOLN
INTRAVENOUS | Status: AC
  Filled 2022-05-28: qty 20

## 2022-05-28 MED ORDER — OXYTOCIN-SODIUM CHLORIDE 30-0.9 UT/500ML-% IV SOLN: 2.5000 [IU]/h | INTRAVENOUS | Status: AC

## 2022-05-28 MED ORDER — LACTATED RINGERS IV SOLN: INTRAVENOUS | Status: DC

## 2022-05-28 MED ORDER — ONDANSETRON HCL 4 MG/2ML IJ SOLN: 4.0000 mg | Freq: Three times a day (TID) | INTRAMUSCULAR | Status: DC | PRN

## 2022-05-28 MED ORDER — DIPHENHYDRAMINE HCL 50 MG/ML IJ SOLN: 12.5000 mg | Freq: Four times a day (QID) | INTRAMUSCULAR | Status: DC | PRN

## 2022-05-28 MED ORDER — STERILE WATER FOR IRRIGATION IR SOLN
Status: DC | PRN
  Administered 2022-05-28: 1000 mL

## 2022-05-28 MED ORDER — NALOXONE HCL 4 MG/10ML IJ SOLN: 1.0000 ug/kg/h | INTRAVENOUS | Status: DC | PRN

## 2022-05-28 MED ORDER — FAMOTIDINE IN NACL 20-0.9 MG/50ML-% IV SOLN
20.0000 mg | Freq: Once | INTRAVENOUS | Status: AC
  Administered 2022-05-28: 20 mg via INTRAVENOUS
  Filled 2022-05-28: qty 50

## 2022-05-28 MED ORDER — PRENATAL MULTIVITAMIN CH
1.0000 | ORAL_TABLET | Freq: Every day | ORAL | Status: DC
  Administered 2022-05-29: 1 via ORAL
  Filled 2022-05-28 (×2): qty 1

## 2022-05-28 MED ORDER — SOD CITRATE-CITRIC ACID 500-334 MG/5ML PO SOLN
30.0000 mL | Freq: Once | ORAL | Status: DC
  Filled 2022-05-28: qty 30

## 2022-05-28 MED ORDER — MEPERIDINE HCL 25 MG/ML IJ SOLN: 6.2500 mg | INTRAMUSCULAR | Status: DC | PRN

## 2022-05-28 MED ORDER — PHENYLEPHRINE HCL-NACL 20-0.9 MG/250ML-% IV SOLN
INTRAVENOUS | Status: DC | PRN
  Administered 2022-05-28: 60 ug/min via INTRAVENOUS

## 2022-05-28 MED ORDER — SODIUM CHLORIDE 0.9% FLUSH: 3.0000 mL | INTRAVENOUS | Status: DC | PRN

## 2022-05-28 MED ORDER — SODIUM CHLORIDE 0.9 % IV SOLN
500.0000 mg | INTRAVENOUS | Status: AC
  Administered 2022-05-28: 500 mg via INTRAVENOUS

## 2022-05-28 MED ORDER — SCOPOLAMINE 1 MG/3DAYS TD PT72
MEDICATED_PATCH | TRANSDERMAL | Status: DC | PRN
  Administered 2022-05-28: 1 via TRANSDERMAL

## 2022-05-28 MED ORDER — PHENYLEPHRINE HCL-NACL 20-0.9 MG/250ML-% IV SOLN
INTRAVENOUS | Status: AC
  Filled 2022-05-28: qty 250

## 2022-05-28 MED ORDER — SODIUM CHLORIDE 0.9 % IV SOLN
INTRAVENOUS | Status: AC
  Filled 2022-05-28: qty 5

## 2022-05-28 SURGICAL SUPPLY — 39 items
ADH SKN CLS APL DERMABOND .7 (GAUZE/BANDAGES/DRESSINGS)
APL SKNCLS STERI-STRIP NONHPOA (GAUZE/BANDAGES/DRESSINGS)
BENZOIN TINCTURE PRP APPL 2/3 (GAUZE/BANDAGES/DRESSINGS) ×1 IMPLANT
CHLORAPREP W/TINT 26ML (MISCELLANEOUS) ×2 IMPLANT
CLAMP CORD UMBIL (MISCELLANEOUS) ×1 IMPLANT
CLOTH BEACON ORANGE TIMEOUT ST (SAFETY) ×1 IMPLANT
DERMABOND ADVANCED (GAUZE/BANDAGES/DRESSINGS)
DERMABOND ADVANCED .7 DNX12 (GAUZE/BANDAGES/DRESSINGS) IMPLANT
DRSG OPSITE POSTOP 4X10 (GAUZE/BANDAGES/DRESSINGS) ×1 IMPLANT
ELECT REM PT RETURN 9FT ADLT (ELECTROSURGICAL) ×1
ELECTRODE REM PT RTRN 9FT ADLT (ELECTROSURGICAL) ×1 IMPLANT
EXTRACTOR VACUUM KIWI (MISCELLANEOUS) IMPLANT
GAUZE SPONGE 4X4 12PLY STRL LF (GAUZE/BANDAGES/DRESSINGS) IMPLANT
GLOVE BIOGEL PI IND STRL 7.0 (GLOVE) ×3 IMPLANT
GLOVE BIOGEL PI INDICATOR 7.0 (GLOVE) ×3
GLOVE ECLIPSE 6.5 STRL STRAW (GLOVE) ×1 IMPLANT
GOWN STRL REUS W/TWL LRG LVL3 (GOWN DISPOSABLE) ×3 IMPLANT
HEMOSTAT ARISTA ABSORB 3G PWDR (HEMOSTASIS) IMPLANT
KIT ABG SYR 3ML LUER SLIP (SYRINGE) IMPLANT
NDL HYPO 25X5/8 SAFETYGLIDE (NEEDLE) IMPLANT
NEEDLE HYPO 25X5/8 SAFETYGLIDE (NEEDLE) IMPLANT
NS IRRIG 1000ML POUR BTL (IV SOLUTION) ×1 IMPLANT
PACK C SECTION WH (CUSTOM PROCEDURE TRAY) ×1 IMPLANT
PAD ABD 7.5X8 STRL (GAUZE/BANDAGES/DRESSINGS) ×1 IMPLANT
PAD OB MATERNITY 4.3X12.25 (PERSONAL CARE ITEMS) ×1 IMPLANT
RTRCTR C-SECT PINK 25CM LRG (MISCELLANEOUS) ×1 IMPLANT
STRIP CLOSURE SKIN 1/2X4 (GAUZE/BANDAGES/DRESSINGS) ×1 IMPLANT
SUT PLAIN 0 NONE (SUTURE) IMPLANT
SUT PLAIN 2 0 XLH (SUTURE) IMPLANT
SUT VIC AB 0 CT1 27 (SUTURE) ×2
SUT VIC AB 0 CT1 27XBRD ANBCTR (SUTURE) ×2 IMPLANT
SUT VIC AB 0 CTX 36 (SUTURE) ×3
SUT VIC AB 0 CTX36XBRD ANBCTRL (SUTURE) ×3 IMPLANT
SUT VIC AB 2-0 CT1 27 (SUTURE) ×1
SUT VIC AB 2-0 CT1 TAPERPNT 27 (SUTURE) ×1 IMPLANT
SUT VIC AB 4-0 KS 27 (SUTURE) ×1 IMPLANT
TOWEL OR 17X24 6PK STRL BLUE (TOWEL DISPOSABLE) ×1 IMPLANT
TRAY FOLEY W/BAG SLVR 14FR LF (SET/KITS/TRAYS/PACK) IMPLANT
WATER STERILE IRR 1000ML POUR (IV SOLUTION) ×1 IMPLANT

## 2022-05-28 NOTE — Progress Notes (Signed)
Rate increased 200 ml/hr.

## 2022-05-28 NOTE — Op Note (Signed)
Monica Cox PROCEDURE DATE: 05/28/2022  PREOPERATIVE DIAGNOSIS: Intrauterine pregnancy at  [redacted]w[redacted]d weeks gestation; di-di twin pregnancy, pprom with fetal malpresentation in twin B inlabor  POSTOPERATIVE DIAGNOSIS: The same with postpartum hemorrhage  PROCEDURE:     Cesarean Section  SURGEON:  Dr. Gigi Gin Sincere Liuzzi  ASSISTANT: none  INDICATIONS: Monica Cox is a 31 y.o. [id number] at [redacted]w[redacted]d scheduled for cesarean section secondary to di-di twin pregnancy, fetal malpresentation in twin B with PPROM in labor.  The risks of cesarean section discussed with the patient included but were not limited to: bleeding which may require transfusion or reoperation; infection which may require antibiotics; injury to bowel, bladder, ureters or other surrounding organs; injury to the fetus; need for additional procedures including hysterectomy in the event of a life-threatening hemorrhage; placental abnormalities wth subsequent pregnancies, incisional problems, thromboembolic phenomenon and other postoperative/anesthesia complications. The patient concurred with the proposed plan, giving informed written consent for the procedure.    FINDINGS:  Viable female and female infant in cephalic and breech presentation respectively .  Apgars 8 and 9 for twin A and Apgars 6 and 8 for twin B.  Clear amniotic fluid x 2.  Intact placenta, three vessel cord.  Normal uterus, fallopian tubes and ovaries bilaterally.  ANESTHESIA:    Spinal INTRAVENOUS FLUIDS:1000 ml and 2 units of pRBC ESTIMATED BLOOD LOSS: 3010 ml URINE OUTPUT:  50 ml SPECIMENS: Placenta sent to pathology COMPLICATIONS: None immediate  PROCEDURE IN DETAIL:  The patient received intravenous antibiotics and had sequential compression devices applied to her lower extremities while in the preoperative area.  She was then taken to the operating room where anesthesia was induced and was found to be adequate. A foley catheter was placed into her bladder and  attached to Monica Cox gravity. She was then placed in a dorsal supine position with a leftward tilt, and prepped and draped in a sterile manner. After an adequate timeout was performed, a Pfannenstiel skin incision was made with scalpel and carried through to the underlying layer of fascia. The fascia was incised in the midline and this incision was extended bilaterally bluntly. The rectus muscles were separated in the midline bluntly and the peritoneum was entered bluntly. The Alexis self-retaining retractor was introduced into the abdominal cavity. Attention was turned to the lower uterine segment where a transverse hysterotomy was made with a scalpel and extended bilaterally bluntly. The infant was successfully delivered and delayed cord clamping was performed for 1 minute on twin A. The cord was clamped and cut and infant was handed over to awaiting neonatology team. Twin B delivered in a breech fashion. The cord was clamped and cut and infant handed to awaiting pediatricians. Uterine massage was then administered and the placenta delivered intact with three-vessel cord. The uterus was cleared of clot and debris.  Right uterine extension was noted which contributed to excessive blood loss. Extension repaired with 0-Vicryl with excellent hemostasis. The hysterotomy was closed with 0 Vicryl in a running locked fashion, and an imbricating layer was also placed with a 0 Vicryl. Overall, excellent hemostasis was noted. Hemostasis was confirmed on all surfaces.  The peritoneum was reapproximated using 0 vicryl interrupted stitches. The fascia was then closed using 0 Vicryl in a running fashion.  The skin was closed in a subcuticular fashion using 3.0 Vicryl. The patient tolerated the procedure well. Sponge, lap, instrument and needle counts were correct x 2. She was taken to the recovery room in stable condition.    Monica Cox ConstantMD  05/28/2022 5:45 AM

## 2022-05-28 NOTE — MAU Note (Signed)
Dr Constant at bedside. 

## 2022-05-28 NOTE — Transfer of Care (Signed)
Immediate Anesthesia Transfer of Care Note  Patient: Monica Cox  Procedure(s) Performed: CESAREAN SECTION  Patient Location: PACU  Anesthesia Type:Spinal  Level of Consciousness: awake, alert  and oriented  Airway & Oxygen Therapy: Patient Spontanous Breathing  Post-op Assessment: Report given to RN  Post vital signs: Reviewed and stable  Last Vitals:  Vitals Value Taken Time  BP 113/73 05/28/22 0615  Temp 36.4 C 05/28/22 0611  Pulse 74 05/28/22 0628  Resp 14 05/28/22 0628  SpO2 100 % 05/28/22 0628  Vitals shown include unvalidated device data.  Last Pain:  Vitals:   05/28/22 0611  TempSrc: Oral  PainSc:       Patients Stated Pain Goal: 0 (05/28/22 0230)  Complications: No notable events documented.

## 2022-05-28 NOTE — MAU Note (Signed)
Alecia RN OB OR CN aware of Hgb of 8.3. Dr Nance Pew in to see pt

## 2022-05-28 NOTE — Anesthesia Preprocedure Evaluation (Signed)
Anesthesia Evaluation  Patient identified by MRN, date of birth, ID band Patient awake    Reviewed: Allergy & Precautions, NPO status , Patient's Chart, lab work & pertinent test results  Airway Mallampati: II  TM Distance: >3 FB Neck ROM: Full    Dental no notable dental hx.    Pulmonary asthma , former smoker,    Pulmonary exam normal        Cardiovascular hypertension, negative cardio ROS   Rhythm:Regular Rate:Normal     Neuro/Psych Anxiety Depression negative neurological ROS     GI/Hepatic Neg liver ROS, GERD  Medicated,  Endo/Other  negative endocrine ROS  Renal/GU negative Renal ROS  negative genitourinary   Musculoskeletal   Abdominal Normal abdominal exam  (+)   Peds  Hematology  (+) Blood dyscrasia, anemia ,   Anesthesia Other Findings   Reproductive/Obstetrics (+) Pregnancy Di-di twins cephalic/breech                             Anesthesia Physical Anesthesia Plan  ASA: 2  Anesthesia Plan: Spinal   Post-op Pain Management:    Induction:   PONV Risk Score and Plan: 2 and Treatment may vary due to age or medical condition  Airway Management Planned: Natural Airway, Simple Face Mask and Nasal Cannula  Additional Equipment: None  Intra-op Plan:   Post-operative Plan:   Informed Consent: I have reviewed the patients History and Physical, chart, labs and discussed the procedure including the risks, benefits and alternatives for the proposed anesthesia with the patient or authorized representative who has indicated his/her understanding and acceptance.     Dental advisory given  Plan Discussed with: CRNA  Anesthesia Plan Comments: (Lab Results      Component                Value               Date                      WBC                      6.2                 05/28/2022                HGB                      8.3 (L)             05/28/2022                HCT                       26.0 (L)            05/28/2022                MCV                      81.8                05/28/2022                PLT                      303  05/28/2022           Lab Results      Component                Value               Date                      NA                       137                 12/09/2021                K                        3.7                 12/09/2021                CO2                      21                  12/09/2021                GLUCOSE                  77                  12/09/2021                BUN                      6                   12/09/2021                CREATININE               0.58                12/09/2021                CALCIUM                  9.4                 12/09/2021                EGFR                     123                 12/09/2021                GFRNONAA                 >60                 11/04/2021          )        Anesthesia Quick Evaluation

## 2022-05-28 NOTE — Discharge Summary (Shared)
Postpartum Discharge Summary  Date of Service 05/30/22     Patient Name: Monica Cox DOB: [birth year] MRN: [id number]  Date of admission: 05/28/2022 Delivery date:   Averly, Ericson [[id number]]  05/28/2022    Brooklee, Michelin [[id number]]  05/28/2022 Delivering provider:    Shammara, Jarrett [[id number]]  CONSTANT, PEGGY    Thandiwe, Siragusa [[id number]]  CONSTANT, PEGGY Date of discharge: 05/30/2022  Admitting diagnosis: S/P cesarean section [Z98.891] Preterm premature rupture of membranes (PPROM) with unknown onset of labor [O42.919] Intrauterine pregnancy: [redacted]w[redacted]d    Secondary diagnosis:  Principal Problem:   S/P cesarean section Active Problems:   Preterm premature rupture of membranes (PPROM) with unknown onset of labor  Additional problems: fetal malpresentation of twin B and intraoperative postpartum hemorrhage    Discharge diagnosis: Term Pregnancy Delivered and PPH                                              Post partum procedures:blood transfusion Complications: [id number] Hospital course: Sceduled C/S   31y.o. yo [id number] admitted to the hospital 05/28/2022 for scheduled cesarean section with the following indication: Di-Di twin with fetal malpresentation in twin B, PPROM with onset of labor.Delivery details are as follows:  Membrane Rupture Time/Date:    PiSantana, Gosdin0[[id number]]1:09 AM    PiVaidehi, Braddy0[[id number]]1:09 AM ,   PiBriante, Loveall0[[id number]]05/28/2022    PiAnnalysse, Shoemaker0[[id number]]05/28/2022   Delivery Method:   PiJeoffrey Massed0[[id number]]C-Section, Low Transverse    PiStanley, Helmuth0[[id number]]C-Section, Low Transverse Details of operation can be found in separate operative note.  Patient experienced a hemorrhage secondary to an extensive right uterine laceration. Patient received 4 units pRBC. Patient had an  uncomplicated postpartum course.  She is ambulating, tolerating a regular diet, passing flatus, and urinating well. Patient is discharged home in stable condition on  05/30/22        Newborn Data: Birth date:   PiJennine, Peddy0[[id number]]05/28/2022    PiShayleigh, Bouldin0[[id number]]05/28/2022 Birth time:   PiArlissa, Monteverde0[[id number]]5:02 AM    PiEdmonia Lynch0[[id number]]4:56 AM Gender:   PiFloy, Angert0[[id number]]Female    PiGowri, Suchan0[[id number]]Female Living status:   PiVanissa, Strength0[[id number]]Living    PiAnnabeth, Tortora0[[id number]]Living Apgars:   PiJesseca, Marsch0[[id number]][address][[id number]]   ,   MKLKJZP-HXT, AVWPV XYIAXK0[[id number]]  7  PiKamyra, Schroeck0[[id number]] Weight:   PiCarisma, Troupe0[[id number]]2350 g    PiMolley, Houser0[[id number]]2600 g     Magnesium Sulfate received: No BMZ received: No Rhophylac:Yes MMR: rubella immune 12/09/21 T-DaP:Given prenatally Flu: No Transfusion:Yes  Physical exam  Vitals:   05/29/22 0630 05/29/22 1420 05/29/22 2055 05/30/22 0650  BP: 134/85 129/80 117/63 126/65  Pulse: 66 66 73 73  Resp: '18 18 16 16  ' Temp: 98.4 F (36.9 C) 98.2 F (36.8 C) 98.2 F (36.8 C) 98 F (36.7 C)  TempSrc: Oral Oral Oral Oral  SpO2: 100%  100%   Weight:      Height:  General: alert, cooperative, and no distress Lochia: appropriate Uterine Fundus: firm Incision: Dressing is clean, dry, and intact DVT Evaluation: No evidence of DVT seen on physical exam. Labs: Lab Results  Component Value Date   WBC 11.3 (H) 05/30/2022   HGB 7.4 (L) 05/30/2022   HCT 21.8 (L) 05/30/2022   MCV 82.3 05/30/2022   PLT 197 05/30/2022      Latest Ref Rng & Units 05/28/2022    5:40 AM  CMP  Glucose 70 - 99 mg/dL 114   BUN 6 - 20 mg/dL 5   Creatinine 0.44 - 1.00 mg/dL 0.82    Sodium 135 - 145 mmol/L 137   Potassium 3.5 - 5.1 mmol/L 2.9   Chloride 98 - 111 mmol/L 110   CO2 22 - 32 mmol/L 20   Calcium 8.9 - 10.3 mg/dL 7.8   Total Protein 6.5 - 8.1 g/dL 4.6   Total Bilirubin 0.3 - 1.2 mg/dL 0.6   Alkaline Phos 38 - 126 U/L 168   AST 15 - 41 U/L 20   ALT 0 - 44 U/L 8    Edinburgh Score:    03/10/2018    4:20 PM  Edinburgh Postnatal Depression Scale Screening Tool  I have been able to laugh and see the funny side of things. 0  I have looked forward with enjoyment to things. 0  I have blamed myself unnecessarily when things went wrong. 3  I have been anxious or worried for no good reason. 2  I have felt scared or panicky for no good reason. 1  Things have been getting on top of me. 0  I have been so unhappy that I have had difficulty sleeping. 0  I have felt sad or miserable. 0  I have been so unhappy that I have been crying. 0  The thought of harming myself has occurred to me. 0  Edinburgh Postnatal Depression Scale Total 6     After visit meds:  Allergies as of 05/30/2022       Reactions   Sulfa Antibiotics Nausea And Vomiting   Tape Other (See Comments)   Tears skin     Med Rec must be completed prior to using this [facility]***        Discharge home in stable condition Infant Feeding: Breast Infant Disposition:home with mother Discharge instruction: per After Visit Summary and Postpartum booklet. Activity: Advance as tolerated. Pelvic rest for 6 weeks.  Diet: routine diet Future Appointments: Future Appointments  Date Time Provider [place]  06/12/2022  2:10 PM Christin Fudge, CNM [facility]  07/03/2022 11:50 AM Cresenzo-Dishmon, Joaquim Lai, CNM [facility]   Follow up Visit:   Please schedule this patient for a {Visit type:23955} postpartum visit in {Postpartum visit:23953} with the following provider: {Provider type:23954}. Additional Postpartum F/U:{PP Procedure:23957}  {Risk VVKPQ:24497} pregnancy  complicated by: {NPYYFRTMYTRZ:73567} Delivery mode:     Taliyah, Watrous [[id number]]  C-Section, Low Transverse    Hiyab, Nhem [[id number]]  C-Section, Low Transverse Anticipated Birth Control:  {Birth Control:23956}   05/30/2022 Salvadore Oxford, MD, PGY-1

## 2022-05-28 NOTE — MAU Note (Addendum)
.  Monica Cox is a 31 y.o. at [redacted]w[redacted]d here in MAU reporting srom at 0109 and strong ctxs started afteward. Good FM of both babies. Baby A is vtx and B breech. Desires c/s. Last ate at 2330 (banana pudding) and drank sip of pineapple-orange soda at 0100  Onset of complaint: 0109 Pain score: 8 Vitals:   05/28/22 0227 05/28/22 0228  BP:  138/79  Pulse: 84   SpO2: 100%      FHT:A 143 B 138 Lab orders placed from triage:  labor eval for mau

## 2022-05-28 NOTE — Anesthesia Procedure Notes (Signed)
Spinal  Patient location during procedure: OR Start time: 05/28/2022 4:29 AM End time: 05/28/2022 4:30 AM Staffing Performed: anesthesiologist  Anesthesiologist: Atilano Median, DO Performed by: Atilano Median, DO Authorized by: Atilano Median, DO   Preanesthetic Checklist Completed: patient identified, IV checked, site marked, risks and benefits discussed, surgical consent, monitors and equipment checked, pre-op evaluation and timeout performed Spinal Block Patient position: sitting Prep: DuraPrep Patient monitoring: heart rate, cardiac monitor, continuous pulse ox and blood pressure Approach: midline Location: L4-5 Injection technique: single-shot Needle Needle type: Pencan  Needle gauge: 24 G Needle length: 10 cm Assessment Events: CSF return Additional Notes Patient identified. Risks/Benefits/Options discussed with patient including but not limited to bleeding, infection, nerve damage, paralysis, failed block, incomplete pain control, headache, blood pressure changes, nausea, vomiting, reactions to medications, itching and postpartum back pain. Confirmed with bedside nurse the patient's most recent platelet count. Confirmed with patient that they are not currently taking any anticoagulation, have any bleeding history or any family history of bleeding disorders. Patient expressed understanding and wished to proceed. All questions were answered. Sterile technique was used throughout the entire procedure. Please see nursing notes for vital signs. Warning signs of high block given to the patient including shortness of breath, tingling/numbness in hands, complete motor block, or any concerning symptoms with instructions to call for help. Patient was given instructions on fall risk and not to get out of bed. All questions and concerns addressed with instructions to call with any issues or inadequate analgesia.

## 2022-05-28 NOTE — H&P (Signed)
Monica Cox is a 31 y.o. female [phone] at [redacted]w[redacted]d presenting for PPROM and onset of labor. Patient with prenatal care at [facility] complicated by Di/Di twins with marginal cord insertion for twin B. Patient reports rupture of membrane around 1 am with onset of painful contractions. She was scheduled for a primary cesarean section due to cephalic/breech presentation at 38 weeks. Patient is without any other complaints. She reports good fetal movement x 2  OB History     Gravida  6   Para  4   Term  4   Preterm  0   AB  1   Living  4      SAB  1   IAB  0   Ectopic  0   Multiple      Live Births  4          Past Medical History:  Diagnosis Date   Anemia    Asthma    inhaler as needed   Depression    GERD (gastroesophageal reflux disease)    H/O trichomonas 08/08/2014   2015 pregnancy    History of chlamydia 03/19/2016   2015 pregnancy    History of kidney stones    Mild to moderate pre-eclampsia, postpartum    Nausea 02/06/2016   Postpartum hypertension    pp htn   Reflux    Smoker 02/06/2016   Trichomonas infection 08/08/2014   Vaginal Pap smear, abnormal    Vertigo 09/21/2014   antivert rx 09/21/2014     Past Surgical History:  Procedure Laterality Date   DILATION AND CURETTAGE OF UTERUS     [facility] Dr. Despina Hidden performed   ESOPHAGOGASTRODUODENOSCOPY (EGD) WITH PROPOFOL N/A 09/23/2018   Procedure: ESOPHAGOGASTRODUODENOSCOPY (EGD) WITH PROPOFOL;  Surgeon: Corbin Ade, MD;  Location: [facility];  Service: Endoscopy;  Laterality: N/A;  8:45am   ESOPHAGOGASTRODUODENOSCOPY ENDOSCOPY     Family History: family history includes Diabetes in her paternal grandmother; Hypertension in her father and mother; Ulcers in her father, paternal aunt, and paternal grandmother. Social History:  reports that she quit smoking about 5 years ago. Her smoking use included cigars. She has never used smokeless tobacco. She reports that she does not currently use alcohol.  She reports that she does not use drugs.     Maternal Diabetes: No Genetic Screening: Normal Maternal Ultrasounds/Referrals: Normal Fetal Ultrasounds or other Referrals:  None Maternal Substance Abuse:  No Significant Maternal Medications:  None Significant Maternal Lab Results:  Rh negative Number of Prenatal Visits:greater than 3 verified prenatal visits Other Comments:  None  Review of Systems See pertinent in HPI History Dilation: 5 Effacement (%): 70 Station: -2 Exam by:: Felipa Furnace RN Blood pressure 135/83, pulse 88, resp. rate 17, height 5' 3.5" (1.613 m), weight 99.3 kg, last menstrual period 09/21/2021, SpO2 100 %. Exam Physical Exam  GENERAL: Well-developed, well-nourished female in no acute distress.  LUNGS: Clear to auscultation bilaterally.  HEART: Regular rate and rhythm. ABDOMEN: Soft, nontender, gravid PELVIC: performed by RN EXTREMITIES: No cyanosis, clubbing, or edema, 2+ distal pulses.  FHR: baseline 140/150, mod variability, +accels, no decels TOCO: irregular contraction q 2-5 minutes  Prenatal labs: ABO, Rh: --/--/PENDING (08/23 0300) Antibody: PENDING (08/23 0300) Rubella: 2.03 (03/06 1344) RPR: Non Reactive (06/29 0823)  HBsAg: Negative (03/06 1344)  HIV: Non Reactive (06/29 0823)  GBS:     Assessment/Plan: 31 yo P4014 at [redacted]w[redacted]d with PPROM and active labor with Di-Di twins - Fetal presentation are cephalic /  breech and patient desires primary cesarean section  - Terbutaline given in the hopes of delaying labor - OR staff notified - Patient plans birth control pills for contraception - Risks, benefits and alternatives were explained including but not limited to risks of bleeding, infection, damage to adjacent organs. Patient verbalized understanding and all questions were answered. Consent signed  Tashianna Broome 05/28/2022, 3:42 AM

## 2022-05-28 NOTE — Progress Notes (Signed)
Dilaudid PCA d/c per md order. Dilaudid 27 ml wasted in steri-cycle. Witnessed by Henderson Newcomer RN. Will continue to monitor pt.

## 2022-05-29 ENCOUNTER — Encounter (HOSPITAL_COMMUNITY): Payer: Self-pay | Admitting: Obstetrics and Gynecology

## 2022-05-29 LAB — CBC
HCT: 22.9 % — ABNORMAL LOW (ref 36.0–46.0)
Hemoglobin: 7.7 g/dL — ABNORMAL LOW (ref 12.0–15.0)
MCH: 27.9 pg (ref 26.0–34.0)
MCHC: 33.6 g/dL (ref 30.0–36.0)
MCV: 83 fL (ref 80.0–100.0)
Platelets: 187 10*3/uL (ref 150–400)
RBC: 2.76 MIL/uL — ABNORMAL LOW (ref 3.87–5.11)
RDW: 14.5 % (ref 11.5–15.5)
WBC: 15.3 10*3/uL — ABNORMAL HIGH (ref 4.0–10.5)
nRBC: 0.1 % (ref 0.0–0.2)

## 2022-05-29 MED ORDER — SODIUM CHLORIDE 0.9 % IV SOLN
500.0000 mg | Freq: Once | INTRAVENOUS | Status: AC
  Administered 2022-05-29: 500 mg via INTRAVENOUS
  Filled 2022-05-29: qty 500

## 2022-05-29 MED ORDER — RHO D IMMUNE GLOBULIN 1500 UNIT/2ML IJ SOSY
300.0000 ug | PREFILLED_SYRINGE | Freq: Once | INTRAMUSCULAR | Status: AC
Start: 2022-05-29 — End: 2022-05-29
  Administered 2022-05-29: 300 ug via INTRAVENOUS
  Filled 2022-05-29: qty 2

## 2022-05-29 NOTE — Anesthesia Postprocedure Evaluation (Signed)
Anesthesia Post Note  Patient: Monica Cox  Procedure(s) Performed: CESAREAN SECTION     Patient location during evaluation: PACU Anesthesia Type: Spinal Level of consciousness: awake and alert Pain management: pain level controlled Vital Signs Assessment: post-procedure vital signs reviewed and stable Respiratory status: spontaneous breathing, nonlabored ventilation, respiratory function stable and patient connected to nasal cannula oxygen Cardiovascular status: stable and blood pressure returned to baseline Postop Assessment: no apparent nausea or vomiting Anesthetic complications: no   No notable events documented.  Last Vitals:  Vitals:   05/28/22 2115 05/29/22 0630  BP: 107/65 134/85  Pulse: 73 66  Resp: 18 18  Temp: 37.1 C 36.9 C  SpO2: 100% 100%    Last Pain:  Vitals:   05/29/22 0630  TempSrc: Oral  PainSc: 9                  Tyreke Kaeser P Jesper Stirewalt

## 2022-05-29 NOTE — Social Work (Signed)
CSW acknowledged consult and attempted to meet with [name]. However, [name] was resting. CSW will meet with [name] at a later time.  Gee Habig, LCSWA Clinical Social Worker [phone]    

## 2022-05-29 NOTE — Progress Notes (Signed)
POSTPARTUM PROGRESS NOTE  Post Partum Day 1  Subjective:  Monica Cox is a 31 y.o. [id number] s/p PCS at [redacted]w[redacted]d.  No acute events overnight.  Pt  problems with ambulating, voiding or po intake.  She denies nausea or vomiting.  Pain is poorly controlled.  She has not had flatus. She has not had bowel movement.  Lochia Large. She has little appetite but is drinking large volumes of water.   Objective: Blood pressure 134/85, pulse 66, temperature 98.4 F (36.9 C), temperature source Oral, resp. rate 18, height 5' 3.5" (1.613 m), weight 99.3 kg, last menstrual period 09/21/2021, SpO2 100 %, unknown if currently breastfeeding.  Physical Exam:  General: alert, cooperative and no distress Chest: no respiratory distress Heart:regular rate, distal pulses intact Abdomen: soft, nontender,  Uterine Fundus: firm, appropriately tender DVT Evaluation: No calf swelling or tenderness Extremities: 1+ edema Skin: warm, dry; incision clean/dry/intact  Recent Labs    05/28/22 1203 05/29/22 0604  HGB 9.3* 7.7*  HCT 27.8* 22.9*    Assessment/Plan: Monica Cox is a 31 y.o. [id number] s/p PCS at [redacted]w[redacted]d. She will continue to receive PRN oxy for poorly controlled pain. She will receive RhoGam today. Hemoglobin labs will be done today and ordered for tomorrow.  PPD# - Doing well Contraception: OCP Feeding: Bottle Dispo: Plan for discharge following d/c of twins by NICU team..   LOS: 1 day   Janelle Floor I, Medical Student, CNM 05/29/2022, 7:53 AM

## 2022-05-29 NOTE — Social Work (Signed)
CSW received consult for hx of Postpartum Depression.  CSW met with [name] to offer support and complete assessment. CSW entered the room, introduced self, CSW role and reason for visit. [name] was agreeable to visit. CSW inquired about how [name] was feeling [name] stated "like crap" [name] reported she lost 3 liters of blood during the csection and had to have two blood transfusions as well as an iron transfusion. CSW acknowledged her feelings about her recent experience. 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 [name] reported she usually has an immediate onset but this time she has been nothing but "joyful and in love". [name] reported she was on medication previously for her PPD but does not recall the name. Reported she stopped taking the medication because her pharmacy was having trouble filling the prescription.  CSW encouraged [name] to contact her OB or PCP if she has feelings of PPD, [name] agreed. CSW assessed for safety, [name] denied any SI or HI. [name] identified her spouse and her parents as her supports.  CSW provided education regarding the baby blues period vs. perinatal mood disorders, discussed treatment and gave resources for mental health follow up if concerns arise.  CSW recommends self-evaluation during the postpartum time period using the New Mom Checklist from Postpartum Progress and encouraged [name] to contact a medical professional if symptoms are noted at any time.    CSW provided review of Sudden Infant Death Syndrome (SIDS) precautions. [name] identified [facility] for the infants follow up care. [name] reported she has all necessary items fr the babies including bassinets for safe sleep.  CSW identifies no further need for intervention and no barriers to discharge at this time.   Monica Cox, [place] Social Worker [phone]

## 2022-05-30 LAB — CBC
HCT: 21.8 % — ABNORMAL LOW (ref 36.0–46.0)
Hemoglobin: 7.4 g/dL — ABNORMAL LOW (ref 12.0–15.0)
MCH: 27.9 pg (ref 26.0–34.0)
MCHC: 33.9 g/dL (ref 30.0–36.0)
MCV: 82.3 fL (ref 80.0–100.0)
Platelets: 197 10*3/uL (ref 150–400)
RBC: 2.65 MIL/uL — ABNORMAL LOW (ref 3.87–5.11)
RDW: 14.7 % (ref 11.5–15.5)
WBC: 11.3 10*3/uL — ABNORMAL HIGH (ref 4.0–10.5)
nRBC: 0.2 % (ref 0.0–0.2)

## 2022-05-30 LAB — SURGICAL PATHOLOGY

## 2022-05-30 LAB — RH IG WORKUP (INCLUDES ABO/RH)
Fetal Screen: NEGATIVE
Gestational Age(Wks): 36.3
Unit division: 0

## 2022-05-30 MED ORDER — ONDANSETRON HCL 4 MG PO TABS
8.0000 mg | ORAL_TABLET | Freq: Once | ORAL | Status: AC
  Administered 2022-05-30: 8 mg via ORAL
  Filled 2022-05-30: qty 2

## 2022-05-30 MED ORDER — FERROUS SULFATE 325 (65 FE) MG PO TABS
325.0000 mg | ORAL_TABLET | Freq: Every day | ORAL | Status: DC
  Administered 2022-05-30 – 2022-05-31 (×2): 325 mg via ORAL
  Filled 2022-05-30 (×3): qty 1

## 2022-05-30 NOTE — Progress Notes (Signed)
POSTPARTUM PROGRESS NOTE  POD #2  Subjective:  Monica Cox is a 31 y.o. [id number] s/p pLTCS at [redacted]w[redacted]d.  She reports she doing well. No acute events overnight. She reports she is doing well. She denies any problems with ambulating, voiding or po intake. Denies nausea or vomiting. She has  passed flatus. Pain is moderately controlled.  Lochia is minimal.  Objective: Blood pressure 126/65, pulse 73, temperature 98 F (36.7 C), temperature source Oral, resp. rate 16, height 5' 3.5" (1.613 m), weight 99.3 kg, last menstrual period 09/21/2021, SpO2 100 %, unknown if currently breastfeeding.  Physical Exam:  General: alert, cooperative and no distress Chest: no respiratory distress Heart:regular rate, distal pulses intact Abdomen: soft, nontender,  Uterine Fundus: firm, appropriately tender DVT Evaluation: No calf swelling or tenderness Extremities: Mild LE edema Skin: warm, dry; w/ pressure dressing in place over incision  Recent Labs    05/29/22 0604 05/30/22 0457  HGB 7.7* 7.4*  HCT 22.9* 21.8*    Assessment/Plan: Monica Cox is a 31 y.o. [id number] s/p pLCTS at [redacted]w[redacted]d for fetal malpresentation in labor.  POD#2 - Doing welll; pain moderately controlled. H/H appropriate s/p 2uPRBCs & IV iron.   Routine postpartum care  OOB, ambulated  Lovenox for VTE prophylaxis Anemia: asymptomatic  Start po ferrous sulfate Contraception: OCPs Feeding: bottle  Dispo: Plan for discharge 8/26; declined discharge today.   LOS: 2 days   Lavonda Jumbo, DO OB Fellow, [facility] 05/30/2022, 11:08 AM

## 2022-05-31 DIAGNOSIS — D62 Acute posthemorrhagic anemia: Secondary | ICD-10-CM | POA: Diagnosis not present

## 2022-05-31 LAB — BPAM RBC
Blood Product Expiration Date: 202309192359
Blood Product Expiration Date: 202309192359
Blood Product Expiration Date: 202309202359
Blood Product Expiration Date: 202309222359
Blood Product Expiration Date: 202309222359
Blood Product Expiration Date: 202309222359
Blood Product Expiration Date: 202309222359
Blood Product Expiration Date: 202309222359
ISSUE DATE / TIME: 202308230527
ISSUE DATE / TIME: 202308230527
ISSUE DATE / TIME: 202308230527
ISSUE DATE / TIME: 202308230527
Unit Type and Rh: 9500
Unit Type and Rh: 9500
Unit Type and Rh: 9500
Unit Type and Rh: 9500
Unit Type and Rh: 9500
Unit Type and Rh: 9500
Unit Type and Rh: 9500
Unit Type and Rh: 9500

## 2022-05-31 LAB — TYPE AND SCREEN
ABO/RH(D): O NEG
Antibody Screen: POSITIVE
Unit division: 0
Unit division: 0
Unit division: 0
Unit division: 0
Unit division: 0
Unit division: 0
Unit division: 0
Unit division: 0

## 2022-05-31 MED ORDER — IBUPROFEN 600 MG PO TABS: 600.0000 mg | ORAL_TABLET | Freq: Four times a day (QID) | ORAL | 0 refills | Status: DC | PRN

## 2022-05-31 MED ORDER — ACETAMINOPHEN 500 MG PO TABS: 1000.0000 mg | ORAL_TABLET | Freq: Four times a day (QID) | ORAL | 0 refills | Status: AC | PRN

## 2022-05-31 MED ORDER — OXYCODONE HCL 5 MG PO TABS: 5.0000 mg | ORAL_TABLET | Freq: Four times a day (QID) | ORAL | 0 refills | Status: AC | PRN

## 2022-06-02 ENCOUNTER — Other Ambulatory Visit: Payer: Medicaid Other

## 2022-06-02 ENCOUNTER — Encounter: Payer: Medicaid Other | Admitting: Obstetrics & Gynecology

## 2022-06-03 ENCOUNTER — Telehealth: Payer: Self-pay | Admitting: *Deleted

## 2022-06-03 ENCOUNTER — Encounter (HOSPITAL_COMMUNITY): Payer: Self-pay | Admitting: Obstetrics & Gynecology

## 2022-06-03 ENCOUNTER — Other Ambulatory Visit: Payer: Self-pay

## 2022-06-03 ENCOUNTER — Inpatient Hospital Stay (HOSPITAL_COMMUNITY)
Admission: AD | Admit: 2022-06-03 | Discharge: 2022-06-03 | Disposition: A | Discharge status: Home / Self Care | Payer: Medicaid Other | Attending: Obstetrics & Gynecology | Admitting: Obstetrics & Gynecology

## 2022-06-03 DIAGNOSIS — O1205 Gestational edema, complicating the puerperium: Secondary | ICD-10-CM | POA: Diagnosis not present

## 2022-06-03 DIAGNOSIS — O165 Unspecified maternal hypertension, complicating the puerperium: Secondary | ICD-10-CM | POA: Insufficient documentation

## 2022-06-03 LAB — COMPREHENSIVE METABOLIC PANEL
ALT: 21 U/L (ref 0–44)
AST: 34 U/L (ref 15–41)
Albumin: 2.3 g/dL — ABNORMAL LOW (ref 3.5–5.0)
Alkaline Phosphatase: 111 U/L (ref 38–126)
Anion gap: 7 (ref 5–15)
BUN: 9 mg/dL (ref 6–20)
CO2: 24 mmol/L (ref 22–32)
Calcium: 8.4 mg/dL — ABNORMAL LOW (ref 8.9–10.3)
Chloride: 110 mmol/L (ref 98–111)
Creatinine, Ser: 0.75 mg/dL (ref 0.44–1.00)
GFR, Estimated: >60 mL/min (ref >60)
Glucose, Bld: 103 mg/dL — ABNORMAL HIGH (ref 70–99)
Potassium: 3.6 mmol/L (ref 3.5–5.1)
Sodium: 141 mmol/L (ref 135–145)
Total Bilirubin: 0.7 mg/dL (ref 0.3–1.2)
Total Protein: 5.4 g/dL — ABNORMAL LOW (ref 6.5–8.1)

## 2022-06-03 LAB — CBC
HCT: 25.5 % — ABNORMAL LOW (ref 36.0–46.0)
Hemoglobin: 8.3 g/dL — ABNORMAL LOW (ref 12.0–15.0)
MCH: 27.9 pg (ref 26.0–34.0)
MCHC: 32.5 g/dL (ref 30.0–36.0)
MCV: 85.6 fL (ref 80.0–100.0)
Platelets: 298 10*3/uL (ref 150–400)
RBC: 2.98 MIL/uL — ABNORMAL LOW (ref 3.87–5.11)
RDW: 16 % — ABNORMAL HIGH (ref 11.5–15.5)
WBC: 7.2 10*3/uL (ref 4.0–10.5)
nRBC: 0.3 % — ABNORMAL HIGH (ref 0.0–0.2)

## 2022-06-03 MED ORDER — FUROSEMIDE 20 MG PO TABS
20.0000 mg | ORAL_TABLET | Freq: Every day | ORAL | 0 refills | Status: DC
Start: 2022-06-03 — End: 2022-06-12

## 2022-06-03 NOTE — Telephone Encounter (Signed)
Pt reports that she has not been feeling well. Her BP is 160/98 and 182/110. She reports feeling dizzy and is swollen. Advised that she should go to MAU for evaluation. Pt will have her husband drive her.

## 2022-06-03 NOTE — MAU Note (Signed)
Monica Cox is a 31 y.o. at Unknown here in MAU reporting: she had an elevated BP's taken @ home, BP 166/99 & 182/110.  Denies H/A, visual disturbances, and epigastric pain.  Reports increased swelling in hands and feet.  States bilateral feet and legs feel "tight". S/P Cesarean 05/28/2022 for twins LMP: N/A Onset of complaint: today Pain score: 0 There were no vitals filed for this visit.   FHT:N/A Lab orders placed from triage:   None

## 2022-06-03 NOTE — MAU Provider Note (Signed)
History     CSN: [id number]  Arrival date and time: 06/03/22 1619   Event Date/Time   First Provider Initiated Contact with Patient 06/03/22 1700      Chief Complaint  Patient presents with   Swelling   BP Evaluation   [name] is a 31 y.o. year old [phone] female at 6 days PP s/p C/S for Di-Di twins at 36+ weeks gestation who presents to MAU reporting BP of 166/94 and 182/110 on at home cuff. She denies H/A, dizziness, blurry vision or epigastric pain. She reports she "felt this same way after each of her other pregnancy" and she was dx'd with PEC each time. She expresses concern that she is developing PEC this time also. She also complains of feeling "floaty" in her chest "like being under water." She denies pain in her chest or heart palpitations. She receives [facility]. Her spouse is present and contributing to the history taking.    OB History     Gravida  6   Para  5   Term  4   Preterm  1   AB  1   Living  6      SAB  1   IAB  0   Ectopic  0   Multiple  1   Live Births  6           Past Medical History:  Diagnosis Date   Anemia    Asthma    inhaler as needed   Depression    GERD (gastroesophageal reflux disease)    H/O trichomonas 08/08/2014   2015 pregnancy    History of chlamydia 03/19/2016   2015 pregnancy    History of kidney stones    Mild to moderate pre-eclampsia, postpartum    Nausea 02/06/2016   Postpartum hypertension    pp htn   Reflux    Smoker 02/06/2016   Trichomonas infection 08/08/2014   Vaginal Pap smear, abnormal    Vertigo 09/21/2014   antivert rx 09/21/2014      Past Surgical History:  Procedure Laterality Date   CESAREAN SECTION MULTI-GESTATIONAL N/A 05/28/2022   Procedure: CESAREAN SECTION MULTI-GESTATIONAL;  Surgeon: Catalina Antigua, MD;  Location: [facility] LD [facility]S;  Service: Obstetrics;  Laterality: N/A;   DILATION AND CURETTAGE OF UTERUS     MAB Dr. Despina Hidden performed    ESOPHAGOGASTRODUODENOSCOPY (EGD) WITH PROPOFOL N/A 09/23/2018   Procedure: ESOPHAGOGASTRODUODENOSCOPY (EGD) WITH PROPOFOL;  Surgeon: Corbin Ade, MD;  Location: [facility];  Service: Endoscopy;  Laterality: N/A;  8:45am   ESOPHAGOGASTRODUODENOSCOPY ENDOSCOPY      Family History  Problem Relation Age of Onset   Hypertension Mother    Varicose Veins Father    Ulcers Father    Hypertension Father    Ulcers Paternal Aunt    Ulcers Paternal Grandmother    Diabetes Paternal Grandmother    Liver disease Neg Hx    Colon cancer Neg Hx    Gastric cancer Neg Hx    Esophageal cancer Neg Hx     Social History   Tobacco Use   Smoking status: Former    Types: Cigars    Quit date: 09/20/2016    Years since quitting: 5.7   Smokeless tobacco: Never  Vaping Use   Vaping Use: Never used  Substance Use Topics   Alcohol use: Not Currently    Comment: occasionally   Drug use: No    Allergies:  Allergies  Allergen Reactions  Sulfa Antibiotics Nausea And Vomiting   Tape Other (See Comments)    Tears skin    Medications Prior to Admission  Medication Sig Dispense Refill Last Dose   ibuprofen (ADVIL) 600 MG tablet Take 1 tablet (600 mg total) by mouth every 6 (six) hours as needed for moderate pain. 30 tablet 0 06/02/2022   oxyCODONE (OXY IR/ROXICODONE) 5 MG immediate release tablet Take 1 tablet (5 mg total) by mouth every 6 (six) hours as needed for up to 5 days for breakthrough pain. 18 tablet 0 06/02/2022   acetaminophen (TYLENOL) 500 MG tablet Take 2 tablets (1,000 mg total) by mouth every 6 (six) hours as needed for mild pain. 30 tablet 0    Blood Pressure Monitor MISC For regular home bp monitoring during pregnancy 1 each 0    escitalopram (LEXAPRO) 10 MG tablet TAKE 1 TABLET BY MOUTH EVERY DAY 90 tablet 3    ferrous gluconate (FERGON) 324 MG tablet Take 1 tablet (324 mg total) by mouth every other day. 15 tablet 6    pantoprazole (PROTONIX) 20 MG tablet Take 1 tablet (20 mg  total) by mouth daily. 30 tablet 2    Prenatal MV & Min w/FA-DHA (PRENATAL GUMMIES PO) Take by mouth.       Review of Systems  Constitutional: Negative.   HENT: Negative.    Eyes: Negative.   Respiratory: Negative.    Cardiovascular:  Positive for leg swelling.       BPs were 166/94 & 182/110 on my home cuff. "My chest feels floaty like I'm in water"  Gastrointestinal: Negative.   Endocrine: Negative.   Genitourinary:  Positive for dyspareunia and vaginal bleeding (normal lochia).  Musculoskeletal: Negative.   Skin: Negative.   Allergic/Immunologic: Negative.   Neurological: Negative.   Hematological: Negative.   Psychiatric/Behavioral: Negative.     Physical Exam   Patient Vitals for the past 24 hrs:  BP Temp Temp src Pulse Resp SpO2 Height Weight  06/03/22 1846 139/75 -- -- 63 -- -- -- --  06/03/22 1831 139/71 -- -- 66 -- -- -- --  06/03/22 1816 (!) 141/75 -- -- 72 -- -- -- --  06/03/22 1801 134/71 -- -- 70 -- -- -- --  06/03/22 1746 135/68 -- -- 75 -- -- -- --  06/03/22 1731 125/67 -- -- 92 -- -- -- --  06/03/22 1725 133/67 -- -- 97 -- -- -- --  06/03/22 1642 134/78 98 F (36.7 C) Oral 84 19 99 % -- --  06/03/22 1636 -- -- -- -- -- -- 5' 3.5" (1.613 m) 97.8 kg   Physical Exam Vitals and nursing note reviewed.  Constitutional:      Appearance: Normal appearance. She is obese.  Cardiovascular:     Rate and Rhythm: Normal rate.     Heart sounds: Normal heart sounds.  Pulmonary:     Effort: Pulmonary effort is normal.     Breath sounds: Normal breath sounds.  Abdominal:     General: Bowel sounds are normal.     Palpations: Abdomen is soft.  Genitourinary:    Comments: Not evaluated Musculoskeletal:     Right lower leg: Edema (2+ pitting edema) present.     Left lower leg: Edema (2+ pitting edema) present.  Skin:    General: Skin is warm and dry.  Neurological:     Mental Status: She is alert and oriented to person, place, and time.  Psychiatric:        Mood  and Affect: Mood normal.        Behavior: Behavior normal.        Thought Content: Thought content normal.        Judgment: Judgment normal.     MAU Course  Procedures  MDM CCUA CBC CMP Serial BP's   *Consult with Dr. Jolayne Panther @ [phone] - notified of patient's complaints, assessments, lab results, tx plan Lasix 20 mg po qd x 5 days and BP recheck in office later this week - ok to d/c home, agrees with plan   Results for orders placed or performed during the hospital encounter of 06/03/22 (from the past 24 hour(s))  CBC     Status: Abnormal   Collection Time: 06/03/22  5:07 PM  Result Value Ref Range   WBC 7.2 4.0 - 10.5 K/uL   RBC 2.98 (L) 3.87 - 5.11 MIL/uL   Hemoglobin 8.3 (L) 12.0 - 15.0 g/dL   HCT 42.6 (L) 83.4 - 19.6 %   MCV 85.6 80.0 - 100.0 fL   MCH 27.9 26.0 - 34.0 pg   MCHC 32.5 30.0 - 36.0 g/dL   RDW 22.2 (H) 97.9 - 89.2 %   Platelets 298 150 - 400 K/uL   nRBC 0.3 (H) 0.0 - 0.2 %  Comprehensive metabolic panel     Status: Abnormal   Collection Time: 06/03/22  5:07 PM  Result Value Ref Range   Sodium 141 135 - 145 mmol/L   Potassium 3.6 3.5 - 5.1 mmol/L   Chloride 110 98 - 111 mmol/L   CO2 24 22 - 32 mmol/L   Glucose, Bld 103 (H) 70 - 99 mg/dL   BUN 9 6 - 20 mg/dL   Creatinine, Ser 1.19 0.44 - 1.00 mg/dL   Calcium 8.4 (L) 8.9 - 10.3 mg/dL   Total Protein 5.4 (L) 6.5 - 8.1 g/dL   Albumin 2.3 (L) 3.5 - 5.0 g/dL   AST 34 15 - 41 U/L   ALT 21 0 - 44 U/L   Alkaline Phosphatase 111 38 - 126 U/L   Total Bilirubin 0.7 0.3 - 1.2 mg/dL   GFR, Estimated >41 >74 mL/min   Anion gap 7 5 - 15     Assessment and Plan  1. Postpartum edema - Rx for Lasix 20 mg daily po x 5 days - Information provided on edema   2. Postpartum hypertension - Advised to only take BP at home if symptomatic (symptoms reviewed) d/t BP cuff possibly not the correct size for her arm - Information provided on postpartum hypertension   - Discharge patient - Make an appointment for BP recheck  on Thursday or Friday of this week - Patient verbalized an understanding of the plan of care and agrees.     Raelyn Mora, CNM 06/03/2022, 5:00 PM

## 2022-06-05 ENCOUNTER — Telehealth (HOSPITAL_COMMUNITY): Payer: Self-pay | Admitting: *Deleted

## 2022-06-05 ENCOUNTER — Other Ambulatory Visit: Payer: Medicaid Other

## 2022-06-05 NOTE — Telephone Encounter (Signed)
Attempted Hospital Discharge Follow-Up Call.  Left voice mail requesting that patient return RN's phone call if patient has any concerns or questions regarding herself or her babies.

## 2022-06-06 ENCOUNTER — Encounter (HOSPITAL_COMMUNITY)
Admission: RE | Admit: 2022-06-06 | Discharge: 2022-06-06 | Disposition: A | Discharge status: Home / Self Care | Payer: Medicaid Other | Source: Ambulatory Visit | Attending: Obstetrics & Gynecology | Admitting: Obstetrics & Gynecology

## 2022-06-06 DIAGNOSIS — Z419 Encounter for procedure for purposes other than remedying health state, unspecified: Secondary | ICD-10-CM | POA: Diagnosis not present

## 2022-06-09 ENCOUNTER — Inpatient Hospital Stay (HOSPITAL_COMMUNITY)
Admission: RE | Admit: 2022-06-09 | Payer: Medicaid Other | Source: Home / Self Care | Admitting: Obstetrics & Gynecology

## 2022-06-12 ENCOUNTER — Inpatient Hospital Stay (HOSPITAL_COMMUNITY)
Admission: AD | Admit: 2022-06-12 | Discharge: 2022-06-14 | DRG: 776 | Disposition: A | Discharge status: Home / Self Care | Payer: Medicaid Other | Attending: Family Medicine | Admitting: Family Medicine

## 2022-06-12 ENCOUNTER — Encounter: Payer: Self-pay | Admitting: Advanced Practice Midwife

## 2022-06-12 ENCOUNTER — Other Ambulatory Visit: Payer: Self-pay

## 2022-06-12 ENCOUNTER — Ambulatory Visit (INDEPENDENT_AMBULATORY_CARE_PROVIDER_SITE_OTHER): Payer: Medicaid Other | Admitting: Advanced Practice Midwife

## 2022-06-12 ENCOUNTER — Encounter (HOSPITAL_COMMUNITY): Payer: Self-pay | Admitting: Obstetrics and Gynecology

## 2022-06-12 VITALS — BP 182/118 | HR 82 | Wt 190.0 lb

## 2022-06-12 DIAGNOSIS — O165 Unspecified maternal hypertension, complicating the puerperium: Secondary | ICD-10-CM

## 2022-06-12 DIAGNOSIS — R030 Elevated blood-pressure reading, without diagnosis of hypertension: Secondary | ICD-10-CM | POA: Diagnosis not present

## 2022-06-12 DIAGNOSIS — Z87891 Personal history of nicotine dependence: Secondary | ICD-10-CM | POA: Diagnosis not present

## 2022-06-12 DIAGNOSIS — Z09 Encounter for follow-up examination after completed treatment for conditions other than malignant neoplasm: Secondary | ICD-10-CM | POA: Diagnosis not present

## 2022-06-12 DIAGNOSIS — K668 Other specified disorders of peritoneum: Secondary | ICD-10-CM

## 2022-06-12 DIAGNOSIS — O1415 Severe pre-eclampsia, complicating the puerperium: Secondary | ICD-10-CM | POA: Diagnosis not present

## 2022-06-12 LAB — CBC
HCT: 32.7 % — ABNORMAL LOW (ref 36.0–46.0)
Hemoglobin: 10.4 g/dL — ABNORMAL LOW (ref 12.0–15.0)
MCH: 27.4 pg (ref 26.0–34.0)
MCHC: 31.8 g/dL (ref 30.0–36.0)
MCV: 86.3 fL (ref 80.0–100.0)
Platelets: 408 10*3/uL — ABNORMAL HIGH (ref 150–400)
RBC: 3.79 MIL/uL — ABNORMAL LOW (ref 3.87–5.11)
RDW: 16.4 % — ABNORMAL HIGH (ref 11.5–15.5)
WBC: 5 10*3/uL (ref 4.0–10.5)
nRBC: 0 % (ref 0.0–0.2)

## 2022-06-12 LAB — COMPREHENSIVE METABOLIC PANEL
ALT: 21 U/L (ref 0–44)
AST: 24 U/L (ref 15–41)
Albumin: 2.9 g/dL — ABNORMAL LOW (ref 3.5–5.0)
Alkaline Phosphatase: 100 U/L (ref 38–126)
Anion gap: 8 (ref 5–15)
BUN: 7 mg/dL (ref 6–20)
CO2: 23 mmol/L (ref 22–32)
Calcium: 8.7 mg/dL — ABNORMAL LOW (ref 8.9–10.3)
Chloride: 109 mmol/L (ref 98–111)
Creatinine, Ser: 0.8 mg/dL (ref 0.44–1.00)
GFR, Estimated: >60 mL/min (ref >60)
Glucose, Bld: 96 mg/dL (ref 70–99)
Potassium: 3.4 mmol/L — ABNORMAL LOW (ref 3.5–5.1)
Sodium: 140 mmol/L (ref 135–145)
Total Bilirubin: 0.6 mg/dL (ref 0.3–1.2)
Total Protein: 6.2 g/dL — ABNORMAL LOW (ref 6.5–8.1)

## 2022-06-12 LAB — TYPE AND SCREEN
ABO/RH(D): O NEG
Antibody Screen: POSITIVE

## 2022-06-12 MED ORDER — PRENATAL MULTIVITAMIN CH
1.0000 | ORAL_TABLET | Freq: Every day | ORAL | Status: DC
  Administered 2022-06-14: 1 via ORAL
  Filled 2022-06-12 (×2): qty 1

## 2022-06-12 MED ORDER — LABETALOL HCL 5 MG/ML IV SOLN: 40.0000 mg | INTRAVENOUS | Status: DC | PRN

## 2022-06-12 MED ORDER — ZOLPIDEM TARTRATE 5 MG PO TABS: 5.0000 mg | ORAL_TABLET | Freq: Every evening | ORAL | Status: DC | PRN

## 2022-06-12 MED ORDER — NIFEDIPINE ER OSMOTIC RELEASE 30 MG PO TB24: 30.0000 mg | ORAL_TABLET | Freq: Every day | ORAL | 2 refills | Status: DC

## 2022-06-12 MED ORDER — LABETALOL HCL 5 MG/ML IV SOLN: 80.0000 mg | INTRAVENOUS | Status: DC | PRN

## 2022-06-12 MED ORDER — BUTALBITAL-APAP-CAFFEINE 50-325-40 MG PO TABS
1.0000 | ORAL_TABLET | Freq: Four times a day (QID) | ORAL | Status: DC | PRN
  Administered 2022-06-13: 1 via ORAL
  Filled 2022-06-12: qty 2

## 2022-06-12 MED ORDER — LACTATED RINGERS IV SOLN: INTRAVENOUS | Status: DC

## 2022-06-12 MED ORDER — HYDRALAZINE HCL 20 MG/ML IJ SOLN: 10.0000 mg | INTRAMUSCULAR | Status: DC | PRN

## 2022-06-12 MED ORDER — CYCLOBENZAPRINE HCL 10 MG PO TABS
10.0000 mg | ORAL_TABLET | Freq: Once | ORAL | Status: AC
  Administered 2022-06-12: 10 mg via ORAL
  Filled 2022-06-12: qty 1

## 2022-06-12 MED ORDER — LABETALOL HCL 5 MG/ML IV SOLN
20.0000 mg | INTRAVENOUS | Status: DC | PRN
  Administered 2022-06-12: 20 mg via INTRAVENOUS
  Filled 2022-06-12: qty 4

## 2022-06-12 MED ORDER — ACETAMINOPHEN 325 MG PO TABS
650.0000 mg | ORAL_TABLET | ORAL | Status: DC | PRN
  Administered 2022-06-12 – 2022-06-13 (×2): 650 mg via ORAL
  Filled 2022-06-12 (×2): qty 2

## 2022-06-12 MED ORDER — CALCIUM CARBONATE ANTACID 500 MG PO CHEW: 2.0000 | CHEWABLE_TABLET | ORAL | Status: DC | PRN

## 2022-06-12 MED ORDER — LABETALOL HCL 5 MG/ML IV SOLN: 20.0000 mg | INTRAVENOUS | Status: DC | PRN

## 2022-06-12 MED ORDER — MAGNESIUM SULFATE BOLUS VIA INFUSION
4.0000 g | Freq: Once | INTRAVENOUS | Status: AC
  Administered 2022-06-12: 4 g via INTRAVENOUS
  Filled 2022-06-12: qty 1000

## 2022-06-12 MED ORDER — ENOXAPARIN SODIUM 40 MG/0.4ML IJ SOSY
40.0000 mg | PREFILLED_SYRINGE | INTRAMUSCULAR | Status: DC
  Administered 2022-06-12 – 2022-06-13 (×2): 40 mg via SUBCUTANEOUS
  Filled 2022-06-12 (×2): qty 0.4

## 2022-06-12 MED ORDER — LABETALOL HCL 5 MG/ML IV SOLN
40.0000 mg | INTRAVENOUS | Status: DC | PRN
  Administered 2022-06-12: 40 mg via INTRAVENOUS
  Filled 2022-06-12: qty 8

## 2022-06-12 MED ORDER — LABETALOL HCL 5 MG/ML IV SOLN
80.0000 mg | INTRAVENOUS | Status: DC | PRN
  Administered 2022-06-12: 80 mg via INTRAVENOUS
  Filled 2022-06-12: qty 16

## 2022-06-12 MED ORDER — MAGNESIUM SULFATE 40 GM/1000ML IV SOLN
2.0000 g/h | INTRAVENOUS | Status: AC
  Administered 2022-06-12 – 2022-06-13 (×2): 2 g/h via INTRAVENOUS
  Filled 2022-06-12 (×2): qty 1000

## 2022-06-12 MED ORDER — HYDRALAZINE HCL 20 MG/ML IJ SOLN
10.0000 mg | INTRAMUSCULAR | Status: DC | PRN
  Administered 2022-06-12 (×2): 10 mg via INTRAVENOUS
  Filled 2022-06-12 (×3): qty 1

## 2022-06-12 MED ORDER — DOCUSATE SODIUM 100 MG PO CAPS
100.0000 mg | ORAL_CAPSULE | Freq: Every day | ORAL | Status: DC
  Administered 2022-06-13: 100 mg via ORAL
  Filled 2022-06-12 (×2): qty 1

## 2022-06-12 NOTE — MAU Note (Signed)
Monica Cox is a 31 y.o. here in MAU reporting: went to the office today for incision check and had elevated BP. Was sent home with procardia. Pt states she has a hx of PP pre-e and did not feel comfortable with just taking BP meds. Has a headache and nausea.   Onset of complaint: ongoing  Pain score: 9/10  Vitals:   06/12/22 1711  BP: (!) 181/125  Pulse: 88  Resp: (!) 21  Temp: 98 F (36.7 C)  SpO2: 95%     Lab orders placed from triage:

## 2022-06-12 NOTE — Progress Notes (Signed)
  HPI: Patient returns for routine postoperative follow-up having undergone PLTCS for Di/Di twins w/malpresetation on 05/28/20.  The patient's immediate postoperative recovery has been complicated by elevated BPs at home; went to MAU on 8/29, BPs weren't severe, given lasix for 5 days.   Since hospital discharge the patient reports feeling like she did when she had PP preeclampsia .   Current Outpatient Medications: acetaminophen (TYLENOL) 500 MG tablet, Take 2 tablets (1,000 mg total) by mouth every 6 (six) hours as needed for mild pain., Disp: 30 tablet, Rfl: 0 ibuprofen (ADVIL) 600 MG tablet, Take 1 tablet (600 mg total) by mouth every 6 (six) hours as needed for moderate pain., Disp: 30 tablet, Rfl: 0 Blood Pressure Monitor MISC, For regular home bp monitoring during pregnancy (Patient not taking: Reported on 06/12/2022), Disp: 1 each, Rfl: 0 escitalopram (LEXAPRO) 10 MG tablet, TAKE 1 TABLET BY MOUTH EVERY DAY (Patient not taking: Reported on 06/12/2022), Disp: 90 tablet, Rfl: 3 ferrous gluconate (FERGON) 324 MG tablet, Take 1 tablet (324 mg total) by mouth every other day., Disp: 15 tablet, Rfl: 6 pantoprazole (PROTONIX) 20 MG tablet, Take 1 tablet (20 mg total) by mouth daily. (Patient not taking: Reported on 06/12/2022), Disp: 30 tablet, Rfl: 2 Prenatal MV & Min w/FA-DHA (PRENATAL GUMMIES PO), Take by mouth. (Patient not taking: Reported on 06/12/2022), Disp: , Rfl:   No current facility-administered medications for this visit.    Blood pressure (!) 182/118, pulse 82, weight 190 lb (86.2 kg), not currently breastfeeding.  156/116  Physical Exam: Incision C/D w/o erythema, drainage or odor. Has an area of granulation tissue, treated with silver nitrate  Discussed BP w/JOz, recommended  procardia 30XL BID  Impression:  1 weeks s/p CS, granulation tissue Postpartum hypertension Plan: No orders of the defined types were placed in this encounter.    Follow up: No follow-ups on file.1  week for BP check w/Rn [facility])   Jacklyn Shell, CNM

## 2022-06-12 NOTE — MAU Provider Note (Signed)
History     [id number]  Arrival date and time: 06/12/22 1643    Chief Complaint  Patient presents with   Hypertension   Headache     HPI Monica Cox is a 31 y.o. s/p primary LTCS for di/di twin gestation on 05/28/2022, hx of postpartum pre-eclampsia x3 in prior pregnancies, who presents for elevated blood pressures.   Patient delivered di/di twins on 05/28/2022 by LTCS BP's were normal during admission She came to MAU on 06/03/2022 reporting severe range BP's at home She had one mild range BP while in MAU and noted to be edematous on exam, she was given PO lasix x5 days and discharged with plan for outpatient BP check in one week Today she went to [facility] and had severe range BP's, she subsequently brought herself to MAU over concerns about her blood pressure and her history of postpartum severe PreE in prior pregnancies  On my exam reports she has had a non-stop headache for the past two days She denies vision changes, chest pain, shortness of breath, or RUQ pain She does endorse significant ongoing lower extremity edema   --/--/O NEG (08/23 0300)  OB History     Gravida  6   Para  5   Term  4   Preterm  1   AB  1   Living  6      SAB  1   IAB  0   Ectopic  0   Multiple  1   Live Births  6           Past Medical History:  Diagnosis Date   Anemia    Asthma    inhaler as needed   Depression    GERD (gastroesophageal reflux disease)    H/O trichomonas 08/08/2014   2015 pregnancy    History of chlamydia 03/19/2016   2015 pregnancy    History of kidney stones    Mild to moderate pre-eclampsia, postpartum    Nausea 02/06/2016   Postpartum hypertension    pp htn   Reflux    Smoker 02/06/2016   Trichomonas infection 08/08/2014   Vaginal Pap smear, abnormal    Vertigo 09/21/2014   antivert rx 09/21/2014      Past Surgical History:  Procedure Laterality Date   CESAREAN SECTION MULTI-GESTATIONAL N/A 05/28/2022   Procedure:  CESAREAN SECTION MULTI-GESTATIONAL;  Surgeon: Catalina Antigua, MD;  Location: [facility] LD ORS;  Service: Obstetrics;  Laterality: N/A;   DILATION AND CURETTAGE OF UTERUS     [facility] Dr. Despina Hidden performed   ESOPHAGOGASTRODUODENOSCOPY (EGD) WITH PROPOFOL N/A 09/23/2018   Procedure: ESOPHAGOGASTRODUODENOSCOPY (EGD) WITH PROPOFOL;  Surgeon: Corbin Ade, MD;  Location: [facility] END[facility];  Service: Endoscopy;  Laterality: N/A;  8:45am   ESOPHAGOGASTRODUODENOSCOPY ENDOSCOPY      Family History  Problem Relation Age of Onset   Hypertension Mother    Varicose Veins Father    Ulcers Father    Hypertension Father    Ulcers Paternal Aunt    Ulcers Paternal Grandmother    Diabetes Paternal Grandmother    Liver disease Neg Hx    Colon cancer Neg Hx    Gastric cancer Neg Hx    Esophageal cancer Neg Hx     Social History   Socioeconomic History   Marital status: Married    Spouse name: Not on file   Number of children: 4   Years of education: Not on file   Highest education level: Not on file  Occupational History   Occupation: [employer]    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  Tobacco Use   Smoking status: Former    Types: Cigars    Quit date: 09/20/2016    Years since quitting: 5.7   Smokeless tobacco: Never  Vaping Use   Vaping Use: Never used  Substance and Sexual Activity   Alcohol use: Not Currently    Comment: occasionally   Drug use: No   Sexual activity: Not Currently    Birth control/protection: None  Other Topics Concern   Not on file  Social History Narrative   ** Merged History Encounter **       Social Determinants of Health   Financial Resource Strain: Low Risk  (12/09/2021)   Overall Financial Resource Strain (CARDIA)    Difficulty of Paying Living Expenses: Not hard at all  Food Insecurity: No Food Insecurity (12/09/2021)   Hunger Vital Sign    Worried About Running Out of Food in the Last Year: Never true    Ran Out of Food in the Last Year: Never true   Transportation Needs: No Transportation Needs (12/09/2021)   PRAPARE - Administrator, Civil Service (Medical): No    Lack of Transportation (Non-Medical): No  Physical Activity: Insufficiently Active (12/09/2021)   Exercise Vital Sign    Days of Exercise per Week: 1 day    Minutes of Exercise per Session: 40 min  Stress: Stress Concern Present (12/09/2021)   [employer] of Occupational Health - Occupational Stress Questionnaire    Feeling of Stress : To some extent  Social Connections: Socially Isolated (12/09/2021)   Social Connection and Isolation Panel [NHANES]    Frequency of Communication with Friends and Family: Once a week    Frequency of Social Gatherings with Friends and Family: Once a week    Attends Religious Services: Never    Database administrator or Organizations: No    Attends Banker Meetings: Never    Marital Status: Married  Catering manager Violence: Not At Risk (12/09/2021)   Humiliation, Afraid, Rape, and Kick questionnaire    Fear of Current or Ex-Partner: No    Emotionally Abused: No    Physically Abused: No    Sexually Abused: No    Allergies  Allergen Reactions   Sulfa Antibiotics Nausea And Vomiting   Tape Other (See Comments)    Tears skin    No current facility-administered medications on file prior to encounter.   Current Outpatient Medications on File Prior to Encounter  Medication Sig Dispense Refill   acetaminophen (TYLENOL) 500 MG tablet Take 2 tablets (1,000 mg total) by mouth every 6 (six) hours as needed for mild pain. 30 tablet 0   Blood Pressure Monitor MISC For regular home bp monitoring during pregnancy (Patient not taking: Reported on 06/12/2022) 1 each 0   escitalopram (LEXAPRO) 10 MG tablet TAKE 1 TABLET BY MOUTH EVERY DAY (Patient not taking: Reported on 06/12/2022) 90 tablet 3   ferrous gluconate (FERGON) 324 MG tablet Take 1 tablet (324 mg total) by mouth every other day. 15 tablet 6   ibuprofen (ADVIL) 600 MG  tablet Take 1 tablet (600 mg total) by mouth every 6 (six) hours as needed for moderate pain. 30 tablet 0   NIFEdipine (PROCARDIA XL) 30 MG 24 hr tablet Take 1 tablet (30 mg total) by mouth daily. 60 tablet 2   pantoprazole (PROTONIX) 20 MG tablet Take 1 tablet (20 mg  total) by mouth daily. (Patient not taking: Reported on 06/12/2022) 30 tablet 2   Prenatal MV & Min w/FA-DHA (PRENATAL GUMMIES PO) Take by mouth. (Patient not taking: Reported on 06/12/2022)       ROS Pertinent positives and negative per HPI, all others reviewed and negative  Physical Exam   BP (!) 183/116   Pulse 91   Temp 98 F (36.7 C) (Oral)   Resp (!) 21   SpO2 95%   Breastfeeding No   Patient Vitals for the past 24 hrs:  BP Temp Temp src Pulse Resp SpO2  06/12/22 1731 (!) 183/116 -- -- 91 -- --  06/12/22 1726 (!) 182/116 -- -- 80 -- --  06/12/22 1711 (!) 181/125 98 F (36.7 C) Oral 88 (!) 21 95 %    Physical Exam Vitals reviewed.  Constitutional:      General: She is not in acute distress.    Appearance: She is well-developed. She is not diaphoretic.  Eyes:     General: No scleral icterus. Pulmonary:     Effort: Pulmonary effort is normal. No respiratory distress.  Abdominal:     General: There is no distension.     Palpations: Abdomen is soft.     Tenderness: There is no abdominal tenderness. There is no guarding or rebound.  Skin:    General: Skin is warm and dry.  Neurological:     Mental Status: She is alert.     Coordination: Coordination normal.      Cervical Exam    Bedside Ultrasound Not done  My interpretation: n/a  FHT N/a  Labs Results for orders placed or performed during the hospital encounter of 06/12/22 (from the past 24 hour(s))  CBC     Status: Abnormal   Collection Time: 06/12/22  5:14 PM  Result Value Ref Range   WBC 5.0 4.0 - 10.5 K/uL   RBC 3.79 (L) 3.87 - 5.11 MIL/uL   Hemoglobin 10.4 (L) 12.0 - 15.0 g/dL   HCT 78.2 (L) 42.3 - 53.6 %   MCV 86.3 80.0 - 100.0 fL    MCH 27.4 26.0 - 34.0 pg   MCHC 31.8 30.0 - 36.0 g/dL   RDW 14.4 (H) 31.5 - 40.0 %   Platelets 408 (H) 150 - 400 K/uL   nRBC 0.0 0.0 - 0.2 %    Imaging No results found.  MAU Course  Procedures Lab Orders         Comprehensive metabolic panel         CBC         Creatinine, serum     Meds ordered this encounter  Medications   DISCONTD: labetalol (NORMODYNE) injection 20 mg   DISCONTD: labetalol (NORMODYNE) injection 40 mg   DISCONTD: labetalol (NORMODYNE) injection 80 mg   DISCONTD: hydrALAZINE (APRESOLINE) injection 10 mg   acetaminophen (TYLENOL) tablet 650 mg   zolpidem (AMBIEN) tablet 5 mg   docusate sodium (COLACE) capsule 100 mg   calcium carbonate (TUMS - dosed in mg elemental calcium) chewable tablet 400 mg of elemental calcium   prenatal multivitamin tablet 1 tablet   AND Linked Order Group    labetalol (NORMODYNE) injection 20 mg    labetalol (NORMODYNE) injection 40 mg    labetalol (NORMODYNE) injection 80 mg    hydrALAZINE (APRESOLINE) injection 10 mg   magnesium bolus via infusion 4 g   magnesium sulfate 40 grams in SWI 1000 mL OB infusion   lactated ringers infusion   enoxaparin (  LOVENOX) injection 40 mg   Imaging Orders  No imaging studies ordered today    MDM High  Assessment and Plan  #Severe pre-eclampsia, post-partum Patient presenting with two days of headache and persistent severe range blood pressures. Labetalol protocol initiated and magnesium gtt ordered. Discussed with Dr. Despina Hidden who agrees with admission to antepartum unit for treatment of severe PP Pre-E.   Dispo: admit to antepartum.     Venora Maples, MD/MPH 06/12/22 5:52 PM

## 2022-06-13 MED ORDER — OXYCODONE-ACETAMINOPHEN 5-325 MG PO TABS
1.0000 | ORAL_TABLET | Freq: Once | ORAL | Status: AC
  Administered 2022-06-13: 1 via ORAL
  Filled 2022-06-13: qty 1

## 2022-06-13 MED ORDER — LACTATED RINGERS IV SOLN: INTRAVENOUS | Status: DC

## 2022-06-13 MED ORDER — SODIUM CHLORIDE 0.9% FLUSH
3.0000 mL | Freq: Two times a day (BID) | INTRAVENOUS | Status: DC
  Administered 2022-06-13: 3 mL via INTRAVENOUS

## 2022-06-13 MED ORDER — NIFEDIPINE ER OSMOTIC RELEASE 60 MG PO TB24
60.0000 mg | ORAL_TABLET | Freq: Every day | ORAL | Status: DC
  Administered 2022-06-13 – 2022-06-14 (×2): 60 mg via ORAL
  Filled 2022-06-13 (×2): qty 1

## 2022-06-13 MED ORDER — IBUPROFEN 600 MG PO TABS
600.0000 mg | ORAL_TABLET | Freq: Four times a day (QID) | ORAL | Status: DC | PRN
Start: 2022-06-13 — End: 2022-06-14
  Administered 2022-06-13: 600 mg via ORAL
  Filled 2022-06-13: qty 1

## 2022-06-13 MED ORDER — NIFEDIPINE ER OSMOTIC RELEASE 30 MG PO TB24: 30.0000 mg | ORAL_TABLET | Freq: Two times a day (BID) | ORAL | Status: DC

## 2022-06-13 MED ORDER — SODIUM CHLORIDE 0.9% FLUSH: 3.0000 mL | INTRAVENOUS | Status: DC | PRN

## 2022-06-13 NOTE — Progress Notes (Signed)
Patient ID: Monica Cox, female   DOB: Jul 28, 1991, 31 y.o.   MRN: [id number] Subjective: Postpartum Day 15: Cesarean Delivery Patient reports no complaints Feels much better.    Objective: Vital signs in last 24 hours: Temp:  [97.9 F (36.6 C)-98.4 F (36.9 C)] 97.9 F (36.6 C) (09/08 0558) Pulse Rate:  [80-106] 86 (09/08 0558) Resp:  [15-36] 15 (09/08 0558) BP: (117-190)/(76-125) 138/79 (09/08 0558) SpO2:  [94 %-100 %] 99 % (09/08 0558) Weight:  [86.2 kg] 86.2 kg (09/07 1421)  Physical Exam:  General: alert, cooperative, and no distress Lochia: appropriate Uterine Fundus: firm Incision:  DVT Evaluation: No evidence of DVT seen on physical exam.  Recent Labs    06/12/22 1714  HGB 10.4*  HCT 32.7*    Assessment/Plan: Delayed severe pre eclampsia  Continue magneium today Continue procardia xl 60 Dischrge tomorrow  Lazaro Arms, MD 06/13/2022, 7:54 AM

## 2022-06-13 NOTE — Plan of Care (Signed)
  Problem: Education: Goal: Knowledge of the prescribed therapeutic regimen will improve Outcome: Completed/Met   Problem: Activity: Goal: Risk for activity intolerance will decrease Outcome: Completed/Met   Problem: Nutrition: Goal: Adequate nutrition will be maintained Outcome: Completed/Met   Problem: Coping: Goal: Level of anxiety will decrease Outcome: Completed/Met   Problem: Elimination: Goal: Will not experience complications related to urinary retention Outcome: Completed/Met   Problem: Pain Managment: Goal: General experience of comfort will improve Outcome: Completed/Met

## 2022-06-13 NOTE — H&P (Addendum)
History      [id number]   Arrival date and time: 06/12/22 1643        Chief Complaint  Patient presents with   Hypertension   Headache        HPI Monica Cox is a 31 y.o. s/p primary LTCS for di/di twin gestation on 05/28/2022, hx of postpartum pre-eclampsia x3 in prior pregnancies, who presents for elevated blood pressures.    Patient delivered di/di twins on 05/28/2022 by LTCS BP's were normal during admission She came to MAU on 06/03/2022 reporting severe range BP's at home She had one mild range BP while in MAU and noted to be edematous on exam, she was given PO lasix x5 days and discharged with plan for outpatient BP check in one week Today she went to [facility] and had severe range BP's, she subsequently brought herself to MAU over concerns about her blood pressure and her history of postpartum severe PreE in prior pregnancies   On my exam reports she has had a non-stop headache for the past two days She denies vision changes, chest pain, shortness of breath, or RUQ pain She does endorse significant ongoing lower extremity edema     --/--/O NEG (08/23 0300)   OB History       Gravida  6   Para  5   Term  4   Preterm  1   AB  1   Living  6        SAB  1   IAB  0   Ectopic  0   Multiple  1   Live Births  6                   Past Medical History:  Diagnosis Date   Anemia     Asthma      inhaler as needed   Depression     GERD (gastroesophageal reflux disease)     H/O trichomonas 08/08/2014    2015 pregnancy    History of chlamydia 03/19/2016    2015 pregnancy    History of kidney stones     Mild to moderate pre-eclampsia, postpartum     Nausea 02/06/2016   Postpartum hypertension      pp htn   Reflux     Smoker 02/06/2016   Trichomonas infection 08/08/2014   Vaginal Pap smear, abnormal     Vertigo 09/21/2014    antivert rx 09/21/2014             Past Surgical History:  Procedure Laterality Date   CESAREAN SECTION  MULTI-GESTATIONAL N/A 05/28/2022    Procedure: CESAREAN SECTION MULTI-GESTATIONAL;  Surgeon: Catalina Antigua, MD;  Location: [facility] LD ORS;  Service: Obstetrics;  Laterality: N/A;   DILATION AND CURETTAGE OF UTERUS        [facility] Dr. Despina Hidden performed   ESOPHAGOGASTRODUODENOSCOPY (EGD) WITH PROPOFOL N/A 09/23/2018    Procedure: ESOPHAGOGASTRODUODENOSCOPY (EGD) WITH PROPOFOL;  Surgeon: Corbin Ade, MD;  Location: [facility] ENDO SUITE;  Service: Endoscopy;  Laterality: N/A;  8:45am   ESOPHAGOGASTRODUODENOSCOPY ENDOSCOPY               Family History  Problem Relation Age of Onset   Hypertension Mother     Varicose Veins Father     Ulcers Father     Hypertension Father     Ulcers Paternal Aunt     Ulcers Paternal Grandmother     Diabetes Paternal Grandmother     Liver disease  Neg Hx     Colon cancer Neg Hx     Gastric cancer Neg Hx     Esophageal cancer Neg Hx        Social History         Socioeconomic History   Marital status: Married      Spouse name: Not on file   Number of children: 4   Years of education: Not on file   Highest education level: Not on file  Occupational History   Occupation: RCC      Employer: [employer]      Comment: respiratory therapy  Tobacco Use   Smoking status: Former      Types: Cigars      Quit date: 09/20/2016      Years since quitting: 5.7   Smokeless tobacco: Never  Vaping Use   Vaping Use: Never used  Substance and Sexual Activity   Alcohol use: Not Currently      Comment: occasionally   Drug use: No   Sexual activity: Not Currently      Birth control/protection: None  Other Topics Concern   Not on file  Social History Narrative    ** Merged History Encounter **         Social Determinants of Health        Financial Resource Strain: Low Risk  (12/09/2021)    Overall Financial Resource Strain (CARDIA)     Difficulty of Paying Living Expenses: Not hard at all  Food Insecurity: No Food Insecurity (12/09/2021)    Hunger Vital Sign      Worried About Running Out of Food in the Last Year: Never true     Ran Out of Food in the Last Year: Never true  Transportation Needs: No Transportation Needs (12/09/2021)    PRAPARE - Therapist, art (Medical): No     Lack of Transportation (Non-Medical): No  Physical Activity: Insufficiently Active (12/09/2021)    Exercise Vital Sign     Days of Exercise per Week: 1 day     Minutes of Exercise per Session: 40 min  Stress: Stress Concern Present (12/09/2021)    [employer] of Occupational Health - Occupational Stress Questionnaire     Feeling of Stress : To some extent  Social Connections: Socially Isolated (12/09/2021)    Social Connection and Isolation Panel [NHANES]     Frequency of Communication with Friends and Family: Once a week     Frequency of Social Gatherings with Friends and Family: Once a week     Attends Religious Services: Never     Database administrator or Organizations: No     Attends Banker Meetings: Never     Marital Status: Married  Catering manager Violence: Not At Risk (12/09/2021)    Humiliation, Afraid, Rape, and Kick questionnaire     Fear of Current or Ex-Partner: No     Emotionally Abused: No     Physically Abused: No     Sexually Abused: No           Allergies  Allergen Reactions   Sulfa Antibiotics Nausea And Vomiting   Tape Other (See Comments)      Tears skin      No current facility-administered medications on file prior to encounter.          Current Outpatient Medications on File Prior to Encounter  Medication Sig Dispense Refill   acetaminophen (TYLENOL) 500 MG tablet Take  2 tablets (1,000 mg total) by mouth every 6 (six) hours as needed for mild pain. 30 tablet 0   Blood Pressure Monitor MISC For regular home bp monitoring during pregnancy (Patient not taking: Reported on 06/12/2022) 1 each 0   escitalopram (LEXAPRO) 10 MG tablet TAKE 1 TABLET BY MOUTH EVERY DAY (Patient not taking: Reported on  06/12/2022) 90 tablet 3   ferrous gluconate (FERGON) 324 MG tablet Take 1 tablet (324 mg total) by mouth every other day. 15 tablet 6   ibuprofen (ADVIL) 600 MG tablet Take 1 tablet (600 mg total) by mouth every 6 (six) hours as needed for moderate pain. 30 tablet 0   NIFEdipine (PROCARDIA XL) 30 MG 24 hr tablet Take 1 tablet (30 mg total) by mouth daily. 60 tablet 2   pantoprazole (PROTONIX) 20 MG tablet Take 1 tablet (20 mg total) by mouth daily. (Patient not taking: Reported on 06/12/2022) 30 tablet 2   Prenatal MV & Min w/FA-DHA (PRENATAL GUMMIES PO) Take by mouth. (Patient not taking: Reported on 06/12/2022)            ROS Pertinent positives and negative per HPI, all others reviewed and negative   Physical Exam    BP (!) 183/116   Pulse 91   Temp 98 F (36.7 C) (Oral)   Resp (!) 21   SpO2 95%   Breastfeeding No    Patient Vitals for the past 24 hrs:   BP Temp Temp src Pulse Resp SpO2  06/12/22 1731 (!) 183/116 -- -- 91 -- --  06/12/22 1726 (!) 182/116 -- -- 80 -- --  06/12/22 1711 (!) 181/125 98 F (36.7 C) Oral 88 (!) 21 95 %      Physical Exam Vitals reviewed.  Constitutional:      General: She is not in acute distress.    Appearance: She is well-developed. She is not diaphoretic.  Eyes:     General: No scleral icterus. Pulmonary:     Effort: Pulmonary effort is normal. No respiratory distress.  Abdominal:     General: There is no distension.     Palpations: Abdomen is soft.     Tenderness: There is no abdominal tenderness. There is no guarding or rebound.  Skin:    General: Skin is warm and dry.  Neurological:     Mental Status: She is alert.     Coordination: Coordination normal.        Cervical Exam   Bedside Ultrasound Not done   My interpretation: n/a   FHT N/a   Labs      Results for orders placed or performed during the hospital encounter of 06/12/22 (from the past 24 hour(s))  CBC     Status: Abnormal    Collection Time: 06/12/22  5:14 PM   Result Value Ref Range    WBC 5.0 4.0 - 10.5 K/uL    RBC 3.79 (L) 3.87 - 5.11 MIL/uL    Hemoglobin 10.4 (L) 12.0 - 15.0 g/dL    HCT 00.8 (L) 67.6 - 46.0 %    MCV 86.3 80.0 - 100.0 fL    MCH 27.4 26.0 - 34.0 pg    MCHC 31.8 30.0 - 36.0 g/dL    RDW 19.5 (H) 09.3 - 15.5 %    Platelets 408 (H) 150 - 400 K/uL    nRBC 0.0 0.0 - 0.2 %      Imaging No results found.   MAU Course  Procedures Lab Orders  Comprehensive metabolic panel         CBC         Creatinine, serum          Meds ordered this encounter  Medications   DISCONTD: labetalol (NORMODYNE) injection 20 mg   DISCONTD: labetalol (NORMODYNE) injection 40 mg   DISCONTD: labetalol (NORMODYNE) injection 80 mg   DISCONTD: hydrALAZINE (APRESOLINE) injection 10 mg   acetaminophen (TYLENOL) tablet 650 mg   zolpidem (AMBIEN) tablet 5 mg   docusate sodium (COLACE) capsule 100 mg   calcium carbonate (TUMS - dosed in mg elemental calcium) chewable tablet 400 mg of elemental calcium   prenatal multivitamin tablet 1 tablet   AND Linked Order Group     labetalol (NORMODYNE) injection 20 mg     labetalol (NORMODYNE) injection 40 mg     labetalol (NORMODYNE) injection 80 mg     hydrALAZINE (APRESOLINE) injection 10 mg   magnesium bolus via infusion 4 g   magnesium sulfate 40 grams in SWI 1000 mL OB infusion   lactated ringers infusion   enoxaparin (LOVENOX) injection 40 mg    Imaging Orders  No imaging studies ordered today      MDM High   Assessment and Plan  #Severe pre-eclampsia, post-partum Patient presenting with two days of headache and persistent severe range blood pressures. Labetalol protocol initiated and magnesium gtt ordered. Discussed with Dr. Despina Hidden who agrees with admission to antepartum unit for treatment of severe PP Pre-E.     Dispo: admit to antepartum.        Venora Maples, MD/MPH 06/12/22 5:52 PM   Lazaro Arms, MD

## 2022-06-14 MED ORDER — NIFEDIPINE ER 60 MG PO TB24: 60.0000 mg | ORAL_TABLET | Freq: Every day | ORAL | 0 refills | Status: DC

## 2022-06-14 NOTE — Discharge Summary (Signed)
Physician Discharge Summary  Patient ID: Monica Cox MRN: [id number] DOB/AGE: [birth year] 31 y.o.  Admit date: 06/12/2022 Discharge date: 06/14/2022  Admission Diagnoses: postpartum pre-eclampsia  Discharge Diagnoses:  Principal Problem:   Pre-eclampsia, severe, postpartum condition   Discharged Condition: good  Hospital Course: Patient admitted with postpartum pre-eclampsia 2 weeks following a vaginal delivery. Patient received magnesium sulfate for seizure prophylaxis and was started on procardia. She responded well and was found stable for discharge. She is scheduled to follow-up in the office next week for blood pressure check. Discharge instructions were reviewed with the patient. Patient verbalized understanding and all questions were answered  Consults: None  Discharge Exam: Blood pressure 104/70, pulse 80, temperature 98.3 F (36.8 C), temperature source Oral, resp. rate 16, height 5\' 3"  (1.6 m), weight 86.2 kg, SpO2 99 %, not currently breastfeeding. GENERAL: Well-developed, well-nourished female in no acute distress.  LUNGS: Clear to auscultation bilaterally.  HEART: Regular rate and rhythm. ABDOMEN: Soft, nontender, nondistended. No organomegaly. PELVIC: Not indicated EXTREMITIES: No cyanosis, clubbing, or edema, 2+ distal pulses.   Disposition:  There are no questions and answers to display.         Allergies as of 06/14/2022       Reactions   Sulfa Antibiotics Nausea And Vomiting   Tape Other (See Comments)   Tears skin        Medication List     TAKE these medications    acetaminophen 500 MG tablet Commonly known as: TYLENOL Take 2 tablets (1,000 mg total) by mouth every 6 (six) hours as needed for mild pain.   Blood Pressure Monitor Misc For regular home bp monitoring during pregnancy   escitalopram 10 MG tablet Commonly known as: LEXAPRO TAKE 1 TABLET BY MOUTH EVERY DAY   ferrous gluconate 324 MG tablet Commonly known as:  FERGON Take 1 tablet (324 mg total) by mouth every other day.   ibuprofen 600 MG tablet Commonly known as: ADVIL Take 1 tablet (600 mg total) by mouth every 6 (six) hours as needed for moderate pain.   NIFEdipine 60 MG 24 hr tablet Commonly known as: ADALAT CC Take 1 tablet (60 mg total) by mouth daily. What changed:  medication strength how much to take   pantoprazole 20 MG tablet Commonly known as: Protonix Take 1 tablet (20 mg total) by mouth daily.   PRENATAL GUMMIES PO Take by mouth.        Follow-up Information     [facility] Follow up on 06/19/2022.   Why: As scheduled for BP check Contact information: [address] [place] [phone]                Signed: [phone] 06/14/2022, 9:16 AM

## 2022-07-02 ENCOUNTER — Ambulatory Visit: Payer: Medicaid Other | Admitting: Advanced Practice Midwife

## 2022-07-03 ENCOUNTER — Ambulatory Visit: Payer: Medicaid Other | Admitting: Advanced Practice Midwife

## 2022-07-06 DIAGNOSIS — Z419 Encounter for procedure for purposes other than remedying health state, unspecified: Secondary | ICD-10-CM | POA: Diagnosis not present

## 2022-07-07 ENCOUNTER — Other Ambulatory Visit: Payer: Self-pay | Admitting: Obstetrics and Gynecology

## 2022-07-08 ENCOUNTER — Ambulatory Visit (INDEPENDENT_AMBULATORY_CARE_PROVIDER_SITE_OTHER): Payer: Medicaid Other | Admitting: Women's Health

## 2022-07-08 ENCOUNTER — Encounter: Payer: Self-pay | Admitting: Women's Health

## 2022-07-08 VITALS — BP 140/101 | HR 79 | Ht 63.5 in | Wt 186.2 lb

## 2022-07-08 DIAGNOSIS — Z30013 Encounter for initial prescription of injectable contraceptive: Secondary | ICD-10-CM

## 2022-07-08 DIAGNOSIS — O165 Unspecified maternal hypertension, complicating the puerperium: Secondary | ICD-10-CM

## 2022-07-08 DIAGNOSIS — Z98891 History of uterine scar from previous surgery: Secondary | ICD-10-CM | POA: Diagnosis not present

## 2022-07-08 MED ORDER — MEDROXYPROGESTERONE ACETATE 150 MG/ML IM SUSP
150.0000 mg | Freq: Once | INTRAMUSCULAR | Status: AC
  Administered 2022-07-08: 150 mg via INTRAMUSCULAR

## 2022-07-08 NOTE — Patient Instructions (Signed)
Find out what Dr. Karie Kirks had you on for depression/anxiety tomorrow and let us know when you come in for your bp check (or either have him send you in a refill and follow up with him)

## 2022-07-08 NOTE — Progress Notes (Signed)
POSTPARTUM VISIT Patient name: Monica Cox MRN [id number]  Date of birth: 1991/02/13 Chief Complaint:   Postpartum Care  History of Present Illness:   Monica Cox is a 31 y.o. O2V0350 African American female being seen today for a postpartum visit. She is 6 weeks postpartum following a primary cesarean section, low transverse incision at 36.3 gestational weeks d/t Di-Di twins and PPROM w/ twin B breech and declined vaginal delivery attempt. IOL: no, for n/a. Anesthesia: spinal.  Laceration: n/a.  Complications: Rt uterine extension which led to [place] 3058m. Inpatient contraception: no.   Pregnancy complicated by Di-Di twins and vasa previa w/ twin A that resolved . Tobacco use: former . Substance use disorder: no. Last pap smear: 03/20/21 and results were NILM w/ HRHPV negative. Next pap smear due: 2025 No LMP recorded.  Postpartum course has been complicated by severe pp pre-e requiring hospitalization 2wks pp, currently on nifedipine 660mdaily, forgets to take- hasn't had in 3d. Occ mild headaches . Bleeding  on period now . Bowel function is normal. Bladder function is normal. Urinary incontinence? no, fecal incontinence? no Patient is not sexually active. Last sexual activity: prior to birth of baby. Desired contraception: Depo. Patient does not want a pregnancy in the future.  Desired family size is 6 children.   Upstream - 07/08/22 1635       Pregnancy Intention Screening   Does the patient want to become pregnant in the next year? No    Does the patient's partner want to become pregnant in the next year? No    Would the patient like to discuss contraceptive options today? Yes      Contraception Wrap Up   Current Method Abstinence    Contraception Counseling Provided Yes            The pregnancy intention screening data noted above was reviewed. Potential methods of contraception were discussed. The patient elected to proceed with No data  recorded.  Edinburgh Postpartum Depression Screening: positive, has h/o dep/anx, not currently on meds, has taken lexapro in past. Reports Dr. KnKarie Kirksad her on something that worked really well but she can't remember the name. Appetite increased, not sleeping well even when mom comes over to watch the babies- mind races and gets up. Denies SI/HI/II. Declines IBH referral. Was given list of therapists by pediatrician and plans to make appt w/ someone on that list.   Edinburgh Postnatal Depression Scale - 07/08/22 1623       Edinburgh Postnatal Depression Scale:  In the Past 7 Days   I have been able to laugh and see the funny side of things. 0    I have looked forward with enjoyment to things. 0    I have blamed myself unnecessarily when things went wrong. 2    I have been anxious or worried for no good reason. 3    I have felt scared or panicky for no good reason. 3    Things have been getting on top of me. 2    I have been so unhappy that I have had difficulty sleeping. 3    I have felt sad or miserable. 1    I have been so unhappy that I have been crying. 0    The thought of harming myself has occurred to me. 0    Edinburgh Postnatal Depression Scale Total 14                12/09/2021   11:44  AM  GAD 7 : Generalized Anxiety Score  Nervous, Anxious, on Edge 1  Control/stop worrying 0  Worry too much - different things 0  Trouble relaxing 0  Restless 0  Easily annoyed or irritable 1  Afraid - awful might happen 0  Total GAD 7 Score 2     Babys' course have been uncomplicated. Babies are feeding by bottle. Infant has a pediatrician/family doctor? Yes.  Childcare strategy if returning to work/school: n/a-stay at home mom.  Pt has material needs met for her and baby: Yes.   Review of Systems:   Pertinent items are noted in HPI Denies Abnormal vaginal discharge w/ itching/odor/irritation, headaches, visual changes, shortness of breath, chest pain, abdominal pain, severe  nausea/vomiting, or problems with urination or bowel movements. Pertinent History Reviewed:  Reviewed past medical,surgical, obstetrical and family history.  Reviewed problem list, medications and allergies. OB History  Gravida Para Term Preterm AB Living  _0 SAB IAB Ectopic Multiple Live Births  1 0 0 1 6    # Outcome Date GA Lbr Len/2nd Weight Sex Delivery Anes PTL Lv  6A Preterm 05/28/22 [redacted]w[redacted]d 5 lb 2.9 oz (2.35 kg) F CS-LTranv Spinal  LIV  6B Preterm 05/28/22 35w3d5 lb 11.7 oz (2.6 kg) M CS-LTranv Spinal  LIV  5 Term 01/11/18 3965w0d:19 / 00:02 6 lb 15 oz (3.147 kg) F Vag-Spont Local, EPI  LIV  4 Term 10/05/16 39w55w6d0:20 8 lb 8.3 oz (3.864 kg) F Vag-Spont EPI  LIV  3 Term 11/19/14 38w010w0d8 / 00:10 8 lb 9.4 oz (3.895 kg) F Vag-Spont EPI N LIV     Complications: Pre-eclampsia  2 Term 04/24/09 31w0d39w0d 9 oz (3.43 kg) M Vag-Spont EPI N LIV     Birth Comments: immediate pp HTN  1 SAB            Physical Assessment:   Vitals:   07/08/22 1630  BP: (!) 140/101  Pulse: 79  Weight: 186 lb 4 oz (84.5 kg)  Height: 5' 3.5" (1.613 m)  Body mass index is 32.48 kg/m.       Physical Examination:   General appearance: alert, well appearing, and in no distress  Mental status: alert, oriented to person, place, and time  Skin: warm & dry   Cardiovascular: normal heart rate noted   Respiratory: normal respiratory effort, no distress   Breasts: deferred, no complaints   Abdomen: soft, non-tender, c/s incision well healed, one small area of suture visible in center- clipped  Pelvic: examination not indicated. Thin prep pap obtained: No  Rectal: not examined  Extremities: Edema: none   Chaperone: N/A         No results found for this or any previous visit (from the past 24 hour(s)).  Assessment & Plan:  1) Postpartum exam 2) 6 wks s/p primary cesarean section, low transverse incision @ 36.3wks d/t PPROM w/ twins 3) bottle feeding 4) Depression screening 5)  Contraception management: 1st depo today, condoms x 2wks, rx sent, f/u q 3mths 43m next dose- discussed can increase dep/anx 6) Dep/anx>pt to check w/ Dr. KnowltoKarie Kirksow to see what she was on that worked well in past, either have him refill and f/u w/ him, or let us know[place]oreaed when returns for bp check this week- and will f/u w/ us in [place]. Declines IBH referral, plans to choose therapist from list given to her by pediatrician 7) PPHTN>  forgets to take nifedipine, hasn't had in 3d, set alarm to remind, f/u thurs or fri this week for bp check w/ nurse, take med at least 1hr before appt  Essential components of care per ACOG recommendations:  1.  Mood and well being:  If positive depression screen, discussed and plan developed.  If using tobacco we discussed reduction/cessation and risk of relapse If current substance abuse, we discussed and referral to local resources was offered.   2. Infant care and feeding:  If breastfeeding, discussed returning to work, pumping, breastfeeding-associated pain, guidance regarding return to fertility while lactating if not using another method. If needed, patient was provided with a letter to be allowed to pump q 2-3hrs to support lactation in a private location with access to a refrigerator to store breastmilk.   Recommended that Cox caregivers be immunized for flu, pertussis and other preventable communicable diseases If pt does not have material needs met for her/baby, referred to local resources for help obtaining these.  3. Sexuality, contraception and birth spacing Provided guidance regarding sexuality, management of dyspareunia, and resumption of intercourse Discussed avoiding interpregnancy interval <81mhs and recommended birth spacing of 18 months  4. Sleep and fatigue Discussed coping options for fatigue and sleep disruption Encouraged family/partner/community support of 4 hrs of uninterrupted sleep to help with mood and fatigue  5. Physical  recovery  If pt had a C/S, assessed incisional pain and providing guidance on normal vs prolonged recovery If pt had a laceration, perineal healing and pain reviewed.  If urinary or fecal incontinence, discussed management and referred to PT or uro/gyn if indicated  Patient is safe to resume physical activity. Discussed attainment of healthy weight.  6.  Chronic disease management Discussed pregnancy complications if any, and their implications for future childbearing and long-term maternal health. Review recommendations for prevention of recurrent pregnancy complications, such as 17 hydroxyprogesterone caproate to reduce risk for recurrent PTB not applicable, or aspirin to reduce risk of preeclampsia does not plan future pregnancies. Pt had GDM: no. If yes, 2hr GTT scheduled: not applicable. Reviewed medications and non-pregnant dosing including consideration of whether pt is breastfeeding using a reliable resource such as LactMed: not applicable Referred for f/u w/ PCP or subspecialist providers as indicated: yes  7. Health maintenance Mammogram at [place]yoor earlier if indicated Pap smears as indicated  Meds:  Meds ordered this encounter  Medications   medroxyPROGESTERone (DEPO-PROVERA) injection 150 mg    Follow-up: Return for thurs or fri bp check w/ nurse; 4wks mood f/u; q 314ms for depo.   No orders of the defined types were placed in this encounter.   KiQuebradillasWHBanner Union [facility]0/12/2021 5:18 PM

## 2022-07-10 ENCOUNTER — Ambulatory Visit (INDEPENDENT_AMBULATORY_CARE_PROVIDER_SITE_OTHER): Payer: Medicaid Other | Admitting: *Deleted

## 2022-07-10 ENCOUNTER — Ambulatory Visit: Payer: Medicaid Other | Admitting: Advanced Practice Midwife

## 2022-07-10 DIAGNOSIS — O165 Unspecified maternal hypertension, complicating the puerperium: Secondary | ICD-10-CM

## 2022-07-10 NOTE — Progress Notes (Signed)
   NURSE VISIT- BLOOD PRESSURE CHECK  SUBJECTIVE:  Monica Cox is a 31 y.o. [phone] female here for BP check. She is postpartum, delivery date 05/28/22     HYPERTENSION ROS:  Postpartum:  Severe headaches that don't go away with tylenol/other medicines: No  Visual changes (seeing spots/double/blurred vision) No  Severe pain under right breast breast or in center of upper chest No  Severe nausea/vomiting No  Taking medicines as instructed yes   OBJECTIVE:  BP 111/72 (BP Location: Right Arm, Patient Position: Sitting, Cuff Size: Normal)   Pulse 68   Appearance alert, well appearing, and in no distress.  ASSESSMENT: Postpartum  blood pressure check  PLAN: Discussed with Dr. Nelda Marseille   Recommendations: no changes needed   Follow-up:  in 6 months with PCP or [place]    Alice Rieger  07/10/2022 4:24 PM

## 2022-08-06 ENCOUNTER — Ambulatory Visit: Payer: Medicaid Other | Admitting: Obstetrics and Gynecology

## 2022-08-06 DIAGNOSIS — Z419 Encounter for procedure for purposes other than remedying health state, unspecified: Secondary | ICD-10-CM | POA: Diagnosis not present

## 2022-09-05 DIAGNOSIS — Z419 Encounter for procedure for purposes other than remedying health state, unspecified: Secondary | ICD-10-CM | POA: Diagnosis not present

## 2022-09-22 ENCOUNTER — Telehealth: Payer: Self-pay

## 2022-09-22 ENCOUNTER — Other Ambulatory Visit: Payer: Self-pay | Admitting: Women's Health

## 2022-09-22 MED ORDER — MEDROXYPROGESTERONE ACETATE 150 MG/ML IM SUSP: 150.0000 mg | INTRAMUSCULAR | 3 refills | Status: DC

## 2022-09-22 NOTE — Telephone Encounter (Signed)
Tried to call and notify patient med sent to pharmacy but received no answer and voicemail is full.

## 2022-09-22 NOTE — Telephone Encounter (Signed)
Patient called and stated that she has an appointment on Wednesday for her Depo.  She needs a prescription called in.  Patient states that the pharmacy says the do not have a prescription on file for her.

## 2022-09-24 ENCOUNTER — Ambulatory Visit (INDEPENDENT_AMBULATORY_CARE_PROVIDER_SITE_OTHER): Payer: Medicaid Other

## 2022-09-24 ENCOUNTER — Telehealth: Payer: Self-pay

## 2022-09-24 DIAGNOSIS — Z3042 Encounter for surveillance of injectable contraceptive: Secondary | ICD-10-CM | POA: Diagnosis not present

## 2022-09-24 MED ORDER — MEDROXYPROGESTERONE ACETATE 150 MG/ML IM SUSP
150.0000 mg | Freq: Once | INTRAMUSCULAR | Status: AC
  Administered 2022-09-24: 150 mg via INTRAMUSCULAR

## 2022-09-24 MED ORDER — MEGESTROL ACETATE 40 MG PO TABS: ORAL_TABLET | ORAL | 1 refills | Status: DC

## 2022-09-24 NOTE — Progress Notes (Signed)
   NURSE VISIT- INJECTION  SUBJECTIVE:  Monica Cox is a 31 y.o. [phone] female here for a Depo Provera for contraception/period management. She is a GYN patient.   OBJECTIVE:  There were no vitals taken for this visit.  Appears well, in no apparent distress  Injection administered in: Left deltoid  Meds ordered this encounter  Medications   medroxyPROGESTERone (DEPO-PROVERA) injection 150 mg    ASSESSMENT: GYN patient Depo Provera for contraception/period management PLAN: Follow-up: in 11-13 weeks for next Depo   Janalyn Shy  09/24/2022 3:19 PM

## 2022-09-24 NOTE — Telephone Encounter (Signed)
Will rx megace 

## 2022-09-24 NOTE — Telephone Encounter (Signed)
Patient states that she has been bleeding for 3 months now since starting Depo Provera and would like something prescribed to help stop the bleeding. She uses [facility] in [place]. Please advise.

## 2022-09-25 NOTE — Telephone Encounter (Signed)
Left message on voicemail notifying patient that medication was sent to pharmacy.  

## 2022-10-06 DIAGNOSIS — Z419 Encounter for procedure for purposes other than remedying health state, unspecified: Secondary | ICD-10-CM | POA: Diagnosis not present

## 2022-10-08 ENCOUNTER — Other Ambulatory Visit: Payer: Self-pay | Admitting: Adult Health

## 2022-11-06 DIAGNOSIS — Z419 Encounter for procedure for purposes other than remedying health state, unspecified: Secondary | ICD-10-CM | POA: Diagnosis not present

## 2022-12-05 DIAGNOSIS — Z419 Encounter for procedure for purposes other than remedying health state, unspecified: Secondary | ICD-10-CM | POA: Diagnosis not present

## 2022-12-17 ENCOUNTER — Ambulatory Visit (INDEPENDENT_AMBULATORY_CARE_PROVIDER_SITE_OTHER): Payer: Medicaid Other | Admitting: *Deleted

## 2022-12-17 DIAGNOSIS — Z3042 Encounter for surveillance of injectable contraceptive: Secondary | ICD-10-CM | POA: Diagnosis not present

## 2022-12-17 MED ORDER — MEDROXYPROGESTERONE ACETATE 150 MG/ML IM SUSY
150.0000 mg | PREFILLED_SYRINGE | Freq: Once | INTRAMUSCULAR | Status: AC
  Administered 2022-12-17: 150 mg via INTRAMUSCULAR

## 2022-12-17 NOTE — Progress Notes (Signed)
   NURSE VISIT- INJECTION  SUBJECTIVE:  Monica Cox is a 32 y.o. [phone] female here for a Depo Provera for contraception/period management. She is a GYN patient.   OBJECTIVE:  There were no vitals taken for this visit.  Appears well, in no apparent distress  Injection administered in: Right deltoid  Meds ordered this encounter  Medications   medroxyPROGESTERone Acetate SUSY 150 mg    ASSESSMENT: GYN patient Depo Provera for contraception/period management PLAN: Follow-up: in 11-13 weeks for next Depo   Alice Rieger  12/17/2022 3:35 PM

## 2023-01-05 DIAGNOSIS — Z419 Encounter for procedure for purposes other than remedying health state, unspecified: Secondary | ICD-10-CM | POA: Diagnosis not present

## 2023-01-26 DIAGNOSIS — F53 Postpartum depression: Secondary | ICD-10-CM | POA: Diagnosis not present

## 2023-01-26 DIAGNOSIS — F419 Anxiety disorder, unspecified: Secondary | ICD-10-CM | POA: Diagnosis not present

## 2023-01-26 DIAGNOSIS — R030 Elevated blood-pressure reading, without diagnosis of hypertension: Secondary | ICD-10-CM | POA: Diagnosis not present

## 2023-01-26 DIAGNOSIS — F900 Attention-deficit hyperactivity disorder, predominantly inattentive type: Secondary | ICD-10-CM | POA: Diagnosis not present

## 2023-02-04 DIAGNOSIS — Z419 Encounter for procedure for purposes other than remedying health state, unspecified: Secondary | ICD-10-CM | POA: Diagnosis not present

## 2023-03-07 DIAGNOSIS — Z419 Encounter for procedure for purposes other than remedying health state, unspecified: Secondary | ICD-10-CM | POA: Diagnosis not present

## 2023-03-11 ENCOUNTER — Ambulatory Visit: Payer: Medicaid Other

## 2023-03-12 ENCOUNTER — Ambulatory Visit (INDEPENDENT_AMBULATORY_CARE_PROVIDER_SITE_OTHER): Payer: Medicaid Other | Admitting: *Deleted

## 2023-03-12 DIAGNOSIS — Z3042 Encounter for surveillance of injectable contraceptive: Secondary | ICD-10-CM | POA: Diagnosis not present

## 2023-03-12 MED ORDER — MEDROXYPROGESTERONE ACETATE 150 MG/ML IM SUSP
150.0000 mg | Freq: Once | INTRAMUSCULAR | Status: AC
  Administered 2023-03-12: 150 mg via INTRAMUSCULAR

## 2023-03-12 NOTE — Progress Notes (Signed)
   NURSE VISIT- INJECTION  SUBJECTIVE:  Monica Cox is a 32 y.o. [phone] female here for a Depo Provera for contraception/period management. She is a GYN patient.   OBJECTIVE:  There were no vitals taken for this visit.  Appears well, in no apparent distress  Injection administered in: Left deltoid  Meds ordered this encounter  Medications   medroxyPROGESTERone (DEPO-PROVERA) injection 150 mg    ASSESSMENT: GYN patient Depo Provera for contraception/period management PLAN: Follow-up: in 11-13 weeks for next Depo   Jobe Marker  03/12/2023 2:59 PM

## 2023-04-06 DIAGNOSIS — Z419 Encounter for procedure for purposes other than remedying health state, unspecified: Secondary | ICD-10-CM | POA: Diagnosis not present

## 2023-05-07 DIAGNOSIS — Z419 Encounter for procedure for purposes other than remedying health state, unspecified: Secondary | ICD-10-CM | POA: Diagnosis not present

## 2023-06-04 ENCOUNTER — Ambulatory Visit: Payer: Medicaid Other

## 2023-06-07 DIAGNOSIS — Z419 Encounter for procedure for purposes other than remedying health state, unspecified: Secondary | ICD-10-CM | POA: Diagnosis not present

## 2023-07-07 DIAGNOSIS — Z419 Encounter for procedure for purposes other than remedying health state, unspecified: Secondary | ICD-10-CM | POA: Diagnosis not present

## 2023-08-07 DIAGNOSIS — Z419 Encounter for procedure for purposes other than remedying health state, unspecified: Secondary | ICD-10-CM | POA: Diagnosis not present

## 2023-08-24 ENCOUNTER — Encounter: Payer: Self-pay | Admitting: Women's Health

## 2023-08-24 ENCOUNTER — Other Ambulatory Visit (HOSPITAL_COMMUNITY)
Admission: RE | Admit: 2023-08-24 | Discharge: 2023-08-24 | Disposition: A | Discharge status: Home / Self Care | Payer: BC Managed Care – PPO | Source: Ambulatory Visit | Attending: Women's Health | Admitting: Women's Health

## 2023-08-24 ENCOUNTER — Ambulatory Visit (INDEPENDENT_AMBULATORY_CARE_PROVIDER_SITE_OTHER): Payer: BC Managed Care – PPO | Admitting: Women's Health

## 2023-08-24 VITALS — BP 119/79 | HR 88 | Ht 64.0 in | Wt 199.6 lb

## 2023-08-24 DIAGNOSIS — Z3202 Encounter for pregnancy test, result negative: Secondary | ICD-10-CM | POA: Diagnosis not present

## 2023-08-24 DIAGNOSIS — N6452 Nipple discharge: Secondary | ICD-10-CM | POA: Diagnosis not present

## 2023-08-24 DIAGNOSIS — N926 Irregular menstruation, unspecified: Secondary | ICD-10-CM | POA: Insufficient documentation

## 2023-08-24 DIAGNOSIS — Z30013 Encounter for initial prescription of injectable contraceptive: Secondary | ICD-10-CM

## 2023-08-24 LAB — POCT URINE PREGNANCY: Preg Test, Ur: NEGATIVE

## 2023-08-24 MED ORDER — MEDROXYPROGESTERONE ACETATE 150 MG/ML IM SUSP: 150.0000 mg | INTRAMUSCULAR | 3 refills | Status: DC

## 2023-08-24 MED ORDER — MEDROXYPROGESTERONE ACETATE 150 MG/ML IM SUSY
150.0000 mg | PREFILLED_SYRINGE | Freq: Once | INTRAMUSCULAR | Status: AC
  Administered 2023-08-24: 150 mg via INTRAMUSCULAR

## 2023-08-24 NOTE — Progress Notes (Signed)
GYN VISIT Patient name: Monica Cox MRN [id number]  Date of birth: August 23, 1991 Chief Complaint:   left breast leaking (Wants to start back on Depo)  History of Present Illness:   Monica Cox is a 33 y.o. [phone] African-American female being seen today for report of Lt breast discharge, clear, unsure of when it started, noticed it last week when cleaning her room w/o clothes on, noticed arm was wet and looked at breasts and was coming from Lt nipple. Clear in color, no blood.  No obvious lumps/masses. PGM may have had breast cancer, was in her 72s.  Irregular periods, denies abnormal discharge, itching/odor/irritation.  Wants to restart depo.  No LMP recorded. (Menstrual status: Irregular Periods). The current method of family planning is none.  Last pap 03/20/21. Results were: NILM w/ HRHPV negative     12/09/2021   11:33 AM 06/25/2017    3:34 PM  Depression screen PHQ 2/9  Decreased Interest 0 3  Down, Depressed, Hopeless 0 3  PHQ - 2 Score 0 6  Altered sleeping 1 3  Tired, decreased energy 0 3  Change in appetite 0 2  Feeling bad or failure about yourself  0 3  Trouble concentrating 0 2  Moving slowly or fidgety/restless 0 1  Suicidal thoughts 0 0  PHQ-9 Score 1 20  Difficult doing work/chores  Extremely dIfficult        12/09/2021   11:44 AM  GAD 7 : Generalized Anxiety Score  Nervous, Anxious, on Edge 1  Control/stop worrying 0  Worry too much - different things 0  Trouble relaxing 0  Restless 0  Easily annoyed or irritable 1  Afraid - awful might happen 0  Total GAD 7 Score 2     Review of Systems:   Pertinent items are noted in HPI Denies fever/chills, dizziness, headaches, visual disturbances, fatigue, shortness of breath, chest pain, abdominal pain, vomiting, abnormal vaginal discharge/itching/odor/irritation, problems with periods, bowel movements, urination, or intercourse unless otherwise stated above.  Pertinent History Reviewed:  Reviewed  past medical,surgical, social, obstetrical and family history.  Reviewed problem list, medications and allergies. Physical Assessment:   Vitals:   08/24/23 1014  BP: 119/79  Pulse: 88  Weight: 199 lb 9.6 oz (90.5 kg)  Height: 5\' 4"  (1.626 m)  Body mass index is 34.26 kg/m.       Physical Examination:   General appearance: alert, well appearing, and in no distress  Mental status: alert, oriented to person, place, and time  Skin: warm & dry   Cardiovascular: normal heart rate noted  Respiratory: normal respiratory effort, no distress  Breasts - breasts appear normal, no suspicious masses, no skin or nipple changes or axillary nodes; no drainage from Lt nipple, pt unable to produce w/ squeezing. Small crack on nipple where pt says was coming from, also was coming from side of nipple where piercing goes in.   Abdomen: soft, non-tender   Pelvic: VULVA: normal appearing vulva with no masses, tenderness or lesions, VAGINA: normal appearing vagina with normal color and discharge, no lesions, CERVIX: normal appearing cervix without discharge or lesions  Extremities: no edema   Chaperone: Faith Rogue    Results for orders placed or performed in visit on 08/24/23 (from the past 24 hour(s))  POCT urine pregnancy   Collection Time: 08/24/23 10:25 AM  Result Value Ref Range   Preg Test, Ur Negative Negative    Assessment & Plan:  1) Lt breast discharge> unable to produce today  so couldn't send slide; will check TSH, prolactin (can come back this week, did breast exam today), offered continued monitoring vs u/s, pt states she would like to proceed w/ u/s & mammo to make sure everything ok, orders placed and note routed to [facility] to schedule, may lose insurance at end of month, wants to get in before then  2) Contraception management> depo today, condoms x 2wks, rx sent, f/u 11-13wk for next dose  3) Irregular periods>CV swab  Meds: No orders of the defined types were placed in this  encounter.   Orders Placed This Encounter  Procedures   [place] LIMITED ULTRASOUND INCLUDING AXILLA LEFT BREAST    [place] LIMITED ULTRASOUND INCLUDING AXILLA RIGHT BREAST   MM Digital Diagnostic Unilat L   TSH   Prolactin   POCT urine pregnancy    Return for 11-13wks for Depo injection; June for pap & physical.  Cheral Marker CNM, [facility] 08/24/2023 10:46 AM

## 2023-08-25 LAB — CERVICOVAGINAL ANCILLARY ONLY
Bacterial Vaginitis (gardnerella): NEGATIVE
Candida Glabrata: NEGATIVE
Candida Vaginitis: NEGATIVE
Chlamydia: NEGATIVE
Comment: NEGATIVE
Comment: NEGATIVE
Comment: NEGATIVE
Comment: NEGATIVE
Comment: NEGATIVE
Comment: NORMAL
Neisseria Gonorrhea: NEGATIVE
Trichomonas: NEGATIVE

## 2023-09-06 DIAGNOSIS — Z419 Encounter for procedure for purposes other than remedying health state, unspecified: Secondary | ICD-10-CM | POA: Diagnosis not present

## 2023-09-24 ENCOUNTER — Ambulatory Visit (HOSPITAL_COMMUNITY)
Admission: RE | Admit: 2023-09-24 | Discharge: 2023-09-24 | Disposition: A | Discharge status: Home / Self Care | Payer: Medicaid Other | Source: Ambulatory Visit | Attending: Women's Health | Admitting: Women's Health

## 2023-09-24 ENCOUNTER — Encounter (HOSPITAL_COMMUNITY): Payer: Self-pay

## 2023-09-24 DIAGNOSIS — N6452 Nipple discharge: Secondary | ICD-10-CM

## 2023-09-24 DIAGNOSIS — R92323 Mammographic fibroglandular density, bilateral breasts: Secondary | ICD-10-CM | POA: Diagnosis not present

## 2023-09-28 ENCOUNTER — Encounter: Payer: Self-pay | Admitting: Women's Health

## 2023-10-06 ENCOUNTER — Other Ambulatory Visit: Payer: Self-pay | Admitting: Women's Health

## 2023-10-06 ENCOUNTER — Encounter: Payer: Self-pay | Admitting: Women's Health

## 2023-10-06 DIAGNOSIS — N6452 Nipple discharge: Secondary | ICD-10-CM

## 2023-10-07 DIAGNOSIS — Z419 Encounter for procedure for purposes other than remedying health state, unspecified: Secondary | ICD-10-CM | POA: Diagnosis not present

## 2023-11-06 ENCOUNTER — Encounter: Payer: Self-pay | Admitting: Women's Health

## 2023-11-07 DIAGNOSIS — Z419 Encounter for procedure for purposes other than remedying health state, unspecified: Secondary | ICD-10-CM | POA: Diagnosis not present

## 2023-11-12 ENCOUNTER — Ambulatory Visit
Admission: RE | Admit: 2023-11-12 | Discharge: 2023-11-12 | Disposition: A | Discharge status: Home / Self Care | Payer: Medicaid Other | Source: Ambulatory Visit | Attending: Women's Health | Admitting: Women's Health

## 2023-11-12 ENCOUNTER — Encounter: Payer: Self-pay | Admitting: Women's Health

## 2023-11-12 DIAGNOSIS — N6452 Nipple discharge: Secondary | ICD-10-CM

## 2023-11-12 MED ORDER — GADOPICLENOL 0.5 MMOL/ML IV SOLN
10.0000 mL | Freq: Once | INTRAVENOUS | Status: AC | PRN
Start: 2023-11-12 — End: 2023-11-12
  Administered 2023-11-12: 10 mL via INTRAVENOUS

## 2023-11-13 ENCOUNTER — Ambulatory Visit: Payer: Medicaid Other

## 2023-11-17 ENCOUNTER — Ambulatory Visit: Payer: Medicaid Other

## 2023-11-19 ENCOUNTER — Ambulatory Visit (INDEPENDENT_AMBULATORY_CARE_PROVIDER_SITE_OTHER): Payer: Medicaid Other | Admitting: Women's Health

## 2023-11-19 ENCOUNTER — Encounter: Payer: Self-pay | Admitting: Women's Health

## 2023-11-19 VITALS — BP 133/86 | HR 93 | Ht 64.0 in | Wt 201.0 lb

## 2023-11-19 DIAGNOSIS — L409 Psoriasis, unspecified: Secondary | ICD-10-CM | POA: Diagnosis not present

## 2023-11-19 DIAGNOSIS — N6452 Nipple discharge: Secondary | ICD-10-CM | POA: Diagnosis not present

## 2023-11-19 DIAGNOSIS — Z3042 Encounter for surveillance of injectable contraceptive: Secondary | ICD-10-CM

## 2023-11-19 DIAGNOSIS — Z30013 Encounter for initial prescription of injectable contraceptive: Secondary | ICD-10-CM | POA: Diagnosis not present

## 2023-11-19 DIAGNOSIS — Z3202 Encounter for pregnancy test, result negative: Secondary | ICD-10-CM | POA: Diagnosis not present

## 2023-11-19 LAB — POCT URINE PREGNANCY: Preg Test, Ur: NEGATIVE

## 2023-11-19 MED ORDER — MEDROXYPROGESTERONE ACETATE 150 MG/ML IM SUSY
150.0000 mg | PREFILLED_SYRINGE | Freq: Once | INTRAMUSCULAR | Status: AC
  Administered 2023-11-19: 150 mg via INTRAMUSCULAR

## 2023-11-19 NOTE — Progress Notes (Addendum)
GYN VISIT Patient name: Monica Cox MRN [id number]  Date of birth: [birth year] Chief Complaint:   talk about test result (depo)  History of Present Illness:   Monica Cox is a 33 y.o. [phone] African-American female being seen today for depo and to discuss recent breast imaging results. Also has flaky scales in scalp and ears, wants referral to dermatology. Saw her 08/24/23 for Lt breast w/ clear discharge, 2d after visit turned to bloody. Still having bloody discharge, sometimes pus.   12/19 Mammo & u/s:  IMPRESSION: Multiple mildly dilated fluid/debris-filled ducts in the left breast without definite intraductal mass in this patient with history of clear/bloody left nipple discharge. There are no findings of malignancy in either breast.  RECOMMENDATION: Recommend further evaluation with bilateral breast MRI with contrast given history of spontaneous bloody left nipple discharge.  11/12/23 MRI:  IMPRESSION: 1. No MR findings to suggest a cause for this patient's bloody LEFT nipple discharge. Surgical consultation/follow-up is recommended. 2. No MR evidence of breast malignancy. RECOMMENDATION: Surgical consultation for bloody LEFT nipple discharge. BI-RADS CATEGORY  1: Negative.  No LMP recorded. Patient has had an injection. The current method of family planning is Depo-Provera injections.  Last pap 03/20/21. Results were: NILM w/ HRHPV negative     12/09/2021   11:33 AM 06/25/2017    3:34 PM  Depression screen PHQ 2/9  Decreased Interest 0 3  Down, Depressed, Hopeless 0 3  PHQ - 2 Score 0 6  Altered sleeping 1 3  Tired, decreased energy 0 3  Change in appetite 0 2  Feeling bad or failure about yourself  0 3  Trouble concentrating 0 2  Moving slowly or fidgety/restless 0 1  Suicidal thoughts 0 0  PHQ-9 Score 1 20  Difficult doing work/chores  Extremely dIfficult        12/09/2021   11:44 AM  GAD 7 : Generalized Anxiety Score  Nervous, Anxious, on  Edge 1  Control/stop worrying 0  Worry too much - different things 0  Trouble relaxing 0  Restless 0  Easily annoyed or irritable 1  Afraid - awful might happen 0  Total GAD 7 Score 2     Review of Systems:   Pertinent items are noted in HPI Denies fever/chills, dizziness, headaches, visual disturbances, fatigue, shortness of breath, chest pain, abdominal pain, vomiting, abnormal vaginal discharge/itching/odor/irritation, problems with periods, bowel movements, urination, or intercourse unless otherwise stated above.  Pertinent History Reviewed:  Reviewed past medical,surgical, social, obstetrical and family history.  Reviewed problem list, medications and allergies. Physical Assessment:   Vitals:   11/19/23 1353  BP: 133/86  Pulse: 93  Weight: 201 lb (91.2 kg)  Height: 5\' 4"  (1.626 m)  Body mass index is 34.5 kg/m.       Physical Examination:   General appearance: alert, well appearing, and in no distress  Mental status: alert, oriented to person, place, and time  Skin: warm & dry, ? Scalp psoriasis   Cardiovascular: normal heart rate noted  Respiratory: normal respiratory effort, no distress  Abdomen: soft, non-tender   Pelvic: examination not indicated  Extremities: no edema   Chaperone: N/A    Results for orders placed or performed in visit on 11/19/23 (from the past 24 hours)  POCT urine pregnancy   Collection Time: 11/19/23  2:11 PM  Result Value Ref Range   Preg Test, Ur Negative Negative    Assessment & Plan:  1) Lt bloody nipple discharge> s/p neg mamm/us/MRI,  MD at MRI recommended surgical consult. I sent MyChart message but pt hadn't read. Discussed today. OK w/ referral to any surgeon. Note routed to [facility] to refer to Dr. Dwain Sarna w/ [facility] in [place]>she faxed info and [facility] will call pt to schedule  2) Contraception management> depo today  3) ? Scalp psoriasis> referral to dermatology  Meds:  Meds ordered this encounter  Medications    medroxyPROGESTERone Acetate SUSY 150 mg    Orders Placed This Encounter  Procedures   Ambulatory referral to Dermatology   POCT urine pregnancy    Return for June for , Pap & physical, 11-13wks from now for Depo injection.  Cheral Marker CNM, [facility] 11/19/2023 2:39 PM

## 2023-11-29 ENCOUNTER — Encounter (HOSPITAL_COMMUNITY): Payer: Self-pay

## 2023-11-29 ENCOUNTER — Emergency Department (HOSPITAL_COMMUNITY)
Admission: EM | Admit: 2023-11-29 | Discharge: 2023-11-29 | Disposition: A | Discharge status: Home / Self Care | Payer: Medicaid Other | Attending: Emergency Medicine | Admitting: Emergency Medicine

## 2023-11-29 ENCOUNTER — Other Ambulatory Visit: Payer: Self-pay

## 2023-11-29 DIAGNOSIS — Z79899 Other long term (current) drug therapy: Secondary | ICD-10-CM | POA: Insufficient documentation

## 2023-11-29 DIAGNOSIS — R112 Nausea with vomiting, unspecified: Secondary | ICD-10-CM

## 2023-11-29 DIAGNOSIS — R1110 Vomiting, unspecified: Secondary | ICD-10-CM | POA: Diagnosis present

## 2023-11-29 DIAGNOSIS — E876 Hypokalemia: Secondary | ICD-10-CM | POA: Insufficient documentation

## 2023-11-29 DIAGNOSIS — R197 Diarrhea, unspecified: Secondary | ICD-10-CM | POA: Diagnosis not present

## 2023-11-29 DIAGNOSIS — J101 Influenza due to other identified influenza virus with other respiratory manifestations: Secondary | ICD-10-CM | POA: Diagnosis not present

## 2023-11-29 LAB — CBC
HCT: 40.8 % (ref 36.0–46.0)
Hemoglobin: 13.7 g/dL (ref 12.0–15.0)
MCH: 29.8 pg (ref 26.0–34.0)
MCHC: 33.6 g/dL (ref 30.0–36.0)
MCV: 88.7 fL (ref 80.0–100.0)
Platelets: 223 10*3/uL (ref 150–400)
RBC: 4.6 MIL/uL (ref 3.87–5.11)
RDW: 13.3 % (ref 11.5–15.5)
WBC: 2.5 10*3/uL — ABNORMAL LOW (ref 4.0–10.5)
nRBC: 0 % (ref 0.0–0.2)

## 2023-11-29 LAB — LIPASE, BLOOD: Lipase: 25 U/L (ref 11–51)

## 2023-11-29 LAB — COMPREHENSIVE METABOLIC PANEL
ALT: 29 U/L (ref 0–44)
AST: 40 U/L (ref 15–41)
Albumin: 4 g/dL (ref 3.5–5.0)
Alkaline Phosphatase: 81 U/L (ref 38–126)
Anion gap: 14 (ref 5–15)
BUN: 9 mg/dL (ref 6–20)
CO2: 19 mmol/L — ABNORMAL LOW (ref 22–32)
Calcium: 9 mg/dL (ref 8.9–10.3)
Chloride: 107 mmol/L (ref 98–111)
Creatinine, Ser: 0.75 mg/dL (ref 0.44–1.00)
GFR, Estimated: >60 mL/min (ref >60)
Glucose, Bld: 93 mg/dL (ref 70–99)
Potassium: 2.9 mmol/L — ABNORMAL LOW (ref 3.5–5.1)
Sodium: 140 mmol/L (ref 135–145)
Total Bilirubin: 0.7 mg/dL (ref 0.0–1.2)
Total Protein: 7.8 g/dL (ref 6.5–8.1)

## 2023-11-29 LAB — URINALYSIS, ROUTINE W REFLEX MICROSCOPIC
Bacteria, UA: NONE SEEN
Glucose, UA: NEGATIVE mg/dL
Hgb urine dipstick: NEGATIVE
Ketones, ur: 20 mg/dL — AB
Leukocytes,Ua: NEGATIVE
Nitrite: NEGATIVE
Protein, ur: 100 mg/dL — AB
Specific Gravity, Urine: 1.031 — ABNORMAL HIGH (ref 1.005–1.030)
pH: 5 (ref 5.0–8.0)

## 2023-11-29 LAB — RESP PANEL BY RT-PCR (RSV, FLU A&B, COVID)  RVPGX2
Influenza A by PCR: POSITIVE — AB
Influenza B by PCR: NEGATIVE
Resp Syncytial Virus by PCR: NEGATIVE
SARS Coronavirus 2 by RT PCR: NEGATIVE

## 2023-11-29 LAB — HCG, SERUM, QUALITATIVE: Preg, Serum: NEGATIVE

## 2023-11-29 LAB — MAGNESIUM: Magnesium: 1.9 mg/dL (ref 1.7–2.4)

## 2023-11-29 MED ORDER — NAPROXEN 500 MG PO TABS
500.0000 mg | ORAL_TABLET | Freq: Two times a day (BID) | ORAL | 0 refills | Status: AC
Start: 2023-11-29 — End: ?

## 2023-11-29 MED ORDER — KETOROLAC TROMETHAMINE 15 MG/ML IJ SOLN
15.0000 mg | Freq: Once | INTRAMUSCULAR | Status: AC
  Administered 2023-11-29: 15 mg via INTRAVENOUS
  Filled 2023-11-29: qty 1

## 2023-11-29 MED ORDER — LACTATED RINGERS IV BOLUS
500.0000 mL | Freq: Once | INTRAVENOUS | Status: AC
  Administered 2023-11-29: 500 mL via INTRAVENOUS

## 2023-11-29 MED ORDER — ONDANSETRON 4 MG PO TBDP: 4.0000 mg | ORAL_TABLET | Freq: Three times a day (TID) | ORAL | 0 refills | Status: AC | PRN

## 2023-11-29 MED ORDER — POTASSIUM CHLORIDE ER 10 MEQ PO TBCR: 10.0000 meq | EXTENDED_RELEASE_TABLET | Freq: Every day | ORAL | 0 refills | Status: DC

## 2023-11-29 MED ORDER — SODIUM CHLORIDE 0.9 % IV BOLUS
1000.0000 mL | Freq: Once | INTRAVENOUS | Status: AC
  Administered 2023-11-29: 1000 mL via INTRAVENOUS

## 2023-11-29 MED ORDER — ONDANSETRON HCL 4 MG/2ML IJ SOLN
4.0000 mg | Freq: Once | INTRAMUSCULAR | Status: AC
  Administered 2023-11-29: 4 mg via INTRAVENOUS
  Filled 2023-11-29: qty 2

## 2023-11-29 MED ORDER — POTASSIUM CHLORIDE 20 MEQ PO PACK
60.0000 meq | PACK | Freq: Once | ORAL | Status: AC
  Administered 2023-11-29: 60 meq via ORAL
  Filled 2023-11-29: qty 3

## 2023-11-29 MED ORDER — PROMETHAZINE-DM 6.25-15 MG/5ML PO SYRP: 5.0000 mL | ORAL_SOLUTION | Freq: Four times a day (QID) | ORAL | 0 refills | Status: AC | PRN

## 2023-11-29 NOTE — Discharge Instructions (Signed)
 Please follow-up closely with your primary care doctor on an outpatient basis.  Return to emergency department immediately for any new or worsening symptoms.

## 2023-11-29 NOTE — ED Provider Notes (Signed)
 [name] Provider Note   CSN: [id number] Arrival date & time: 11/29/23  1306     History  Chief Complaint  Patient presents with   Emesis    Monica Cox is a 33 y.o. female.  Patient is a 33 year old female who presents the emergency department the chief complaint of cough, congestion, body aches, nausea, vomiting, diarrhea which has been ongoing and intermittent for the past 2 weeks.  She notes it has become worse over the past few days.  She has been exposed to other members in her family who have been sick with similar symptoms.  Patient currently denies any associated chest pain or shortness of breath.  She has had no abnormal headaches, pain to neck or back or abnormal rashes.   Emesis Associated symptoms: cough        Home Medications Prior to Admission medications   Medication Sig Start Date End Date Taking? Authorizing Provider  acetaminophen (TYLENOL) 500 MG tablet Take 2 tablets (1,000 mg total) by mouth every 6 (six) hours as needed for mild pain. 05/31/22   Arabella Merles, CNM  amphetamine-dextroamphetamine (ADDERALL) 10 MG tablet Take 10 mg by mouth 2 (two) times daily. 08/12/23   [provider]  Blood Pressure Monitor MISC For regular home bp monitoring during pregnancy 12/09/21   Cheral Marker, CNM  busPIRone (BUSPAR) 10 MG tablet Take 10 mg by mouth 2 (two) times daily. 08/11/23   [provider]  citalopram (CELEXA) 20 MG tablet Take 20 mg by mouth daily. 05/12/23   [provider]  escitalopram (LEXAPRO) 10 MG tablet TAKE 1 TABLET BY MOUTH EVERY DAY Patient not taking: Reported on 11/19/2023 01/30/22   Cheral Marker, CNM  ferrous gluconate (FERGON) 324 MG tablet Take 1 tablet (324 mg total) by mouth every other day. 04/04/22 05/04/22  Myna Hidalgo, DO  ibuprofen (ADVIL) 800 MG tablet Take 800 mg by mouth 3 (three) times daily. Patient not taking: Reported on 11/19/2023  08/12/23   [provider]  medroxyPROGESTERone (DEPO-PROVERA) 150 MG/ML injection Inject 1 mL (150 mg total) into the muscle every 3 (three) months. 08/24/23   Cheral Marker, CNM      Allergies    Sulfa antibiotics and Tape    Review of Systems   Review of Systems  Constitutional:  Positive for fatigue.  Respiratory:  Positive for cough.   Gastrointestinal:  Positive for nausea and vomiting.  All other systems reviewed and are negative.   Physical Exam Updated Vital Signs BP (!) 128/96 (BP Location: Right Arm)   Pulse (!) 101   Temp 98.8 F (37.1 C) (Oral)   Resp 18   SpO2 98%  Physical Exam Vitals reviewed.  Constitutional:      Appearance: Normal appearance.  HENT:     Head: Normocephalic and atraumatic.     Nose: Nose normal.     Mouth/Throat:     Mouth: Mucous membranes are moist.  Eyes:     Extraocular Movements: Extraocular movements intact.     Conjunctiva/sclera: Conjunctivae normal.     Pupils: Pupils are equal, round, and reactive to light.  Cardiovascular:     Rate and Rhythm: Normal rate and regular rhythm.     Pulses: Normal pulses.     Heart sounds: Normal heart sounds.  Pulmonary:     Effort: Pulmonary effort is normal. No respiratory distress.     Breath sounds: Normal breath sounds. No stridor.  No wheezing, rhonchi or rales.  Abdominal:     General: Abdomen is flat. Bowel sounds are normal. There is no distension.     Palpations: Abdomen is soft.     Tenderness: There is no abdominal tenderness. There is no right CVA tenderness, left CVA tenderness or guarding.  Musculoskeletal:        General: No swelling or tenderness. Normal range of motion.     Cervical back: Normal range of motion and neck supple.  Skin:    General: Skin is warm and dry.     Findings: No rash.  Neurological:     General: No focal deficit present.     Mental Status: She is alert and oriented to person, place, and time. Mental status is at baseline.   Psychiatric:        Mood and Affect: Mood normal.        Behavior: Behavior normal.        Thought Content: Thought content normal.        Judgment: Judgment normal.     ED Results / Procedures / Treatments   Labs (all labs ordered are listed, but only abnormal results are displayed) Labs Reviewed  RESP PANEL BY RT-PCR (RSV, FLU A&B, COVID)  RVPGX2 - Abnormal; Notable for the following components:      Result Value   Influenza A by PCR POSITIVE (*)    All other components within normal limits  COMPREHENSIVE METABOLIC PANEL - Abnormal; Notable for the following components:   Potassium 2.9 (*)    CO2 19 (*)    All other components within normal limits  CBC - Abnormal; Notable for the following components:   WBC 2.5 (*)    All other components within normal limits  URINALYSIS, ROUTINE W REFLEX MICROSCOPIC - Abnormal; Notable for the following components:   Color, Urine AMBER (*)    APPearance HAZY (*)    Specific Gravity, Urine 1.031 (*)    Bilirubin Urine SMALL (*)    Ketones, ur 20 (*)    Protein, ur 100 (*)    All other components within normal limits  LIPASE, BLOOD  HCG, SERUM, QUALITATIVE  MAGNESIUM    EKG None  Radiology No results found.  Procedures Procedures    Medications Ordered in ED Medications  sodium chloride 0.9 % bolus 1,000 mL (0 mLs Intravenous Stopped 11/29/23 1523)  ketorolac (TORADOL) 15 MG/ML injection 15 mg (15 mg Intravenous Given 11/29/23 1415)  ondansetron (ZOFRAN) injection 4 mg (4 mg Intravenous Given 11/29/23 1415)  potassium chloride (KLOR-CON) packet 60 mEq (60 mEq Oral Given 11/29/23 1527)  lactated ringers bolus 500 mL (500 mLs Intravenous New Bag/Given 11/29/23 1550)    ED Course/ Medical Decision Making/ A&P                                 Medical Decision Making Amount and/or Complexity of Data Reviewed Labs: ordered.  Risk Prescription drug management.   This patient presents to the ED for concern of cough, congestion,  body aches, nausea, vomiting, diarrhea differential diagnosis includes acute viral syndrome, pneumonia, appendicitis, cholecystitis, small bowel obstruction, urinary tract infection, ovarian torsion or cyst    Additional history obtained:  Additional history obtained from none External records from outside source obtained and reviewed including none   Lab Tests:  I Ordered, and personally interpreted labs.  The pertinent results include: Hypokalemia, positive for influenza A  Medicines ordered and prescription drug management:  I ordered medication including Zofran, Toradol, IV fluids for nausea, vomiting, diarrhea, influenza Reevaluation of the patient after these medicines showed that the patient improved I have reviewed the patients home medicines and have made adjustments as needed   Problem List / ED Course:  Patient is doing much better at this time and is stable for discharge home.  She has had no further nausea or vomiting in the emergency department.  Discussed with patient she is positive for influenza A.  I do suspect that this is the cause of her symptoms.  Patient has a benign abdominal exam with no focal tenderness throughout.  Do not suspect acute appendicitis, cholecystitis, bowel obstruction, diverticulitis, ovarian torsion or cyst, PID, tubo-ovarian abscess, pyelonephritis, kidney stone.  Potassium was repleted in the emergency department and magnesium was within normal limits.  Will provide a few additional days of potassium on an outpatient basis.  Will continue symptomatic treatment on outpatient basis at this time.  Strict return precautions were discussed for any new or worsening symptoms.  Patient voiced understanding and had no additional questions.   Social Determinants of Health:  None           Final Clinical Impression(s) / ED Diagnoses Final diagnoses:  None    Rx / DC Orders ED Discharge Orders     None         Kathlen Mody 11/29/23 1614    Benjiman Core, MD 12/05/23 [phone]

## 2023-11-29 NOTE — ED Triage Notes (Signed)
 Pt c/o n/v/d, diarrhea for the past two weeks. Children have been sick at home as well.

## 2023-12-05 DIAGNOSIS — Z419 Encounter for procedure for purposes other than remedying health state, unspecified: Secondary | ICD-10-CM | POA: Diagnosis not present

## 2023-12-07 ENCOUNTER — Other Ambulatory Visit: Payer: Self-pay | Admitting: Surgery

## 2023-12-07 DIAGNOSIS — N6452 Nipple discharge: Secondary | ICD-10-CM | POA: Diagnosis not present

## 2023-12-18 ENCOUNTER — Encounter (HOSPITAL_BASED_OUTPATIENT_CLINIC_OR_DEPARTMENT_OTHER): Payer: Self-pay | Admitting: Surgery

## 2023-12-21 ENCOUNTER — Other Ambulatory Visit: Payer: Self-pay

## 2023-12-21 ENCOUNTER — Encounter (HOSPITAL_BASED_OUTPATIENT_CLINIC_OR_DEPARTMENT_OTHER): Payer: Self-pay | Admitting: Surgery

## 2023-12-23 NOTE — Progress Notes (Signed)
 Patient was provided with CHG cleanser to use at home before the procedure. Patient verbalized understanding of instructions.Patient was provided with CHG cleanser to use at home before the procedure. Patient verbalized understanding of instructions.     Enhanced Recovery after Surgery Enhanced Recovery after Surgery is a protocol used to improve the stress on your body and your recovery after surgery.  Patient Instructions  The night before surgery:  No food after midnight. ONLY clear liquids after midnight  The day of surgery (if you do NOT have diabetes):  Drink ONE (1) Pre-Surgery Clear Ensure as directed.   This drink was given to you during your hospital  pre-op appointment visit. The pre-op nurse will instruct you on the time to drink the  Pre-Surgery Ensure depending on your surgery time. Finish the drink at the designated time by the pre-op nurse.  Nothing else to drink after completing the  Pre-Surgery Clear Ensure.  The day of surgery (if you have diabetes): Drink ONE (1) Gatorade 2 (G2) as directed. This drink was given to you during your hospital  pre-op appointment visit.  The pre-op nurse will instruct you on the time to drink the   Gatorade 2 (G2) depending on your surgery time. Color of the Gatorade may vary. Red is not allowed. Nothing else to drink after completing the  Gatorade 2 (G2).         If you have questions, please contact your surgeon's office.

## 2023-12-25 ENCOUNTER — Encounter (HOSPITAL_BASED_OUTPATIENT_CLINIC_OR_DEPARTMENT_OTHER)
Admission: RE | Disposition: A | Discharge status: Home / Self Care | Payer: Self-pay | Source: Home / Self Care | Attending: Surgery

## 2023-12-25 ENCOUNTER — Ambulatory Visit (HOSPITAL_BASED_OUTPATIENT_CLINIC_OR_DEPARTMENT_OTHER)
Admission: RE | Admit: 2023-12-25 | Discharge: 2023-12-25 | Disposition: A | Discharge status: Home / Self Care | Attending: Surgery | Admitting: Surgery

## 2023-12-25 ENCOUNTER — Ambulatory Visit (HOSPITAL_BASED_OUTPATIENT_CLINIC_OR_DEPARTMENT_OTHER): Admitting: Anesthesiology

## 2023-12-25 ENCOUNTER — Encounter (HOSPITAL_BASED_OUTPATIENT_CLINIC_OR_DEPARTMENT_OTHER): Payer: Self-pay | Admitting: Surgery

## 2023-12-25 ENCOUNTER — Other Ambulatory Visit: Payer: Self-pay

## 2023-12-25 DIAGNOSIS — Z87891 Personal history of nicotine dependence: Secondary | ICD-10-CM | POA: Insufficient documentation

## 2023-12-25 DIAGNOSIS — N6452 Nipple discharge: Secondary | ICD-10-CM | POA: Diagnosis not present

## 2023-12-25 DIAGNOSIS — N6042 Mammary duct ectasia of left breast: Secondary | ICD-10-CM | POA: Insufficient documentation

## 2023-12-25 DIAGNOSIS — Z5986 Financial insecurity: Secondary | ICD-10-CM | POA: Diagnosis not present

## 2023-12-25 DIAGNOSIS — Z803 Family history of malignant neoplasm of breast: Secondary | ICD-10-CM | POA: Insufficient documentation

## 2023-12-25 DIAGNOSIS — J45909 Unspecified asthma, uncomplicated: Secondary | ICD-10-CM | POA: Diagnosis not present

## 2023-12-25 DIAGNOSIS — Z01818 Encounter for other preprocedural examination: Secondary | ICD-10-CM

## 2023-12-25 HISTORY — PX: EXCISION OF ACCESSORY NIPPLE: SHX5819

## 2023-12-25 LAB — POCT PREGNANCY, URINE: Preg Test, Ur: NEGATIVE

## 2023-12-25 SURGERY — EXCISION, ACCESSORY NIPPLE
Anesthesia: General | Site: Breast | Laterality: Left

## 2023-12-25 MED ORDER — PROPOFOL 10 MG/ML IV BOLUS
INTRAVENOUS | Status: AC
  Filled 2023-12-25: qty 20

## 2023-12-25 MED ORDER — MIDAZOLAM HCL 2 MG/2ML IJ SOLN
INTRAMUSCULAR | Status: AC
  Filled 2023-12-25: qty 2

## 2023-12-25 MED ORDER — OXYCODONE HCL 5 MG PO TABS: 5.0000 mg | ORAL_TABLET | Freq: Once | ORAL | Status: DC | PRN

## 2023-12-25 MED ORDER — ONDANSETRON HCL 4 MG/2ML IJ SOLN
INTRAMUSCULAR | Status: DC | PRN
  Administered 2023-12-25: 4 mg via INTRAVENOUS

## 2023-12-25 MED ORDER — BUPIVACAINE-EPINEPHRINE (PF) 0.5% -1:200000 IJ SOLN
INTRAMUSCULAR | Status: DC | PRN
  Administered 2023-12-25: 20 mL

## 2023-12-25 MED ORDER — ONDANSETRON HCL 4 MG/2ML IJ SOLN
INTRAMUSCULAR | Status: AC
  Filled 2023-12-25: qty 2

## 2023-12-25 MED ORDER — ACETAMINOPHEN 500 MG PO TABS: 1000.0000 mg | ORAL_TABLET | ORAL | Status: DC

## 2023-12-25 MED ORDER — FENTANYL CITRATE (PF) 100 MCG/2ML IJ SOLN
INTRAMUSCULAR | Status: AC
  Filled 2023-12-25: qty 2

## 2023-12-25 MED ORDER — LIDOCAINE 2% (20 MG/ML) 5 ML SYRINGE
INTRAMUSCULAR | Status: DC | PRN
Start: 2023-12-25 — End: 2023-12-25
  Administered 2023-12-25: 60 mg via INTRAVENOUS

## 2023-12-25 MED ORDER — CEFAZOLIN SODIUM-DEXTROSE 2-4 GM/100ML-% IV SOLN
INTRAVENOUS | Status: AC
  Filled 2023-12-25: qty 100

## 2023-12-25 MED ORDER — ACETAMINOPHEN 500 MG PO TABS
1000.0000 mg | ORAL_TABLET | Freq: Once | ORAL | Status: AC
  Administered 2023-12-25: 1000 mg via ORAL

## 2023-12-25 MED ORDER — PROPOFOL 10 MG/ML IV BOLUS
INTRAVENOUS | Status: DC | PRN
  Administered 2023-12-25: 40 mg via INTRAVENOUS
  Administered 2023-12-25: 160 mg via INTRAVENOUS

## 2023-12-25 MED ORDER — CHLORHEXIDINE GLUCONATE CLOTH 2 % EX PADS: 6.0000 | MEDICATED_PAD | Freq: Once | CUTANEOUS | Status: DC

## 2023-12-25 MED ORDER — LIDOCAINE 2% (20 MG/ML) 5 ML SYRINGE
INTRAMUSCULAR | Status: AC
  Filled 2023-12-25: qty 5

## 2023-12-25 MED ORDER — OXYCODONE HCL 5 MG/5ML PO SOLN: 5.0000 mg | Freq: Once | ORAL | Status: DC | PRN

## 2023-12-25 MED ORDER — FENTANYL CITRATE (PF) 100 MCG/2ML IJ SOLN: 25.0000 ug | INTRAMUSCULAR | Status: DC | PRN

## 2023-12-25 MED ORDER — ACETAMINOPHEN 500 MG PO TABS
ORAL_TABLET | ORAL | Status: AC
  Filled 2023-12-25: qty 2

## 2023-12-25 MED ORDER — ACETAMINOPHEN 500 MG PO TABS: 1000.0000 mg | ORAL_TABLET | Freq: Once | ORAL | Status: DC | PRN

## 2023-12-25 MED ORDER — MIDAZOLAM HCL 5 MG/5ML IJ SOLN
INTRAMUSCULAR | Status: DC | PRN
  Administered 2023-12-25: 2 mg via INTRAVENOUS

## 2023-12-25 MED ORDER — ACETAMINOPHEN 160 MG/5ML PO SOLN: 1000.0000 mg | Freq: Once | ORAL | Status: DC | PRN

## 2023-12-25 MED ORDER — FENTANYL CITRATE (PF) 100 MCG/2ML IJ SOLN
INTRAMUSCULAR | Status: DC | PRN
Start: 2023-12-25 — End: 2023-12-25
  Administered 2023-12-25 (×2): 50 ug via INTRAVENOUS

## 2023-12-25 MED ORDER — OXYCODONE HCL 5 MG PO TABS: 5.0000 mg | ORAL_TABLET | Freq: Four times a day (QID) | ORAL | 0 refills | Status: AC | PRN

## 2023-12-25 MED ORDER — ACETAMINOPHEN 10 MG/ML IV SOLN: 1000.0000 mg | Freq: Once | INTRAVENOUS | Status: DC | PRN

## 2023-12-25 MED ORDER — CEFAZOLIN SODIUM-DEXTROSE 2-4 GM/100ML-% IV SOLN
2.0000 g | INTRAVENOUS | Status: AC
  Administered 2023-12-25: 2 g via INTRAVENOUS

## 2023-12-25 MED ORDER — LACTATED RINGERS IV SOLN: INTRAVENOUS | Status: DC

## 2023-12-25 MED ORDER — DEXAMETHASONE SODIUM PHOSPHATE 10 MG/ML IJ SOLN
INTRAMUSCULAR | Status: DC | PRN
Start: 2023-12-25 — End: 2023-12-25
  Administered 2023-12-25: 5 mg via INTRAVENOUS

## 2023-12-25 SURGICAL SUPPLY — 35 items
BLADE SURG 15 STRL LF DISP TIS (BLADE) ×1 IMPLANT
CANISTER SUCT 1200ML W/VALVE (MISCELLANEOUS) ×1 IMPLANT
CHLORAPREP W/TINT 26 (MISCELLANEOUS) ×1 IMPLANT
CLIP TI WIDE RED SMALL 6 (CLIP) IMPLANT
COVER BACK TABLE 60X90IN (DRAPES) ×1 IMPLANT
COVER MAYO STAND STRL (DRAPES) ×1 IMPLANT
DERMABOND ADVANCED .7 DNX12 (GAUZE/BANDAGES/DRESSINGS) ×1 IMPLANT
DRAPE LAPAROTOMY 100X72 PEDS (DRAPES) ×1 IMPLANT
DRAPE UTILITY XL STRL (DRAPES) ×1 IMPLANT
ELECT REM PT RETURN 9FT ADLT (ELECTROSURGICAL) ×1 IMPLANT
ELECTRODE REM PT RTRN 9FT ADLT (ELECTROSURGICAL) ×1 IMPLANT
GAUZE SPONGE 4X4 12PLY STRL (GAUZE/BANDAGES/DRESSINGS) ×1 IMPLANT
GLOVE BIO SURGEON STRL SZ7 (GLOVE) ×1 IMPLANT
GLOVE BIOGEL PI IND STRL 7.0 (GLOVE) IMPLANT
GLOVE SURG SIGNA 7.5 PF LTX (GLOVE) ×1 IMPLANT
GLOVE SURG SS PI 6.5 STRL IVOR (GLOVE) IMPLANT
GOWN STRL REUS W/ TWL LRG LVL3 (GOWN DISPOSABLE) ×1 IMPLANT
GOWN STRL REUS W/ TWL XL LVL3 (GOWN DISPOSABLE) ×1 IMPLANT
KIT MARKER MARGIN INK (KITS) IMPLANT
NDL HYPO 25X1 1.5 SAFETY (NEEDLE) ×1 IMPLANT
NEEDLE HYPO 25X1 1.5 SAFETY (NEEDLE) ×1 IMPLANT
NS IRRIG 1000ML POUR BTL (IV SOLUTION) IMPLANT
PACK BASIN DAY SURGERY FS (CUSTOM PROCEDURE TRAY) ×1 IMPLANT
PENCIL SMOKE EVACUATOR (MISCELLANEOUS) ×1 IMPLANT
SLEEVE SCD COMPRESS KNEE MED (STOCKING) ×1 IMPLANT
SPIKE FLUID TRANSFER (MISCELLANEOUS) IMPLANT
SPONGE T-LAP 4X18 ~~LOC~~+RFID (SPONGE) ×1 IMPLANT
SUT MNCRL AB 4-0 PS2 18 (SUTURE) ×1 IMPLANT
SUT SILK 2 0 SH (SUTURE) ×1 IMPLANT
SUT VIC AB 3-0 SH 27X BRD (SUTURE) ×1 IMPLANT
SYR CONTROL 10ML LL (SYRINGE) ×1 IMPLANT
TOWEL GREEN STERILE FF (TOWEL DISPOSABLE) ×1 IMPLANT
TRAY FAXITRON CT DISP (TRAY / TRAY PROCEDURE) IMPLANT
TUBE CONNECTING 20X1/4 (TUBING) IMPLANT
YANKAUER SUCT BULB TIP NO VENT (SUCTIONS) ×1 IMPLANT

## 2023-12-25 NOTE — Transfer of Care (Signed)
 Immediate Anesthesia Transfer of Care Note  Patient: Monica Cox  Procedure(s) Performed: LEFT BREAST DUCTAL EXCISION (Left: Breast)  Patient Location: PACU  Anesthesia Type:General  Level of Consciousness: awake  Airway & Oxygen Therapy: Patient Spontanous Breathing and Patient connected to face mask oxygen  Post-op Assessment: Report given to RN and Post -op Vital signs reviewed and stable  Post vital signs: Reviewed and stable  Last Vitals:  Vitals Value Taken Time  BP 143/79 12/25/23 1524  Temp    Pulse 105 12/25/23 1525  Resp 21 12/25/23 1525  SpO2 100 % 12/25/23 1525  Vitals shown include unfiled device data.  Last Pain:  Vitals:   12/25/23 1336  TempSrc: Oral  PainSc: 0-No pain      Patients Stated Pain Goal: 7 (12/25/23 1336)  Complications: No notable events documented.

## 2023-12-25 NOTE — H&P (Signed)
 REFERRING PHYSICIAN: Doristine Johns* PROVIDER: Wayne Both, MD MRN: [id number] DOB: 05-Oct-1991  Subjective   Chief Complaint: New Consultation (Bloody Discharge Lft Nipple)  History of Present Illness: Monica Cox is a 33 y.o. female who is seen  as an office consultation for evaluation of New Consultation (Bloody Discharge Lft Nipple)  This is a 33 year old female referred here for spontaneous bloody nipple discharge. She reports this started in October or November of last year. It was spontaneous and initially clear but then became bloody. She had no pain in the breast. She has had a nipple piercing in place for at least 4 years and has had no previous issue from this. She has no previous problems regarding her breast other than difficulty breast-feeding from that breast with her kids. There is a remote family history of breast cancer in paternal cousins. She is uncertain whether her grand mother may have had some issues with her breast. She underwent bilateral mammograms and ultrasounds. This showed some dilations in the ducts at the 1 o'clock position of the left breast with some debris. She then had a follow-up bilateral breast MRI which was otherwise unremarkable. There were no findings to suggest malignancy on the imaging  Review of Systems: A complete review of systems was obtained from the patient. I have reviewed this information and discussed as appropriate with the patient. See HPI as well for other ROS.  ROS   Medical History: Past Medical History:  Diagnosis Date  Anxiety  Asthma, unspecified asthma severity, unspecified whether complicated, unspecified whether persistent ([facility]HS-HCC)  GERD (gastroesophageal reflux disease)   Patient Active Problem List  Diagnosis  Bloody discharge from left nipple   History reviewed. No pertinent surgical history.   No Known Allergies  No current outpatient medications on file prior to visit.   No current  facility-administered medications on file prior to visit.   Family History  Problem Relation Age of Onset  High blood pressure (Hypertension) Mother  High blood pressure (Hypertension) Father    Social History   Tobacco Use  Smoking Status Former  Types: Cigarettes  Smokeless Tobacco Not on file    Social History   Socioeconomic History  Marital status: Married  Tobacco Use  Smoking status: Former  Types: Cigarettes  Vaping Use  Vaping status: Unknown  Substance and Sexual Activity  Alcohol use: Yes  Drug use: Never   Social Drivers of Corporate investment banker Strain: Medium Risk (07/08/2022)  Received from [facility]  Overall Financial Resource Strain (CARDIA)  Difficulty of Paying Living Expenses: Somewhat hard  Food Insecurity: No Food Insecurity (07/08/2022)  Received from [facility]  Hunger Vital Sign  Worried About Running Out of Food in the Last Year: Never true  Ran Out of Food in the Last Year: Never true  Transportation Needs: No Transportation Needs (07/08/2022)  Received from [facility] - Transportation  Lack of Transportation (Medical): No  Lack of Transportation (Non-Medical): No  Physical Activity: Inactive (07/08/2022)  Received from [facility]  Exercise Vital Sign  Days of Exercise per Week: 0 days  Minutes of Exercise per Session: 0 min  Stress: Stress Concern Present (07/08/2022)  Received from [facility] - Occupational Stress Questionnaire  Feeling of Stress : Rather much  Social Connections: Moderately Isolated (07/08/2022)  Received from [facility]  Social Connection and Isolation Panel [NHANES]  Frequency of Communication with Friends and Family: Once a week  Frequency of  Social Gatherings with Friends and Family: Once a week  Attends Religious Services: 1 to 4 times per year  Active Member of [employer] or Organizations: No  Attends Engineer, structural: Never  Marital  Status: Married   Objective:   Vitals:   BP: 107/83  Pulse: 90  Temp: 36.7 C (98.1 F)  SpO2: 98%  Weight: 89.4 kg (197 lb 3.2 oz)  Height: 162.6 cm (5\' 4" )  PainSc: 7   Body mass index is 33.85 kg/m.  Physical Exam   She appears well on exam  Her breasts are large. There is bilateral nipple piercing. The nipple is otherwise normal in appearance I cannot elicit any discharge today. The areola is normal as well. There are no palpable masses and no axillary adenopathy  Labs, Imaging and Diagnostic Testing: I have reviewed her mammograms, ultrasound, and MRI  Assessment and Plan:   Left breast bloody nipple discharge  I discussed the diagnosis with the patient and her friend. I discussed the findings on all the imaging. Because of the dilated ducts and bloody nipple discharge, I ductal excision of the left breast at the 1 o'clock position is recommended for complete histologic evaluation to rule out malignancy. This may be secondary to the piercing of the nipple which I have asked her to remove. This also could be from a papilloma but again malignancy needs to be excluded. I discussed the reasons for this with her in detail. I explained the surgical procedure in detail. We discussed the risks which includes but is not limited to bleeding, infection, injury to surrounding structures, nipple necrosis, cardiopulmonary issues with anesthesia, the need for further surgery should malignancy be found, ongoing nipple discharge, etc. She understands and agrees to proceed with surgery which will be scheduled

## 2023-12-25 NOTE — Discharge Instructions (Addendum)
 [facility] Office Phone Number [phone]  BREAST BIOPSY/ PARTIAL MASTECTOMY: POST OP INSTRUCTIONS  Always review your discharge instruction sheet given to you by the facility where your surgery was performed.  IF YOU HAVE DISABILITY OR FAMILY LEAVE FORMS, YOU MUST BRING THEM TO THE OFFICE FOR PROCESSING.  DO NOT GIVE THEM TO YOUR DOCTOR.  A prescription for pain medication may be given to you upon discharge.  Take your pain medication as prescribed, if needed.  If narcotic pain medicine is not needed, then you may take acetaminophen (Tylenol) or ibuprofen (Advil) as needed. Take your usually prescribed medications unless otherwise directed If you need a refill on your pain medication, please contact your pharmacy.  They will contact our office to request authorization.  Prescriptions will not be filled after 5pm or on week-ends. You should eat very light the first 24 hours after surgery, such as soup, crackers, pudding, etc.  Resume your normal diet the day after surgery. Most patients will experience some swelling and bruising in the breast.  Ice packs and a good support bra will help.  Swelling and bruising can take several days to resolve.  It is common to experience some constipation if taking pain medication after surgery.  Increasing fluid intake and taking a stool softener will usually help or prevent this problem from occurring.  A mild laxative (Milk of Magnesia or Miralax) should be taken according to package directions if there are no bowel movements after 48 hours. Unless discharge instructions indicate otherwise, you may remove your bandages 24-48 hours after surgery, and you may shower at that time.  You may have steri-strips (small skin tapes) in place directly over the incision.  These strips should be left on the skin for 7-10 days.  If your surgeon used skin glue on the incision, you may shower in 24 hours.  The glue will flake off over the next 2-3 weeks.  Any  sutures or staples will be removed at the office during your follow-up visit. ACTIVITIES:  You may resume regular daily activities (gradually increasing) beginning the next day.  Wearing a good support bra or sports bra minimizes pain and swelling.  You may have sexual intercourse when it is comfortable. You may drive when you no longer are taking prescription pain medication, you can comfortably wear a seatbelt, and you can safely maneuver your car and apply brakes. RETURN TO WORK:  ______________________________________________________________________________________ Monica Cox should see your doctor in the office for a follow-up appointment approximately two weeks after your surgery.  Your doctor's nurse will typically make your follow-up appointment when she calls you with your pathology report.  Expect your pathology report 2-3 business days after your surgery.  You may call to check if you do not hear from [place] after three days. OTHER INSTRUCTIONS: YOU MAY SHOWER STARTING TOMORROW ICE PACK, TYLENOL, AND IBUPROFEN ALSO FOR PAIN NO VIGOROUS ACTIVITY FOR ONE WEEK  EXPECT DRAINAGE FROM THE NIPPLE _____________________________________________________________________________________________________________________________________ _____________________________________________________________________________________________________________________________________ _____________________________________________________________________________________________________________________________________  WHEN TO CALL YOUR DOCTOR: Fever over 101.0 Nausea and/or vomiting. Extreme swelling or bruising. Continued bleeding from incision. Increased pain, redness, or drainage from the incision.  The clinic staff is available to answer your questions during regular business hours.  Please don't hesitate to call and ask to speak to one of the nurses for clinical concerns.  If you have a medical emergency, go to the nearest  emergency room or call 911.  A surgeon from [facility] is always on call at the hospital.  For further questions, please visit [email or web address]    Post Anesthesia Home Care Instructions  Activity: Get plenty of rest for the remainder of the day. A responsible individual must stay with you for 24 hours following the procedure.  For the next 24 hours, DO NOT: -Drive a car -Advertising copywriter -Drink alcoholic beverages -Take any medication unless instructed by your physician -Make any legal decisions or sign important papers.  Meals: Start with liquid foods such as gelatin or soup. Progress to regular foods as tolerated. Avoid greasy, spicy, heavy foods. If nausea and/or vomiting occur, drink only clear liquids until the nausea and/or vomiting subsides. Call your physician if vomiting continues.  Special Instructions/Symptoms: Your throat may feel dry or sore from the anesthesia or the breathing tube placed in your throat during surgery. If this causes discomfort, gargle with warm salt water. The discomfort should disappear within 24 hours.  If you had a scopolamine patch placed behind your ear for the management of post- operative nausea and/or vomiting:  1. The medication in the patch is effective for 72 hours, after which it should be removed.  Wrap patch in a tissue and discard in the trash. Wash hands thoroughly with soap and water. 2. You may remove the patch earlier than 72 hours if you experience unpleasant side effects which may include dry mouth, dizziness or visual disturbances. 3. Avoid touching the patch. Wash your hands with soap and water after contact with the patch.

## 2023-12-25 NOTE — Op Note (Signed)
 LEFT BREAST DUCTAL EXCISION  Procedure Note  LANAI CONLEE 12/25/2023   Pre-op Diagnosis: LEFT BREAST BLOODY NIPPLE DISCHARGE     Post-op Diagnosis: same  Procedure(s): LEFT BREAST DUCTAL EXCISION  Surgeon(s): Abigail Miyamoto, MD  Anesthesia: General  Staff:  Circulator: Lenn Cal, RN Scrub Person: Rolla Etienne  Estimated Blood Loss: Minimal               Specimens: retro areolar breast tissue 1 to 3 o'clock left breast  Indications: This is a 33 year old female who presents with several months of clear and bloody nipple discharge from the left breast.  Mammograms and ultrasound showed dilated ducts at the 1 o'clock position of the left breast.  MRI showed no other abnormalities.  The decision was made to proceed with a central breast ductal excision at the 1:00 area of the left breast  Procedure: The patient was brought to the operating identifies the correct patient.  She was placed supine on the operating room table and general anesthesia was induced.  Her left breast was prepped and draped in the usual sterile fashion.  I anesthetized the lateral edge of the areola with Marcaine.  I then made a circumareolar incision from the 1 to 3 o'clock position of the left breast.  I then dissected down to the breast tissue and then medially toward the nipple with electrocautery.  Several dilated ducts were identified with milky substance coming out.  I excised this area breast tissue all the way to beneath the nipple.  I then marked all margins of the specimen which included tissue from the 1 to 3 o'clock position of the left breast with marking paint.  The specimen was then sent to pathology for evaluation.  I can palpate no other abnormalities in the remaining breast tissue.  I injected the area further with Marcaine.  Hemostasis was achieved with the cautery.  I then closed the subcutaneous tissue with interrupted 3-0 Vicryl sutures and closed the skin with a running  4-0 Monocryl.  Dermabond was then applied.  The patient tolerated the procedure well.  All the counts were correct at the end of the procedure.  The patient was then extubated in the operating room and taken in a stable condition to the recovery room.          Abigail Miyamoto   Date: 12/25/2023  Time: 3:18 PM

## 2023-12-25 NOTE — Anesthesia Procedure Notes (Signed)
 Procedure Name: LMA Insertion Date/Time: 12/25/2023 2:48 PM  Performed by: Roosvelt Harps, CRNAPre-anesthesia Checklist: Patient identified, Emergency Drugs available, Suction available and Patient being monitored Patient Re-evaluated:Patient Re-evaluated prior to induction Oxygen Delivery Method: Circle System Utilized Preoxygenation: Pre-oxygenation with 100% oxygen Induction Type: IV induction Ventilation: Mask ventilation without difficulty LMA: LMA inserted LMA Size: 4.0 Number of attempts: 1 Airway Equipment and Method: Bite block Placement Confirmation: positive ETCO2 Tube secured with: Tape Dental Injury: Teeth and Oropharynx as per pre-operative assessment

## 2023-12-25 NOTE — Interval H&P Note (Signed)
 History and Physical Interval Note:no change in H and P  12/25/2023 2:19 PM  Monica Cox  has presented today for surgery, with the diagnosis of LEFT BREAST BLOODY NIPPLE DISCHARGE.  The various methods of treatment have been discussed with the patient and family. After consideration of risks, benefits and other options for treatment, the patient has consented to  Procedure(s) with comments: EXCISION LEFT ACCESSORY NIPPLE AND LEFT BREAST DUCTAL EXCISION (Left) - LEFT BREAST DUCTAL EXCISION as a surgical intervention.  The patient's history has been reviewed, patient examined, no change in status, stable for surgery.  I have reviewed the patient's chart and labs.  Questions were answered to the patient's satisfaction.     Abigail Miyamoto

## 2023-12-25 NOTE — Anesthesia Preprocedure Evaluation (Addendum)
 Anesthesia Evaluation  Patient identified by MRN, date of birth, ID band Patient awake    Reviewed: Allergy & Precautions, NPO status , Patient's Chart, lab work & pertinent test results  History of Anesthesia Complications Negative for: history of anesthetic complications  Airway Mallampati: II  TM Distance: >3 FB Neck ROM: Full    Dental  (+) Dental Advisory Given, Teeth Intact,    Pulmonary neg shortness of breath, asthma , neg sleep apnea, neg COPD, neg recent URI, former smoker   breath sounds clear to auscultation       Cardiovascular hypertension, (-) angina (-) Past MI  Rhythm:Regular     Neuro/Psych neg Seizures PSYCHIATRIC DISORDERS Anxiety Depression       GI/Hepatic   Endo/Other  negative endocrine ROS    Renal/GU negative Renal ROS     Musculoskeletal negative musculoskeletal ROS (+)  Lab Results      Component                Value               Date                      PREGTESTUR               NEGATIVE            12/25/2023                PREGSERUM                NEGATIVE            11/29/2023              Abdominal   Peds  Hematology negative hematology ROS (+) Lab Results      Component                Value               Date                      WBC                      2.5 (L)             11/29/2023                HGB                      13.7                11/29/2023                HCT                      40.8                11/29/2023                MCV                      88.7                11/29/2023                PLT  223                 11/29/2023              Anesthesia Other Findings   Reproductive/Obstetrics                             Anesthesia Physical Anesthesia Plan  ASA: 2  Anesthesia Plan: General   Post-op Pain Management: Tylenol PO (pre-op)*   Induction: Intravenous  PONV Risk Score and Plan: 3 and Ondansetron and  Dexamethasone  Airway Management Planned: LMA  Additional Equipment: None  Intra-op Plan:   Post-operative Plan: Extubation in OR  Informed Consent: I have reviewed the patients History and Physical, chart, labs and discussed the procedure including the risks, benefits and alternatives for the proposed anesthesia with the patient or authorized representative who has indicated his/her understanding and acceptance.     Dental advisory given  Plan Discussed with: CRNA  Anesthesia Plan Comments:        Anesthesia Quick Evaluation

## 2023-12-26 NOTE — Anesthesia Postprocedure Evaluation (Signed)
 Anesthesia Post Note  Patient: Monica Cox  Procedure(s) Performed: LEFT BREAST DUCTAL EXCISION (Left: Breast)     Patient location during evaluation: PACU Anesthesia Type: General Level of consciousness: awake and alert Pain management: pain level controlled Vital Signs Assessment: post-procedure vital signs reviewed and stable Respiratory status: spontaneous breathing, nonlabored ventilation and respiratory function stable Cardiovascular status: blood pressure returned to baseline and stable Postop Assessment: no apparent nausea or vomiting Anesthetic complications: no   No notable events documented.  Last Vitals:  Vitals:   12/25/23 1530 12/25/23 1545  BP:  139/76  Pulse: 92 90  Resp: 20 16  Temp:  (!) 36.2 C  SpO2: 99% 100%    Last Pain:  Vitals:   12/25/23 1545  TempSrc:   PainSc: 0-No pain                 Stephene Alegria

## 2023-12-27 ENCOUNTER — Encounter (HOSPITAL_BASED_OUTPATIENT_CLINIC_OR_DEPARTMENT_OTHER): Payer: Self-pay | Admitting: Surgery

## 2023-12-28 LAB — SURGICAL PATHOLOGY

## 2024-01-16 DIAGNOSIS — Z419 Encounter for procedure for purposes other than remedying health state, unspecified: Secondary | ICD-10-CM | POA: Diagnosis not present

## 2024-02-11 ENCOUNTER — Ambulatory Visit: Payer: Medicaid Other | Admitting: *Deleted

## 2024-02-11 DIAGNOSIS — Z3042 Encounter for surveillance of injectable contraceptive: Secondary | ICD-10-CM

## 2024-02-11 MED ORDER — MEDROXYPROGESTERONE ACETATE 150 MG/ML IM SUSP
150.0000 mg | Freq: Once | INTRAMUSCULAR | Status: AC
  Administered 2024-02-11: 150 mg via INTRAMUSCULAR

## 2024-02-11 NOTE — Progress Notes (Signed)
   NURSE VISIT- INJECTION  SUBJECTIVE:  Monica Cox is a 33 y.o. [phone] female here for a Depo Provera  for contraception/period management. She is a GYN patient.   OBJECTIVE:  There were no vitals taken for this visit.  Appears well, in no apparent distress  Injection administered in: Right deltoid  Meds ordered this encounter  Medications   medroxyPROGESTERone  (DEPO-PROVERA ) injection 150 mg    ASSESSMENT: GYN patient Depo Provera  for contraception/period management PLAN: Follow-up: in 11-13 weeks for next Depo   Kerrie Peek  02/11/2024 3:49 PM

## 2024-02-15 DIAGNOSIS — Z419 Encounter for procedure for purposes other than remedying health state, unspecified: Secondary | ICD-10-CM | POA: Diagnosis not present

## 2024-03-17 DIAGNOSIS — Z419 Encounter for procedure for purposes other than remedying health state, unspecified: Secondary | ICD-10-CM | POA: Diagnosis not present

## 2024-04-16 DIAGNOSIS — Z419 Encounter for procedure for purposes other than remedying health state, unspecified: Secondary | ICD-10-CM | POA: Diagnosis not present

## 2024-05-05 ENCOUNTER — Ambulatory Visit: Admitting: *Deleted

## 2024-05-05 DIAGNOSIS — Z3042 Encounter for surveillance of injectable contraceptive: Secondary | ICD-10-CM

## 2024-05-05 MED ORDER — MEDROXYPROGESTERONE ACETATE 150 MG/ML IM SUSP
150.0000 mg | Freq: Once | INTRAMUSCULAR | Status: AC
  Administered 2024-05-05: 150 mg via INTRAMUSCULAR

## 2024-05-05 NOTE — Progress Notes (Signed)
   NURSE VISIT- INJECTION  SUBJECTIVE:  Monica Cox is a 33 y.o. [phone] female here for a Depo Provera  for contraception/period management. She is a GYN patient.   OBJECTIVE:  There were no vitals taken for this visit.  Appears well, in no apparent distress  Injection administered in: Left deltoid  Meds ordered this encounter  Medications   medroxyPROGESTERone  (DEPO-PROVERA ) injection 150 mg    ASSESSMENT: GYN patient Depo Provera  for contraception/period management PLAN: Follow-up: in 11-13 weeks for next Depo   Rutherford Rover  05/05/2024 4:01 PM

## 2024-05-17 DIAGNOSIS — Z419 Encounter for procedure for purposes other than remedying health state, unspecified: Secondary | ICD-10-CM | POA: Diagnosis not present

## 2024-06-17 DIAGNOSIS — Z419 Encounter for procedure for purposes other than remedying health state, unspecified: Secondary | ICD-10-CM | POA: Diagnosis not present

## 2024-07-26 ENCOUNTER — Ambulatory Visit: Admitting: *Deleted

## 2024-07-26 VITALS — BP 132/86 | HR 65 | Wt 208.0 lb

## 2024-07-26 DIAGNOSIS — Z3042 Encounter for surveillance of injectable contraceptive: Secondary | ICD-10-CM

## 2024-07-26 MED ORDER — MEDROXYPROGESTERONE ACETATE 150 MG/ML IM SUSY
150.0000 mg | PREFILLED_SYRINGE | Freq: Once | INTRAMUSCULAR | Status: AC
  Administered 2024-07-26: 150 mg via INTRAMUSCULAR

## 2024-07-26 NOTE — Progress Notes (Signed)
 Date last pap: 03/20/2021 Last Depo-Provera : 05/05/2024 Side Effects if any: None Serum HCG indicated? N/A Depo-Provera  150 mg IM given by: Morna Arenas, RN Next appointment due: 11-13 weeks

## 2024-10-14 ENCOUNTER — Other Ambulatory Visit: Payer: Self-pay | Admitting: Women's Health

## 2024-10-18 ENCOUNTER — Ambulatory Visit

## 2024-10-18 DIAGNOSIS — Z30013 Encounter for initial prescription of injectable contraceptive: Secondary | ICD-10-CM

## 2024-10-18 DIAGNOSIS — Z3042 Encounter for surveillance of injectable contraceptive: Secondary | ICD-10-CM

## 2024-10-18 MED ORDER — MEDROXYPROGESTERONE ACETATE 150 MG/ML IM SUSP
150.0000 mg | Freq: Once | INTRAMUSCULAR | Status: AC
  Administered 2024-10-18: 150 mg via INTRAMUSCULAR

## 2024-10-18 NOTE — Progress Notes (Signed)
" ° °  NURSE VISIT- INJECTION  SUBJECTIVE:  Monica Cox is a 34 y.o. [phone] female here for a Depo Provera  for contraception/period management. She is a GYN patient.   OBJECTIVE:  There were no vitals taken for this visit.  Appears well, in no apparent distress  Injection administered in: Left deltoid  Meds ordered this encounter  Medications   medroxyPROGESTERone  (DEPO-PROVERA ) injection 150 mg    ASSESSMENT: GYN patient Depo Provera  for contraception/period management PLAN: Follow-up: in 11-13 weeks for next Depo needs annual with next shot   Aleck FORBES Blase  10/18/2024 2:49 PM  "
